# Patient Record
Sex: Male | Born: 1950 | Race: Black or African American | Hispanic: No | Marital: Single | Smoking: Former smoker
Health system: Southern US, Community
[De-identification: ages and names within clinical notes are randomized; demographics above are authoritative.]

## PROBLEM LIST (undated history)

## (undated) DIAGNOSIS — E43 Unspecified severe protein-calorie malnutrition: Secondary | ICD-10-CM

## (undated) DIAGNOSIS — I428 Other cardiomyopathies: Secondary | ICD-10-CM

## (undated) DIAGNOSIS — Z789 Other specified health status: Secondary | ICD-10-CM

## (undated) DIAGNOSIS — I340 Nonrheumatic mitral (valve) insufficiency: Secondary | ICD-10-CM

## (undated) DIAGNOSIS — F1090 Alcohol use, unspecified, uncomplicated: Secondary | ICD-10-CM

## (undated) DIAGNOSIS — Z720 Tobacco use: Secondary | ICD-10-CM

## (undated) DIAGNOSIS — R05 Cough: Secondary | ICD-10-CM

## (undated) DIAGNOSIS — C159 Malignant neoplasm of esophagus, unspecified: Secondary | ICD-10-CM

## (undated) DIAGNOSIS — Z7289 Other problems related to lifestyle: Secondary | ICD-10-CM

## (undated) DIAGNOSIS — I5022 Chronic systolic (congestive) heart failure: Secondary | ICD-10-CM

## (undated) DIAGNOSIS — K861 Other chronic pancreatitis: Secondary | ICD-10-CM

## (undated) DIAGNOSIS — IMO0002 Reserved for concepts with insufficient information to code with codable children: Secondary | ICD-10-CM

## (undated) DIAGNOSIS — I2510 Atherosclerotic heart disease of native coronary artery without angina pectoris: Secondary | ICD-10-CM

## (undated) DIAGNOSIS — R9430 Abnormal result of cardiovascular function study, unspecified: Secondary | ICD-10-CM

## (undated) DIAGNOSIS — J449 Chronic obstructive pulmonary disease, unspecified: Secondary | ICD-10-CM

## (undated) HISTORY — DX: Chronic obstructive pulmonary disease, unspecified: J44.9

## (undated) HISTORY — PX: OTHER SURGICAL HISTORY: SHX169

## (undated) HISTORY — DX: Other specified health status: Z78.9

## (undated) HISTORY — DX: Chronic systolic (congestive) heart failure: I50.22

## (undated) HISTORY — DX: Malignant neoplasm of esophagus, unspecified: C15.9

## (undated) HISTORY — DX: Cough: R05

## (undated) HISTORY — DX: Other problems related to lifestyle: Z72.89

## (undated) HISTORY — DX: Abnormal result of cardiovascular function study, unspecified: R94.30

## (undated) HISTORY — DX: Other chronic pancreatitis: K86.1

## (undated) HISTORY — DX: Other cardiomyopathies: I42.8

## (undated) HISTORY — DX: Tobacco use: Z72.0

## (undated) HISTORY — DX: Atherosclerotic heart disease of native coronary artery without angina pectoris: I25.10

## (undated) HISTORY — DX: Reserved for concepts with insufficient information to code with codable children: IMO0002

## (undated) HISTORY — DX: Nonrheumatic mitral (valve) insufficiency: I34.0

## (undated) HISTORY — DX: Alcohol use, unspecified, uncomplicated: F10.90

---

## 1999-02-23 ENCOUNTER — Encounter: Payer: Self-pay | Admitting: Emergency Medicine

## 1999-02-24 ENCOUNTER — Encounter: Payer: Self-pay | Admitting: Family Medicine

## 1999-02-24 ENCOUNTER — Inpatient Hospital Stay (HOSPITAL_COMMUNITY): Admission: EM | Admit: 1999-02-24 | Discharge: 1999-02-25 | Payer: Self-pay | Admitting: Emergency Medicine

## 1999-03-06 ENCOUNTER — Encounter: Admission: RE | Admit: 1999-03-06 | Discharge: 1999-03-06 | Payer: Self-pay | Admitting: Family Medicine

## 1999-03-22 ENCOUNTER — Encounter: Admission: RE | Admit: 1999-03-22 | Discharge: 1999-03-22 | Payer: Self-pay | Admitting: Family Medicine

## 1999-03-29 ENCOUNTER — Encounter: Admission: RE | Admit: 1999-03-29 | Discharge: 1999-03-29 | Payer: Self-pay | Admitting: Family Medicine

## 1999-04-23 ENCOUNTER — Encounter: Admission: RE | Admit: 1999-04-23 | Discharge: 1999-04-23 | Payer: Self-pay | Admitting: Family Medicine

## 1999-05-08 ENCOUNTER — Encounter: Admission: RE | Admit: 1999-05-08 | Discharge: 1999-05-08 | Payer: Self-pay | Admitting: Sports Medicine

## 1999-07-13 ENCOUNTER — Encounter: Admission: RE | Admit: 1999-07-13 | Discharge: 1999-07-13 | Payer: Self-pay | Admitting: Family Medicine

## 2000-06-11 ENCOUNTER — Encounter: Admission: RE | Admit: 2000-06-11 | Discharge: 2000-06-11 | Payer: Self-pay | Admitting: Family Medicine

## 2008-04-05 ENCOUNTER — Encounter: Admission: RE | Admit: 2008-04-05 | Discharge: 2008-04-05 | Payer: Self-pay | Admitting: Internal Medicine

## 2009-09-22 ENCOUNTER — Emergency Department (HOSPITAL_COMMUNITY): Admission: EM | Admit: 2009-09-22 | Discharge: 2009-09-22 | Payer: Self-pay | Admitting: Family Medicine

## 2009-09-29 ENCOUNTER — Emergency Department (HOSPITAL_COMMUNITY): Admission: EM | Admit: 2009-09-29 | Discharge: 2009-09-29 | Payer: Self-pay | Admitting: Family Medicine

## 2009-09-29 DIAGNOSIS — I428 Other cardiomyopathies: Secondary | ICD-10-CM

## 2009-09-29 HISTORY — DX: Other cardiomyopathies: I42.8

## 2009-09-29 HISTORY — PX: CARDIAC CATHETERIZATION: SHX172

## 2009-09-30 ENCOUNTER — Encounter: Payer: Self-pay | Admitting: Cardiology

## 2009-09-30 ENCOUNTER — Inpatient Hospital Stay (HOSPITAL_COMMUNITY): Admission: EM | Admit: 2009-09-30 | Discharge: 2009-10-04 | Payer: Self-pay | Admitting: Emergency Medicine

## 2009-09-30 ENCOUNTER — Ambulatory Visit: Payer: Self-pay | Admitting: Cardiology

## 2009-10-24 ENCOUNTER — Inpatient Hospital Stay (HOSPITAL_COMMUNITY)
Admission: EM | Admit: 2009-10-24 | Discharge: 2009-10-28 | Payer: Self-pay | Source: Home / Self Care | Admitting: Emergency Medicine

## 2009-10-30 ENCOUNTER — Telehealth: Payer: Self-pay | Admitting: Cardiology

## 2009-10-31 ENCOUNTER — Telehealth (INDEPENDENT_AMBULATORY_CARE_PROVIDER_SITE_OTHER): Payer: Self-pay | Admitting: *Deleted

## 2009-11-23 ENCOUNTER — Encounter: Payer: Self-pay | Admitting: Cardiology

## 2009-11-24 ENCOUNTER — Ambulatory Visit: Payer: Self-pay | Admitting: Cardiology

## 2009-12-15 ENCOUNTER — Ambulatory Visit: Payer: Self-pay | Admitting: Cardiology

## 2010-01-22 ENCOUNTER — Telehealth: Payer: Self-pay | Admitting: Cardiology

## 2010-01-22 ENCOUNTER — Encounter: Admission: RE | Admit: 2010-01-22 | Discharge: 2010-01-22 | Payer: Self-pay | Admitting: General Surgery

## 2010-02-13 ENCOUNTER — Ambulatory Visit: Payer: Self-pay | Admitting: Cardiology

## 2010-02-21 ENCOUNTER — Ambulatory Visit (HOSPITAL_COMMUNITY): Admission: RE | Admit: 2010-02-21 | Discharge: 2010-02-21 | Payer: Self-pay | Admitting: Gastroenterology

## 2010-03-12 ENCOUNTER — Encounter: Payer: Self-pay | Admitting: Cardiology

## 2010-03-13 ENCOUNTER — Telehealth (INDEPENDENT_AMBULATORY_CARE_PROVIDER_SITE_OTHER): Payer: Self-pay | Admitting: *Deleted

## 2010-03-31 DIAGNOSIS — E8770 Fluid overload, unspecified: Secondary | ICD-10-CM | POA: Insufficient documentation

## 2010-04-02 ENCOUNTER — Encounter: Payer: Self-pay | Admitting: Cardiology

## 2010-04-02 ENCOUNTER — Emergency Department (HOSPITAL_COMMUNITY): Admission: EM | Admit: 2010-04-02 | Discharge: 2010-04-02 | Payer: Self-pay | Admitting: Emergency Medicine

## 2010-04-05 ENCOUNTER — Telehealth: Payer: Self-pay | Admitting: Cardiology

## 2010-04-17 ENCOUNTER — Ambulatory Visit: Payer: Self-pay | Admitting: Cardiology

## 2010-04-19 ENCOUNTER — Telehealth (INDEPENDENT_AMBULATORY_CARE_PROVIDER_SITE_OTHER): Payer: Self-pay | Admitting: *Deleted

## 2010-04-23 ENCOUNTER — Telehealth: Payer: Self-pay | Admitting: Cardiology

## 2010-04-25 ENCOUNTER — Ambulatory Visit: Payer: Self-pay | Admitting: Cardiology

## 2010-04-25 ENCOUNTER — Telehealth: Payer: Self-pay | Admitting: Cardiology

## 2010-04-25 ENCOUNTER — Encounter: Payer: Self-pay | Admitting: Cardiology

## 2010-04-25 ENCOUNTER — Inpatient Hospital Stay (HOSPITAL_COMMUNITY)
Admission: EM | Admit: 2010-04-25 | Discharge: 2010-05-02 | Payer: Self-pay | Source: Home / Self Care | Admitting: Emergency Medicine

## 2010-04-26 ENCOUNTER — Encounter (INDEPENDENT_AMBULATORY_CARE_PROVIDER_SITE_OTHER): Payer: Self-pay | Admitting: Internal Medicine

## 2010-04-26 ENCOUNTER — Ambulatory Visit: Payer: Self-pay

## 2010-05-02 ENCOUNTER — Encounter: Payer: Self-pay | Admitting: Cardiology

## 2010-05-17 ENCOUNTER — Telehealth (INDEPENDENT_AMBULATORY_CARE_PROVIDER_SITE_OTHER): Payer: Self-pay | Admitting: *Deleted

## 2010-05-18 ENCOUNTER — Ambulatory Visit: Payer: Self-pay | Admitting: Cardiology

## 2010-06-08 ENCOUNTER — Telehealth: Payer: Self-pay | Admitting: Cardiology

## 2010-06-10 ENCOUNTER — Encounter: Payer: Self-pay | Admitting: Cardiology

## 2010-06-18 ENCOUNTER — Ambulatory Visit: Payer: Self-pay | Admitting: Cardiology

## 2010-06-21 ENCOUNTER — Encounter (INDEPENDENT_AMBULATORY_CARE_PROVIDER_SITE_OTHER): Payer: Self-pay | Admitting: *Deleted

## 2010-06-27 ENCOUNTER — Telehealth (INDEPENDENT_AMBULATORY_CARE_PROVIDER_SITE_OTHER): Payer: Self-pay | Admitting: *Deleted

## 2010-06-29 ENCOUNTER — Telehealth: Payer: Self-pay | Admitting: Cardiology

## 2010-07-01 DIAGNOSIS — R05 Cough: Secondary | ICD-10-CM | POA: Insufficient documentation

## 2010-07-01 DIAGNOSIS — R059 Cough, unspecified: Secondary | ICD-10-CM

## 2010-07-01 HISTORY — DX: Cough, unspecified: R05.9

## 2010-07-23 ENCOUNTER — Ambulatory Visit
Admission: RE | Admit: 2010-07-23 | Discharge: 2010-07-23 | Payer: Self-pay | Source: Home / Self Care | Attending: Cardiology | Admitting: Cardiology

## 2010-08-02 NOTE — Assessment & Plan Note (Signed)
Summary: Michael Keith   Visit Type:  Follow-up Primary Provider:  none  CC:  CHF.  History of Present Illness: The patient is seen for followup of chronic systolic CHF.  He actually looks better today than he has a long time.  He's gained from body weight.  He has been taking his medicines.  He has a cough that may be from the enalapril.  Current Medications (verified): 1)  Carvedilol 12.5 Mg Tabs (Carvedilol) .... Take One Tablet By Mouth Twice A Day 2)  Aspirin 81 Mg Tbec (Aspirin) .... Take One Tablet By Mouth Daily 3)  Enalapril Maleate 5 Mg Tabs (Enalapril Maleate) .... Take One Tablet By Mouth Twice A Day 4)  Nitrostat 0.4 Mg Subl (Nitroglycerin) .Marland Kitchen.. 1 Tablet Under Tongue At Onset of Chest Pain; You May Repeat Every 5 Minutes For Up To 3 Doses. 5)  Multivitamins   Tabs (Multiple Vitamin) .... Once Daily 6)  Furosemide 80 Mg Tabs (Furosemide) .... Take One Tablet By Mouth Two Times A Day 7)  Vitamin B-1 100 Mg Tabs (Thiamine Hcl) .... Once Daily 8)  Magnesium Oxide 400 Mg Tabs (Magnesium Oxide) .... Take 1 Tablet By Mouth Two Times A Day  Allergies (verified): No Known Drug Allergies  Past History:  Past Medical History:   Chronic pancreatitis.  CT scan shows improving pancreatitis. ..MCH.... treated at a hospital EF  35-40%..... echo.  [id number].Marland Kitchen Nonischemic cardiomyopathy...Marland KitchenMarland KitchenMarland Kitchen catheterization.... April, 2011 CHF....[facility]   April, 2011.[email or web address] [email or web address] troponin elevation at that time Mitral regurgitation   mild.... echo... April, 2011 Tobacco abuse Alcohol use..... heavy until January, 2011.Marland KitchenMarland Kitchenpatient was counseled concerning alcohol..MCH.Marland Kitchen April, 2011 COPD Cardiac catheterization.... April, 2011..... questionable occlusion of the most apical portion of the LAD... LV dysfunction out of proportion to coronary disease fluid overload   October, 2011 Cough    January, 2012... may be from enalapril.  Review of Systems       Patient denies fever, chills, headache, sweats, rash, change in  vision, change in hearing, chest pain, cough, nausea vomiting, urinary symptoms.  All of the systems are reviewed and are negative.  Vital Signs:  Patient profile:   60 year old male Height:      66 inches Weight:      105 pounds BMI:     17.01 Pulse rate:   85 / minute BP sitting:   112 / 66  (left arm) Cuff size:   regular  Vitals Entered By: Hardin Negus, RMA (July 23, 2010 3:51 PM)  Physical Exam  General:  patient looks better than usual today. Eyes:  no xanthelasma. Neck:  no jugular venous distention. Lungs:  lungs reveal a few scattered rhonchi. Heart:  cardiac exam reveals S1-S2.  No clicks or significant murmurs. Abdomen:  abdomen is soft. Extremities:  1+ peripheral edema. Psych:  patient is oriented to person time and place.  Affect is normal.   Impression & Recommendations:  Problem # 1:  COUGH (ICD-786.2) And is possible that his cough is related to his ACE inhibitor.  We will change him to low-dose Cozaar after his current medicines run out.  Problem # 2:  ALCOHOL ABUSE (ICD-305.00) The patient has stopped drinking completely for a prolonged period of time.  Problem # 3:  CONGESTIVE HEART FAILURE, UNSPECIFIED (ICD-428.0)  His updated medication list for this problem includes:    Carvedilol 12.5 Mg Tabs (Carvedilol) .Marland Kitchen... Take one tablet by mouth twice a day    Aspirin 81 Mg Tbec (Aspirin) .Marland Kitchen... Take one tablet by mouth  daily    Enalapril Maleate 5 Mg Tabs (Enalapril maleate) .Marland Kitchen... Take one tablet by mouth twice a day    Nitrostat 0.4 Mg Subl (Nitroglycerin) .Marland Kitchen... 1 tablet under tongue at onset of chest pain; you may repeat every 5 minutes for up to 3 doses.    Furosemide 80 Mg Tabs (Furosemide) .Marland Kitchen... Take one tablet by mouth two times a day    Cozaar 50 Mg Tabs (Losartan potassium) .Marland Kitchen... Take one tablet by mouth daily The patient's overall volume status is stable.  No change in therapy.  Patient Instructions: 1)  Your physician has recommended you  make the following change in your medication: start Cozaar (Losartan) 50mg  once daily after finishing enalapril 2)  Your physician recommends that you schedule a follow-up appointment in: 6 months Prescriptions: COZAAR 50 MG TABS (LOSARTAN POTASSIUM) Take one tablet by mouth daily  #90 x 2   Entered by:   Meredith Staggers, RN   Authorized by:   Talitha Givens, MD, [facility]   Signed by:   Meredith Staggers, RN on 07/23/2010   Method used:   Print then Give to Patient   RxID:   [id number]

## 2010-08-02 NOTE — Assessment & Plan Note (Signed)
Summary: per check out/sf   Visit Type:  post hospitalization Primary Provider:  Debarah Crape, MD  CC:  cardiomyopathy.  History of Present Illness: The patient is seen post hospitalization.  He was admitted with acute on chronic systolic heart failure.  He was managed well.  I have reviewed all the hospital records.  He has severe left ventricular dysfunction.  He also has a problem with malnutrition.  His ejection fraction is in the range of 15%.  Very careful plans for made with his discharge medicines.  Keeping him on these medicines his critical in our attempts to keep him stable and out of the hospital.  He says that home health has helped him but that he does not have any medicines to take for tomorrow.  Allergies (verified): No Known Drug Allergies  Past History:  Past Medical History: Last updated: 04/17/2010   Chronic pancreatitis.  CT scan shows improving pancreatitis. ..MCH.... treated at a hospital EF  35-40%..... echo.  [id number].Marland Kitchen Nonischemic cardiomyopathy...Marland KitchenMarland KitchenMarland Kitchen catheterization.... April, 2011 and aa CHF....[facility]   April, 2011.[email or web address] [email or web address] troponin elevation at that time Mitral regurgitation   mild.... echo... April, 2011 Tobacco abuse Alcohol use..... heavy until January, 2011.Marland KitchenMarland Kitchenpatient was counseled concerning alcohol..MCH.Marland Kitchen April, 2011 COPD Cardiac catheterization.... April, 2011..... questionable occlusion of the most apical portion of the LAD... LV dysfunction out of proportion to coronary disease fluid overload   October, 2011  Review of Systems       Patient denies fever, chills, headache, sweats, rash, change in vision, change in hearing, chest pain, cough, nausea vomiting, urinary symptoms.  All the systems are reviewed and are negative.  Vital Signs:  Patient profile:   60 year old male Height:      66 inches Weight:      94 pounds BMI:     15.23 Pulse rate:   65 / minute BP sitting:   94 / 68  (left arm) Cuff size:   regular  Vitals Entered By:  Hardin Negus, RMA (May 18, 2010 11:45 AM)  Physical Exam  General:  patient is cachectic but stable Head:  . Eyes:  no xanthelasma. Neck:  no jugular venous distention. Lungs:  lungs are clear cardiorespiratory effort is not labored. Heart:  cardiac exam reveals S1-S2.  No clicks or significant murmurs. Abdomen:  abdomen is soft. Msk:  patient is thin. Extremities:  no peripheral edema. Psych:  patient is oriented to person, place.  Affect is normal.   Impression & Recommendations:  Problem # 1:  CARDIOMYOPATHY (ICD-425.4)  His updated medication list for this problem includes:    Metoprolol Succinate 25 Mg Xr24h-tab (Metoprolol succinate) .Marland Kitchen... Take one tablet by mouth daily    Aspirin 81 Mg Tbec (Aspirin) .Marland Kitchen... Take one tablet by mouth daily    Enalapril Maleate 5 Mg Tabs (Enalapril maleate) .Marland Kitchen... Take one tablet by mouth twice a day    Nitrostat 0.4 Mg Subl (Nitroglycerin) .Marland Kitchen... 1 tablet under tongue at onset of chest pain; you may repeat every 5 minutes for up to 3 doses.    Furosemide 40 Mg Tabs (Furosemide) .Marland Kitchen... Take one tablet by mouth daily. The patient has a severe cardiopathy.  Being on his medications is crucial.  We will do everything we can today to try to be sure of that home health is working with him.  Problem # 2:  CONGESTIVE HEART FAILURE, UNSPECIFIED (ICD-428.0)  His updated medication list for this problem includes:    Metoprolol Succinate 25 Mg Xr24h-tab (Metoprolol succinate) .Marland Kitchen... Take  one tablet by mouth daily    Aspirin 81 Mg Tbec (Aspirin) .Marland Kitchen... Take one tablet by mouth daily    Enalapril Maleate 5 Mg Tabs (Enalapril maleate) .Marland Kitchen... Take one tablet by mouth twice a day    Nitrostat 0.4 Mg Subl (Nitroglycerin) .Marland Kitchen... 1 tablet under tongue at onset of chest pain; you may repeat every 5 minutes for up to 3 doses.    Furosemide 40 Mg Tabs (Furosemide) .Marland Kitchen... Take one tablet by mouth daily. His volume status is stable today.  No change in  therapy.  Patient Instructions: 1)  Follow up in 6 weeks

## 2010-08-02 NOTE — Progress Notes (Signed)
Summary: fluid in legs and feet  Phone Note Call from Patient Call back at Home Phone [phone] Call back at [phone]   Caller: Onalee Hua Summary of Call: Fluid legs and feet Initial call taken by: Judie Grieve,  April 23, 2010 1:45 PM  Follow-up for Phone Call        Pt.'s ankles and legs are swollen bilaterally. Pt. is not c/o any SOB/cp.  Pt. 99lb.last week & is not able to stand and weigh himself at home. Pt. can not bear any weight on his legs at the current time. Spoke to Dr.Allred. He wants pt. to take 40 mg of Lasix two times a day for three days & to decrease his salt/fluid intake. Pt. will go to the ER if he starts to become SOB. He will come to the office on Thursday for a BMP & I will forward this to Dr. Henrietta Hoover RN to see if they can add him on for this Thursday afternoon.  Whitney Maeola Sarah RN  April 23, 2010 2:52 PM  Follow-up by: Whitney Maeola Sarah RN,  April 23, 2010 2:01 PM

## 2010-08-02 NOTE — Progress Notes (Signed)
  DDS request received sent to [facility]  May 17, 2010 10:04 AM     Appended Document:  pt also signed ROI,sent to [facility]

## 2010-08-02 NOTE — Letter (Signed)
Summary: [facility]  [facility]   Imported By: Roderic Ovens 05/17/2010 12:22:08  _____________________________________________________________________  External Attachment:    Type:   Image     Comment:   External Document

## 2010-08-02 NOTE — Assessment & Plan Note (Signed)
Summary: rov   Visit Type:  Follow-up Primary Cortez Steelman:  [name]  CC:  fluid overload.  History of Present Illness: The patient is seen for cardiology followup.  I received a call if you've been in the emergency room last week.  He had some mild fluid overload at that time.  Since being at home he has not had chest pain.  However he now has significant swelling in his feet.  He may have mild orthopnea.  He does have decreased ejection fraction by echo in April, 2011.  Current Medications (verified): 1)  Metoprolol Succinate 25 Mg Xr24h-Tab (Metoprolol Succinate) .... Take One Tablet By Mouth Daily 2)  Aspirin 81 Mg Tbec (Aspirin) .... Take One Tablet By Mouth Daily 3)  Enalapril Maleate 5 Mg Tabs (Enalapril Maleate) .... Take One Tablet By Mouth Twice A Day 4)  Nitrostat 0.4 Mg Subl (Nitroglycerin) .Marland Kitchen.. 1 Tablet Under Tongue At Onset of Chest Pain; You May Repeat Every 5 Minutes For Up To 3 Doses. 5)  Multivitamins   Tabs (Multiple Vitamin) .... Once Daily 6)  Zenpep 20000 Unit Cpep (Pancrelipase (Lip-Prot-Amyl)) .... 2 Caps Three Times A Day  Allergies (verified): No Known Drug Allergies  Past History:  Past Medical History:   Chronic pancreatitis.  CT scan shows improving pancreatitis. ..MCH.... treated at a hospital EF  35-40%..... echo.  [id number].Marland Kitchen Nonischemic cardiomyopathy...Marland KitchenMarland KitchenMarland Kitchen catheterization.... April, 2011 and aa CHF....[facility]   April, 2011.[email or web address] [email or web address] troponin elevation at that time Mitral regurgitation   mild.... echo... April, 2011 Tobacco abuse Alcohol use..... heavy until January, 2011.Marland KitchenMarland Kitchenpatient was counseled concerning alcohol..MCH.Marland Kitchen April, 2011 COPD Cardiac catheterization.... April, 2011..... questionable occlusion of the most apical portion of the LAD... LV dysfunction out of proportion to coronary disease fluid overload   October, 2011  Review of Systems       Patient denies fever, chills, headache, sweats, rash, change in vision, change in hearing, chest  pain, cough, nausea vomiting, urinary symptoms.  All other systems are reviewed and are negative.  Vital Signs:  Patient profile:   60 year old male Height:      66 inches Weight:      99 pounds BMI:     16.04 Pulse rate:   96 / minute BP sitting:   108 / 70  (right arm) Cuff size:   regular  Vitals Entered By: Hardin Negus, RMA (April 17, 2010 10:28 AM)  Physical Exam  General:  patient is thin and frail but stable. Head:  head is atraumatic. Eyes:  normal extraocular motion. Neck:  no jugular venous distention. Chest Wall:  no chest wall tenderness. Lungs:  lungs reveal scattered rhonchi. Heart:  cardiac exam reveals an S1-S2.  There is a soft systolic murmur. Abdomen:  abdomen is soft. Msk:  no areas of trauma. Extremities:  patient has 2+ edema localized to his feet. Skin:  no skin rashes. Psych:  patient is oriented to person time and place.  Affect is normal.   Impression & Recommendations:  Problem # 1:  * OCCLUSION APICAL LAD  ???????  SMALL The patient is not having chest pain.  No further workup.  Problem # 2:  ALCOHOL ABUSE (ICD-305.00) The patient is not using alcohol at this time.  Problem # 3:  CARDIOMYOPATHY (ICD-425.4)  His updated medication list for this problem includes:    Metoprolol Succinate 25 Mg Xr24h-tab (Metoprolol succinate) .Marland Kitchen... Take one tablet by mouth daily    Aspirin 81 Mg Tbec (Aspirin) .Marland Kitchen... Take one tablet by mouth daily  Enalapril Maleate 5 Mg Tabs (Enalapril maleate) .Marland Kitchen... Take one tablet by mouth twice a day    Nitrostat 0.4 Mg Subl (Nitroglycerin) .Marland Kitchen... 1 tablet under tongue at onset of chest pain; you may repeat every 5 minutes for up to 3 doses.    Furosemide 40 Mg Tabs (Furosemide) .Marland Kitchen... Take one tablet by mouth daily. The patient does have decreased ejection fraction.  He is on appropriate medications at this point.  Problem # 4:  CONGESTIVE HEART FAILURE, UNSPECIFIED (ICD-428.0)  His updated medication list for this  problem includes:    Metoprolol Succinate 25 Mg Xr24h-tab (Metoprolol succinate) .Marland Kitchen... Take one tablet by mouth daily    Aspirin 81 Mg Tbec (Aspirin) .Marland Kitchen... Take one tablet by mouth daily    Enalapril Maleate 5 Mg Tabs (Enalapril maleate) .Marland Kitchen... Take one tablet by mouth twice a day    Nitrostat 0.4 Mg Subl (Nitroglycerin) .Marland Kitchen... 1 tablet under tongue at onset of chest pain; you may repeat every 5 minutes for up to 3 doses.    Furosemide 40 Mg Tabs (Furosemide) .Marland Kitchen... Take one tablet by mouth daily. currently he is not on a diuretic.  This will be started and we'll see him for followup.  His labs will have to be rechecked.  I have reviewed his emergency room records.  I has spoken with the gentleman who is here with him.  Patient Instructions: 1)  Start Furosemide 40mg  daily 2)  Follow up in 3 weeks Prescriptions: FUROSEMIDE 40 MG TABS (FUROSEMIDE) Take one tablet by mouth daily.  #30 x 6   Entered by:   Meredith Staggers, RN   Authorized by:   Talitha Givens, MD, [facility]   Signed by:   Meredith Staggers, RN on 04/17/2010   Method used:   Electronically to        [facility] [address] (retail)       [address]       [place], [place]  [id number]       Ph: [phone]       Fax: [phone]   RxID:   [id number]

## 2010-08-02 NOTE — Progress Notes (Signed)
Summary: Home Health  Phone Note From Other Clinic Call back at [phone]   Caller: [facility] Summary of Call: needs order for skilled nursing visit & Aide, states dc papers state Dr Myrtis Ser is MD Initial call taken by: Migdalia Dk,  Oct 30, 2009 4:02 PM  Follow-up for Phone Call        spoke w/Mary Waynetta Sandy gave ok for nurse and aid, she states pt is missing some meds and is unsure about chf diagnosis Meredith Staggers, RN  Oct 30, 2009 4:36 PM

## 2010-08-02 NOTE — Letter (Signed)
Summary: [place] Physician Documentation Sheet   [facility] Physician Documentation Sheet   Imported By: Roderic Ovens 04/24/2010 09:17:38  _____________________________________________________________________  External Attachment:    Type:   Image     Comment:   External Document

## 2010-08-02 NOTE — Assessment & Plan Note (Signed)
Summary: per check out/sf   Visit Type:  Follow-up Primary Provider:  [name]  CC:  cardiomyopathy.  History of Present Illness: Patient is seen in followup of cardiomyopathy.  Catheterization had been done in April, 2011.  There was question of occlusion of the most apical portion of the LAD.  There is significant left ventricular dysfunction out of proportion to the coronary disease.  Ejection fraction in the hospital had been in the 35-40% range.  When I saw the patient on Nov 24, 2009 I did try to titrate his medications hoping to increase his beta blocker.  His blood pressure was 110 systolic at that time.  Since then he has run out of his furosemide, metoprolol, aspirin, iron, Prilosec.  It appears that he is taking potassium, enalapril, folic acid, multivitamin.  He says he feels weak.  His blood pressure is low today.  He does not appear to be wet despite the fact that he has run out of Lasix.  It may be that some of his symptoms are related to low blood pressure.  Current Medications (verified): 1)  Furosemide 40 Mg Tabs (Furosemide) .... Take One Tablet By Mouth Daily. 2)  Metoprolol Tartrate 50 Mg Tabs (Metoprolol Tartrate) .... Take One Tablet By Mouth Twice A Day 3)  Potassium Chloride Crys Cr 20 Meq Cr-Tabs (Potassium Chloride Crys Cr) .... Take One Tablet By Mouth Daily 4)  Aspirin 81 Mg Tbec (Aspirin) .... Take One Tablet By Mouth Daily 5)  Enalapril Maleate 10 Mg Tabs (Enalapril Maleate) .... Take One Tablet By Mouth Twice A Day 6)  Nitrostat 0.4 Mg Subl (Nitroglycerin) .Marland Kitchen.. 1 Tablet Under Tongue At Onset of Chest Pain; You May Repeat Every 5 Minutes For Up To 3 Doses. 7)  Folic Acid 1 Mg Tabs (Folic Acid) .... Once Daily 8)  Ferrous Sulfate 325 (65 Fe) Mg  Tabs (Ferrous Sulfate) .... Once Daily 9)  Multivitamins   Tabs (Multiple Vitamin) .... Once Daily 10)  Prilosec 40 Mg Cpdr (Omeprazole) .... Once Daily 11)  Ex-Lax 15 Mg Tabs (Sennosides) .... As Needed 12)  Vitamin B-1  100 Mg Tabs (Thiamine Hcl) .... Take One Tablet By Mouth Once Daily.  Allergies (verified): No Known Drug Allergies  Past History:  Past Surgical History: Last updated: 11/14/2009 wrist -- Left  Family History: Last updated: 2006/09/23 Mother died of Cancer at young age. Father`s med hx., Unknown. One healthy brother. Cousins with DM.  Social History: Last updated: 11/14/2009 Single  Tobacco Use - Yes.  Alcohol Use - yes -- heavy till Jan '11 Drug Use - no  Risk Factors: Smoking Status: current (11/14/2009)  Past Medical History:   Chronic pancreatitis.  CT scan shows improving pancreatitis. ..MCH.... treated at a hospital EF  35-40%..... echo.  [id number].Marland Kitchen Nonischemic cardiomyopathy...Marland KitchenMarland KitchenMarland Kitchen catheterization.... April, 2011 and aa CHF....[facility]   April, 2011.[email or web address] [email or web address] troponin elevation at that time Mitral regurgitation   mild.... echo... April, 2011 Tobacco abuse Alcohol use..... heavy until January, 2011.Marland KitchenMarland Kitchenpatient was counseled concerning alcohol..MCH.Marland Kitchen April, 2011 COPD Cardiac catheterization.... April, 2011..... questionable occlusion of the most apical portion of the LAD... LV dysfunction out of proportion to coronary disease  Review of Systems       Patient denies fever, chills, headache, sweats, rash, change in vision, change in hearing, chest pain, nausea vomiting, urinary symptoms.  He says that he has mild nasal discharge from the right nares intermittently.  All the systems are reviewed and are negative.  Vital Signs:  Patient profile:   60 year  old male Height:      66 inches Weight:      87 pounds BMI:     14.09 Pulse rate:   88 / minute Pulse rhythm:   regular BP sitting:   90 / 72  (left arm) Cuff size:   regular  Vitals Entered By: Stanton Kidney, EMT-P (December 15, 2009 1:36 PM)  Physical Exam  General:  in general he is thin and weak appearing Eyes:  no xanthelasma. Neck:  no jugular venous distention. Lungs:  lungs reveal scattered rhonchi. Heart:   cardiac exam reveals S1-S2.  There is a soft systolic murmur. Abdomen:  abdomen is soft. Extremities:  no significant peripheral edema. Psych:  patient is oriented to person time and place.   Impression & Recommendations:  Problem # 1:  * OCCLUSION APICAL LAD  ???????  SMALL No further workup of his coronary disease is needed.  Problem # 2:  ALCOHOL ABUSE (ICD-305.00) I am hopeful that by remaining off alcohol and he will improve in general.  Problem # 3:  TOBACCO ABUSE (ICD-305.1) Unfortunately he continues to smoke and we counseled him to stop.  Problem # 4:  CARDIOMYOPATHY (ICD-425.4) It is possible that as we attempt to treat the ejection fraction seen in the hospital that he is feeling poorly from some of the medicines.  In this case I feel it is most prudent to back off and simplify his meds and allow his blood pressure to drift up at least into the range of 110 systolic.  It appears to me that he may not need ongoing furosemide.  Therefore furosemide will be stopped.  Metoprolol will be continued at 25 mg of metoprolol succinate daily.  Potassium he stopped.  Aspirin will be continued.  Enalapril will be decreased to 5 mg b.i.d.  Folic acid will be continued.  Continuation of any other medications will be up to his primary physician.  I will see him back for cardiology followup.  Patient Instructions: 1)  Stop Furosemide 2)  Stop Potassium 3)  Stop Metoprolol Tartrate  4)  Start Metoprolol Succinate 25mg  once daily  5)  Decrease Enalapril to 5mg  two times a day  6)  Restart Aspirin 81mg  once daily  7)  Continue Folic Acid 8)  All other medications can be stopped when they run out 9)  Follow up in 6 weeks Prescriptions: ENALAPRIL MALEATE 5 MG TABS (ENALAPRIL MALEATE) Take one tablet by mouth twice a day  #60 x 12   Entered by:   Meredith Staggers, RN   Authorized by:   Talitha Givens, MD, [facility]   Signed by:   Meredith Staggers, RN on 12/15/2009   Method used:   Electronically to          [facility] (retail)       [address]       [place], [place]  [id number]       Ph: [phone]       Fax: [phone]   RxID:   [id number] METOPROLOL SUCCINATE 25 MG XR24H-TAB (METOPROLOL SUCCINATE) Take one tablet by mouth daily  #30 x 12   Entered by:   Meredith Staggers, RN   Authorized by:   Talitha Givens, MD, [facility] [facility]   Signed by:   Meredith Staggers, RN on 12/15/2009   Method used:   Electronically to        [facility] (retail)       [address]       [place], [place]  [id number]       Ph: [phone]       Fax: [phone]   RxID:   [id number]

## 2010-08-02 NOTE — Letter (Signed)
Summary: Appointment - Missed  [facility], Main Office  [address]   [place], [place] [address]   Phone: [phone]  Fax: [phone]     June 21, 2010 MRN: [id number]   MORTY ORTWEIN [address] [place], [place]  [address]   Dear Mr. Scarberry,  Our records indicate you missed your appointment on  06/18/10 with Dr. Myrtis Ser .It is very important that we reach you to reschedule this appointment. We look forward to participating in your health care needs. Please contact us at the number listed above at your earliest convenience to reschedule this appointment.     Sincerely,  Artist

## 2010-08-02 NOTE — Letter (Signed)
Summary: ER Notification  Architectural technologist, Main Office  [address]   [place], [place] [address]   Phone: [phone]  Fax: [phone]    April 25, 2010 10:22 AM  [place] KENTNER  The above referenced patient has been advised to report directly to the Emergency Room. Please see below for more information:  Dx: LE edema. (pt has doubled lasix for the past few days & still can't bear weight on his legs.)   Private Vehicle  _____X________ or EMS:  ________________   Orders:  Yes ______ or No  ____X___   Notify upon arrival:     Trish [phone]- done       Thank you,    Forensic psychologist

## 2010-08-02 NOTE — Assessment & Plan Note (Signed)
Summary: Michael Keith   Visit Type:  Follow-up Primary Provider:  [name]   History of Present Illness: The patient is seen post hospitalization followup left ventricular dysfunction.  He has chronic pancreatitis.  Is not able to gain weight.  When he was hospitalized recently he had some troponin elevation area his ejection fraction is 35-40%.  Cardiac catheterization was done there he there was question of an occlusion of the most apical aspect of the LAD.  Left ventricular dysfunction was out of proportion to the coronary disease.  Patient returns today feeling generally poorly.  His cardiac status appears to be stable.he says that he has edema.  Current Medications (verified): 1)  Furosemide 40 Mg Tabs (Furosemide) .... Take One Tablet By Mouth Daily. 2)  Metoprolol Tartrate 25 Mg Tabs (Metoprolol Tartrate) .... Take One Tablet By Mouth Twice A Day 3)  Potassium Chloride Crys Cr 20 Meq Cr-Tabs (Potassium Chloride Crys Cr) .... Take One Tablet By Mouth Daily 4)  Aspirin 81 Mg Tbec (Aspirin) .... Take One Tablet By Mouth Daily 5)  Enalapril Maleate 10 Mg Tabs (Enalapril Maleate) .... Take One Tablet By Mouth Twice A Day 6)  Nitrostat 0.4 Mg Subl (Nitroglycerin) .Marland Kitchen.. 1 Tablet Under Tongue At Onset of Chest Pain; You May Repeat Every 5 Minutes For Up To 3 Doses. 7)  Folic Acid 1 Mg Tabs (Folic Acid) .... Once Daily 8)  Ferrous Sulfate 325 (65 Fe) Mg  Tabs (Ferrous Sulfate) .... Once Daily 9)  Multivitamins   Tabs (Multiple Vitamin) .... Once Daily 10)  Prilosec 40 Mg Cpdr (Omeprazole) .... Once Daily 11)  Ex-Lax 15 Mg Tabs (Sennosides) .... As Needed 12)  Vitamin B-1 100 Mg Tabs (Thiamine Hcl) .... Take One Tablet By Mouth Once Daily.  Allergies (verified): No Known Drug Allergies  Past History:  Past Medical History: Last updated: 11/23/2009   Chronic pancreatitis.  CT scan shows improving pancreatitis. ..MCH.... treated at a hospital EF  35-40%..... echo.  [id number].Marland Kitchen Nonischemic  cardiomyopathy...Marland KitchenMarland KitchenMarland Kitchen catheterization.... April, 2011 and aa CHF....[facility]   April, 2011.[email or web address] [email or web address] troponin elevation at that time Mitral regurgitation   mild.... echo... April, 2011 Tobacco abuse Alcohol use..... heavy until January, 2011.Marland KitchenMarland Kitchenpatient was counseled concerning alcohol..MCH.Marland Kitchen April, 2011 COPD Cardiac catheterization.... April, 2011..... questionable occlusion of the most apical portion of the LAD... LV dysfunction out of proportion to coronary disease  Review of Systems       Patient denies fever, chills, headache, sweats, rash, change in vision, change in hearing, chest pain, nausea vomiting, urinary symptoms.  He does have some arthritic symptoms.All of the systems are reviewed and are negative.  Vital Signs:  Patient profile:   60 year old male Height:      66 inches Weight:      91 pounds BMI:     14.74 Pulse rate:   109 / minute BP sitting:   98 / 52  (left arm)  Vitals Entered By: Laurance Flatten CMA (Nov 24, 2009 1:56 PM)  Physical Exam  General:  patient is thin but stable. Eyes:  no xanthelasma. Neck:  no jugular venous distention. Lungs:  lungs are clear.  Respiratory effort is nonlabored. Heart:  cardiac exam reveals S1 and S2.  No clicks or significant murmurs.  His heart rate is increased. Abdomen:  abdomen is soft. Extremities:  trace peripheral edema in the right foot. Psych:  patient is discouraged that he is not doing any better he   Impression & Recommendations:  Problem # 1:  * OCCLUSION APICAL  LAD  ???????  SMALL The patient may have minimal coronary disease.  He does not need further workup.  Problem # 2:  COPD (ICD-496) There is significant COPD.  He says he is not smoking at this time.  Problem # 3:  ALCOHOL ABUSE (ICD-305.00) Alcohol is clearly played a negative roll for him over time.  He says he is not drinking at this time.  Problem # 4:  CARDIOMYOPATHY (ICD-425.4) Meds need to be titrated further.  He needs a higher dose of beta blocker.   His primary physician he recommended increasing the dose.  The patient chose not to increase the dose but has agreed to today.  Problem # 5:  * CHRONIC PANCREATITIS patient says his status is stable.  He is not having abdominal pain.  I suspect that his pancreatic problems did play a role with his ability to gain weight.  Other Orders: EKG w/ Interpretation (93000)  Patient Instructions: 1)  Increase Metoprolol to 50mg  two times a day  2)  Follow up in 3 weeks

## 2010-08-02 NOTE — Assessment & Plan Note (Signed)
Summary: per check out/sf   Visit Type:  Follow-up Primary Michael Keith:  [name]  CC:  cardiomyopathy.  History of Present Illness: The patient is seen for followup of cardiomyopathy.  He is actually doing well.  When I saw him December 15, 2009 he was not feeling well with all of the medications.  I stopped his diuretic in cutdown the dose of his ACE inhibitor.  He is feeling relatively well.  He has run out of his folic acid and iron.  I told him that we would not know if he needs these unless the blood tests obtained.  He wants to follow with his primary team.  Current Medications (verified): 1)  Metoprolol Succinate 25 Mg Xr24h-Tab (Metoprolol Succinate) .... Take One Tablet By Mouth Daily 2)  Aspirin 81 Mg Tbec (Aspirin) .... Take One Tablet By Mouth Daily 3)  Enalapril Maleate 5 Mg Tabs (Enalapril Maleate) .... Take One Tablet By Mouth Twice A Day 4)  Nitrostat 0.4 Mg Subl (Nitroglycerin) .Marland Kitchen.. 1 Tablet Under Tongue At Onset of Chest Pain; You May Repeat Every 5 Minutes For Up To 3 Doses. 5)  Folic Acid 1 Mg Tabs (Folic Acid) .... Once Daily 6)  Ferrous Sulfate 325 (65 Fe) Mg  Tabs (Ferrous Sulfate) .... Once Daily 7)  Multivitamins   Tabs (Multiple Vitamin) .... Once Daily 8)  Prilosec 40 Mg Cpdr (Omeprazole) .... Once Daily 9)  Ex-Lax 15 Mg Tabs (Sennosides) .... As Needed 10)  Vitamin B-1 100 Mg Tabs (Thiamine Hcl) .... Take One Tablet By Mouth Once Daily. 11)  Zenpep 20000 Unit Cpep (Pancrelipase (Lip-Prot-Amyl)) .... 2 Caps Three Times A Day  Allergies (verified): No Known Drug Allergies  Past History:  Past Medical History: Last updated: 12/15/2009   Chronic pancreatitis.  CT scan shows improving pancreatitis. ..MCH.... treated at a hospital EF  35-40%..... echo.  [id number].Marland Kitchen Nonischemic cardiomyopathy...Marland KitchenMarland KitchenMarland Kitchen catheterization.... April, 2011 and aa CHF....[facility]   April, 2011.[email or web address] [email or web address] troponin elevation at that time Mitral regurgitation   mild.... echo... April, 2011 Tobacco  abuse Alcohol use..... heavy until January, 2011.Marland KitchenMarland Kitchenpatient was counseled concerning alcohol..MCH.Marland Kitchen April, 2011 COPD Cardiac catheterization.... April, 2011..... questionable occlusion of the most apical portion of the LAD... LV dysfunction out of proportion to coronary disease  Review of Systems       The patient denies fever, chills, sweats, rash, change in vision, change in hearing, chest pain, cough, nausea vomiting, urinary symptoms.  All of the systems are reviewed and are negative.  Vital Signs:  Patient profile:   60 year old male Height:      66 inches Weight:      91 pounds BMI:     14.74 Pulse rate:   85 / minute BP sitting:   114 / 74  (left arm) Cuff size:   regular  Vitals Entered By: Hardin Negus, RMA (February 13, 2010 2:30 PM)  Physical Exam  General:  patient is thin but stable. Eyes:  no xanthelasma. Neck:  no jugular venous stenting. Lungs:  lungs are clear.  Respiratory effort is nonlabored. Heart:  cardiac exam reveals S1-S2.  No clicks or significant murmurs. Abdomen:  abdomen is soft. Extremities:  no peripheral edema. Psych:  patient is oriented to person time and place.  Affect is normal.   Impression & Recommendations:  Problem # 1:  * OCCLUSION APICAL LAD  ???????  SMALL coronary status is stable.  No further workup.  Problem # 2:  ALCOHOL ABUSE (ICD-305.00) The patient tells me that he is stop  drinking completely.  This would be excellent for his health.  Problem # 3:  TOBACCO ABUSE (ICD-305.1) The patient does continue to smoke.  I have counseled him to stop.  Problem # 4:  CARDIOMYOPATHY (ICD-425.4)  His updated medication list for this problem includes:    Metoprolol Succinate 25 Mg Xr24h-tab (Metoprolol succinate) .Marland Kitchen... Take one tablet by mouth daily    Aspirin 81 Mg Tbec (Aspirin) .Marland Kitchen... Take one tablet by mouth daily    Enalapril Maleate 5 Mg Tabs (Enalapril maleate) .Marland Kitchen... Take one tablet by mouth twice a day    Nitrostat 0.4 Mg Subl  (Nitroglycerin) .Marland Kitchen... 1 tablet under tongue at onset of chest pain; you may repeat every 5 minutes for up to 3 doses. The patient's resting heart rate is higher than I would like to see.  Clinically he is doing well.  We will increase his metoprolol succinate 50 mg daily.  Patient Instructions: 1)  Your physician wants you to follow-up in:  6 months.  You will receive a reminder letter in the mail two months in advance. If you don't receive a letter, please call our office to schedule the follow-up appointment. Prescriptions: ENALAPRIL MALEATE 5 MG TABS (ENALAPRIL MALEATE) Take one tablet by mouth twice a day  #60 x 12   Entered by:   Meredith Staggers, RN   Authorized by:   Talitha Givens, MD, [facility]   Signed by:   Meredith Staggers, RN on 02/13/2010   Method used:   Electronically to        [facility] [address] [facility] (retail)       [address]       [place], [place]  [id number]       Ph: [phone]       Fax: [phone]   RxID:   [id number] METOPROLOL SUCCINATE 25 MG XR24H-TAB (METOPROLOL SUCCINATE) Take one tablet by mouth daily  #30 x 12   Entered by:   Meredith Staggers, RN   Authorized by:   Talitha Givens, MD, [facility]   Signed by:   Meredith Staggers, RN on 02/13/2010   Method used:   Electronically to        [facility] (retail)       [address]       [place], [place]  [id number]       Ph: [phone]       Fax: [phone]   RxID:   [id number]

## 2010-08-02 NOTE — Progress Notes (Signed)
Summary: talk to nurse  Phone Note From Other Clinic   Caller: Nurse Summary of Call: Per Neysa Bonito pt primary care Dr has been giving orders for home health to go out and fill his medication box. HOwever, The home health nurse cant get a new order from him and wants to lknow if Dr Myrtis Ser would send orders [phone]  Initial call taken by: Edman Circle,  June 08, 2010 11:07 AM  Follow-up for Phone Call        Called Christy [facility] Nurse) and advised her that it is OK to complete medication box for this patient in order to help with compliance. She will fax an order for Dr.Katz to sign and send back to her.  Layne Benton, RN, BSN  June 08, 2010 3:29 PM

## 2010-08-02 NOTE — Progress Notes (Signed)
Summary: ER visit  Phone Note Outgoing Call   Summary of Call: Received note from ER that pt was seen in ER on April 02, 2010.  I called pt to see how he is doing. He states his stomach is "weak" and that he is nauseated but no vomiting.  Feet/ankles are swollen but swelling has not increased in last several months. No chest pain. SOB has improved since visit to ER. Discussed with Dr. Myrtis Ser and he would like to see pt in office in week to 10 days.  Office visit scheduled for October 18th at 11:45.

## 2010-08-02 NOTE — Progress Notes (Signed)
  DDS request received sent to [facility]  April 19, 2010 3:11 PM

## 2010-08-02 NOTE — Progress Notes (Signed)
Summary: verbal order  Phone Note From Other Clinic Call back at [phone]   Caller: [facility] Summary of Call: verbal order for 3in 1 commode and tub sit Initial call taken by: Migdalia Dk,  Oct 31, 2009 1:36 PM  Follow-up for Phone Call        per Dr Myrtis Ser ok, called and left mess ok Meredith Staggers, RN  Nov 02, 2009 2:36 PM

## 2010-08-02 NOTE — Miscellaneous (Signed)
  Clinical Lists Changes  Problems: Added new problem of * CHRONIC PANCREATITIS Added new problem of CARDIOMYOPATHY (ICD-425.4) Added new problem of MITRAL REGURGITATION (ICD-396.3) Added new problem of TOBACCO ABUSE (ICD-305.1) Added new problem of ALCOHOL ABUSE (ICD-305.00) Added new problem of COPD (ICD-496) Added new problem of * OCCLUSION APICAL LAD  ???????  SMALL Observations: Added new observation of PAST MED HX:   Chronic pancreatitis.  CT scan shows improving pancreatitis. ..MCH.... treated at a hospital EF  35-40%..... echo.  [id number].Marland Kitchen Nonischemic cardiomyopathy...Marland KitchenMarland KitchenMarland Kitchen catheterization.... April, 2011 and aa CHF....[facility]   April, 2011.Marland [email or web address] troponin elevation at that time Mitral regurgitation   mild.... echo... April, 2011 Tobacco abuse Alcohol use..... heavy until January, 2011.Marland KitchenMarland Kitchenpatient was counseled concerning alcohol..MCH.Marland Kitchen April, 2011 COPD Cardiac catheterization.... April, 2011..... questionable occlusion of the most apical portion of the LAD... LV dysfunction out of proportion to coronary disease (11/23/2009 8:16)       Past History:  Past Medical History:   Chronic pancreatitis.  CT scan shows improving pancreatitis. ..MCH.... treated at a hospital EF  35-40%..... echo.  [id number].Marland Kitchen Nonischemic cardiomyopathy...Marland KitchenMarland KitchenMarland Kitchen catheterization.... April, 2011 and aa CHF....[facility]   April, 2011.Marland [email or web address] troponin elevation at that time Mitral regurgitation   mild.... echo... April, 2011 Tobacco abuse Alcohol use..... heavy until January, 2011.Marland KitchenMarland Kitchenpatient was counseled concerning alcohol..MCH.Marland Kitchen April, 2011 COPD Cardiac catheterization.... April, 2011..... questionable occlusion of the most apical portion of the LAD... LV dysfunction out of proportion to coronary disease

## 2010-08-02 NOTE — Progress Notes (Signed)
Summary: pt feet swollen cant walk  Phone Note Call from Patient Call back at Home Phone [phone]   Caller: Patient Reason for Call: Talk to Nurse, Talk to Doctor Summary of Call: pt feet are so swollen he cant stand he wants to see someone today and needs a call asap Initial call taken by: Omer Jack,  April 25, 2010 10:06 AM  Follow-up for Phone Call        Pt. still can't bear weight on his legs & has been taking 40mg  lasix two times a day. Since he has doubled his lasix & still isn't feeling any better, I have advised him to go to the ER for IV diuretics if needed. I could try to add him on to see Dr. Myrtis Ser tomorrow but his edema hasn't improved in the past 3 days so I think he should be evaluted. ER & Trish notified. Will send this to Dr. Myrtis Ser & to his RN. Whitney Maeola Sarah RN  April 25, 2010 10:22 AM  Follow-up by: Whitney Maeola Sarah RN,  April 25, 2010 10:22 AM

## 2010-08-02 NOTE — Progress Notes (Signed)
Summary: Calling about pt medications  lm to cb  pfh,rn  Phone Note Other Incoming   Caller: [facility]/Christy [phone] Summary of Call: Calling about pt medictions Initial call taken by: Judie Grieve,  June 29, 2010 10:22 AM  Follow-up for Phone Call        Left message for Neysa Bonito to call back  Sander Nephew, RN 11:30 06/29/10  They have [facility] nurse [facility] to come in next week to set up his pill box.  Pt states his cousin has his pill box and Neysa Bonito is concerned that the pt may not be taking his meds correctly.  Will forward to Dr Abelardo Diesel Follow-up by: Charolotte Capuchin, RN,  June 29, 2010 4:46 PM  Additional Follow-up for Phone Call Additional follow up Details #1::        Herbert Seta, please follow up  Talitha Givens, MD, [facility]  June 30, 2010 8:09 AM  have called and left message for Neysa Bonito to call back Meredith Staggers, RN  July 06, 2010 3:25 PM     Additional Follow-up for Phone Call Additional follow up Details #2::    spoke w/pt he states meds were straightened out, sch appt to see Dr Myrtis Ser on Mon 1/23 for f/u as he missed appt in Dec. Meredith Staggers, RN  July 19, 2010 9:50 AM

## 2010-08-02 NOTE — Progress Notes (Signed)
Summary: refill -- folic acid  Phone Note Refill Request Message from:  Patient on January 22, 2010 11:44 AM  Refills Requested: Medication #1:  FOLIC ACID 1 MG TABS once daily Send to [facility]  Initial call taken by: Judie Grieve,  January 22, 2010 11:44 AM  Follow-up for Phone Call        lmtcb Hardin Negus, RMA  January 22, 2010 2:23 PM  tried calling pt several times with no answer and no return call. I called pharmacy today and they will send a request to the PCP, I told them was McKienze per pt's chart  Follow-up by: Hardin Negus, RMA,  February 01, 2010 4:03 PM

## 2010-08-02 NOTE — Progress Notes (Signed)
  Walk in Patient Form Recieved " Pt left Long Term Disability Income Benefits" paper to be completed sent to [employer]. [facility] [place]  March 13, 2010 8:35 AM

## 2010-08-02 NOTE — Miscellaneous (Signed)
Summary: [facility] Written Confirmation of [facility] Order  [facility] Written Confirmation of [facility] Order   Imported By: Roderic Ovens 03/14/2010 11:42:56  _____________________________________________________________________  External Attachment:    Type:   Image     Comment:   External Document

## 2010-08-02 NOTE — Progress Notes (Signed)
Summary: med list update  Phone Note Outgoing Call   Call placed by: Sherri Riley Nearing Summary of Call: Meds are not correct in system, Pt forgets to bring meds in at visit and does not have a list. I spoke with Pt on the phone as he was looking at the bottles that he has. Will correct med list accordingly.    New/Updated Medications: CARVEDILOL 12.5 MG TABS (CARVEDILOL) Take one tablet by mouth twice a day FUROSEMIDE 80 MG TABS (FUROSEMIDE) Take one tablet by mouth two times a day VITAMIN B-1 100 MG TABS (THIAMINE HCL) once daily MAGNESIUM OXIDE 400 MG TABS (MAGNESIUM OXIDE) Take 1 tablet by mouth two times a day Prescriptions: MAGNESIUM OXIDE 400 MG TABS (MAGNESIUM OXIDE) Take 1 tablet by mouth two times a day  #60 x 3   Entered by:   Haze Boyden, CMA   Authorized by:   Talitha Givens, MD, [facility]   Signed by:   Haze Boyden, CMA on 06/27/2010   Method used:   Electronically to        [facility] [address] (retail)       [address]       [place], [place]  [id number]       Ph: [phone]       Fax: [phone]   RxID:   [id number]

## 2010-08-21 ENCOUNTER — Encounter: Payer: Self-pay | Admitting: Cardiology

## 2010-08-22 ENCOUNTER — Encounter: Payer: Self-pay | Admitting: Cardiology

## 2010-08-28 NOTE — Letter (Signed)
Summary: [facility]   [facility]   Imported By: Marylou Mccoy 08/24/2010 13:37:10  _____________________________________________________________________  External Attachment:    Type:   Image     Comment:   External Document

## 2010-09-11 LAB — BASIC METABOLIC PANEL
CO2: 27 mEq/L (ref 19–32)
Calcium: 8.8 mg/dL (ref 8.4–10.5)
Chloride: 101 mEq/L (ref 96–112)
Creatinine, Ser: 1.15 mg/dL (ref 0.4–1.5)
GFR calc Af Amer: >60 mL/min (ref >60)
GFR calc non Af Amer: >60 mL/min (ref >60)
Glucose, Bld: 132 mg/dL — ABNORMAL HIGH (ref 70–99)
Potassium: 4.5 mEq/L (ref 3.5–5.1)
Sodium: 136 mEq/L (ref 135–145)

## 2010-09-11 NOTE — Letter (Signed)
Summary: [facility]  [facility]   Imported By: Marylou Mccoy 09/04/2010 12:39:06  _____________________________________________________________________  External Attachment:    Type:   Image     Comment:   External Document

## 2010-09-11 NOTE — Letter (Signed)
Summary: [facility]  [facility]   Imported By: Marylou Mccoy 09/04/2010 09:54:00  _____________________________________________________________________  External Attachment:    Type:   Image     Comment:   External Document

## 2010-09-12 LAB — MAGNESIUM
Magnesium: 1.4 mg/dL — ABNORMAL LOW (ref 1.5–2.5)
Magnesium: 1.9 mg/dL (ref 1.5–2.5)
Magnesium: 2 mg/dL (ref 1.5–2.5)

## 2010-09-12 LAB — CBC
HCT: 37.2 % — ABNORMAL LOW (ref 39.0–52.0)
Hemoglobin: 12.1 g/dL — ABNORMAL LOW (ref 13.0–17.0)
MCH: 29.5 pg (ref 26.0–34.0)
MCHC: 32.5 g/dL (ref 30.0–36.0)
MCHC: 32.6 g/dL (ref 30.0–36.0)
MCV: 91.4 fL (ref 78.0–100.0)
Platelets: 160 10*3/uL (ref 150–400)
Platelets: 172 10*3/uL (ref 150–400)
RBC: 4.27 MIL/uL (ref 4.22–5.81)
RDW: 22.1 % — ABNORMAL HIGH (ref 11.5–15.5)

## 2010-09-12 LAB — DIFFERENTIAL
Basophils Absolute: 0 10*3/uL (ref 0.0–0.1)
Basophils Relative: 1 % (ref 0–1)
Eosinophils Absolute: 0 10*3/uL (ref 0.0–0.7)
Eosinophils Relative: 1 % (ref 0–5)
Lymphocytes Relative: 34 % (ref 12–46)
Monocytes Relative: 13 % — ABNORMAL HIGH (ref 3–12)
Neutro Abs: 2.4 10*3/uL (ref 1.7–7.7)
Neutrophils Relative %: 51 % (ref 43–77)

## 2010-09-12 LAB — URINALYSIS, ROUTINE W REFLEX MICROSCOPIC
Nitrite: NEGATIVE
Urobilinogen, UA: 1 mg/dL (ref 0.0–1.0)

## 2010-09-12 LAB — BASIC METABOLIC PANEL
BUN: 20 mg/dL (ref 6–23)
BUN: 22 mg/dL (ref 6–23)
BUN: 25 mg/dL — ABNORMAL HIGH (ref 6–23)
BUN: 27 mg/dL — ABNORMAL HIGH (ref 6–23)
BUN: 30 mg/dL — ABNORMAL HIGH (ref 6–23)
CO2: 27 mEq/L (ref 19–32)
CO2: 29 mEq/L (ref 19–32)
CO2: 31 mEq/L (ref 19–32)
Calcium: 8.6 mg/dL (ref 8.4–10.5)
Calcium: 8.8 mg/dL (ref 8.4–10.5)
Chloride: 98 mEq/L (ref 96–112)
Chloride: 98 mEq/L (ref 96–112)
Chloride: 99 mEq/L (ref 96–112)
Creatinine, Ser: 0.9 mg/dL (ref 0.4–1.5)
Creatinine, Ser: 1 mg/dL (ref 0.4–1.5)
Creatinine, Ser: 1 mg/dL (ref 0.4–1.5)
Creatinine, Ser: 1.11 mg/dL (ref 0.4–1.5)
GFR calc Af Amer: >60 mL/min (ref >60)
GFR calc Af Amer: >60 mL/min (ref >60)
GFR calc non Af Amer: >60 mL/min (ref >60)
GFR calc non Af Amer: >60 mL/min (ref >60)
GFR calc non Af Amer: >60 mL/min (ref >60)
GFR calc non Af Amer: >60 mL/min (ref >60)
GFR calc non Af Amer: >60 mL/min (ref >60)
Glucose, Bld: 107 mg/dL — ABNORMAL HIGH (ref 70–99)
Glucose, Bld: 128 mg/dL — ABNORMAL HIGH (ref 70–99)
Glucose, Bld: 163 mg/dL — ABNORMAL HIGH (ref 70–99)
Potassium: 2.7 mEq/L — CL (ref 3.5–5.1)
Potassium: 5 mEq/L (ref 3.5–5.1)
Sodium: 136 mEq/L (ref 135–145)

## 2010-09-12 LAB — COMPREHENSIVE METABOLIC PANEL
Albumin: 2.7 g/dL — ABNORMAL LOW (ref 3.5–5.2)
Alkaline Phosphatase: 164 U/L — ABNORMAL HIGH (ref 39–117)
BUN: 20 mg/dL (ref 6–23)
Potassium: 4 mEq/L (ref 3.5–5.1)
Total Bilirubin: 1.2 mg/dL (ref 0.3–1.2)
Total Protein: 7.5 g/dL (ref 6.0–8.3)

## 2010-09-12 LAB — CARDIAC PANEL(CRET KIN+CKTOT+MB+TROPI)
CK, MB: 2.8 ng/mL (ref 0.3–4.0)
CK, MB: 3.2 ng/mL (ref 0.3–4.0)
Relative Index: INVALID (ref 0.0–2.5)
Relative Index: INVALID (ref 0.0–2.5)
Total CK: 42 U/L (ref 7–232)
Total CK: 49 U/L (ref 7–232)
Troponin I: 0.06 ng/mL (ref 0.00–0.06)

## 2010-09-12 LAB — POCT CARDIAC MARKERS
CKMB, poc: 1.3 ng/mL (ref 1.0–8.0)
Troponin i, poc: <0.05 ng/mL (ref 0.00–0.09)

## 2010-09-12 LAB — PROTIME-INR: INR: 1.37 (ref 0.00–1.49)

## 2010-09-12 LAB — TSH: TSH: 4.247 u[IU]/mL (ref 0.350–4.500)

## 2010-09-12 LAB — TROPONIN I: Troponin I: 0.07 ng/mL — ABNORMAL HIGH (ref 0.00–0.06)

## 2010-09-12 LAB — CK TOTAL AND CKMB (NOT AT ARMC): CK, MB: 3 ng/mL (ref 0.3–4.0)

## 2010-09-12 LAB — LIPID PANEL
Cholesterol: 79 mg/dL (ref 0–200)
LDL Cholesterol: 44 mg/dL (ref 0–99)

## 2010-09-13 LAB — COMPREHENSIVE METABOLIC PANEL
AST: 78 U/L — ABNORMAL HIGH (ref 0–37)
Albumin: 3 g/dL — ABNORMAL LOW (ref 3.5–5.2)
BUN: 41 mg/dL — ABNORMAL HIGH (ref 6–23)
Calcium: 9 mg/dL (ref 8.4–10.5)
Chloride: 103 mEq/L (ref 96–112)
Creatinine, Ser: 1.07 mg/dL (ref 0.4–1.5)
GFR calc Af Amer: >60 mL/min (ref >60)
Total Bilirubin: 1 mg/dL (ref 0.3–1.2)
Total Protein: 8.6 g/dL — ABNORMAL HIGH (ref 6.0–8.3)

## 2010-09-13 LAB — URINALYSIS, ROUTINE W REFLEX MICROSCOPIC
Glucose, UA: NEGATIVE mg/dL
Hgb urine dipstick: NEGATIVE
Leukocytes, UA: NEGATIVE
Protein, ur: 30 mg/dL — AB
Specific Gravity, Urine: 1.023 (ref 1.005–1.030)

## 2010-09-13 LAB — DIFFERENTIAL
Basophils Absolute: 0 10*3/uL (ref 0.0–0.1)
Basophils Relative: 0 % (ref 0–1)
Eosinophils Relative: 0 % (ref 0–5)
Lymphocytes Relative: 22 % (ref 12–46)
Lymphs Abs: 2.6 10*3/uL (ref 0.7–4.0)
Monocytes Relative: 10 % (ref 3–12)
Neutro Abs: 8 10*3/uL — ABNORMAL HIGH (ref 1.7–7.7)

## 2010-09-13 LAB — CBC
MCH: 29.3 pg (ref 26.0–34.0)
MCV: 91.2 fL (ref 78.0–100.0)
Platelets: 198 10*3/uL (ref 150–400)
RBC: 3.99 MIL/uL — ABNORMAL LOW (ref 4.22–5.81)
RDW: 21.6 % — ABNORMAL HIGH (ref 11.5–15.5)

## 2010-09-13 LAB — URINE MICROSCOPIC-ADD ON

## 2010-09-18 LAB — PHOSPHORUS: Phosphorus: 4.4 mg/dL (ref 2.3–4.6)

## 2010-09-18 LAB — COMPREHENSIVE METABOLIC PANEL
ALT: 41 U/L (ref 0–53)
AST: 72 U/L — ABNORMAL HIGH (ref 0–37)
Albumin: 2.9 g/dL — ABNORMAL LOW (ref 3.5–5.2)
Alkaline Phosphatase: 103 U/L (ref 39–117)
BUN: 15 mg/dL (ref 6–23)
CO2: 24 mEq/L (ref 19–32)
Calcium: 9 mg/dL (ref 8.4–10.5)
Chloride: 99 mEq/L (ref 96–112)
Creatinine, Ser: 0.7 mg/dL (ref 0.4–1.5)
GFR calc Af Amer: >60 mL/min (ref >60)
GFR calc non Af Amer: >60 mL/min (ref >60)
Glucose, Bld: 118 mg/dL — ABNORMAL HIGH (ref 70–99)
Potassium: 3.9 mEq/L (ref 3.5–5.1)
Sodium: 134 mEq/L — ABNORMAL LOW (ref 135–145)
Total Bilirubin: 0.6 mg/dL (ref 0.3–1.2)
Total Protein: 8.1 g/dL (ref 6.0–8.3)

## 2010-09-18 LAB — URINALYSIS, ROUTINE W REFLEX MICROSCOPIC
Bilirubin Urine: NEGATIVE
Glucose, UA: NEGATIVE mg/dL
Hgb urine dipstick: NEGATIVE
Ketones, ur: NEGATIVE mg/dL
Leukocytes, UA: NEGATIVE
Nitrite: NEGATIVE
Protein, ur: 30 mg/dL — AB
Specific Gravity, Urine: 1.021 (ref 1.005–1.030)
Urobilinogen, UA: 0.2 mg/dL (ref 0.0–1.0)
pH: 5.5 (ref 5.0–8.0)

## 2010-09-18 LAB — DIFFERENTIAL
Basophils Absolute: 0 10*3/uL (ref 0.0–0.1)
Basophils Relative: 0 % (ref 0–1)
Eosinophils Absolute: 0.1 10*3/uL (ref 0.0–0.7)
Eosinophils Relative: 2 % (ref 0–5)
Lymphocytes Relative: 24 % (ref 12–46)
Lymphs Abs: 1.7 10*3/uL (ref 0.7–4.0)
Monocytes Absolute: 0.4 10*3/uL (ref 0.1–1.0)
Monocytes Relative: 6 % (ref 3–12)
Neutro Abs: 4.8 10*3/uL (ref 1.7–7.7)
Neutrophils Relative %: 68 % (ref 43–77)

## 2010-09-18 LAB — BASIC METABOLIC PANEL
BUN: 16 mg/dL (ref 6–23)
BUN: 17 mg/dL (ref 6–23)
CO2: 27 mEq/L (ref 19–32)
Calcium: 8.2 mg/dL — ABNORMAL LOW (ref 8.4–10.5)
Chloride: 96 mEq/L (ref 96–112)
Chloride: 99 mEq/L (ref 96–112)
Creatinine, Ser: 0.73 mg/dL (ref 0.4–1.5)
Creatinine, Ser: 0.75 mg/dL (ref 0.4–1.5)
GFR calc Af Amer: >60 mL/min (ref >60)
GFR calc non Af Amer: >60 mL/min (ref >60)
Glucose, Bld: 127 mg/dL — ABNORMAL HIGH (ref 70–99)
Potassium: 3.1 mEq/L — ABNORMAL LOW (ref 3.5–5.1)
Potassium: 4.1 mEq/L (ref 3.5–5.1)
Potassium: 4.3 mEq/L (ref 3.5–5.1)

## 2010-09-18 LAB — CBC
HCT: 31.7 % — ABNORMAL LOW (ref 39.0–52.0)
Hemoglobin: 10.4 g/dL — ABNORMAL LOW (ref 13.0–17.0)
MCHC: 32.7 g/dL (ref 30.0–36.0)
MCV: 87.5 fL (ref 78.0–100.0)
Platelets: 270 10*3/uL (ref 150–400)
RBC: 3.63 MIL/uL — ABNORMAL LOW (ref 4.22–5.81)
RDW: 18.4 % — ABNORMAL HIGH (ref 11.5–15.5)
WBC: 7 10*3/uL (ref 4.0–10.5)

## 2010-09-18 LAB — CARDIAC PANEL(CRET KIN+CKTOT+MB+TROPI): Total CK: 18 U/L (ref 7–232)

## 2010-09-18 LAB — URINE MICROSCOPIC-ADD ON

## 2010-09-18 LAB — BRAIN NATRIURETIC PEPTIDE
Pro B Natriuretic peptide (BNP): 2505 pg/mL — ABNORMAL HIGH (ref 0.0–100.0)
Pro B Natriuretic peptide (BNP): 982 pg/mL — ABNORMAL HIGH (ref 0.0–100.0)

## 2010-09-18 LAB — POCT CARDIAC MARKERS: Troponin i, poc: 0.19 ng/mL — ABNORMAL HIGH (ref 0.00–0.09)

## 2010-09-18 LAB — LIPASE, BLOOD: Lipase: 33 U/L (ref 11–59)

## 2010-09-18 LAB — PROTIME-INR
INR: 1.18 (ref 0.00–1.49)
Prothrombin Time: 14.9 seconds (ref 11.6–15.2)

## 2010-09-18 LAB — MAGNESIUM: Magnesium: 1.9 mg/dL (ref 1.5–2.5)

## 2010-09-19 LAB — LIPID PANEL
Cholesterol: 128 mg/dL (ref 0–200)
HDL: 18 mg/dL — ABNORMAL LOW (ref >39)
Triglycerides: 102 mg/dL (ref <150)

## 2010-09-19 LAB — CARDIAC PANEL(CRET KIN+CKTOT+MB+TROPI)
Relative Index: INVALID (ref 0.0–2.5)
Total CK: 24 U/L (ref 7–232)
Total CK: 32 U/L (ref 7–232)

## 2010-09-19 LAB — URINALYSIS, ROUTINE W REFLEX MICROSCOPIC
Glucose, UA: NEGATIVE mg/dL
Hgb urine dipstick: NEGATIVE
Ketones, ur: NEGATIVE mg/dL
pH: 6.5 (ref 5.0–8.0)

## 2010-09-19 LAB — PROTIME-INR: Prothrombin Time: 13.9 seconds (ref 11.6–15.2)

## 2010-09-19 LAB — CBC
HCT: 31.7 % — ABNORMAL LOW (ref 39.0–52.0)
Hemoglobin: 10.5 g/dL — ABNORMAL LOW (ref 13.0–17.0)
Hemoglobin: 10.5 g/dL — ABNORMAL LOW (ref 13.0–17.0)
Hemoglobin: 10.7 g/dL — ABNORMAL LOW (ref 13.0–17.0)
Hemoglobin: 11.6 g/dL — ABNORMAL LOW (ref 13.0–17.0)
Hemoglobin: 11.7 g/dL — ABNORMAL LOW (ref 13.0–17.0)
MCHC: 33.2 g/dL (ref 30.0–36.0)
MCHC: 33.2 g/dL (ref 30.0–36.0)
MCHC: 33.3 g/dL (ref 30.0–36.0)
MCV: 86.8 fL (ref 78.0–100.0)
MCV: 87.2 fL (ref 78.0–100.0)
Platelets: 435 10*3/uL — ABNORMAL HIGH (ref 150–400)
Platelets: 445 10*3/uL — ABNORMAL HIGH (ref 150–400)
RBC: 3.69 MIL/uL — ABNORMAL LOW (ref 4.22–5.81)
RBC: 4.07 MIL/uL — ABNORMAL LOW (ref 4.22–5.81)
RDW: 13.8 % (ref 11.5–15.5)
RDW: 14.1 % (ref 11.5–15.5)
RDW: 14.4 % (ref 11.5–15.5)
RDW: 14.7 % (ref 11.5–15.5)
WBC: 11 10*3/uL — ABNORMAL HIGH (ref 4.0–10.5)
WBC: 7.7 10*3/uL (ref 4.0–10.5)

## 2010-09-19 LAB — URINE MICROSCOPIC-ADD ON

## 2010-09-19 LAB — SODIUM: Sodium: 135 mEq/L (ref 135–145)

## 2010-09-19 LAB — COMPREHENSIVE METABOLIC PANEL
ALT: 36 U/L (ref 0–53)
AST: 63 U/L — ABNORMAL HIGH (ref 0–37)
Albumin: 1.9 g/dL — ABNORMAL LOW (ref 3.5–5.2)
Albumin: 2.3 g/dL — ABNORMAL LOW (ref 3.5–5.2)
Alkaline Phosphatase: 70 U/L (ref 39–117)
BUN: 5 mg/dL — ABNORMAL LOW (ref 6–23)
Calcium: 8.1 mg/dL — ABNORMAL LOW (ref 8.4–10.5)
Calcium: 8.6 mg/dL (ref 8.4–10.5)
Creatinine, Ser: 0.55 mg/dL (ref 0.4–1.5)
GFR calc Af Amer: >60 mL/min (ref >60)
Glucose, Bld: 125 mg/dL — ABNORMAL HIGH (ref 70–99)
Potassium: 3.4 mEq/L — ABNORMAL LOW (ref 3.5–5.1)
Sodium: 135 mEq/L (ref 135–145)
Total Protein: 5.7 g/dL — ABNORMAL LOW (ref 6.0–8.3)
Total Protein: 6.9 g/dL (ref 6.0–8.3)

## 2010-09-19 LAB — DIFFERENTIAL
Basophils Relative: 0 % (ref 0–1)
Eosinophils Absolute: 0 10*3/uL (ref 0.0–0.7)
Lymphs Abs: 2.3 10*3/uL (ref 0.7–4.0)
Monocytes Absolute: 0.5 10*3/uL (ref 0.1–1.0)
Monocytes Relative: 5 % (ref 3–12)
Neutro Abs: 8.1 10*3/uL — ABNORMAL HIGH (ref 1.7–7.7)

## 2010-09-19 LAB — MAGNESIUM: Magnesium: 1.6 mg/dL (ref 1.5–2.5)

## 2010-09-19 LAB — POCT I-STAT, CHEM 8
Calcium, Ion: 0.92 mmol/L — ABNORMAL LOW (ref 1.12–1.32)
Chloride: 83 mEq/L — ABNORMAL LOW (ref 96–112)
Creatinine, Ser: 0.6 mg/dL (ref 0.4–1.5)
Glucose, Bld: 103 mg/dL — ABNORMAL HIGH (ref 70–99)
HCT: 38 % — ABNORMAL LOW (ref 39.0–52.0)
Potassium: 2.9 mEq/L — ABNORMAL LOW (ref 3.5–5.1)

## 2010-09-19 LAB — RETICULOCYTES
Retic Count, Absolute: 34.1 10*3/uL (ref 19.0–186.0)
Retic Ct Pct: 0.9 % (ref 0.4–3.1)

## 2010-09-19 LAB — MRSA PCR SCREENING: MRSA by PCR: NEGATIVE

## 2010-09-19 LAB — HEPATIC FUNCTION PANEL
ALT: 35 U/L (ref 0–53)
ALT: 37 U/L (ref 0–53)
AST: 80 U/L — ABNORMAL HIGH (ref 0–37)
Albumin: 2.3 g/dL — ABNORMAL LOW (ref 3.5–5.2)
Alkaline Phosphatase: 53 U/L (ref 39–117)
Alkaline Phosphatase: 72 U/L (ref 39–117)
Bilirubin, Direct: 0.8 mg/dL — ABNORMAL HIGH (ref 0.0–0.3)
Bilirubin, Direct: <0.1 mg/dL (ref 0.0–0.3)
Total Bilirubin: 2.2 mg/dL — ABNORMAL HIGH (ref 0.3–1.2)

## 2010-09-19 LAB — POCT CARDIAC MARKERS
CKMB, poc: 1.1 ng/mL (ref 1.0–8.0)
Myoglobin, poc: 103 ng/mL (ref 12–200)
Troponin i, poc: 0.5 ng/mL (ref 0.00–0.09)

## 2010-09-19 LAB — BASIC METABOLIC PANEL
CO2: 25 mEq/L (ref 19–32)
CO2: 31 mEq/L (ref 19–32)
Calcium: 7.9 mg/dL — ABNORMAL LOW (ref 8.4–10.5)
Calcium: 7.9 mg/dL — ABNORMAL LOW (ref 8.4–10.5)
Chloride: 104 mEq/L (ref 96–112)
Chloride: 93 mEq/L — ABNORMAL LOW (ref 96–112)
Creatinine, Ser: 0.52 mg/dL (ref 0.4–1.5)
GFR calc Af Amer: >60 mL/min (ref >60)
GFR calc Af Amer: >60 mL/min (ref >60)
GFR calc Af Amer: >60 mL/min (ref >60)
GFR calc non Af Amer: >60 mL/min (ref >60)
Glucose, Bld: 118 mg/dL — ABNORMAL HIGH (ref 70–99)
Glucose, Bld: 123 mg/dL — ABNORMAL HIGH (ref 70–99)
Potassium: 3.5 mEq/L (ref 3.5–5.1)
Sodium: 131 mEq/L — ABNORMAL LOW (ref 135–145)
Sodium: 132 mEq/L — ABNORMAL LOW (ref 135–145)
Sodium: 134 mEq/L — ABNORMAL LOW (ref 135–145)

## 2010-09-19 LAB — RAPID URINE DRUG SCREEN, HOSP PERFORMED: Barbiturates: NOT DETECTED

## 2010-09-19 LAB — GLUCOSE, CAPILLARY
Glucose-Capillary: 102 mg/dL — ABNORMAL HIGH (ref 70–99)
Glucose-Capillary: 103 mg/dL — ABNORMAL HIGH (ref 70–99)
Glucose-Capillary: 112 mg/dL — ABNORMAL HIGH (ref 70–99)
Glucose-Capillary: 164 mg/dL — ABNORMAL HIGH (ref 70–99)
Glucose-Capillary: 39 mg/dL — CL (ref 70–99)
Glucose-Capillary: 84 mg/dL (ref 70–99)

## 2010-09-19 LAB — IRON AND TIBC
Saturation Ratios: 24 % (ref 20–55)
TIBC: 111 ug/dL — ABNORMAL LOW (ref 215–435)
UIBC: 84 ug/dL

## 2010-09-19 LAB — CK TOTAL AND CKMB (NOT AT ARMC)
CK, MB: 0.7 ng/mL (ref 0.3–4.0)
CK, MB: 1.6 ng/mL (ref 0.3–4.0)

## 2010-09-19 LAB — VITAMIN B12: Vitamin B-12: 348 pg/mL (ref 211–911)

## 2010-09-19 LAB — CALCIUM: Calcium: 8.1 mg/dL — ABNORMAL LOW (ref 8.4–10.5)

## 2010-09-19 LAB — HEPATITIS PANEL, ACUTE: Hep A IgM: NEGATIVE

## 2010-09-19 LAB — FOLATE: Folate: 7.9 ng/mL

## 2010-09-19 LAB — OSMOLALITY: Osmolality: 271 mOsm/kg — ABNORMAL LOW (ref 275–300)

## 2010-09-19 LAB — TROPONIN I: Troponin I: 0.95 ng/mL (ref 0.00–0.06)

## 2010-10-29 ENCOUNTER — Other Ambulatory Visit: Payer: Self-pay | Admitting: Cardiology

## 2010-12-12 ENCOUNTER — Encounter: Payer: Self-pay | Admitting: Cardiology

## 2011-01-28 ENCOUNTER — Other Ambulatory Visit: Payer: Self-pay | Admitting: Cardiology

## 2011-01-29 ENCOUNTER — Ambulatory Visit (INDEPENDENT_AMBULATORY_CARE_PROVIDER_SITE_OTHER): Payer: Self-pay | Admitting: Cardiology

## 2011-01-29 ENCOUNTER — Encounter: Payer: Self-pay | Admitting: Cardiology

## 2011-01-29 DIAGNOSIS — I2510 Atherosclerotic heart disease of native coronary artery without angina pectoris: Secondary | ICD-10-CM | POA: Insufficient documentation

## 2011-01-29 DIAGNOSIS — K861 Other chronic pancreatitis: Secondary | ICD-10-CM | POA: Insufficient documentation

## 2011-01-29 DIAGNOSIS — J449 Chronic obstructive pulmonary disease, unspecified: Secondary | ICD-10-CM | POA: Insufficient documentation

## 2011-01-29 DIAGNOSIS — Z7289 Other problems related to lifestyle: Secondary | ICD-10-CM

## 2011-01-29 DIAGNOSIS — I428 Other cardiomyopathies: Secondary | ICD-10-CM

## 2011-01-29 DIAGNOSIS — E8779 Other fluid overload: Secondary | ICD-10-CM

## 2011-01-29 DIAGNOSIS — Z720 Tobacco use: Secondary | ICD-10-CM | POA: Insufficient documentation

## 2011-01-29 DIAGNOSIS — I340 Nonrheumatic mitral (valve) insufficiency: Secondary | ICD-10-CM | POA: Insufficient documentation

## 2011-01-29 DIAGNOSIS — R9430 Abnormal result of cardiovascular function study, unspecified: Secondary | ICD-10-CM | POA: Insufficient documentation

## 2011-01-29 NOTE — Assessment & Plan Note (Signed)
Volume status is stable. No change in therapy. 

## 2011-01-29 NOTE — Progress Notes (Signed)
HPI Patient is seen for followup of cardiomyopathy.  Fortunately he did stop drinking completely.  I believe this as well as allow him to stabilize over time.  He is taking his medications.  His labs have been stable.  He has some mild dizziness with standing but no syncope or presyncope.  He has no chest pain. No Known Allergies  Current Outpatient Prescriptions  Medication Sig Dispense Refill  . aspirin 81 MG tablet Take 81 mg by mouth daily.        . carvedilol (COREG) 12.5 MG tablet Take 12.5 mg by mouth 2 (two) times daily.        . enalapril (VASOTEC) 5 MG tablet Take 5 mg by mouth 2 (two) times daily.        . furosemide (LASIX) 80 MG tablet Take 80 mg by mouth 2 (two) times daily.        . magnesium oxide (MAG-OX) 400 MG tablet take 1 tablet by mouth twice a day  60 tablet  3  . multivitamin (THERAGRAN) per tablet Take 1 tablet by mouth daily.        . nitroGLYCERIN (NITROSTAT) 0.4 MG SL tablet Place 0.4 mg under the tongue every 5 (five) minutes as needed.        . Thiamine HCl (VITAMIN B-1) 100 MG tablet Take 100 mg by mouth daily.          History   Social History  . Marital Status: Single    Spouse Name: N/A    Number of Children: N/A  . Years of Education: N/A   Occupational History  . Not on file.   Social History Main Topics  . Smoking status: Current Everyday Smoker  . Smokeless tobacco: Not on file  . Alcohol Use: Yes     heavy untill Jan 2011  . Drug Use: No  . Sexually Active:    Other Topics Concern  . Not on file   Social History Narrative  . No narrative on file    Family History  Problem Relation Age of Onset  . Diabetes Cousin     Past Medical History  Diagnosis Date  . Chronic pancreatitis     CT scan shows improving pancreatitis  . Nonischemic cardiomyopathy 09/2009    Catheterization, April, 2011, questionable occlusion of the most apical portion of the LAD, LV dysfunction out of proportion to coronary disease  . CHF (congestive heart  failure)     April, 2011 mild troponin elevation at that time  . Mitral regurgitation     mild, echo April 2011  . Tobacco abuse   . Alcohol use     heavy until Jan 2011, patient was counseled concerning alcohol in April 2011  . COPD (chronic obstructive pulmonary disease)   . Fluid overload 03/2010    October, 2011  . Cough Jan 2012    may be from enalapril, January, 2012  . Ejection fraction < 50%     EF 35-40%, echo, April, 2011  . CAD (coronary artery disease)     Catheterization, April, 2011, questionable occlusion of the most apical portion of the LAD, LV dysfunction out of proportion to coronary disease    Past Surgical History  Procedure Date  . Cardiac catheterization April 2011    questionable occlusion of the most apical portion of the LAD / LV dysfunction out of proportion to coronary disease  . Wrist surgey     left wrist    ROS  Patient  denies fever, chills, headache, sweats, rash, change in vision, change in hearing, chest pain, cough, nausea vomiting, urinary symptoms.  All other systems are reviewed and negative.  PHYSICAL EXAM Patient is oriented to person time and place.  Affect is normal.  He is thin.  He is stable he has poor dentition.  There is no jugular venous distention.  Lungs are clear.  Respiratory effort is not labored.  Cardiac exam reveals an S1-S2.  There are no clicks or significant murmurs.  Abdomen is soft.  There is no significant peripheral edema. Filed Vitals:   01/29/11 1545  BP: 64/0  Pulse: 68  Height: 5\' 6"  (1.676 m)  Weight: 103 lb (46.72 kg)    EKG Done today and reviewed by me.  There is sinus rhythm with ST-T wave abnormalities.  ASSESSMENT & PLAN

## 2011-01-29 NOTE — Assessment & Plan Note (Signed)
He has stopped drinking completely.  I applauded him for this.

## 2011-01-29 NOTE — Patient Instructions (Signed)
Your physician recommends that you schedule a follow-up appointment in: 6 months  

## 2011-01-29 NOTE — Assessment & Plan Note (Signed)
Patient is stable.  No change in therapy.

## 2011-03-18 ENCOUNTER — Other Ambulatory Visit: Payer: Self-pay | Admitting: Cardiology

## 2011-03-19 ENCOUNTER — Telehealth: Payer: Self-pay | Admitting: Cardiology

## 2011-03-19 NOTE — Telephone Encounter (Signed)
Magnisium 400mg  bid and enapapril 5mg  bid called into [facility] on [address]. [phone]

## 2011-03-20 NOTE — Telephone Encounter (Signed)
SENT MEDS TO THE PHARMACY

## 2011-06-22 ENCOUNTER — Other Ambulatory Visit: Payer: Self-pay | Admitting: Cardiology

## 2011-06-26 ENCOUNTER — Other Ambulatory Visit: Payer: Self-pay

## 2011-06-26 MED ORDER — FUROSEMIDE 80 MG PO TABS: 80.0000 mg | ORAL_TABLET | Freq: Two times a day (BID) | ORAL | Status: DC

## 2011-07-03 ENCOUNTER — Telehealth: Payer: Self-pay | Admitting: Cardiology

## 2011-07-03 ENCOUNTER — Other Ambulatory Visit: Payer: Self-pay | Admitting: Cardiology

## 2011-07-03 NOTE — Telephone Encounter (Signed)
Called Delice Bison at [facility] and verified Lasix.

## 2011-07-03 NOTE — Telephone Encounter (Signed)
New Msg: Delice Bison from [facility] calling about e-script, furosemide. Please return call to discuss further.

## 2011-07-30 ENCOUNTER — Other Ambulatory Visit: Payer: Self-pay | Admitting: Cardiology

## 2011-08-01 ENCOUNTER — Ambulatory Visit (INDEPENDENT_AMBULATORY_CARE_PROVIDER_SITE_OTHER): Payer: Self-pay | Admitting: Cardiology

## 2011-08-01 ENCOUNTER — Encounter: Payer: Self-pay | Admitting: Cardiology

## 2011-08-01 DIAGNOSIS — Z7289 Other problems related to lifestyle: Secondary | ICD-10-CM

## 2011-08-01 DIAGNOSIS — K861 Other chronic pancreatitis: Secondary | ICD-10-CM

## 2011-08-01 DIAGNOSIS — I428 Other cardiomyopathies: Secondary | ICD-10-CM

## 2011-08-01 DIAGNOSIS — I2510 Atherosclerotic heart disease of native coronary artery without angina pectoris: Secondary | ICD-10-CM

## 2011-08-01 NOTE — Assessment & Plan Note (Signed)
Patient has mild coronary disease. This is being followed.

## 2011-08-01 NOTE — Assessment & Plan Note (Signed)
Fortunately he's not had any pancreatitis recently. He must A. Off alcohol.

## 2011-08-01 NOTE — Progress Notes (Signed)
HPI Patient is seen in followup cardiomyopathy. He's done very well. He had stopped drinking alcohol completely. He said he did have some during the holidays. Hepatic careful discussion with him again to remind him that alcohol is a poison for him. He seems to understand. He is watching his salt and fluid intake.   No Known Allergies  Current Outpatient Prescriptions  Medication Sig Dispense Refill  . aspirin 81 MG tablet Take 81 mg by mouth daily.        . carvedilol (COREG) 12.5 MG tablet take 1 tablet by mouth twice a day  60 tablet  6  . enalapril (VASOTEC) 5 MG tablet take 1 tablet by mouth twice a day  60 tablet  12  . furosemide (LASIX) 80 MG tablet take 1 tablet by mouth once daily  30 tablet  5  . Magnesium Oxide 400 (241.3 MG) MG TABS take 1 tablet by mouth twice a day  60 tablet  1  . multivitamin (THERAGRAN) per tablet Take 1 tablet by mouth daily.        . nitroGLYCERIN (NITROSTAT) 0.4 MG SL tablet Place 0.4 mg under the tongue every 5 (five) minutes as needed.        . Thiamine HCl (VITAMIN B-1) 100 MG tablet Take 100 mg by mouth daily.          History   Social History  . Marital Status: Single    Spouse Name: N/A    Number of Children: N/A  . Years of Education: N/A   Occupational History  . Not on file.   Social History Main Topics  . Smoking status: Current Everyday Smoker -- 0.5 packs/day for 30 years    Types: Cigarettes  . Smokeless tobacco: Not on file  . Alcohol Use: Yes     heavy untill Jan 2011  . Drug Use: No  . Sexually Active: Not on file   Other Topics Concern  . Not on file   Social History Narrative  . No narrative on file    Family History  Problem Relation Age of Onset  . Diabetes Cousin     Past Medical History  Diagnosis Date  . Chronic pancreatitis     CT scan shows improving pancreatitis  . Nonischemic cardiomyopathy 09/2009    Catheterization, April, 2011, questionable occlusion of the most apical portion of the LAD, LV  dysfunction out of proportion to coronary disease  . CHF (congestive heart failure)     April, 2011 mild troponin elevation at that time  . Mitral regurgitation     mild, echo April 2011  . Tobacco abuse   . Alcohol use     heavy until Jan 2011, patient was counseled concerning alcohol in April 2011  . COPD (chronic obstructive pulmonary disease)   . Fluid overload 03/2010    October, 2011  . Cough Jan 2012    may be from enalapril, January, 2012  . Ejection fraction < 50%     EF 35-40%, echo, April, 2011  . CAD (coronary artery disease)     Catheterization, April, 2011, questionable occlusion of the most apical portion of the LAD, LV dysfunction out of proportion to coronary disease    Past Surgical History  Procedure Date  . Cardiac catheterization April 2011    questionable occlusion of the most apical portion of the LAD / LV dysfunction out of proportion to coronary disease  . Wrist surgey     left wrist  ROS   Patient denies fever, chills, headache, sweats, rash, change in vision, change in hearing, chest pain, cough, nausea vomiting, urinary symptoms. All other systems are reviewed and are negative.  PHYSICAL EXAM Patient is thin but stable. There is no jugular venous distention. Lungs are clear. Respiratory effort is nonlabored. Cardiac exam reveals S1 and S2. There no clicks or significant murmurs. Abdomen is soft. There is no peripheral edema.  Filed Vitals:   08/01/11 1342  BP: 92/58  Pulse: 76  Resp: 20  Height: 5\' 6"  (1.676 m)  Weight: 116 lb 6.4 oz (52.799 kg)    ASSESSMENT & PLAN

## 2011-08-01 NOTE — Assessment & Plan Note (Signed)
Overall he's stable. I've had a careful discussion with him about staying off alcohol. He is on the appropriate medications.

## 2011-08-01 NOTE — Patient Instructions (Signed)
Your physician wants you to follow-up in:  6 months. You will receive a reminder letter in the mail two months in advance. If you don't receive a letter, please call our office to schedule the follow-up appointment.   

## 2011-08-01 NOTE — Assessment & Plan Note (Signed)
The key to his survival will be to not drink alcohol.

## 2011-09-07 ENCOUNTER — Other Ambulatory Visit: Payer: Self-pay | Admitting: Cardiology

## 2011-10-04 ENCOUNTER — Other Ambulatory Visit: Payer: Self-pay | Admitting: Cardiology

## 2012-01-06 ENCOUNTER — Ambulatory Visit: Payer: Self-pay | Admitting: Cardiology

## 2012-02-25 ENCOUNTER — Other Ambulatory Visit: Payer: Self-pay | Admitting: Cardiology

## 2012-03-03 ENCOUNTER — Other Ambulatory Visit: Payer: Self-pay | Admitting: Cardiology

## 2012-03-04 ENCOUNTER — Ambulatory Visit: Payer: Self-pay | Admitting: Cardiology

## 2012-03-09 ENCOUNTER — Encounter: Payer: Self-pay | Admitting: Cardiology

## 2012-03-09 ENCOUNTER — Ambulatory Visit (INDEPENDENT_AMBULATORY_CARE_PROVIDER_SITE_OTHER): Payer: Medicare Other | Admitting: Cardiology

## 2012-03-09 VITALS — BP 130/81 | HR 66 | Ht 66.0 in | Wt 118.0 lb

## 2012-03-09 DIAGNOSIS — R9430 Abnormal result of cardiovascular function study, unspecified: Secondary | ICD-10-CM

## 2012-03-09 DIAGNOSIS — I428 Other cardiomyopathies: Secondary | ICD-10-CM

## 2012-03-09 DIAGNOSIS — R0989 Other specified symptoms and signs involving the circulatory and respiratory systems: Secondary | ICD-10-CM

## 2012-03-09 DIAGNOSIS — I509 Heart failure, unspecified: Secondary | ICD-10-CM

## 2012-03-09 DIAGNOSIS — K861 Other chronic pancreatitis: Secondary | ICD-10-CM

## 2012-03-09 NOTE — Assessment & Plan Note (Signed)
Patient has no signs or symptoms of congestive heart failure at this time. No change in therapy.

## 2012-03-09 NOTE — Progress Notes (Signed)
HPI   Michael Keith returns for followup of his cardiomyopathy. Michael Keith's actually done well. In the past Michael Keith had told me that Michael Keith had stopped drinking any alcohol. Michael Keith does admit today that Michael Keith rarely drinks a beer. I encouraged him to drink no alcohol. Michael Keith has done very well. Michael Keith has not had any recurrent pancreatitis.  No Known Allergies  Current Outpatient Prescriptions  Medication Sig Dispense Refill  . aspirin 81 MG tablet Take 81 mg by mouth daily.        . carvedilol (COREG) 12.5 MG tablet take 1 tablet by mouth twice a day  60 tablet  6  . enalapril (VASOTEC) 5 MG tablet take 1 tablet by mouth twice a day  60 tablet  12  . furosemide (LASIX) 80 MG tablet take 1 tablet by mouth once daily  30 tablet  5  . magnesium oxide (MAG-OX) 400 (241.3 MG) MG tablet take 1 tablet by mouth twice a day  60 tablet  3  . multivitamin (THERAGRAN) per tablet Take 1 tablet by mouth daily.        . nitroGLYCERIN (NITROSTAT) 0.4 MG SL tablet Place 0.4 mg under the tongue every 5 (five) minutes as needed.        . Thiamine HCl (VITAMIN B-1) 100 MG tablet Take 100 mg by mouth daily.          History   Social History  . Marital Status: Single    Spouse Name: N/A    Number of Children: N/A  . Years of Education: N/A   Occupational History  . Not on file.   Social History Main Topics  . Smoking status: Current Everyday Smoker -- 0.5 packs/day for 30 years    Types: Cigarettes  . Smokeless tobacco: Not on file  . Alcohol Use: Yes     heavy untill Jan 2011  . Drug Use: No  . Sexually Active: Not on file   Other Topics Concern  . Not on file   Social History Narrative  . No narrative on file    Family History  Problem Relation Age of Onset  . Diabetes Cousin     Past Medical History  Diagnosis Date  . Chronic pancreatitis     CT scan shows improving pancreatitis  . Nonischemic cardiomyopathy 09/2009    Catheterization, April, 2011, questionable occlusion of the most apical portion of the LAD,  LV dysfunction out of proportion to coronary disease  . CHF (congestive heart failure)     April, 2011 mild troponin elevation at that time  . Mitral regurgitation     mild, echo April 2011  . Tobacco abuse   . Alcohol use     heavy until Jan 2011, patient was counseled concerning alcohol in April 2011  . COPD (chronic obstructive pulmonary disease)   . Fluid overload 03/2010    October, 2011  . Cough Jan 2012    may be from enalapril, January, 2012  . Ejection fraction < 50%     EF 35-40%, echo, April, 2011  . CAD (coronary artery disease)     Catheterization, April, 2011, questionable occlusion of the most apical portion of the LAD, LV dysfunction out of proportion to coronary disease    Past Surgical History  Procedure Date  . Cardiac catheterization April 2011    questionable occlusion of the most apical portion of the LAD / LV dysfunction out of proportion to coronary disease  . Wrist surgey  left wrist    ROS    Patient denies fever, chills, headache, sweats, rash, change in vision, change in hearing, chest pain, cough, nausea vomiting, urinary symptoms. All other systems are reviewed and are negative.  PHYSICAL EXAM   The patient is thin but stable. Michael Keith has poor dentition. There is no jugular venous distention. Lungs are clear. Respiratory effort is nonlabored. Cardiac exam reveals S1 and S2. There no clicks or significant murmurs. The abdomen is soft. There is no peripheral edema. Michael Keith does walk with a cane. There are no skin rashes.  Filed Vitals:   03/09/12 1124  BP: 130/81  Pulse: 66  Height: 5\' 6"  (1.676 m)  Weight: 118 lb (53.524 kg)   EKG is done today and reviewed by me. There sinus rhythm. There is nonspecific ST-T wave changes. There is no change from the past.  ASSESSMENT & PLAN

## 2012-03-09 NOTE — Patient Instructions (Addendum)

## 2012-03-09 NOTE — Assessment & Plan Note (Signed)
The patient had an ejection fraction in the range of 35-40% in April, 2011. Since that time he has been on appropriate medications and has had very little alcohol intake. We do need to reassess his left ventricular function with echocardiogram. He has asked me if I would be willing to help him with paper so that he can resume driving. First we will need to know what his LV function is.

## 2012-03-09 NOTE — Assessment & Plan Note (Signed)
Patient is on appropriate medications. No change in therapy. 

## 2012-03-09 NOTE — Assessment & Plan Note (Signed)
The patient has not had any recurrent pancreatitis. He is reminded again to try to abstain completely from alcohol.

## 2012-03-17 ENCOUNTER — Ambulatory Visit (HOSPITAL_COMMUNITY): Payer: Medicare Other | Attending: Cardiovascular Disease

## 2012-03-17 DIAGNOSIS — I379 Nonrheumatic pulmonary valve disorder, unspecified: Secondary | ICD-10-CM | POA: Insufficient documentation

## 2012-03-17 DIAGNOSIS — I369 Nonrheumatic tricuspid valve disorder, unspecified: Secondary | ICD-10-CM | POA: Insufficient documentation

## 2012-03-17 DIAGNOSIS — I059 Rheumatic mitral valve disease, unspecified: Secondary | ICD-10-CM | POA: Insufficient documentation

## 2012-03-17 DIAGNOSIS — R9430 Abnormal result of cardiovascular function study, unspecified: Secondary | ICD-10-CM

## 2012-03-17 DIAGNOSIS — IMO0002 Reserved for concepts with insufficient information to code with codable children: Secondary | ICD-10-CM

## 2012-03-17 DIAGNOSIS — J449 Chronic obstructive pulmonary disease, unspecified: Secondary | ICD-10-CM | POA: Insufficient documentation

## 2012-03-17 DIAGNOSIS — F17200 Nicotine dependence, unspecified, uncomplicated: Secondary | ICD-10-CM | POA: Insufficient documentation

## 2012-03-17 DIAGNOSIS — I509 Heart failure, unspecified: Secondary | ICD-10-CM

## 2012-03-17 DIAGNOSIS — I428 Other cardiomyopathies: Secondary | ICD-10-CM

## 2012-03-17 DIAGNOSIS — J4489 Other specified chronic obstructive pulmonary disease: Secondary | ICD-10-CM | POA: Insufficient documentation

## 2012-03-17 NOTE — Progress Notes (Signed)
Echocardiogram performed.  

## 2012-03-30 ENCOUNTER — Other Ambulatory Visit: Payer: Self-pay | Admitting: Cardiology

## 2012-03-30 ENCOUNTER — Telehealth: Payer: Self-pay | Admitting: Cardiology

## 2012-03-30 NOTE — Telephone Encounter (Signed)
No answer and no way to leave message. Will continue to call./LV 

## 2012-03-30 NOTE — Telephone Encounter (Signed)
Pt returning nurse call, he can be reached at [phone]

## 2012-04-15 ENCOUNTER — Other Ambulatory Visit: Payer: Self-pay | Admitting: Cardiology

## 2012-04-17 ENCOUNTER — Other Ambulatory Visit: Payer: Self-pay | Admitting: Cardiology

## 2012-09-07 ENCOUNTER — Other Ambulatory Visit: Payer: Self-pay

## 2012-09-07 MED ORDER — FUROSEMIDE 80 MG PO TABS: 80.0000 mg | ORAL_TABLET | Freq: Every day | ORAL | Status: DC

## 2013-05-11 ENCOUNTER — Other Ambulatory Visit: Payer: Self-pay

## 2013-05-11 MED ORDER — CARVEDILOL 12.5 MG PO TABS: ORAL_TABLET | ORAL | Status: DC

## 2013-05-25 ENCOUNTER — Other Ambulatory Visit: Payer: Self-pay

## 2013-05-25 MED ORDER — FUROSEMIDE 80 MG PO TABS: 80.0000 mg | ORAL_TABLET | Freq: Every day | ORAL | Status: DC

## 2013-07-23 ENCOUNTER — Other Ambulatory Visit: Payer: Self-pay

## 2013-07-23 MED ORDER — ENALAPRIL MALEATE 5 MG PO TABS
ORAL_TABLET | ORAL | Status: DC
Start: 2013-07-23 — End: 2014-01-14

## 2013-07-23 MED ORDER — CARVEDILOL 12.5 MG PO TABS: ORAL_TABLET | ORAL | Status: DC

## 2013-08-16 ENCOUNTER — Encounter: Payer: Self-pay | Admitting: Cardiology

## 2013-08-16 DIAGNOSIS — I272 Pulmonary hypertension, unspecified: Secondary | ICD-10-CM | POA: Insufficient documentation

## 2013-08-16 DIAGNOSIS — I5022 Chronic systolic (congestive) heart failure: Secondary | ICD-10-CM | POA: Insufficient documentation

## 2013-08-17 ENCOUNTER — Ambulatory Visit: Payer: Medicare Other | Admitting: Cardiology

## 2013-08-20 ENCOUNTER — Other Ambulatory Visit: Payer: Self-pay

## 2013-08-20 MED ORDER — FUROSEMIDE 80 MG PO TABS: 80.0000 mg | ORAL_TABLET | Freq: Every day | ORAL | Status: DC

## 2013-09-17 ENCOUNTER — Encounter: Payer: Self-pay | Admitting: Cardiology

## 2013-09-20 ENCOUNTER — Encounter: Payer: Self-pay | Admitting: Cardiology

## 2013-09-20 ENCOUNTER — Ambulatory Visit (INDEPENDENT_AMBULATORY_CARE_PROVIDER_SITE_OTHER): Payer: Medicare Other | Admitting: Cardiology

## 2013-09-20 VITALS — BP 112/74 | HR 75 | Ht 66.0 in | Wt 121.0 lb

## 2013-09-20 DIAGNOSIS — R4702 Dysphasia: Secondary | ICD-10-CM | POA: Insufficient documentation

## 2013-09-20 DIAGNOSIS — I2510 Atherosclerotic heart disease of native coronary artery without angina pectoris: Secondary | ICD-10-CM

## 2013-09-20 DIAGNOSIS — K861 Other chronic pancreatitis: Secondary | ICD-10-CM

## 2013-09-20 DIAGNOSIS — I2789 Other specified pulmonary heart diseases: Secondary | ICD-10-CM

## 2013-09-20 DIAGNOSIS — I5022 Chronic systolic (congestive) heart failure: Secondary | ICD-10-CM

## 2013-09-20 DIAGNOSIS — I272 Pulmonary hypertension, unspecified: Secondary | ICD-10-CM

## 2013-09-20 DIAGNOSIS — R4789 Other speech disturbances: Secondary | ICD-10-CM

## 2013-09-20 DIAGNOSIS — I509 Heart failure, unspecified: Secondary | ICD-10-CM

## 2013-09-20 DIAGNOSIS — I428 Other cardiomyopathies: Secondary | ICD-10-CM

## 2013-09-20 LAB — BASIC METABOLIC PANEL
BUN: 10 mg/dL (ref 6–23)
CALCIUM: 9.7 mg/dL (ref 8.4–10.5)
CO2: 28 meq/L (ref 19–32)
Chloride: 99 mEq/L (ref 96–112)
Creatinine, Ser: 1.6 mg/dL — ABNORMAL HIGH (ref 0.4–1.5)
GFR: 56.63 mL/min — AB (ref >60.00)
GLUCOSE: 85 mg/dL (ref 70–99)
POTASSIUM: 4 meq/L (ref 3.5–5.1)
SODIUM: 135 meq/L (ref 135–145)

## 2013-09-20 MED ORDER — CARVEDILOL 12.5 MG PO TABS: ORAL_TABLET | ORAL | Status: DC

## 2013-09-20 NOTE — Patient Instructions (Signed)
**Note De-identified Michael Keith Obfuscation** Your physician recommends that you continue on your current medications as directed. Please refer to the Current Medication list given to you today.  Your physician recommends that you return for lab work in: today  Your physician wants you to follow-up in: 6 months. You will receive a reminder letter in the mail two months in advance. If you don't receive a letter, please call our office to schedule the follow-up appointment.   

## 2013-09-20 NOTE — Assessment & Plan Note (Signed)
He's having some swallowing difficulties. He thinks the food is becoming lodged in the left side of his throat. I've encouraged him to see his primary physicians.

## 2013-09-20 NOTE — Assessment & Plan Note (Signed)
His volume status seems stable today. Chemistry will be checked. No change in therapy.

## 2013-09-20 NOTE — Assessment & Plan Note (Signed)
Patient has severe left ventricular dysfunction. He did have a followup echo in September, 2013. His ejection fraction at that time was 35-40%. The plan was to see him for further followup. He's not been seen in the office since September, 2013. He is on an ACE inhibitor and carvedilol. He is on diuretic. We will check his chemistry function. The patient is not a candidate for ICD. Unfortunately his followup is to unreliable.

## 2013-09-20 NOTE — Progress Notes (Signed)
Patient ID: Michael Keith, male   DOB: 1950/12/26, 63 y.o.   MRN: [id number]    HPI  Patient is seen in followup his cardiac status. He has known cardiopathy. There has been an alcohol component over time. I think he takes his medicines. He was concerned that his blood pressure was low in October, 2014. His pressure is stable today. He is most concerned about a sensation of food becoming lodged in the left side of his throat when he swallows. He's not having nausea or vomiting.  No Known Allergies  Current Outpatient Prescriptions  Medication Sig Dispense Refill  . aspirin 81 MG tablet Take 81 mg by mouth daily.        . carvedilol (COREG) 12.5 MG tablet take 1 tablet by mouth twice a day  60 tablet  0  . enalapril (VASOTEC) 5 MG tablet take 1 tablet by mouth twice a day  180 tablet  1  . furosemide (LASIX) 80 MG tablet Take 1 tablet (80 mg total) by mouth daily.  15 tablet  0  . magnesium oxide (MAG-OX) 400 (241.3 MG) MG tablet take 1 tablet by mouth twice a day  60 tablet  3  . multivitamin (THERAGRAN) per tablet Take 1 tablet by mouth daily.        . nitroGLYCERIN (NITROSTAT) 0.4 MG SL tablet Place 0.4 mg under the tongue every 5 (five) minutes as needed.        . Thiamine HCl (VITAMIN B-1) 100 MG tablet Take 100 mg by mouth daily.         No current facility-administered medications for this visit.    History   Social History  . Marital Status: Single    Spouse Name: N/A    Number of Children: N/A  . Years of Education: N/A   Occupational History  . Not on file.   Social History Main Topics  . Smoking status: Current Every Day Smoker -- 0.50 packs/day for 30 years    Types: Cigarettes  . Smokeless tobacco: Not on file  . Alcohol Use: Yes     Comment: heavy untill Jan 2011  . Drug Use: No  . Sexual Activity: Not on file   Other Topics Concern  . Not on file   Social History Narrative  . No narrative on file    Family History  Problem Relation Age of Onset  .  Diabetes Cousin     Past Medical History  Diagnosis Date  . Chronic pancreatitis     CT scan shows improving pancreatitis  . Nonischemic cardiomyopathy 09/2009    Catheterization, April, 2011, questionable occlusion of the most apical portion of the LAD, LV dysfunction out of proportion to coronary disease  . Chronic systolic CHF (congestive heart failure)     April, 2011 mild troponin elevation at that time  . Mitral regurgitation     mild, echo April 2011  . Tobacco abuse   . Alcohol use     heavy until Jan 2011, patient was counseled concerning alcohol in April 2011  . COPD (chronic obstructive pulmonary disease)   . Fluid overload 03/2010    October, 2011  . Cough Jan 2012    may be from enalapril, January, 2012  . Ejection fraction < 50%     EF 35-40%, echo, April, 2011  . CAD (coronary artery disease)     Catheterization, April, 2011, questionable occlusion of the most apical portion of the LAD, LV dysfunction out of proportion  to coronary disease    Past Surgical History  Procedure Laterality Date  . Cardiac catheterization  April 2011    questionable occlusion of the most apical portion of the LAD / LV dysfunction out of proportion to coronary disease  . Wrist surgey      left wrist    Patient Active Problem List   Diagnosis Date Noted  . Pulmonary hypertension 08/16/2013  . Chronic systolic CHF (congestive heart failure)   . Chronic pancreatitis   . Mitral regurgitation   . Tobacco abuse   . Alcohol use   . COPD (chronic obstructive pulmonary disease)   . Ejection fraction < 50%   . CAD (coronary artery disease)   . Cough 07/01/2010  . Fluid overload 03/31/2010  . Nonischemic cardiomyopathy 09/29/2009    ROS   Patient denies fever, chills, headache, sweats, rash, change in vision, change in hearing, chest pain, cough, nausea vomiting, urinary symptoms. All other systems are reviewed and are negative.  PHYSICAL EXAM  Patient is oriented to person time and  place. Affect is normal. There is no jugulovenous distention. He has poor dentition. Lungs are clear. Respiratory effort is nonlabored. Cardiac exam reveals S1 and S2. There no clicks or significant murmurs. The abdomen is soft. There is trace peripheral edema.  Filed Vitals:   09/20/13 1359  BP: 112/74  Pulse: 75  Height: 5\' 6"  (1.676 m)  Weight: 121 lb (54.885 kg)    EKG is done today and reviewed by me. There sinus rhythm. There is scattered PVCs.  ASSESSMENT & PLAN

## 2013-09-20 NOTE — Assessment & Plan Note (Signed)
Has a history of chronic pancreatitis. I do not see any records of recent admissions for this.

## 2013-09-21 ENCOUNTER — Other Ambulatory Visit: Payer: Self-pay

## 2013-09-21 MED ORDER — FUROSEMIDE 40 MG PO TABS: 40.0000 mg | ORAL_TABLET | Freq: Every day | ORAL | Status: DC

## 2013-11-29 ENCOUNTER — Other Ambulatory Visit: Payer: Self-pay | Admitting: Gastroenterology

## 2013-11-29 DIAGNOSIS — R1310 Dysphagia, unspecified: Secondary | ICD-10-CM

## 2013-12-01 ENCOUNTER — Ambulatory Visit
Admission: RE | Admit: 2013-12-01 | Discharge: 2013-12-01 | Disposition: A | Discharge status: Home / Self Care | Payer: Medicare Other | Source: Ambulatory Visit | Attending: Gastroenterology | Admitting: Gastroenterology

## 2013-12-01 DIAGNOSIS — R1310 Dysphagia, unspecified: Secondary | ICD-10-CM

## 2013-12-06 ENCOUNTER — Telehealth: Payer: Self-pay | Admitting: Oncology

## 2013-12-06 NOTE — Telephone Encounter (Signed)
S/W PATIENT AND GAVE NP APPT FOR 06/11 @ 1:30 W/DR. SHADAD.  [facility] PACKET MAILED.

## 2013-12-06 NOTE — Telephone Encounter (Signed)
C/D 12/06/13 for appt.12/09/13

## 2013-12-07 ENCOUNTER — Other Ambulatory Visit: Payer: Self-pay | Admitting: Oncology

## 2013-12-07 DIAGNOSIS — C159 Malignant neoplasm of esophagus, unspecified: Secondary | ICD-10-CM

## 2013-12-09 ENCOUNTER — Other Ambulatory Visit (HOSPITAL_BASED_OUTPATIENT_CLINIC_OR_DEPARTMENT_OTHER): Payer: Medicare Other

## 2013-12-09 ENCOUNTER — Encounter: Payer: Self-pay | Admitting: Oncology

## 2013-12-09 ENCOUNTER — Telehealth: Payer: Self-pay | Admitting: Oncology

## 2013-12-09 ENCOUNTER — Ambulatory Visit: Payer: Medicare Other

## 2013-12-09 ENCOUNTER — Ambulatory Visit (HOSPITAL_BASED_OUTPATIENT_CLINIC_OR_DEPARTMENT_OTHER): Payer: Medicare Other | Admitting: Oncology

## 2013-12-09 VITALS — BP 117/82 | HR 72 | Temp 97.4°F | Resp 18 | Ht 66.0 in | Wt 106.9 lb

## 2013-12-09 DIAGNOSIS — C153 Malignant neoplasm of upper third of esophagus: Secondary | ICD-10-CM

## 2013-12-09 DIAGNOSIS — C159 Malignant neoplasm of esophagus, unspecified: Secondary | ICD-10-CM

## 2013-12-09 LAB — COMPREHENSIVE METABOLIC PANEL (CC13)
ALBUMIN: 3.7 g/dL (ref 3.5–5.0)
ALT: 9 U/L (ref 0–55)
AST: 24 U/L (ref 5–34)
Alkaline Phosphatase: 70 U/L (ref 40–150)
Anion Gap: 11 mEq/L (ref 3–11)
BUN: 26.8 mg/dL — ABNORMAL HIGH (ref 7.0–26.0)
CALCIUM: 9.6 mg/dL (ref 8.4–10.4)
CHLORIDE: 104 meq/L (ref 98–109)
CO2: 25 mEq/L (ref 22–29)
CREATININE: 2.2 mg/dL — AB (ref 0.7–1.3)
GLUCOSE: 113 mg/dL (ref 70–140)
Potassium: 4.7 mEq/L (ref 3.5–5.1)
Sodium: 140 mEq/L (ref 136–145)
Total Bilirubin: 0.43 mg/dL (ref 0.20–1.20)
Total Protein: 8.1 g/dL (ref 6.4–8.3)

## 2013-12-09 LAB — CBC WITH DIFFERENTIAL/PLATELET
BASO%: 0.6 % (ref 0.0–2.0)
BASOS ABS: 0 10*3/uL (ref 0.0–0.1)
EOS ABS: 0.2 10*3/uL (ref 0.0–0.5)
EOS%: 4.6 % (ref 0.0–7.0)
HEMATOCRIT: 35.5 % — AB (ref 38.4–49.9)
HEMOGLOBIN: 11.9 g/dL — AB (ref 13.0–17.1)
LYMPH#: 1.3 10*3/uL (ref 0.9–3.3)
LYMPH%: 36.7 % (ref 14.0–49.0)
MCH: 29.4 pg (ref 27.2–33.4)
MCHC: 33.5 g/dL (ref 32.0–36.0)
MCV: 87.7 fL (ref 79.3–98.0)
MONO#: 0.5 10*3/uL (ref 0.1–0.9)
MONO%: 14.3 % — AB (ref 0.0–14.0)
NEUT%: 43.8 % (ref 39.0–75.0)
NEUTROS ABS: 1.5 10*3/uL (ref 1.5–6.5)
Platelets: 127 10*3/uL — ABNORMAL LOW (ref 140–400)
RBC: 4.05 10*6/uL — ABNORMAL LOW (ref 4.20–5.82)
RDW: 13.6 % (ref 11.0–14.6)
WBC: 3.5 10*3/uL — ABNORMAL LOW (ref 4.0–10.3)

## 2013-12-09 NOTE — Progress Notes (Signed)
Please see consult note.  

## 2013-12-09 NOTE — Progress Notes (Signed)
Checked in new patient with no financial issues. He has appt card and had not been [place] of the country. He has not seen the dr.

## 2013-12-09 NOTE — Telephone Encounter (Signed)
gv adn printed appt sched and avs for pt fro July.Marland KitchenMarland KitchenPer [facility] Isidore Moos app[facility].Marland Kitcheni did not need to sched

## 2013-12-09 NOTE — Consult Note (Signed)
Reason for Referral: Esophageal cancer.   HPI: 63 year old African American gentleman currently of [place] where he lived the majority of his life. He is a gentleman with history of nonischemic cardiomyopathy as well as polysubstance abuse. He has a long-standing history of heavy alcohol and smoking history. He reports he have slowed down on his smoking and drinking recently and quit since Monday. He used to drink beer is predominantly on the weekend and at times liquor. He have had withdrawal symptoms but no seizures. He have also have had previous episodes of intoxication. In the last few months, he had noted symptoms of dysphagia, abdominal aphasia and about 20 pound weight loss. He does have history of pancreatitis and was seen by Dr. Michail Sermon and was referred back again in June of 2015. He underwent esophogram and a barium swallow on 12/01/2013 which showed a mucosal irregularity and luminal narrowing consistent with malignancy. The patient was able to demonstrate penetration of barium and faint aspiration without cough response. He underwent upper endoscopy on 12/03/2013 and he was found to have a large fungating and also a mass in the hypopharynx. The mass is partially obstructing the airway. Biopsies were obtained and the pathology showed invasive squamous cell carcinoma. (Case number [id number]). Patient referred to me for an evaluation.  Clinically, he does not report any headaches or blurry vision or double vision. Has not reported any syncope or otalgia. He does report dysphagia although odynophagia. He does report 20 pound weight loss. He does report chest pressure that has been chronic in nature. He does not report any fevers, chills, sweats or any other constitutional symptoms. He does not report any dyspnea on exertion or shortness of breath. He does not report any nausea or vomiting. He is able to get liquid in specially thickened material was low regurgitation. He is unable  to swallow any solid food at this time. He does not report any abdominal distention or early satiety. He does not report any constipation or diarrhea. He does not report any frequency urgency or hesitancy. He does not report any hematuria. He does not report any muscle pain or bone discomfort. Is not report any lymphadenopathy or petechiae. He did not report any skin rashes or lesions. He continues to live independently but relies on his neighbor and family for transportation. He does not drive has a reasonable quality of life at this time.   Past Medical History  Diagnosis Date  . Chronic pancreatitis     CT scan shows improving pancreatitis  . Nonischemic cardiomyopathy 09/2009    Catheterization, April, 2011, questionable occlusion of the most apical portion of the LAD, LV dysfunction out of proportion to coronary disease  . Chronic systolic CHF (congestive heart failure)     April, 2011 mild troponin elevation at that time  . Mitral regurgitation     mild, echo April 2011  . Tobacco abuse   . Alcohol use     heavy until Jan 2011, patient was counseled concerning alcohol in April 2011  . COPD (chronic obstructive pulmonary disease)   . Fluid overload 03/2010    October, 2011  . Cough Jan 2012    may be from enalapril, January, 2012  . Ejection fraction < 50%     EF 35-40%, echo, April, 2011  . CAD (coronary artery disease)     Catheterization, April, 2011, questionable occlusion of the most apical portion of the LAD, LV dysfunction out of proportion to coronary disease  :  Past Surgical History  Procedure Laterality Date  . Cardiac catheterization  April 2011    questionable occlusion of the most apical portion of the LAD / LV dysfunction out of proportion to coronary disease  . Wrist surgey      left wrist  :   Current Outpatient Prescriptions  Medication Sig Dispense Refill  . aspirin 81 MG tablet Take 81 mg by mouth daily.        . carvedilol (COREG) 12.5 MG tablet take 1  tablet by mouth twice a day  60 tablet  6  . enalapril (VASOTEC) 5 MG tablet take 1 tablet by mouth twice a day  180 tablet  1  . furosemide (LASIX) 40 MG tablet Take 1 tablet (40 mg total) by mouth daily.  30 tablet  3  . magnesium oxide (MAG-OX) 400 (241.3 MG) MG tablet take 1 tablet by mouth twice a day  60 tablet  3  . multivitamin (THERAGRAN) per tablet Take 1 tablet by mouth daily.        . nitroGLYCERIN (NITROSTAT) 0.4 MG SL tablet Place 0.4 mg under the tongue every 5 (five) minutes as needed.        . Thiamine HCl (VITAMIN B-1) 100 MG tablet Take 100 mg by mouth daily.         No current facility-administered medications for this visit.      No Known Allergies:  Family History  Problem Relation Age of Onset  . Diabetes Cousin   :  History   Social History  . Marital Status: Single    Spouse Name: N/A    Number of Children: N/A  . Years of Education: N/A   Occupational History  . Not on file.   Social History Main Topics  . Smoking status: Current Every Day Smoker -- 0.50 packs/day for 30 years    Types: Cigarettes  . Smokeless tobacco: Not on file  . Alcohol Use: Yes     Comment: heavy untill Jan 2011  . Drug Use: No  . Sexual Activity: Not on file   Other Topics Concern  . Not on file   Social History Narrative  . No narrative on file  :  Pertinent items are noted in HPI.  Exam: Blood pressure 117/82, pulse 72, temperature 97.4 F (36.3 C), temperature source Oral, resp. rate 18, height 5\' 6"  (1.676 m), weight 106 lb 14.4 oz (48.49 kg). General appearance: alert and cooperative Head: Normocephalic, without obvious abnormality Nose: Nares normal. Septum midline. Mucosa normal. No drainage or sinus tenderness. Throat: lips, mucosa, and tongue normal; teeth and gums normal Neck: no adenopathy Back: negative Resp: clear to auscultation bilaterally Chest wall: no tenderness Cardio: regular rate and rhythm, S1, S2 normal, no murmur, click, rub or  gallop GI: soft, non-tender; bowel sounds normal; no masses,  no organomegaly Extremities: extremities normal, atraumatic, no cyanosis or edema Pulses: 2+ and symmetric Skin: Skin color, texture, turgor normal. No rashes or lesions Lymph nodes: Cervical, supraclavicular, and axillary nodes normal. Neurologic: Grossly normal   Recent Labs  12/09/13 1333  WBC 3.5*  HGB 11.9*  HCT 35.5*  PLT 127 Large platelets present*    Recent Labs  12/09/13 1333  NA 140  K 4.7  CO2 25  GLUCOSE 113  BUN 26.8*  CREATININE 2.2*  CALCIUM 9.6     Dg Esophagus  12/01/2013   CLINICAL DATA:  Dysphagia  EXAM: ESOPHOGRAM/BARIUM SWALLOW  TECHNIQUE: Combined double contrast and single contrast examination  performed using effervescent crystals, thick barium liquid, and thin barium liquid.  FLUOROSCOPY TIME:  1 min 12 seconds  COMPARISON:  None.  FINDINGS: With the question of possible aspiration this study was begun in the lateral projection with single barium and rapid sequence spot films. No aspiration was seen. However, there is irregularity of the mucosa at the cervicothoracic junction noted. At this level, the lumen of the cervicothoracic junction is significantly narrowed, and this is most consistent with esophageal carcinoma. This does result in slight obstruction to the passage of barium more distally, and the patient did demonstrate mild penetration and faint aspiration at the end of the study without cough response. In view of this finding, a barium tablet was not given. The more distal esophagus was unremarkable. No hiatal hernia was demonstrated.  IMPRESSION: 1. Mucosal irregularity and luminal narrowing at the cervicothoracic junction most consistent with esophageal carcinoma. 2. At the end of the study the patient did demonstrate penetration of barium and faint aspiration without cough response.   Electronically Signed   By: Ivar Drape M.D.   On: 12/01/2013 14:43    Assessment and Plan:    63 year old gentleman with the following issues:  1. 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 He presented with dysphagia with odynophagia and 20 pound weight loss. On 12/03/2013 he underwent an endoscopy and showed a large tumor that is biopsy proven to to be squamous cell carcinoma. No staging studies have been completed at this time. The natural course, etiology, and treatment was discussed today with the patient extensively. Most likely his tumor is related to polysubstance abuse.  From a management standpoint, this will need to be treated predominantly as head and neck tumors given the proximity of this tumor to the hypopharynx. The fact that it is squamous cell in its etiology does not really dictates that treatment approach is much at this point. He will require multimodality treatment in any case. I do not think he is a surgical candidate given the bulk of his tumor, comorbid conditions and possible nutritional status. Combined radiation therapy with chemotherapy would be the preferred method at this time. I'll make the appropriate referral for radiation oncology as well as the head and neck cancer multidisciplinary clinic.  The logistics of administration concomitant chemotherapy with radiation was discussed with the patient today. Likely he will need a platinum agent either single agent cis-platinum or combination of carboplatin and Taxol. Administration of these drugs as well as complications were explained to the patient today. Complications would include nausea, vomiting, myelosuppression, neutropenia, neutropenic sepsis and possible need for hospitalization and intravenous antibiotics. Rarely will require severe and prolonged hospitalization and rarely death. I think it would be reasonable to consider a carboplatin with taxanes weekly with radiation therapy if his health as a candidate.  We will schedule a PET/CT scan as well for staging purposes.   2. IV access: I discussed  with him the possible need for a Port-A-Cath insertion. Complications include bleeding, thrombosis and possible infection.  3. Nutritional status: He is at increased risk for malnutrition given his tumor and his polysubstance abuse. He will be evaluated by a nutritionist as a part of the outside disciplinary clinic. We also discussed with him the possible need for a PEG tube to prevent severe malnutrition.  4. Psychosocial support: He appears to have rather poor social situation at this time. An evaluation by social work also will be critical moving following.  5. Increased BUN and creatinine: This is undoubtedly  related to poor hydration. She is already on Lasix for congestive heart failure and this might have to be held in the future. We'll have to monitor this closely with his congestive heart failure as you at risk of developing decompensated heart failure with aggressive hydration.  6. Poor dentition: He'll likely need a dental evaluation prior to start his treatments.  The patient was also referred to [facility] RN; the head and neck cancer navigator to assess this patient and his needs.

## 2013-12-11 NOTE — Progress Notes (Signed)
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   HISTORY OF PRESENT ILLNESS::Michael Keith is a 63 y.o. male with history of ETOH and tobacco abuse who presented with dysphagia with odynophagia and 20 pound weight loss. On 11-29-13, Esophogram and Barium Swallow showed mucosal irregularity and luminal narrowing at the cervicothoracic junction most consistent with esophageal carcinoma. At the end of the study the patient did demonstrate penetration of barium and faint aspiration without cough response  On 12/03/2013 he underwent an endoscopy by Dr Michail Sermon, which per his report showed a large fungating ulcerative mass in the hypopharynx, partially obstructing the airway. This appeared malignant. Also partially obstructing malignant esophageal tumor at the cricopharyngeus in the upper third of the esophagus. This was circumferential. This extended from 16-22 cm from the incisors.. Biopsy of the upper esophageal lesion revealed invasive squamous cell carcinoma.  He has seen Dr Alen Blew, who feels that from a management standpoint, this will need to be treated predominantly as head and neck tumors given the proximity of this tumor to the hypopharynx. He does not feel the patient is a surgical candidate given the bulk of his tumor, comorbid conditions and possible nutritional status. Therefore, he feels that radiation therapy with chemotherapy would be the preferred method at this time. He has plans to prescribe either single agent cis-platinum or combination of carboplatin and Taxol.  PET/CT scan has been ordered by Dr Alen Blew as well for staging purposes - 12-21-13 date for this  study  Swallowing difficulty is reported by Michael Keith: with solids more than liquids.  He has lost about 20 lbs over 8 wks.  No hemoptysis. He has transportation issues.  He reports he is not aware that he has cancer, but that he knows he has "a growth."  PREVIOUS RADIATION THERAPY: No  PAST MEDICAL HISTORY:  has a past medical history of Chronic pancreatitis; Nonischemic cardiomyopathy (09/2009); Chronic systolic CHF (congestive heart failure); Mitral regurgitation; Tobacco abuse; Alcohol use; COPD (chronic obstructive pulmonary disease); Fluid overload (03/2010); Cough (Jan 2012); Ejection fraction < 50%; CAD (coronary artery disease); and Squamous cell esophageal cancer.    PAST SURGICAL HISTORY: Past Surgical History  Procedure Laterality Date  . Cardiac catheterization  April 2011    questionable occlusion of the most apical portion of the LAD / LV dysfunction out of proportion to coronary disease  . Wrist surgey      left wrist    FAMILY HISTORY: family history includes Diabetes in his cousin.  SOCIAL HISTORY:  reports that he has been smoking Cigarettes.  He has a 15 pack-year smoking history. He does not have any smokeless tobacco history on file. He reports that he drinks alcohol. He reports that he does not use illicit drugs.  ALLERGIES: Review of patient's allergies indicates no known allergies.  MEDICATIONS:  Current Outpatient Prescriptions  Medication Sig Dispense Refill  . aspirin 81 MG tablet Take 81 mg by mouth daily.        . carvedilol (COREG) 12.5 MG tablet take 1 tablet by mouth twice a day  60 tablet  6  . enalapril (VASOTEC) 5 MG tablet take 1 tablet by mouth twice a day  180 tablet  1  . furosemide (LASIX) 40 MG tablet Take 1 tablet (40 mg total) by mouth daily.  30 tablet  3  . magnesium oxide (MAG-OX) 400 (241.3 MG) MG tablet take 1 tablet by mouth twice a day  60 tablet  3  . Thiamine HCl (VITAMIN B-1) 100 MG tablet Take 100 mg by mouth daily.        .  multivitamin (THERAGRAN) per tablet Take 1 tablet by mouth daily.        . nitroGLYCERIN (NITROSTAT) 0.4 MG SL tablet Place 0.4 mg under the tongue every 5 (five) minutes as needed.         Current Facility-Administered Medications  Medication Dose Route Frequency Provider Last Rate Last Dose  . laryngocopy solution for Rad-Onc  15 mL Topical Once Eppie Gibson, MD        REVIEW OF SYSTEMS:  Notable for that above.   PHYSICAL EXAM:  height is _0  (1.676 m) and weight is 106 lb 14.4 oz (48.49 kg). His temperature is 97.7 F (36.5 C). His blood pressure is 91/66 and his pulse is 69.   General: Alert and oriented, in no acute distress; thin HEENT: Head is normocephalic.  Extraocular movements are intact. Oropharynx is moist, clear. 4 teeth, lower gum, in poor repair. Neck: Neck is supple, no palpable cervical or supraclavicular lymphadenopathy. Heart: Regular in rate and rhythm  Chest: decreased sounds bilaterally Abdomen: Soft, nontender, nondistended, with no rigidity or guarding. Extremities: No cyanosis or edema. Lymphatics: No concerning lymphadenopathy. Skin: No concerning lesions. Musculoskeletal: symmetric strength and muscle tone throughout. ambulatory Neurologic: Cranial nerves II through XII are grossly intact. No obvious focalities. Speech is fluent. Coordination is intact. Psychiatric:  Affect is appropriate.  PROCEDURE NOTE: After anesthetizing the nasal cavity with topical lidocaine and oxymetazoline, the flexible endoscope was introduced and passed through the nasal cavity. However, patient could not stay still or tolerate further examination. Laryngoscope was withdrawan.  ECOG = 1  0 - Asymptomatic (Fully active, able to carry on all predisease activities without restriction)  1 - Symptomatic but completely ambulatory (Restricted in physically strenuous activity but ambulatory and able to carry out work of a light or sedentary nature. For example, light housework, office  work)  2 - Symptomatic, <50% in bed during the day (Ambulatory and capable of all self care but unable to carry out any work activities. Up and about more than 50% of waking hours)  3 - Symptomatic, >50% in bed, but not bedbound (Capable of only limited self-care, confined to bed or chair 50% or more of waking hours)  4 - Bedbound (Completely disabled. Cannot carry on any self-care. Totally confined to bed or chair)  5 - Death   Eustace Pen MM, Creech RH, Tormey DC, et al. [phone]). "Toxicity and response criteria of the [facility]". [place] Oncol. 5 (6): 649-55   LABORATORY DATA:  Lab Results  Component Value Date   WBC 3.5* 12/09/2013   HGB 11.9* 12/09/2013   HCT 35.5* 12/09/2013   MCV 87.7 12/09/2013   PLT 127 Large platelets present* 12/09/2013   CMP     Component Value Date/Time   NA 140 12/09/2013 1333   NA 135 09/20/2013 1441   K 4.7 12/09/2013 1333   K 4.0 09/20/2013 1441   CL 99 09/20/2013 1441   CO2 25 12/09/2013 1333   CO2 28 09/20/2013 1441   GLUCOSE 113 12/09/2013 1333   GLUCOSE 85 09/20/2013  1441   BUN 26.8* 12/09/2013 1333   BUN 10 09/20/2013 1441   CREATININE 2.2* 12/09/2013 1333   CREATININE 1.6* 09/20/2013 1441   CALCIUM 9.6 12/09/2013 1333   CALCIUM 9.7 09/20/2013 1441   PROT 8.1 12/09/2013 1333   PROT 7.5 04/25/2010 1219   ALBUMIN 3.7 12/09/2013 1333   ALBUMIN 2.7* 04/25/2010 1219   AST 24 12/09/2013 1333   AST 71* 04/25/2010 1219   ALT 9 12/09/2013 1333   ALT 95* 04/25/2010 1219   ALKPHOS 70 12/09/2013 1333   ALKPHOS 164* 04/25/2010 1219   BILITOT 0.43 12/09/2013 1333   BILITOT 1.2 04/25/2010 1219   GFRNONAA >60 05/02/2010 0505   GFRAA  Value: >60        The eGFR has been calculated using the MDRD equation. This calculation has not been validated in all clinical situations. eGFR's persistently <60 mL/min signify possible Chronic Kidney Disease. 05/02/2010 0505         RADIOGRAPHY: Dg Esophagus  12/01/2013   CLINICAL DATA:  Dysphagia  EXAM:  ESOPHOGRAM/BARIUM SWALLOW  TECHNIQUE: Combined double contrast and single contrast examination performed using effervescent crystals, thick barium liquid, and thin barium liquid.  FLUOROSCOPY TIME:  1 min 12 seconds  COMPARISON:  None.  FINDINGS: With the question of possible aspiration this study was begun in the lateral projection with single barium and rapid sequence spot films. No aspiration was seen. However, there is irregularity of the mucosa at the cervicothoracic junction noted. At this level, the lumen of the cervicothoracic junction is significantly narrowed, and this is most consistent with esophageal carcinoma. This does result in slight obstruction to the passage of barium more distally, and the patient did demonstrate mild penetration and faint aspiration at the end of the study without cough response. In view of this finding, a barium tablet was not given. The more distal esophagus was unremarkable. No hiatal hernia was demonstrated.  IMPRESSION: 1. Mucosal irregularity and luminal narrowing at the cervicothoracic junction most consistent with esophageal carcinoma. 2. At the end of the study the patient did demonstrate penetration of barium and faint aspiration without cough response.   Electronically Signed   By: Ivar Drape M.D.   On: 12/01/2013 14:43      IMPRESSION/PLAN: This is a delightful 63 year old man with stage pending for squamous cell carcinoma of the esophagus as well as a malignant appearing mass in hypopharynx per Dr Michail Sermon; PET is pending, positive ETOH abuse /  positive smoking history. The patient is a possible candidate for radiotherapy if PET indicates curable disease.  If his disease is metastatic, systemic therapy alone may be his best option. The patient has been discussed in detail at tumor board and seen in the context of multidisciplinary clinic today. Plan is as below:   1) The patient has met with med/onc to discuss chemotherapy - anticipate concurrent ChRT if PET  staging rules out distant mets.  He and I had a lengthy talk about his diagnosis. I explained that he has cancer, and acknowledged this is a serious disease. We spoke about the implications of the upcoming PET scan and how this could affect his staging, prognosis, and plans of care. Empathy and emotional support given today.  1a) PET pending for next week. If this reveals distant metastases, it could change our recommendations, as discussed with the patient   2) Referral has been made to dentistry for dental evaluation/extractions in preparation for radiation in the vicinity of the mouth.  But, extractions may  be less importance if chemotherapy alone is ultimately recommended based on PET results  3) Today in multidisciplinary clinic the patient will see Polo Riley from social work for social support - transportation issues are one problem for him.  I also think dealing with this diagnosis is a significant stressor for him.  4) Today in multidisciplinary clinic he will see nutrition for nutrition support - I anticipate PEG tube may be helpful in light of his dysphagia, malnutrition, and possible RT to the neck   5) I will refer to surgery for PEG tube placement. (see #4)    6) Will refer to swallowing therapy for evaluation and prophylactic treatment as needed for dysphagia, which can worsen during or after chemoradiotherapy.   7) PT saw him in [facility] clinic today  for pre-RT assessment / neck measurements due to risk of lymphedema in neck; may benefit from PT for this after completion of radiotherapy. The patient also may benefit from this for potentional deconditioning after treatment    8) Simulation once cleared by dentistry. Anticipate 7 weeks of RT - 70 Gy in 35 fractions.   9) The patient has a history of tobacco and ETOH abuse. The patient was counseled to abstain from these and I explained they could compromise his tolerance of his treatments and prognosis.  I offered pharmacotherapy and  further counseling to help with this. The patient declined pharmacotherapy and further counseling at this time.   It was a pleasure meeting the patient today. We discussed the risks, benefits, and side effects of radiotherapy.   We talked in detail about acute and late effects. He understands that some of the most bothersome acute effects will be significant soreness of the mouth and throat, changes in taste, changes in salivary function, skin irritation, hair loss, dehydration, weight loss and fatigue. We talked about late effects which include but are not necessarily limited to dysphagia, hypothyroidism, dry mouth, trismus, neck edema and nerve or spinal cord injury. No guarantees of treatment were given. A consent form was signed and placed in the patient's medical record. The patient is enthusiastic about proceeding with treatment. I look forward to participating in the patient's care.  _____________________________________________   Eppie Gibson, MD

## 2013-12-13 ENCOUNTER — Encounter: Payer: Self-pay | Admitting: *Deleted

## 2013-12-13 NOTE — Progress Notes (Signed)
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 He does have history of pancreatitis and was seen by Dr. Michail Sermon and was referred back again in June of 2015. He underwent esophogram and a barium swallow on 12/01/2013 which showed a mucosal irregularity and luminal narrowing consistent with malignancy. The patient was able to demonstrate penetration of barium and faint aspiration without cough response. He underwent upper endoscopy on 12/03/2013 and he was found to have a large fungating and also a mass in the hypopharynx. The mass is partially obstructing the airway. Biopsies were obtained and the pathology showed invasive squamous cell carcinoma. (Case number [id number])."   Biopsies of  (if applicable) revealed: Esophagus  Esophagus,Mass, Proximal 12/03/13 Invasive Squamous Cell Carcinoma   Biopsies were obtained and the pathology showed invasive squamous cell carcinoma. (Case number [id number])  Nutrition Status: Weight changes: 20 lb weight reported on 12/09/13  Swallowing status: Faint Aspiration seen after Barium Swallow.He does report dysphagia/odynophagia to food, but does have difficulty swallowing some of his oral medications. He is unable to swallow any solid food as reported on 12/09/13 but can eat soft foods, but has to chew it more than normal to facilitate swallowing.   Plans, if any, for PEG tube:   Tobacco/Marijuana/Snuff/ETOH use: 1/2 pack for 30 years, Social Drinking, No Illicit Drug use  Past/Anticipated interventions by otolaryngology, if any: Biopsy of the Hypopharynx  Past/Anticipated interventions by medical oncology, if any: Consultation with Dr. Mathis Dad. [place] 12/09/13- Chemotherapy  Referrals yet, to any of the following?  Social Work?    Dentistry?  Swallowing therapy?   Nutrition?   Med/Onc?   PEG placement?   SAFETY ISSUES:  Prior radiation? No       Pacemaker/ICD?No  Possible current pregnancy? N/A  Is the patient on methotrexate? No    Current Complaints / other details:   On 12/09/13 He "reported chest pressure that has been chronic in nature."  Nonischemic cardiomyopathy    He continues to live independently but relies on his neighbor and family for transportation. He does not drive.

## 2013-12-14 ENCOUNTER — Telehealth: Payer: Self-pay | Admitting: *Deleted

## 2013-12-14 NOTE — Telephone Encounter (Signed)
Called patient to ask if he had any questions prior to his attendance at the H&N [facility] tomorrow.  He denied.  I confirmed an arrival time of 12:15, he verbalized understanding.  He stated his cousin will be bringing him.  Continuing navigation as L1 patient (new patient).  [name], RN, BSN, [facility] Head & Neck Oncology Navigator [phone]

## 2013-12-14 NOTE — Progress Notes (Addendum)
Met with patient and his cousin, Michael Keith, during initial consult with Dr. Alen Blew.  Introduced myself as his Navigator, explained my role as a member of his Care Team, provided him my contact information, encouraged him to contact me at any time with questions/concerns.  He verbalized understanding.  Provide contact information to cousin as well.  Following appt with Dr. Alen Blew, showed them location of Dr. Ritta Slot office and [facility] as reference for future appts; explained arrival procedure for these locations compared to [facility] appts.   They verbalized understanding.  Mr. Michael Keith indicated he will be providing transportation for Mr. Michael Keith.  I am facilitating patient's referral to next week Wednesday's H&N [facility] where he will be seen by Dr. Isidore Moos, RadOnc MD;  Dory Peru, Nutritionist; Polo Riley, [place]; and Serafina Royals, PT Lymphedema Specialist.    Initiating navigation as L1 patient (new patient) with this encounter.  [name], RN, BSN, [facility] Head & Neck Oncology Navigator [phone]

## 2013-12-15 ENCOUNTER — Ambulatory Visit
Admission: RE | Admit: 2013-12-15 | Discharge: 2013-12-15 | Disposition: A | Discharge status: Home / Self Care | Payer: Medicare Other | Source: Ambulatory Visit | Attending: Radiation Oncology | Admitting: Radiation Oncology

## 2013-12-15 ENCOUNTER — Encounter: Payer: Self-pay | Admitting: Radiation Oncology

## 2013-12-15 ENCOUNTER — Ambulatory Visit: Payer: Medicare Other | Attending: Radiation Oncology | Admitting: Physical Therapy

## 2013-12-15 ENCOUNTER — Ambulatory Visit: Payer: Medicare Other | Admitting: Nutrition

## 2013-12-15 ENCOUNTER — Encounter: Payer: Self-pay | Admitting: *Deleted

## 2013-12-15 VITALS — BP 91/66 | HR 69 | Temp 97.7°F | Ht 66.0 in | Wt 106.9 lb

## 2013-12-15 DIAGNOSIS — I059 Rheumatic mitral valve disease, unspecified: Secondary | ICD-10-CM | POA: Insufficient documentation

## 2013-12-15 DIAGNOSIS — Z79899 Other long term (current) drug therapy: Secondary | ICD-10-CM | POA: Insufficient documentation

## 2013-12-15 DIAGNOSIS — IMO0001 Reserved for inherently not codable concepts without codable children: Secondary | ICD-10-CM | POA: Insufficient documentation

## 2013-12-15 DIAGNOSIS — I5022 Chronic systolic (congestive) heart failure: Secondary | ICD-10-CM | POA: Insufficient documentation

## 2013-12-15 DIAGNOSIS — I509 Heart failure, unspecified: Secondary | ICD-10-CM | POA: Insufficient documentation

## 2013-12-15 DIAGNOSIS — C138 Malignant neoplasm of overlapping sites of hypopharynx: Secondary | ICD-10-CM

## 2013-12-15 DIAGNOSIS — R1310 Dysphagia, unspecified: Secondary | ICD-10-CM | POA: Insufficient documentation

## 2013-12-15 DIAGNOSIS — C153 Malignant neoplasm of upper third of esophagus: Secondary | ICD-10-CM | POA: Insufficient documentation

## 2013-12-15 DIAGNOSIS — C159 Malignant neoplasm of esophagus, unspecified: Secondary | ICD-10-CM

## 2013-12-15 DIAGNOSIS — C139 Malignant neoplasm of hypopharynx, unspecified: Secondary | ICD-10-CM | POA: Insufficient documentation

## 2013-12-15 DIAGNOSIS — F17200 Nicotine dependence, unspecified, uncomplicated: Secondary | ICD-10-CM | POA: Insufficient documentation

## 2013-12-15 DIAGNOSIS — Z7982 Long term (current) use of aspirin: Secondary | ICD-10-CM | POA: Insufficient documentation

## 2013-12-15 DIAGNOSIS — F1010 Alcohol abuse, uncomplicated: Secondary | ICD-10-CM | POA: Insufficient documentation

## 2013-12-15 DIAGNOSIS — J4489 Other specified chronic obstructive pulmonary disease: Secondary | ICD-10-CM | POA: Insufficient documentation

## 2013-12-15 DIAGNOSIS — I2510 Atherosclerotic heart disease of native coronary artery without angina pectoris: Secondary | ICD-10-CM | POA: Insufficient documentation

## 2013-12-15 DIAGNOSIS — J449 Chronic obstructive pulmonary disease, unspecified: Secondary | ICD-10-CM | POA: Insufficient documentation

## 2013-12-15 DIAGNOSIS — R293 Abnormal posture: Secondary | ICD-10-CM | POA: Insufficient documentation

## 2013-12-15 MED ORDER — LARYNGOSCOPY SOLUTION RAD-ONC
15.0000 mL | Freq: Once | TOPICAL | Status: AC
  Administered 2013-12-15: 15 mL via TOPICAL
  Filled 2013-12-15: qty 15

## 2013-12-15 NOTE — Progress Notes (Signed)
Head & Neck Multidisciplinary Clinic Clinical Social Work  Clinical Social Work met with patient/family and radiation oncologist at head & neck multidisciplinary clinic to offer support and assess for psychosocial needs.  Patient was accompanied by his cousin Michael Keith.  The patient did not complete distress screen, but reviewed concerns with CSW.  Michael Keith is most concerned with "cancer" and dealing with treatment side effects.  Patient and CSW plan to meet at a later time to complete SCAT application and healthcare advance directives.  CSW will continue to follow throughout cancer journey.  ONCBCN DISTRESS SCREENING 12/15/2013  Screening Type Patient Declined    Clinical Social Work briefly discussed Clinical Social Work role and [employer] support programs/services.  Clinical Social Work encouraged patient to call with any additional questions or concerns.   Polo Riley, MSW, LCSW, OSW-C Clinical Social Worker [facility] [phone]

## 2013-12-15 NOTE — Progress Notes (Signed)
Nutrition consult, during head and neck clinic.  63 year old male diagnosed with esophagus cancer.  He is a patient of Dr. Alen Blew and Dr. Isidore Moos.  Plan is for weekly chemotherapy and radiation treatments.  Past medical history includes alcohol, tobacco, chronic pancreatitis, CHF, COPD, CAD.  Patient has an ejection fraction of less than 50%.  Medications include Lasix, magnesium oxide, vitamin B1.  Labs include BUN 26.8 and creatinine 2.2 on June 11.  Height: 66 inches. Weight: 106.9 pounds. Usual body weight: 128 pounds per patient. BMI: 17.26. (Underweight)  Estimated nutrition needs: 1600-1800 calories, 55-65 g protein, 1.6 L fluid.  Patient lives alone and receives one meal on wheels daily.  He typically does not eat other meals.  Patient has a cousin, who provides transportation.  Patient unable to eat solid foods.  He has a poor appetite.  He has tried to increase boost twice a day.  Patient meets criteria for severe malnutrition in the context of chronic illness secondary to greater than 7.5 percent weight loss over 3 months and less than 50% of energy intake for greater than 5 days.  Patient has moderate to severe depletion of both body fat and muscle mass.  Nutrition diagnosis: Unintended weight loss related to poor appetite and dysphasia as evidenced by 16% weight loss from usual body weight.  Intervention: Patient educated on the importance of increasing calories and protein in frequent small meals daily.  Recommended patient increase oral nutrition supplements to Ensure Plus or boost plus 4 times a day.  Patient educated on strategies for altering textures of foods to increase intake.  Encouraged patient to add sauces and gravies as needed to ease swallowing.  Provided patient with fact sheets, coupons, contact information, and one complementary case of Ensure Plus.  Teach back method used.  Monitoring, evaluation, goals: Patient will tolerate increased calories and protein to  promote weight gain.  Next visit: Will be scheduled weekly during chemotherapy.

## 2013-12-16 NOTE — Progress Notes (Signed)
To provide support and care continuity, met with patient and his cousin during H&N [facility].  Following clinic, showed them SIM and Tomo, explained SIM, Tomo arrival and treatment procedures.  They verbalized understanding.  Continuing navigation as L1 patient (new patient).  Rick Diehl, RN, BSN, CHPN Head & Neck Oncology Navigator [phone]  

## 2013-12-17 ENCOUNTER — Other Ambulatory Visit: Payer: Self-pay | Admitting: Radiation Oncology

## 2013-12-17 ENCOUNTER — Telehealth: Payer: Self-pay | Admitting: *Deleted

## 2013-12-17 DIAGNOSIS — C138 Malignant neoplasm of overlapping sites of hypopharynx: Secondary | ICD-10-CM

## 2013-12-17 DIAGNOSIS — C159 Malignant neoplasm of esophagus, unspecified: Secondary | ICD-10-CM

## 2013-12-17 NOTE — Telephone Encounter (Signed)
Called patient to inform of swallowing study on 12-21-13 - arrival time - 1:45 pm , and visit with surgeon on 01-05-14 - arrival time - 9:45 @ [facility] and appt. With Garald Balding on 01-10-14- arrival time - 3 pm, spoke with patient and he is aware of these appts.

## 2013-12-21 ENCOUNTER — Ambulatory Visit (HOSPITAL_COMMUNITY)
Admission: RE | Admit: 2013-12-21 | Discharge: 2013-12-21 | Disposition: A | Discharge status: Home / Self Care | Payer: Medicare Other | Source: Ambulatory Visit | Attending: Radiation Oncology | Admitting: Radiation Oncology

## 2013-12-21 ENCOUNTER — Encounter (HOSPITAL_COMMUNITY): Payer: Self-pay

## 2013-12-21 ENCOUNTER — Ambulatory Visit (HOSPITAL_COMMUNITY)
Admission: RE | Admit: 2013-12-21 | Discharge: 2013-12-21 | Disposition: A | Discharge status: Home / Self Care | Payer: Medicare Other | Source: Ambulatory Visit | Attending: Oncology | Admitting: Oncology

## 2013-12-21 DIAGNOSIS — J438 Other emphysema: Secondary | ICD-10-CM | POA: Insufficient documentation

## 2013-12-21 DIAGNOSIS — I700 Atherosclerosis of aorta: Secondary | ICD-10-CM | POA: Insufficient documentation

## 2013-12-21 DIAGNOSIS — C159 Malignant neoplasm of esophagus, unspecified: Secondary | ICD-10-CM

## 2013-12-21 DIAGNOSIS — K8020 Calculus of gallbladder without cholecystitis without obstruction: Secondary | ICD-10-CM | POA: Insufficient documentation

## 2013-12-21 DIAGNOSIS — R1310 Dysphagia, unspecified: Secondary | ICD-10-CM | POA: Insufficient documentation

## 2013-12-21 DIAGNOSIS — C138 Malignant neoplasm of overlapping sites of hypopharynx: Secondary | ICD-10-CM

## 2013-12-21 LAB — GLUCOSE, CAPILLARY: GLUCOSE-CAPILLARY: 96 mg/dL (ref 70–99)

## 2013-12-21 MED ORDER — FLUDEOXYGLUCOSE F - 18 (FDG) INJECTION
5.4000 | Freq: Once | INTRAVENOUS | Status: AC | PRN
  Administered 2013-12-21: 5.4 via INTRAVENOUS

## 2013-12-21 NOTE — Procedures (Signed)
Objective Swallowing Evaluation: Modified Barium Swallowing Study  Patient Details  Name: Michael Keith MRN: [id number] Date of Birth: December 07, 1950  Today's Date: 12/21/2013 Time: 4010-2725 SLP Time Calculation (min): 30 min  Past Medical History:  Past Medical History  Diagnosis Date  . Chronic pancreatitis     CT scan shows improving pancreatitis  . Nonischemic cardiomyopathy 09/2009    Catheterization, April, 2011, questionable occlusion of the most apical portion of the LAD, LV dysfunction out of proportion to coronary disease  . Chronic systolic CHF (congestive heart failure)     April, 2011 mild troponin elevation at that time  . Mitral regurgitation     mild, echo April 2011  . Tobacco abuse   . Alcohol use     heavy until Jan 2011, patient was counseled concerning alcohol in April 2011  . COPD (chronic obstructive pulmonary disease)   . Fluid overload 03/2010    October, 2011  . Cough Jan 2012    may be from enalapril, January, 2012  . Ejection fraction < 50%     EF 35-40%, echo, April, 2011  . CAD (coronary artery disease)     Catheterization, April, 2011, questionable occlusion of the most apical portion of the LAD, LV dysfunction out of proportion to coronary disease  . Squamous cell esophageal cancer    Past Surgical History:  Past Surgical History  Procedure Laterality Date  . Cardiac catheterization  April 2011    questionable occlusion of the most apical portion of the LAD / LV dysfunction out of proportion to coronary disease  . Wrist surgey      left wrist   HPI:  63 yo male referred by Dr Isidore Moos for MBS due to pt having aspiration/penetration on recent barium swallow.  Pt found to have mucosal irregularity and luminal narrowing at the cervicothoracic junction most consistent with esophageal carcinoma.  On 12/03/2013 pt underwent endoscopy by Dr Michail Sermon and was found tohave a large fungaging ulcerative mass in the hypopharynx partially obstructing airway -  appeared malignant.  Also partially obstructing malignant esophageal tumor at the cricopharyngeus in the upper 1/3 of esophagus per MD notes.  Plan is for pt to have radiation/chemotherapy per MD.  Pt has lost 20 pounds over 8 weeks and has solid food dysphagia.  Pt PMH also + for cough, CAD, tobacco abuse, alcohol abuse, CHF, chronic pancreatitis, CHF, nonischemic cardiomyopathy, mitral regurgitation.  Plan is for pt to receive PEG tube in July and to see dentist this week per pt. .       Assessment / Plan / Recommendation Clinical Impression  Dysphagia Diagnosis: Suspected primary esophageal dysphagia;Moderate cervical esophageal phase dysphagia Clinical impression: Pt with functional oropharyngeal swallow ability with adequate oral transiting, laryngeal elevation/muscular contraction at this time.  There was not aspiration/frank penetration of any consistency tested nor significant pharyngeal residuals.  Suspect pt's aspiration risk primarily due to known narrowing in proximal esophagus (near UES) resulting in stasis that may backflow into pharynx (but this was not observed during today's test).  Per barium swallow/esophagram 12/01/13, "pt with have mucosal irregularity and luminal narrowing at the cervicothoracic junction most consistent with esophageal carcinoma".  Narrowing appeared to result in decreased clearance of barium through this region (especially with increased viscocity) with pt benefiting from reflexive double swallows with each bolus and following solids with liquids. SLP advised pt to masticate food until it's mush and blenderize food if needed.  Follow every bite of solid with liquids, conduct dry swallows with  every bite/sip.  Of note, pt did not sense when appeared with mild amount of residuals near CP region but senses larger amounts.  Highly advise pt to seek MD/pharmacy input for modifying medications to liquids, crushable or chewable form.  Using teach back, verbal and visual cues with  live video, SlP provided feedback to reinforce effective strategies.  Recommend follow up with speech pathologist to implement swallow exercises prior to intiation of radiation as indicated.  Thanks for this consult.    Treatment Recommendation  Defer treatment plan to SLP at (Comment) (outpt slp)    Diet Recommendation Thin liquid;Dysphagia 3 (Mechanical Soft) (masticate food to mush)   Liquid Administration via: Cup;Straw Medication Administration:  (CRUSH or LIQUIDS or CHEWABLE) Supervision: Patient able to self feed Compensations: Slow rate;Small sips/bites;Multiple dry swallows after each bite/sip;Follow solids with liquid Postural Changes and/or Swallow Maneuvers: Seated upright 90 degrees;Upright 30-60 min after meal    Other  Recommendations   Follow up with outpt SLP       General Date of Onset: 12/21/13 HPI: 63 yo male referred by Dr Isidore Moos for MBS due to pt having aspiration/penetration on recent barium swallow.  Pt found to have mucosal irregularity and luminal narrowing at the cervicothoracic junction most consistent with esophageal carcinoma.  On 12/03/2013 pt underwent endoscopy by Dr Michail Sermon and was found tohave a large fungaging ulcerative mass in the hypopharynx partially obstructing airway - appeared malignant.  Also partially obstructing malignant esophageal tumor at the cricopharyngeus in the upper 1/3 of esophagus per MD notes.  Plan is for pt to have radiation/chemotherapy per MD.  Pt has lost 20 pounds over 8 weeks and has solid food dysphagia.  Pt PMH also + for cough, CAD, tobacco abuse, alcohol abuse, CHF, chronic pancreatitis, CHF, nonischemic cardiomyopathy, mitral regurgitation.  Plan is for pt to receive PEG tube in July and to see dentist this week per pt. .   Type of Study: Modified Barium Swallowing Study Reason for Referral: Objectively evaluate swallowing function Diet Prior to this Study: Dysphagia 3 (soft);Thin liquids Respiratory Status: Room  air Behavior/Cognition: Alert;Cooperative;Pleasant mood Oral Cavity - Dentition: Missing dentition;Poor condition (poor dentition, few present only) Oral Motor / Sensory Function: Within functional limits Self-Feeding Abilities: Able to feed self Patient Positioning: Upright in bed Baseline Vocal Quality: Clear Volitional Cough: Strong Volitional Swallow: Able to elicit Anatomy:  (see HHX) Pharyngeal Secretions: Not observed secondary MBS    Reason for Referral Objectively evaluate swallowing function   Oral Phase Oral Preparation/Oral Phase Oral Phase: WFL Oral - Nectar Oral - Nectar Cup: Within functional limits Oral - Thin Oral - Thin Cup: Within functional limits Oral - Thin Straw: Within functional limits Oral - Solids Oral - Puree: Within functional limits Oral - Mechanical Soft: Within functional limits   Pharyngeal Phase Pharyngeal - Nectar Pharyngeal - Nectar Cup: Within functional limits;Pharyngeal residue - cp segment Pharyngeal - Thin Pharyngeal - Thin Cup: Within functional limits;Pharyngeal residue - cp segment Pharyngeal - Thin Straw: Within functional limits;Pharyngeal residue - cp segment Pharyngeal - Solids Pharyngeal - Puree: Within functional limits;Pharyngeal residue - cp segment Pharyngeal - Mechanical Soft: Within functional limits;Pharyngeal residue - cp segment  Cervical Esophageal Phase    GO    Cervical Esophageal Phase Cervical Esophageal Phase: Impaired Cervical Esophageal Phase - Nectar Nectar Cup: Prominent cricopharyngeal segment Cervical Esophageal Phase - Thin Thin Cup: Prominent cricopharyngeal segment Thin Straw: Prominent cricopharyngeal segment Cervical Esophageal Phase - Solids Puree: Prominent cricopharyngeal segment Mechanical Soft: Prominent cricopharyngeal  segment Cervical Esophageal Phase - Comment Cervical Esophageal Comment: Per barium swallow completed 12/01/13, pt with have mucosal irregularity and luminal narrowing at  the cervicothoracic junction most consistent with esophageal carcinoma- decreased clearance of barium through this region with pt benefiting from reflexive double swallows with each bolus and following solids with liquids.     Functional Assessment Tool Used: mbs, clinical judgement Functional Limitations: Swallowing Swallow Current Status ([id number]): At least 20 percent but less than 40 percent impaired, limited or restricted Swallow Goal Status [phone]): At least 20 percent but less than 40 percent impaired, limited or restricted Swallow Discharge Status [phone]): At least 20 percent but less than 40 percent impaired, limited or restricted    Claudie Fisherman, [facility] SLP [phone]

## 2013-12-23 ENCOUNTER — Ambulatory Visit (HOSPITAL_COMMUNITY): Payer: Self-pay | Admitting: Dentistry

## 2013-12-23 ENCOUNTER — Encounter: Payer: Self-pay | Admitting: *Deleted

## 2013-12-23 ENCOUNTER — Encounter (HOSPITAL_COMMUNITY): Payer: Self-pay | Admitting: Dentistry

## 2013-12-23 VITALS — BP 96/69 | HR 63 | Temp 97.6°F

## 2013-12-23 DIAGNOSIS — Z0189 Encounter for other specified special examinations: Secondary | ICD-10-CM

## 2013-12-23 DIAGNOSIS — K08409 Partial loss of teeth, unspecified cause, unspecified class: Secondary | ICD-10-CM

## 2013-12-23 DIAGNOSIS — K045 Chronic apical periodontitis: Secondary | ICD-10-CM

## 2013-12-23 DIAGNOSIS — Z972 Presence of dental prosthetic device (complete) (partial): Secondary | ICD-10-CM

## 2013-12-23 DIAGNOSIS — K0530 Chronic periodontitis, unspecified: Secondary | ICD-10-CM

## 2013-12-23 DIAGNOSIS — M264 Malocclusion, unspecified: Secondary | ICD-10-CM

## 2013-12-23 DIAGNOSIS — K0889 Other specified disorders of teeth and supporting structures: Secondary | ICD-10-CM

## 2013-12-23 DIAGNOSIS — C153 Malignant neoplasm of upper third of esophagus: Secondary | ICD-10-CM

## 2013-12-23 DIAGNOSIS — K036 Deposits [accretions] on teeth: Secondary | ICD-10-CM

## 2013-12-23 DIAGNOSIS — K0820 Unspecified atrophy of edentulous alveolar ridge: Secondary | ICD-10-CM

## 2013-12-23 DIAGNOSIS — K029 Dental caries, unspecified: Secondary | ICD-10-CM

## 2013-12-23 NOTE — Progress Notes (Signed)
Completed and faxed SCAT application for transportation assistance.  Polo Riley, MSW, LCSW, OSW-C Clinical Social Worker [facility] [phone]

## 2013-12-23 NOTE — Patient Instructions (Signed)

## 2013-12-23 NOTE — Progress Notes (Signed)
DENTAL CONSULTATION  Date of Consultation:  12/23/2013 Patient Name:   Michael Keith Date of Birth:   [birth year] Medical Record Number: [id number]  VITALS: BP 96/69  Pulse 63  Temp(Src) 97.6 F (36.4 C) (Oral)   CHIEF COMPLAINT: The patient was referred for a pre-chemoradiation therapy dental protocol examination.  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 Patient with anticipated chemoradiation therapy. Patient is now seen as part of a medically necessary pre-chemoradiation therapy dental protocol examination.  The patient currently denies acute toothache, swellings, or abscesses. Patient was last seen by an oral surgeon, Dr. Tessie Keith, in 2012 to have 3 teeth pulled. Patient denies having any dental complications from those dental extractions. Patient denies having regular dental care. Patient had an upper complete denture fabricated in 2001 by [employer].  Patient indicates that he never wore the upper complete denture due to problems with gagging. Patient denies having a lower partial denture.  The patient denies having a regular primary dentist.  PROBLEM LIST: Patient Active Problem List   Diagnosis Date Noted  . Cancer of upper third of esophagus 12/15/2013    Priority: High  . Squamous cell esophageal cancer 12/09/2013    Priority: High  . Malignant neoplasm overlapping hypopharynx site 12/15/2013  . Dysphasia 09/20/2013  . Pulmonary hypertension 08/16/2013  . Chronic systolic CHF (congestive heart failure)   . Chronic pancreatitis   . Mitral regurgitation   . Tobacco abuse   . Alcohol use   . COPD (chronic obstructive pulmonary disease)   . Ejection fraction < 50%   . CAD (coronary artery disease)   . Cough 07/01/2010  . Fluid overload 03/31/2010  . Nonischemic cardiomyopathy 09/29/2009    PMH: Past Medical History  Diagnosis Date  . Chronic pancreatitis     CT scan  shows improving pancreatitis  . Nonischemic cardiomyopathy 09/2009    Catheterization, April, 2011, questionable occlusion of the most apical portion of the LAD, LV dysfunction out of proportion to coronary disease  . Chronic systolic CHF (congestive heart failure)     April, 2011 mild troponin elevation at that time  . Mitral regurgitation     mild, echo April 2011  . Tobacco abuse   . Alcohol use     heavy until Jan 2011, patient was counseled concerning alcohol in April 2011  . COPD (chronic obstructive pulmonary disease)   . Fluid overload 03/2010    October, 2011  . Cough Jan 2012    may be from enalapril, January, 2012  . Ejection fraction < 50%     EF 35-40%, echo, April, 2011  . CAD (coronary artery disease)     Catheterization, April, 2011, questionable occlusion of the most apical portion of the LAD, LV dysfunction out of proportion to coronary disease  . Squamous cell esophageal cancer     PSH: Past Surgical History  Procedure Laterality Date  . Cardiac catheterization  April 2011    questionable occlusion of the most apical portion of the LAD / LV dysfunction out of proportion to coronary disease  . Wrist surgey      left wrist    ALLERGIES: No Known Allergies  MEDICATIONS: Current Outpatient Prescriptions  Medication Sig Dispense Refill  . aspirin 81 MG tablet Take 81 mg by mouth daily.        . carvedilol (COREG) 12.5 MG tablet take 1 tablet by mouth twice a day  60  tablet  6  . enalapril (VASOTEC) 5 MG tablet take 1 tablet by mouth twice a day  180 tablet  1  . furosemide (LASIX) 40 MG tablet Take 1 tablet (40 mg total) by mouth daily.  30 tablet  3  . magnesium oxide (MAG-OX) 400 (241.3 MG) MG tablet take 1 tablet by mouth twice a day  60 tablet  3  . Thiamine HCl (VITAMIN B-1) 100 MG tablet Take 100 mg by mouth daily.        . multivitamin (THERAGRAN) per tablet Take 1 tablet by mouth daily.        . nitroGLYCERIN (NITROSTAT) 0.4 MG SL tablet Place 0.4 mg  under the tongue every 5 (five) minutes as needed.         No current facility-administered medications for this visit.    LABS: Lab Results  Component Value Date   WBC 3.5* 12/09/2013   HGB 11.9* 12/09/2013   HCT 35.5* 12/09/2013   MCV 87.7 12/09/2013   PLT 127 Large platelets present* 12/09/2013      Component Value Date/Time   NA 140 12/09/2013 1333   NA 135 09/20/2013 1441   K 4.7 12/09/2013 1333   K 4.0 09/20/2013 1441   CL 99 09/20/2013 1441   CO2 25 12/09/2013 1333   CO2 28 09/20/2013 1441   GLUCOSE 113 12/09/2013 1333   GLUCOSE 85 09/20/2013 1441   BUN 26.8* 12/09/2013 1333   BUN 10 09/20/2013 1441   CREATININE 2.2* 12/09/2013 1333   CREATININE 1.6* 09/20/2013 1441   CALCIUM 9.6 12/09/2013 1333   CALCIUM 9.7 09/20/2013 1441   GFRNONAA >60 05/02/2010 0505   GFRAA  Value: >60        The eGFR has been calculated using the MDRD equation. This calculation has not been validated in all clinical situations. eGFR's persistently <60 mL/min signify possible Chronic Kidney Disease. 05/02/2010 0505   Lab Results  Component Value Date   INR 1.37 04/25/2010   INR 1.18 10/24/2009   INR 1.08 09/30/2009   No results found for this basename: PTT    SOCIAL HISTORY: History   Social History  . Marital Status: Single    Spouse Name: N/A    Number of Children: N/A  . Years of Education: N/A   Occupational History  . Not on file.   Social History Main Topics  . Smoking status: Current Every Day Smoker -- 0.50 packs/day for 30 years    Types: Cigarettes  . Smokeless tobacco: Never Used  . Alcohol Use: Yes     Comment: heavy untill Jan 2011  . Drug Use: No  . Sexual Activity: Not on file   Other Topics Concern  . Not on file   Social History Narrative  . No narrative on file    FAMILY HISTORY: Family History  Problem Relation Age of Onset  . Diabetes Cousin   . Cancer Mother      REVIEW OF SYSTEMS: Reviewed with the patient and positive as above.   DENTAL HISTORY: CHIEF  COMPLAINT: The patient was referred for a pre-chemoradiation therapy dental protocol examination.  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 Patient with anticipated chemoradiation therapy. Patient is now seen as part of a medically necessary pre-chemoradiation therapy dental protocol examination.  The patient currently denies acute toothache, swellings, or abscesses. Patient was last seen by an oral surgeon, Dr. Tessie Keith, in 2012 to  have 3 teeth pulled. Patient denies having any dental complications from those dental extractions. Patient denies having regular dental care. Patient had an upper complete denture fabricated in 2001 by [employer].  Patient indicates that he never wore the upper complete denture due to problems with gagging. Patient denies having a lower partial denture.  The patient denies having a regular primary dentist.  DENTAL EXAMINATION: GENERAL: Patient is a well-developed, slightly built male in no acute distress. HEAD AND NECK: There is no obvious submandibular lymphadenopathy. The patient denies acute TMJ symptoms. INTRAORAL EXAM: Patient has normal saliva. There is no evidence of oral abscess formation. There is atrophy of the edentulous mandibular alveolar ridges. DENTITION: Patient is missing all teeth with the exception of tooth numbers 20, 22, 23, and 28. PERIODONTAL: Patient has chronic periodontitis with plaque and calculus accumulations, generalized interval recession and tooth mobility as charted. DENTAL CARIES/SUBOPTIMAL RESTORATIONS: Patient has multiple dental caries affecting all remaining lower teeth. ENDODONTIC: Patient currently denies acute pulpitis symptoms. There does appear to be incipient periapical periapical radiolucency associated with apices of tooth numbers 20 and 28. CROWN AND BRIDGE: There are no crown or bridge restorations. PROSTHODONTIC: Patient  indicates that he had an upper complete denture fabricated in 2001 by [facility].  Patient has never worn the upper complete denture due to problems with gagging.  Patient denies having any lower partial denture. The patient did not bring the denture with him today. OCCLUSION: Patient has a poor occlusal scheme secondary to multiple missing teeth, ill fitting maxillary complete denture, and lack of replacement of all missing teeth with clinically acceptable dental prostheses. RADIOGRAPHIC INTERPRETATION: An orthopantogram was taken and supplemented with 2 periapical radiographs. There are multiple missing teeth with the exception of tooth numbers 20, 22,23 and 28. There are multiple dental caries noted. There is incipient periapical radiolucency associated with tooth numbers 20 and 28. There is moderate bone loss noted. There is atrophy of the edentulous alveolar ridges.  ASSESSMENTS: 1. Squamous cell carcinoma of the esophagus and hypopharynx 2. Pre-chemoradiation therapy dental protocol 3. Chronic apical periodontitis 4. Rampant dental caries 5. Chronic periodontitis of bone loss 6. Gingival recession 7. Accretions 8. Tooth mobility 9. Multiple missing teeth 10. Ill-fitting maxillary complete denture 11. Supra-eruption and drifting of the unopposed teeth into the edentulous areas 12. Malocclusion 13. Cardiovascular compromise with potential cardiovascular complications with anticipated invasive dental procedures   PLAN/RECOMMENDATIONS: 1. I discussed the risks, benefits, and complications of various treatment options with the patient in relationship to his medical and dental conditions, anticipated chemoradiation therapy, and chemoradiation therapy side effects to include xerostomia, radiation caries, trismus, mucositis, taste changes, gum and jawbone changes, and risk for infection, bleeding, and osteoradionecrosis. We discussed various treatment options to include no treatment,  multiple extractions with alveoloplasty, pre-prosthetic surgery as indicated, periodontal therapy, dental restorations, root canal therapy, crown and bridge therapy, implant therapy, and replacement of missing teeth as indicated. The patient currently wishes to proceed with referral to Dr. Tessie Keith for extraction of remaining teeth with alveoloplasty.  The patient will then follow up with the dentist of his choice for fabrication of upper and lower complete dentures as indicated 3 months after the last radiation therapy has been provided. Patient should be ready to start chemoradiation therapy 2 weeks after the dental extractions occur barring any unforeseen complications.  Initial chemotherapy with Dr. Alen Blew as scheduled, may need to be adjusted to allow for adequate healing from the dental extractions.  2. Discussion of  findings with medical team and coordination of future medical and dental care as needed.  I spent 75 minutes face to face with patient and more than 50% of time was spent in counseling and /or coordination of care.   Lenn Cal, DDS

## 2013-12-29 ENCOUNTER — Telehealth: Payer: Self-pay | Admitting: *Deleted

## 2013-12-29 NOTE — Telephone Encounter (Signed)
Called patient to inform of effort to schedule an appt with [facility] for further diagnostic evaluation.  He verbalized understanding for referral and its purpose.   He indicated he had been contacted by [facility] for 9:00 appt tomorrow morning but he explained he did not have transportation.   He indicated Polo Riley, LCSW, has facilitated his application for [facility] services.  He further explained he prefers appts for after 12:00 noon to facilitate transportation whether provided by [facility] or his cousin.  I will document this for Scheduling.  He asked questions about the need for extractions, I provided explanation, he verbalized understanding.  He confirmed he had my contact information should he have questions/needs in the future.  Continuing navigation as L1 patient (new patient).  [name], RN, BSN, [facility] Head & Neck Oncology Navigator [phone]

## 2013-12-30 ENCOUNTER — Telehealth: Payer: Self-pay | Admitting: *Deleted

## 2013-12-30 NOTE — Telephone Encounter (Addendum)
Spoke with patient this morning after conversation with Gerald Stabs at [facility] who indicated Dr. Constance Holster can see him next Tuesday, 7/7, @ 1:00 (arrival time 12:30).  Patient agreed to appt, verbalized understanding of arrival time and bringing of his Medicare card; I notified Gerald Stabs of his acceptance.  I reviewed additional upcoming appts as noted in Epic:  7/8 @ 10:00 am with Dr. Grandville Silos for evaluation of PEG tube placement, 7/8 @ 11:00 am CT SIM, 7/13 @ 3:30 pm Speech, 7/15 @ 3:15 with Dr. Alen Blew.  Patient indicated he had dental extractions scheduled with Dr. Terence Lux on 7/13 @ 2:45.  I notified Angie of need to cancel, to be reschedule at later date.  Patient wrote dates/times of appts, verbalized understanding; in addition, I mailed today an Epic appt calendar with all appts noted.    Colleague Mont Dutton has agreed to call patient Tuesday morning next week to review appts.  Per Dr. Hazeline Junker guidance, I informed/explained results of 12/21/13 PET.  Patient expressed appreciation for information.  Continuing navigation as L1 patient (new patient).  [name], RN, BSN, [facility] Head & Neck Oncology Navigator [phone]

## 2014-01-03 ENCOUNTER — Telehealth: Payer: Self-pay | Admitting: Oncology

## 2014-01-03 ENCOUNTER — Ambulatory Visit (INDEPENDENT_AMBULATORY_CARE_PROVIDER_SITE_OTHER): Payer: Medicare Other | Admitting: General Surgery

## 2014-01-03 NOTE — Telephone Encounter (Signed)
MEDICAL RECORDS FAXED TO [facility] [phone]

## 2014-01-05 ENCOUNTER — Telehealth: Payer: Self-pay | Admitting: *Deleted

## 2014-01-05 ENCOUNTER — Ambulatory Visit
Admission: RE | Admit: 2014-01-05 | Discharge: 2014-01-05 | Disposition: A | Discharge status: Home / Self Care | Payer: Medicare Other | Source: Ambulatory Visit | Attending: Radiation Oncology | Admitting: Radiation Oncology

## 2014-01-05 ENCOUNTER — Ambulatory Visit (INDEPENDENT_AMBULATORY_CARE_PROVIDER_SITE_OTHER): Payer: Medicare Other | Admitting: General Surgery

## 2014-01-05 ENCOUNTER — Encounter: Payer: Self-pay | Admitting: *Deleted

## 2014-01-05 ENCOUNTER — Other Ambulatory Visit: Payer: Self-pay | Admitting: Oncology

## 2014-01-05 DIAGNOSIS — R1310 Dysphagia, unspecified: Secondary | ICD-10-CM | POA: Diagnosis not present

## 2014-01-05 DIAGNOSIS — Z681 Body mass index (BMI) 19 or less, adult: Secondary | ICD-10-CM | POA: Diagnosis not present

## 2014-01-05 DIAGNOSIS — R000 Tachycardia, unspecified: Secondary | ICD-10-CM | POA: Insufficient documentation

## 2014-01-05 DIAGNOSIS — C153 Malignant neoplasm of upper third of esophagus: Secondary | ICD-10-CM

## 2014-01-05 DIAGNOSIS — E860 Dehydration: Secondary | ICD-10-CM | POA: Insufficient documentation

## 2014-01-05 DIAGNOSIS — Z510 Encounter for antineoplastic radiation therapy: Secondary | ICD-10-CM | POA: Diagnosis not present

## 2014-01-05 DIAGNOSIS — R627 Adult failure to thrive: Secondary | ICD-10-CM | POA: Diagnosis not present

## 2014-01-05 DIAGNOSIS — Z931 Gastrostomy status: Secondary | ICD-10-CM | POA: Insufficient documentation

## 2014-01-05 DIAGNOSIS — R0989 Other specified symptoms and signs involving the circulatory and respiratory systems: Secondary | ICD-10-CM | POA: Insufficient documentation

## 2014-01-05 DIAGNOSIS — C138 Malignant neoplasm of overlapping sites of hypopharynx: Secondary | ICD-10-CM

## 2014-01-05 NOTE — Telephone Encounter (Signed)
Called patient to asked about missing today's 10:00 appt with Dr. Grandville Silos for PEG evaluation and 11:00 SIM.  Per Eliezer Lofts, offered pt a 3:00 SIM appt today which he agreed to.  Later called [facility] to reschedule consult.  Earliest available appt which accomodates patient's requirements of post-noon is 01/21/14 at 2:30.  Patient agreed to this appt time and understands that I am trying to get an appt prior to starting RT on 01/17/14.  Continuing navigation as L1 patient (new patient).  [name], RN, BSN, [facility] Head & Neck Oncology Navigator [phone]

## 2014-01-05 NOTE — Progress Notes (Signed)
To provide support, encouragement and care continuity, met with patient during SIM.  Provided another tour of [place], again explained arrival procedure.  He verbalized understanding.  Provided EPIC calendar for upcoming RT and other appts to patient and his cousin who will frequently be providing transport.  [name], RN, BSN, [facility] Head & Neck Oncology Navigator [phone]

## 2014-01-05 NOTE — Progress Notes (Signed)
Simulation, IMRT treatment planning, and Special treatment procedure note   outpatient  Diagnosis:    ICD-9-CM  1. Cancer of upper third of esophagus 150.3    The patient was taken to the CT simulator and laid in the supine position on the table. An Aquaplast head and shoulder mask was custom fitted to the patient's anatomy. High-resolution CT axial imaging was obtained of the head and neck with contrast. I verified that the quality of the imaging is good for treatment planning. 1 Medically Necessary Treatment Device was fabricated and supervised by me: Aquaplast mask.   Treatment planning note I plan to treat the patient with helical Tomotherapy, IMRT. I plan to treat the patient's tumor and bilateral lower neck and upper medistinal nodes. I plan to treat to a total dose of 60 Gray in 30 fractions to the tumor, and 48Gy in 30 fractions to the nodal sites.   IMRT planning Note  IMRT is an important modality to deliver adequate dose to the patient's at risk tissues while sparing the patient's normal structures, including the: esophagus, parotid tissue, mandible, brain stem, spinal cord, oral cavity, brachial plexus.  This justifies the use of IMRT in the patient's treatment.   Special Treatment Procedure Note:  The patient will be receiving chemotherapy concurrently. Chemotherapy heightens the risk of side effects. I have considered this during the patient's treatment planning process and will monitor the patient accordingly for side effects on a weekly basis. Concurrent chemotherapy increases the complexity of this patient's treatment and therefore this constitutes a special treatment procedure.  Note: Dr Constance Holster performed a laryngoscopy last week. No sign of laryngeal or hypopharyngeal mass. The only known tumor is in the upper esophagus. -----------------------------------  Michael Gibson, MD

## 2014-01-06 ENCOUNTER — Telehealth: Payer: Self-pay | Admitting: Oncology

## 2014-01-06 ENCOUNTER — Telehealth: Payer: Self-pay | Admitting: *Deleted

## 2014-01-06 NOTE — Telephone Encounter (Signed)
Per staff message and POF I have scheduled appts. Advised scheduler of appts. Advised scheduler to move lab appts JMW

## 2014-01-06 NOTE — Telephone Encounter (Signed)
lmonvm advising the pt to put up a new revised July appt calendar.

## 2014-01-10 ENCOUNTER — Ambulatory Visit: Payer: Medicare Other

## 2014-01-12 ENCOUNTER — Telehealth: Payer: Self-pay | Admitting: Medical Oncology

## 2014-01-12 ENCOUNTER — Telehealth: Payer: Self-pay | Admitting: Oncology

## 2014-01-12 ENCOUNTER — Other Ambulatory Visit (HOSPITAL_BASED_OUTPATIENT_CLINIC_OR_DEPARTMENT_OTHER): Payer: Medicare Other

## 2014-01-12 ENCOUNTER — Encounter: Payer: Self-pay | Admitting: Oncology

## 2014-01-12 ENCOUNTER — Ambulatory Visit (HOSPITAL_BASED_OUTPATIENT_CLINIC_OR_DEPARTMENT_OTHER): Payer: Medicare Other | Admitting: Oncology

## 2014-01-12 VITALS — BP 101/65 | HR 68 | Temp 97.3°F | Resp 18 | Ht 66.0 in | Wt 106.9 lb

## 2014-01-12 DIAGNOSIS — Z510 Encounter for antineoplastic radiation therapy: Secondary | ICD-10-CM | POA: Diagnosis not present

## 2014-01-12 DIAGNOSIS — N289 Disorder of kidney and ureter, unspecified: Secondary | ICD-10-CM

## 2014-01-12 DIAGNOSIS — C153 Malignant neoplasm of upper third of esophagus: Secondary | ICD-10-CM

## 2014-01-12 DIAGNOSIS — C138 Malignant neoplasm of overlapping sites of hypopharynx: Secondary | ICD-10-CM

## 2014-01-12 LAB — COMPREHENSIVE METABOLIC PANEL (CC13)
ALBUMIN: 3.9 g/dL (ref 3.5–5.0)
ALT: 11 U/L (ref 0–55)
ANION GAP: 9 meq/L (ref 3–11)
AST: 23 U/L (ref 5–34)
Alkaline Phosphatase: 69 U/L (ref 40–150)
BILIRUBIN TOTAL: 0.42 mg/dL (ref 0.20–1.20)
BUN: 69.2 mg/dL — ABNORMAL HIGH (ref 7.0–26.0)
CO2: 19 meq/L — AB (ref 22–29)
Calcium: 9.7 mg/dL (ref 8.4–10.4)
Chloride: 104 mEq/L (ref 98–109)
Creatinine: 2.6 mg/dL — ABNORMAL HIGH (ref 0.7–1.3)
Glucose: 101 mg/dl (ref 70–140)
Potassium: 5.9 mEq/L — ABNORMAL HIGH (ref 3.5–5.1)
SODIUM: 132 meq/L — AB (ref 136–145)
Total Protein: 8.4 g/dL — ABNORMAL HIGH (ref 6.4–8.3)

## 2014-01-12 LAB — CBC WITH DIFFERENTIAL/PLATELET
BASO%: 0.7 % (ref 0.0–2.0)
Basophils Absolute: 0 10*3/uL (ref 0.0–0.1)
EOS%: 7.2 % — ABNORMAL HIGH (ref 0.0–7.0)
Eosinophils Absolute: 0.3 10*3/uL (ref 0.0–0.5)
HCT: 35.4 % — ABNORMAL LOW (ref 38.4–49.9)
HGB: 11.4 g/dL — ABNORMAL LOW (ref 13.0–17.1)
LYMPH%: 29.2 % (ref 14.0–49.0)
MCH: 28.7 pg (ref 27.2–33.4)
MCHC: 32.1 g/dL (ref 32.0–36.0)
MCV: 89.4 fL (ref 79.3–98.0)
MONO#: 0.6 10*3/uL (ref 0.1–0.9)
MONO%: 14.5 % — ABNORMAL HIGH (ref 0.0–14.0)
NEUT%: 48.4 % (ref 39.0–75.0)
NEUTROS ABS: 2 10*3/uL (ref 1.5–6.5)
PLATELETS: 151 10*3/uL (ref 140–400)
RBC: 3.96 10*6/uL — AB (ref 4.20–5.82)
RDW: 13.4 % (ref 11.0–14.6)
WBC: 4.1 10*3/uL (ref 4.0–10.3)
lymph#: 1.2 10*3/uL (ref 0.9–3.3)

## 2014-01-12 MED ORDER — PROCHLORPERAZINE MALEATE 10 MG PO TABS: 10.0000 mg | ORAL_TABLET | Freq: Four times a day (QID) | ORAL | Status: DC | PRN

## 2014-01-12 NOTE — Telephone Encounter (Signed)
Message copied by Maxwell Marion on Wed Jan 12, 2014  4:39 PM ------      Message from: Wyatt Portela      Created: Wed Jan 12, 2014  4:25 PM       Rubin Payor;             Could you please call the patient and let him know to stop the Vasotec. It's causing hyperkalemia.       We also need to set him up with IVF next week. With the start of radiation. I sent a POF to add IVF on 7/20, 7/22, 7/23 and 7/24.       He will need NS one L daily.             Rick:            He is starting chemo on 7/21 and we need to coordinate his port with his PEG to spare him the extra surgery.             Thanks.             FS ------

## 2014-01-12 NOTE — Progress Notes (Signed)
Hematology and Oncology Follow Up Visit  Michael Keith [id number] 1951/04/14 63 y.o. 01/12/2014 4:10 PM   Principle Diagnosis: 63 year old gentleman with squamous cell carcinoma of the cervical esophagus diagnosed June of 2015. He presented with a 20 pound weight loss and found to have an upper esophageal tumor that is biopsy proven.   Current therapy: Under evaluation for the start of chemotherapy with radiation as a definitive modality.  Interim History:  Michael Keith presents today for a followup visit. He is a gentleman I saw on 12/09/2013 for the evaluation of squamous cell carcinoma of the esophagus. Since his last visit, he underwent a staging PET/CT scan which showed tumor in the upper esophagus without any evidence of local lymphadenopathy. He also obtain dental clearance and ready to proceed with radiation therapy on 01/17/2014. He continues to have issues with swallowing but able to keep liquids down and keep his nutrition as best as he can. He is scheduled to have a PEG tube placed in the near future. Otherwise he has no other complaints. He still able to ambulate without any difficulties. Had not had any syncope or change in his performance status.  Medications: I have reviewed the patient's current medications.  Current Outpatient Prescriptions  Medication Sig Dispense Refill  . aspirin 81 MG tablet Take 81 mg by mouth daily.        . carvedilol (COREG) 12.5 MG tablet take 1 tablet by mouth twice a day  60 tablet  6  . enalapril (VASOTEC) 5 MG tablet take 1 tablet by mouth twice a day  180 tablet  1  . furosemide (LASIX) 40 MG tablet Take 1 tablet (40 mg total) by mouth daily.  30 tablet  3  . magnesium oxide (MAG-OX) 400 (241.3 MG) MG tablet take 1 tablet by mouth twice a day  60 tablet  3  . multivitamin (THERAGRAN) per tablet Take 1 tablet by mouth daily.        . nitroGLYCERIN (NITROSTAT) 0.4 MG SL tablet Place 0.4 mg under the tongue every 5 (five) minutes as needed.        .  Thiamine HCl (VITAMIN B-1) 100 MG tablet Take 100 mg by mouth daily.        . prochlorperazine (COMPAZINE) 10 MG tablet Take 1 tablet (10 mg total) by mouth every 6 (six) hours as needed for nausea or vomiting.  30 tablet  0   No current facility-administered medications for this visit.     Allergies: No Known Allergies  Past Medical History, Surgical history, Social history, and Family History were reviewed and updated.  Review of Systems: Constitutional:  Negative for fever, chills, night sweats, anorexia, weight loss, pain. Cardiovascular: no chest pain or dyspnea on exertion Respiratory: no cough, shortness of breath, or wheezing Neurological: no TIA or stroke symptoms Dermatological: negative ENT: negative Skin: Negative. Gastrointestinal: no abdominal pain, change in bowel habits, or black or bloody stools Genito-Urinary: no dysuria, trouble voiding, or hematuria Hematological and Lymphatic: negative Breast: negative Musculoskeletal: negative Remaining ROS negative. Physical Exam: Blood pressure 101/65, pulse 68, temperature 97.3 F (36.3 C), temperature source Oral, resp. rate 18, height 5\' 6"  (1.676 m), weight 106 lb 14.4 oz (48.49 kg). ECOG: 1 General appearance: alert and cooperative Head: Normocephalic, without obvious abnormality Neck: no adenopathy Lymph nodes: Cervical, supraclavicular, and axillary nodes normal. Heart:regular rate and rhythm, S1, S2 normal, no murmur, click, rub or gallop Lung:chest clear, no wheezing, rales, normal symmetric air entry Abdomin: soft, non-tender,  without masses or organomegaly EXT:no erythema, induration, or nodules   Lab Results: Lab Results  Component Value Date   WBC 4.1 01/12/2014   HGB 11.4* 01/12/2014   HCT 35.4* 01/12/2014   MCV 89.4 01/12/2014   PLT 151 01/12/2014     Chemistry      Component Value Date/Time   NA 132* 01/12/2014 1424   NA 135 09/20/2013 1441   K 5.9* 01/12/2014 1424   K 4.0 09/20/2013 1441   CL 99  09/20/2013 1441   CO2 19* 01/12/2014 1424   CO2 28 09/20/2013 1441   BUN 69.2* 01/12/2014 1424   BUN 10 09/20/2013 1441   CREATININE 2.6* 01/12/2014 1424   CREATININE 1.6* 09/20/2013 1441      Component Value Date/Time   CALCIUM 9.7 01/12/2014 1424   CALCIUM 9.7 09/20/2013 1441   ALKPHOS 69 01/12/2014 1424   ALKPHOS 164* 04/25/2010 1219   AST 23 01/12/2014 1424   AST 71* 04/25/2010 1219   ALT 11 01/12/2014 1424   ALT 95* 04/25/2010 1219   BILITOT 0.42 01/12/2014 1424   BILITOT 1.2 04/25/2010 1219         Impression and Plan:   1. 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 He presented with dysphagia with odynophagia and 20 pound weight loss. On 12/03/2013 he underwent an endoscopy and showed a large tumor that is biopsy proven to to be squamous cell carcinoma. His PET CT scan showed no evidence of metastatic disease or lymphadenopathy. He is scheduled to start radiation therapy for definitive treatment on 01/17/2014.  The logistics of administration of systemic chemotherapy concomitantly was discussed today with Michael Keith. Given his renal dysfunction, I have elected to proceed with carboplatin and taxanes combination with possibility of holding taxanes in the future if he has any problems. Complications with chemotherapy were discussed today excessively. These would include nausea, vomiting, myelosuppression, neutropenia, neutropenic sepsis as well as possible infusion-related toxicity and rarely anaphylaxis. We'll set up with chemotherapy education class he for the start of chemotherapy.  2. IV access: I discussed with him the possible need for a Port-A-Cath insertion. Complications include bleeding, thrombosis and possible infection. We will coordinate this with his PEG tube insertion.  4. Psychosocial support: He appears to have rather poor social situation at this time. Social workers involved in his case at this point.  5. Renal insufficiency: We'll continue to monitor this  closely especially with his ongoing chemotherapy.  6. Nausea prophylaxis: A prescription for Compazine was given to the patient today.      Zola Button, MD 7/15/20154:10 PM

## 2014-01-12 NOTE — Telephone Encounter (Signed)
Per MD, called patient to inform him that Dr Alen Blew wants him to stop taking his Vasotec. LVMOM with patient to stop medication and to call office, LVMOM as well with emergency contact number patient has listed for pt to return call to office.

## 2014-01-12 NOTE — Telephone Encounter (Signed)
Pt confirmed labs/ov per 07/15 POF, gave pt updated scheduled calendar and AVS ........KJ

## 2014-01-13 ENCOUNTER — Telehealth: Payer: Self-pay | Admitting: *Deleted

## 2014-01-13 ENCOUNTER — Encounter: Payer: Self-pay | Admitting: *Deleted

## 2014-01-13 ENCOUNTER — Encounter: Payer: Self-pay | Admitting: Medical Oncology

## 2014-01-13 ENCOUNTER — Other Ambulatory Visit: Payer: Medicare Other

## 2014-01-13 NOTE — Telephone Encounter (Signed)
Per POF staff phone call scheduled appts. Advised schedulers 

## 2014-01-13 NOTE — Telephone Encounter (Signed)
Called pt ca. 9:30 to remind him of 12:30 chemo ed class today, LVM.  Also left VM with cousin who is providing transportation.  [name], RN, BSN, [facility] Head & Neck Oncology Navigator [phone]

## 2014-01-13 NOTE — Telephone Encounter (Signed)
Call to patient to f/u since did not hear back from him yesterday or this morning.  Informed patient that Dr. Alen Blew wants patient to stop taking his Vasotec d/t lab results showing an elevation in potassium levels. Patient stated he understood and will not take the vasotec again unless otherwise instructed.  Patient also confirmed Chemo education appt for today @ 12:30.

## 2014-01-14 ENCOUNTER — Encounter: Payer: Self-pay | Admitting: *Deleted

## 2014-01-14 ENCOUNTER — Encounter (HOSPITAL_COMMUNITY): Payer: Self-pay | Admitting: Pharmacy Technician

## 2014-01-14 NOTE — Addendum Note (Signed)
Addended by: Wyatt Portela on: 01/14/2014 09:26 AM   Modules accepted: Orders

## 2014-01-14 NOTE — Progress Notes (Signed)
In context of yesterday's cancellation of 01/21/14 surgical consult for PEG placement (b/c Dr. Grandville Silos does not place PEGs), and reschedule on 02/02/14, called [facility], arranged for a 01/28/14 9:30 consult with Dr. Sherrin Daisy.  Per discussion with Dr. Alen Blew, arranged [facility] for Wed 01/19/14, arrival 12:30, procedure ca: 2:30 followed by recovery.  I arranged for rescheduling of RT from 2:00 to 12:00 to accommodate procedure. [facility] Short Stay confirmed they can administer the 1L NS ivf currently scheduled for administration by [facility] Infusion at 12:00.  Verbally told Dr. Alen Blew.   I talked extensively with pt today to address his frustration/confusion re: appts, rescheduling of appts. With his permission, I called [facility] to confirm pick-up and return times for next week after which I called pt to confirm his Monday SCAT pick-up of 1:00-1:30.  He acknowledged understanding.  I told patient I will provide him a written SCAT schedule and updated [place] schedule on Monday when he arrives for his first RT.  He verbalized understanding.   I plan to be with patient as much as necessary next week to provide support during this first week of tmts and procedures.  Continuing navigation as L1 patient (new patient).  [name], RN, BSN, [facility] Head & Neck Oncology Navigator [phone]

## 2014-01-17 ENCOUNTER — Telehealth: Payer: Self-pay | Admitting: *Deleted

## 2014-01-17 ENCOUNTER — Other Ambulatory Visit: Payer: Self-pay | Admitting: *Deleted

## 2014-01-17 ENCOUNTER — Ambulatory Visit (HOSPITAL_BASED_OUTPATIENT_CLINIC_OR_DEPARTMENT_OTHER): Payer: Medicare Other

## 2014-01-17 ENCOUNTER — Encounter: Payer: Self-pay | Admitting: *Deleted

## 2014-01-17 ENCOUNTER — Ambulatory Visit
Admission: RE | Admit: 2014-01-17 | Discharge: 2014-01-17 | Disposition: A | Discharge status: Home / Self Care | Payer: Medicare Other | Source: Ambulatory Visit | Attending: Radiation Oncology | Admitting: Radiation Oncology

## 2014-01-17 ENCOUNTER — Encounter: Payer: Self-pay | Admitting: Radiation Oncology

## 2014-01-17 VITALS — BP 104/61 | HR 61 | Temp 97.4°F | Ht 66.0 in | Wt 104.5 lb

## 2014-01-17 DIAGNOSIS — C159 Malignant neoplasm of esophagus, unspecified: Secondary | ICD-10-CM

## 2014-01-17 DIAGNOSIS — C153 Malignant neoplasm of upper third of esophagus: Secondary | ICD-10-CM

## 2014-01-17 DIAGNOSIS — Z510 Encounter for antineoplastic radiation therapy: Secondary | ICD-10-CM | POA: Diagnosis not present

## 2014-01-17 DIAGNOSIS — E875 Hyperkalemia: Secondary | ICD-10-CM

## 2014-01-17 MED ORDER — SODIUM CHLORIDE 0.9 % IV SOLN: INTRAVENOUS | Status: DC

## 2014-01-17 MED ORDER — BIAFINE EX EMUL
Freq: Once | CUTANEOUS | Status: AC
  Administered 2014-01-17: 15:00:00 via TOPICAL

## 2014-01-17 MED ORDER — SODIUM CHLORIDE 0.9 % IV SOLN
1000.0000 mL | Freq: Once | INTRAVENOUS | Status: AC
  Administered 2014-01-17: 1000 mL via INTRAVENOUS

## 2014-01-17 NOTE — Progress Notes (Signed)
Weekly Management Note:  outpatient Current Dose:  2 Gy  Projected Dose: 60 Gy    ICD-9-CM   1. 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 Cancer of upper third of esophagus 150.3     Narrative:  The patient presents for routine under treatment assessment.  CBCT/MVCT images/Port film x-rays were reviewed.  The chart was checked. He denies pain today. He reports trouble swallowing and has to chew his food a lot before swallowing. He is eating mostly soft foods and is drinking 2 cans each of ensure and boost per day. He has lost 2 lbs since 7/15. He has been talking to [employer], [place]. He stopped taking Coreg per Dr. Juliann Mule on Thursday.  Physical Findings: Cachectic.  No oropharyngeal lesions. No neck adenopathy. Vitals with Age-Percentiles 01/17/2014 01/12/2014 12/23/2013 12/15/2013  Length 167.6 cm 280.0 cm    Systolic 349 179 96 91  Diastolic 61 65 69 66  Pulse 61 68 63 69  Respiration  18    Weight 47.401 kg 48.49 kg    BMI 16.9 17.3    VISIT REPORT       Vitals with Age-Percentiles 12/15/2013  Length 150.5 cm  Systolic 86  Diastolic 56  Pulse  63  Respiration   Weight 48.49 kg  BMI 17.3  VISIT REPORT    CBC    Component Value Date/Time   WBC 4.1 01/12/2014 1423   WBC 5.2 04/26/2010 0642   RBC 3.96* 01/12/2014 1423   RBC 4.27 04/26/2010 0642   RBC 3.79* 10/03/2009 0445   HGB 11.4* 01/12/2014 1423   HGB 12.6* 04/26/2010 0642   HCT 35.4* 01/12/2014 1423   HCT 38.7* 04/26/2010 0642   PLT 151 01/12/2014 1423   PLT 172 04/26/2010 0642   MCV 89.4 01/12/2014 1423   MCV 90.6 04/26/2010 0642   MCH 28.7 01/12/2014 1423   MCH 29.5 04/26/2010 0642   MCHC 32.1 01/12/2014 1423   MCHC 32.6 04/26/2010 0642   RDW 13.4 01/12/2014 1423   RDW 22.2* 04/26/2010 0642   LYMPHSABS 1.2 01/12/2014 1423   LYMPHSABS 1.5 04/25/2010 1219   MONOABS 0.6 01/12/2014 1423   MONOABS 0.6 04/25/2010 1219   EOSABS 0.3 01/12/2014 1423   EOSABS 0.0 04/25/2010 1219   BASOSABS 0.0 01/12/2014 1423   BASOSABS 0.0 04/25/2010 1219     CMP     Component Value Date/Time   NA 132* 01/12/2014 1424   NA 135 09/20/2013 1441   K 5.9* 01/12/2014 1424   K 4.0 09/20/2013 1441   CL 99 09/20/2013 1441   CO2 19* 01/12/2014 1424   CO2 28 09/20/2013 1441   GLUCOSE 101 01/12/2014 1424   GLUCOSE 85 09/20/2013 1441   BUN 69.2* 01/12/2014 1424   BUN 10 09/20/2013 1441   CREATININE 2.6* 01/12/2014 1424   CREATININE 1.6* 09/20/2013 1441   CALCIUM 9.7 01/12/2014 1424   CALCIUM 9.7 09/20/2013 1441   PROT 8.4* 01/12/2014 1424   PROT 7.5 04/25/2010 1219   ALBUMIN 3.9 01/12/2014 1424   ALBUMIN 2.7* 04/25/2010 1219   AST 23 01/12/2014 1424   AST 71* 04/25/2010 1219   ALT 11 01/12/2014 1424   ALT 95* 04/25/2010 1219   ALKPHOS 69 01/12/2014 1424   ALKPHOS 164* 04/25/2010 1219   BILITOT 0.42 01/12/2014 1424   BILITOT 1.2 04/25/2010 1219   GFRNONAA >60 05/02/2010 0505   GFRAA  Value: >60        The eGFR has been calculated  using the MDRD equation. This calculation has not been validated in all clinical situations. eGFR's persistently <60 mL/min signify possible Chronic Kidney Disease. 05/02/2010 0505     Impression:  The patient is tolerating radiotherapy.   Plan:  Continue radiotherapy as planned. He was given biafine cream and was instructed to apply it to his neck/upper chest twice a day either in the morning (4 hours before treatment) or after treatment and at bedtime.  His main frustration is multiple appointments and scheduling confusion. Our navigator, Liliane Channel, will continue to work hard on minimizing confusion for the patient and trying to move up his PEG tube consultation. Until then, I encouraged Mr Stai to increase his fluid and nutritional shake intake. Sees Nutritionist tomorrow. IV fluids today. Starts chemotherapy tomorrow.   It should be noted that no hypopharyngeal mass was appreciated by ENT scoping procedure nor on PET. So, we are only treating an esophageal cancer. EUS was  not done for staging as this would not change management for him.  -----------------------------------  Eppie Gibson, MD

## 2014-01-17 NOTE — Patient Instructions (Signed)
Dehydration, Adult Dehydration is when you lose more fluids from the body than you take in. Vital organs like the kidneys, brain, and heart cannot function without a proper amount of fluids and salt. Any loss of fluids from the body can cause dehydration.  CAUSES   Vomiting.  Diarrhea.  Excessive sweating.  Excessive urine output.  Fever. SYMPTOMS  Mild dehydration  Thirst.  Dry lips.  Slightly dry mouth. Moderate dehydration  Very dry mouth.  Sunken eyes.  Skin does not bounce back quickly when lightly pinched and released.  Dark urine and decreased urine production.  Decreased tear production.  Headache. Severe dehydration  Very dry mouth.  Extreme thirst.  Rapid, weak pulse (more than 100 beats per minute at rest).  Cold hands and feet.  Not able to sweat in spite of heat and temperature.  Rapid breathing.  Blue lips.  Confusion and lethargy.  Difficulty being awakened.  Minimal urine production.  No tears. DIAGNOSIS  Your caregiver will diagnose dehydration based on your symptoms and your exam. Blood and urine tests will help confirm the diagnosis. The diagnostic evaluation should also identify the cause of dehydration. TREATMENT  Treatment of mild or moderate dehydration can often be done at home by increasing the amount of fluids that you drink. It is best to drink small amounts of fluid more often. Drinking too much at one time can make vomiting worse. Refer to the home care instructions below. Severe dehydration needs to be treated at the hospital where you will probably be given intravenous (IV) fluids that contain water and electrolytes. HOME CARE INSTRUCTIONS   Ask your caregiver about specific rehydration instructions.  Drink enough fluids to keep your urine clear or pale yellow.  Drink small amounts frequently if you have nausea and vomiting.  Eat as you normally do.  Avoid:  Foods or drinks high in sugar.  Carbonated  drinks.  Juice.  Extremely hot or cold fluids.  Drinks with caffeine.  Fatty, greasy foods.  Alcohol.  Tobacco.  Overeating.  Gelatin desserts.  Wash your hands well to avoid spreading bacteria and viruses.  Only take over-the-counter or prescription medicines for pain, discomfort, or fever as directed by your caregiver.  Ask your caregiver if you should continue all prescribed and over-the-counter medicines.  Keep all follow-up appointments with your caregiver. SEEK MEDICAL CARE IF:  You have abdominal pain and it increases or stays in one area (localizes).  You have a rash, stiff neck, or severe headache.  You are irritable, sleepy, or difficult to awaken.  You are weak, dizzy, or extremely thirsty. SEEK IMMEDIATE MEDICAL CARE IF:   You are unable to keep fluids down or you get worse despite treatment.  You have frequent episodes of vomiting or diarrhea.  You have blood or green matter (bile) in your vomit.  You have blood in your stool or your stool looks black and tarry.  You have not urinated in 6 to 8 hours, or you have only urinated a small amount of very dark urine.  You have a fever.  You faint. MAKE SURE YOU:   Understand these instructions.  Will watch your condition.  Will get help right away if you are not doing well or get worse. Document Released: 06/17/2005 Document Revised: 09/09/2011 Document Reviewed: 02/04/2011 ExitCare Patient Information 2015 [employer]. This information is not intended to replace advice given to you by your health care provider. Make sure you discuss any questions you have with your health care   provider.  

## 2014-01-17 NOTE — Progress Notes (Signed)
   IMRT Device Note  6.9  delivered field widths represent one set of IMRT treatment devices. The code is [id number].  -----------------------------------  Eppie Gibson, MD

## 2014-01-17 NOTE — Progress Notes (Addendum)
Michael Keith has completed 1/30 fractions to his upper esophagus.  He denies pain today.  He reports trouble swallowing and has to chew his food a lot before swallowing.  He is eating mostly soft foods and is drinking 2 cans each of ensure and boost per day.  He has lost 2 lbs since 7/15.  He has been talking to [employer], [place].  He stopped taking Coreg per Dr. Juliann Mule on Thursday.  He was given biafine cream and was instructed to apply it to his neck/upper chest twice a day either in the morning (4 hours before treatment) or after treatment and at bedtime.

## 2014-01-17 NOTE — Telephone Encounter (Signed)
Spoke with [facility] Triage RN Kenney Houseman.  Arranged rescheduling of patient's surgical consult for PEG placement from 7/31 to 11:30 7/29; I will inform patient when I see him today.  Requested that I be called if consult becomes available Thursday or Friday of this week.    [name], RN, BSN, [facility] Head & Neck Oncology Navigator [phone]

## 2014-01-18 ENCOUNTER — Other Ambulatory Visit: Payer: Self-pay | Admitting: Radiology

## 2014-01-18 ENCOUNTER — Telehealth: Payer: Self-pay | Admitting: *Deleted

## 2014-01-18 ENCOUNTER — Other Ambulatory Visit: Payer: Self-pay | Admitting: Oncology

## 2014-01-18 ENCOUNTER — Encounter: Payer: Self-pay | Admitting: *Deleted

## 2014-01-18 ENCOUNTER — Ambulatory Visit
Admission: RE | Admit: 2014-01-18 | Discharge: 2014-01-18 | Disposition: A | Discharge status: Home / Self Care | Payer: Medicare Other | Source: Ambulatory Visit | Attending: Radiation Oncology | Admitting: Radiation Oncology

## 2014-01-18 ENCOUNTER — Other Ambulatory Visit (HOSPITAL_BASED_OUTPATIENT_CLINIC_OR_DEPARTMENT_OTHER): Payer: Medicare Other

## 2014-01-18 ENCOUNTER — Ambulatory Visit (HOSPITAL_BASED_OUTPATIENT_CLINIC_OR_DEPARTMENT_OTHER): Payer: Medicare Other

## 2014-01-18 ENCOUNTER — Ambulatory Visit: Payer: Medicare Other | Admitting: Nutrition

## 2014-01-18 ENCOUNTER — Encounter: Payer: Self-pay | Admitting: Nutrition

## 2014-01-18 ENCOUNTER — Other Ambulatory Visit: Payer: Self-pay | Admitting: *Deleted

## 2014-01-18 VITALS — BP 110/80 | HR 76 | Temp 97.0°F | Resp 20

## 2014-01-18 DIAGNOSIS — C138 Malignant neoplasm of overlapping sites of hypopharynx: Secondary | ICD-10-CM

## 2014-01-18 DIAGNOSIS — C153 Malignant neoplasm of upper third of esophagus: Secondary | ICD-10-CM | POA: Insufficient documentation

## 2014-01-18 DIAGNOSIS — Z5111 Encounter for antineoplastic chemotherapy: Secondary | ICD-10-CM

## 2014-01-18 DIAGNOSIS — Z510 Encounter for antineoplastic radiation therapy: Secondary | ICD-10-CM | POA: Diagnosis not present

## 2014-01-18 DIAGNOSIS — C159 Malignant neoplasm of esophagus, unspecified: Secondary | ICD-10-CM

## 2014-01-18 LAB — CBC WITH DIFFERENTIAL/PLATELET
BASO%: 0.8 % (ref 0.0–2.0)
Basophils Absolute: 0 10*3/uL (ref 0.0–0.1)
EOS%: 7.3 % — AB (ref 0.0–7.0)
Eosinophils Absolute: 0.3 10*3/uL (ref 0.0–0.5)
HEMATOCRIT: 33.3 % — AB (ref 38.4–49.9)
HGB: 11 g/dL — ABNORMAL LOW (ref 13.0–17.1)
LYMPH#: 1.6 10*3/uL (ref 0.9–3.3)
LYMPH%: 41.7 % (ref 14.0–49.0)
MCH: 28.7 pg (ref 27.2–33.4)
MCHC: 33 g/dL (ref 32.0–36.0)
MCV: 86.9 fL (ref 79.3–98.0)
MONO#: 0.5 10*3/uL (ref 0.1–0.9)
MONO%: 13 % (ref 0.0–14.0)
NEUT#: 1.4 10*3/uL — ABNORMAL LOW (ref 1.5–6.5)
NEUT%: 37.2 % — AB (ref 39.0–75.0)
Platelets: 147 10*3/uL (ref 140–400)
RBC: 3.83 10*6/uL — AB (ref 4.20–5.82)
RDW: 13.9 % (ref 11.0–14.6)
WBC: 3.8 10*3/uL — AB (ref 4.0–10.3)

## 2014-01-18 LAB — COMPREHENSIVE METABOLIC PANEL (CC13)
ALT: 9 U/L (ref 0–55)
AST: 19 U/L (ref 5–34)
Albumin: 3.9 g/dL (ref 3.5–5.0)
Alkaline Phosphatase: 61 U/L (ref 40–150)
Anion Gap: 9 mEq/L (ref 3–11)
BUN: 49 mg/dL — ABNORMAL HIGH (ref 7.0–26.0)
CHLORIDE: 108 meq/L (ref 98–109)
CO2: 20 mEq/L — ABNORMAL LOW (ref 22–29)
Calcium: 9.7 mg/dL (ref 8.4–10.4)
Creatinine: 2.1 mg/dL — ABNORMAL HIGH (ref 0.7–1.3)
Glucose: 92 mg/dl (ref 70–140)
Potassium: 5 mEq/L (ref 3.5–5.1)
SODIUM: 137 meq/L (ref 136–145)
TOTAL PROTEIN: 8.3 g/dL (ref 6.4–8.3)
Total Bilirubin: 0.52 mg/dL (ref 0.20–1.20)

## 2014-01-18 MED ORDER — LORAZEPAM 2 MG/ML IJ SOLN
INTRAMUSCULAR | Status: AC
  Filled 2014-01-18: qty 1

## 2014-01-18 MED ORDER — LORAZEPAM 2 MG/ML IJ SOLN
1.0000 mg | Freq: Once | INTRAMUSCULAR | Status: AC
  Administered 2014-01-18: 1 mg via INTRAVENOUS

## 2014-01-18 MED ORDER — FAMOTIDINE IN NACL 20-0.9 MG/50ML-% IV SOLN
INTRAVENOUS | Status: AC
  Filled 2014-01-18: qty 50

## 2014-01-18 MED ORDER — SODIUM CHLORIDE 0.9 % IV SOLN
89.4000 mg | Freq: Once | INTRAVENOUS | Status: AC
  Administered 2014-01-18: 90 mg via INTRAVENOUS
  Filled 2014-01-18: qty 9

## 2014-01-18 MED ORDER — DIPHENHYDRAMINE HCL 50 MG/ML IJ SOLN
INTRAMUSCULAR | Status: AC
  Filled 2014-01-18: qty 1

## 2014-01-18 MED ORDER — SODIUM CHLORIDE 0.9 % IV SOLN
Freq: Once | INTRAVENOUS | Status: AC
  Administered 2014-01-18: 10:00:00 via INTRAVENOUS

## 2014-01-18 MED ORDER — DEXAMETHASONE SODIUM PHOSPHATE 20 MG/5ML IJ SOLN
20.0000 mg | Freq: Once | INTRAMUSCULAR | Status: AC
  Administered 2014-01-18: 20 mg via INTRAVENOUS

## 2014-01-18 MED ORDER — ONDANSETRON 16 MG/50ML IVPB (CHCC)
16.0000 mg | Freq: Once | INTRAVENOUS | Status: AC
  Administered 2014-01-18: 16 mg via INTRAVENOUS

## 2014-01-18 MED ORDER — DEXAMETHASONE SODIUM PHOSPHATE 20 MG/5ML IJ SOLN
INTRAMUSCULAR | Status: AC
  Filled 2014-01-18: qty 5

## 2014-01-18 MED ORDER — ONDANSETRON 16 MG/50ML IVPB (CHCC)
INTRAVENOUS | Status: AC
  Filled 2014-01-18: qty 16

## 2014-01-18 MED ORDER — FAMOTIDINE IN NACL 20-0.9 MG/50ML-% IV SOLN
20.0000 mg | Freq: Once | INTRAVENOUS | Status: AC
  Administered 2014-01-18: 20 mg via INTRAVENOUS

## 2014-01-18 MED ORDER — DIPHENHYDRAMINE HCL 50 MG/ML IJ SOLN
50.0000 mg | Freq: Once | INTRAMUSCULAR | Status: AC
  Administered 2014-01-18: 50 mg via INTRAVENOUS

## 2014-01-18 MED ORDER — PROCHLORPERAZINE MALEATE 10 MG PO TABS: 10.0000 mg | ORAL_TABLET | Freq: Four times a day (QID) | ORAL | Status: DC | PRN

## 2014-01-18 MED ORDER — PACLITAXEL CHEMO INJECTION 300 MG/50ML
45.0000 mg/m2 | Freq: Once | INTRAVENOUS | Status: AC
  Administered 2014-01-18: 66 mg via INTRAVENOUS
  Filled 2014-01-18: qty 11

## 2014-01-18 NOTE — Patient Instructions (Signed)
[facility] Discharge Instructions for Patients Receiving Chemotherapy  Today you received the following chemotherapy agents:  Taxol and Carboplatin  To help prevent nausea and vomiting after your treatment, we encourage you to take your nausea medication as ordered per MD.   If you develop nausea and vomiting that is not controlled by your nausea medication, call the clinic.   BELOW ARE SYMPTOMS THAT SHOULD BE REPORTED IMMEDIATELY:  *FEVER GREATER THAN 100.5 F  *CHILLS WITH OR WITHOUT FEVER  NAUSEA AND VOMITING THAT IS NOT CONTROLLED WITH YOUR NAUSEA MEDICATION  *UNUSUAL SHORTNESS OF BREATH  *UNUSUAL BRUISING OR BLEEDING  TENDERNESS IN MOUTH AND THROAT WITH OR WITHOUT PRESENCE OF ULCERS  *URINARY PROBLEMS  *BOWEL PROBLEMS  UNUSUAL RASH Items with * indicate a potential emergency and should be followed up as soon as possible.  Feel free to call the clinic you have any questions or concerns. The clinic phone number is [phone].    

## 2014-01-18 NOTE — Progress Notes (Signed)
OK to treat with WBC-3.8 and ANC-1.4 per Dr. Alen Blew.  1050-Pt restless after receiving IV Benadryl.  Dr. Alen Blew notified and order received for Ativan 1mg  IV now.  1415-Pt tolerated Taxol and Carboplatin with no difficulties.  Pt was sedated throughout treatment d/t Benadryl and Ativan.  VS remained stable.

## 2014-01-18 NOTE — Telephone Encounter (Signed)
Spoke with patient today.  He had his first taxol/carboplatin yesterday.  He states he is doing good.  He denies nausea/vomiting.  He has had no diarrhea,constipation or mouth ulcers.  He has had 3 Ensure, one Boost and also is drinking flavored water.  He states he has compazine for nausea, but has not needed to take any.  Encouraged him to call us if he has any problems or questions.  He appreciated the phone call.

## 2014-01-18 NOTE — Progress Notes (Signed)
Called pt this morning to remind of 2:10 appt today.  Met with pt when he arrived for [facility] RT, met with him during UT appt with Dr. Isidore Moos, escorted him to Infusion where I provided him a schedule for this week reflecting [place] appt time, SCAT pick-up time at his home, [facility] pick-up at [facility] to return him home.  Called SCAT late yesterday afternoon with request to pick him up earlier than scheduled b/c infusion completed early.  Informed patient.  [name], RN, BSN, [facility] Head & Neck Oncology Navigator [phone]

## 2014-01-18 NOTE — Progress Notes (Signed)
Met patient when he arrived for labs, escorted him to Infusion for 1st chemo.  On my third status check, attending RN indicated pt very sedated s/p pre-chemo benadryl and ativan given later for his agitation.  Indication clear that return home via [facility] not a safe/feasible option.  I contacted patient's cousin David to see if he could transport patient home, he indicated he could.  I cancelled [facility] appt.  S/p chemo, I escorted pt to RT for his 2nd tmt after wh/ I escorted him to lobby and waited with him until cousin arrived.  I gave David case of Ensure provided patient by Barb Neff, Nutritionist. Patient verbalized Barb's current guidance of 4 bottles/day.  I provided David a copy of the schedule I gave patient yesterday.  He indicated he is available to take patient home s/p port placement tomorrow.  I indicated I would call him with an estimated pick-up time.  Rick Diehl, RN, BSN, [facility] Head & Neck Oncology Navigator [phone]  

## 2014-01-18 NOTE — Progress Notes (Signed)
Nutrition followup with patient during chemotherapy.  Patient is heavily medicated and it was difficult to get any history from him today.  Weight decreased and was documented as 104.5 pounds July 20 from 106.9 pounds June 11.  Noted patient has a PEG scheduled for July 31.  Patient reports he is unable to consume any solid food.  He is trying to drink Ensure Plus or boost +2-3 times a day.  Oral intake isn't adequate to meet nutrition needs.  Patient is being followed closely by head and neck navigator.  Patient has many issues with transportation.  Patient at risk for dehydration. Noted patient scheduled for IVF this week.  Severe malnutrition continues.  Nutrition diagnosis: Unintended weight loss continues.  Intervention: Provided patient with second complimentary case of Ensure Plus.  Recommended patient attempt to consume 5 Ensure Plus daily.  I spoke with navigator and recommended moving feeding tube placement up if possible.    Monitoring, evaluation, goals: Patient will increase Ensure Plus to 5 cans daily, to meet estimated nutrition requirements.  Next visit: Friday, July 24, during IV fluids in the infusion room.   **Disclaimer: This note was dictated with voice recognition software. Similar sounding words can inadvertently be transcribed and this note may contain transcription errors which may not have been corrected upon publication of note.**

## 2014-01-19 ENCOUNTER — Other Ambulatory Visit: Payer: Self-pay | Admitting: Oncology

## 2014-01-19 ENCOUNTER — Encounter (HOSPITAL_COMMUNITY): Payer: Self-pay

## 2014-01-19 ENCOUNTER — Ambulatory Visit
Admission: RE | Admit: 2014-01-19 | Discharge: 2014-01-19 | Disposition: A | Discharge status: Home / Self Care | Payer: Medicare Other | Source: Ambulatory Visit | Attending: Radiation Oncology | Admitting: Radiation Oncology

## 2014-01-19 ENCOUNTER — Ambulatory Visit: Payer: Self-pay

## 2014-01-19 ENCOUNTER — Ambulatory Visit (HOSPITAL_COMMUNITY)
Admission: RE | Admit: 2014-01-19 | Discharge: 2014-01-19 | Disposition: A | Discharge status: Home / Self Care | Payer: Medicare Other | Source: Ambulatory Visit | Attending: Oncology | Admitting: Oncology

## 2014-01-19 ENCOUNTER — Encounter: Payer: Self-pay | Admitting: *Deleted

## 2014-01-19 DIAGNOSIS — Z510 Encounter for antineoplastic radiation therapy: Secondary | ICD-10-CM | POA: Diagnosis not present

## 2014-01-19 DIAGNOSIS — D649 Anemia, unspecified: Secondary | ICD-10-CM | POA: Insufficient documentation

## 2014-01-19 DIAGNOSIS — I2510 Atherosclerotic heart disease of native coronary artery without angina pectoris: Secondary | ICD-10-CM | POA: Diagnosis not present

## 2014-01-19 DIAGNOSIS — C153 Malignant neoplasm of upper third of esophagus: Secondary | ICD-10-CM

## 2014-01-19 DIAGNOSIS — I509 Heart failure, unspecified: Secondary | ICD-10-CM | POA: Diagnosis not present

## 2014-01-19 DIAGNOSIS — I5022 Chronic systolic (congestive) heart failure: Secondary | ICD-10-CM | POA: Diagnosis not present

## 2014-01-19 DIAGNOSIS — F17200 Nicotine dependence, unspecified, uncomplicated: Secondary | ICD-10-CM | POA: Insufficient documentation

## 2014-01-19 DIAGNOSIS — I059 Rheumatic mitral valve disease, unspecified: Secondary | ICD-10-CM | POA: Insufficient documentation

## 2014-01-19 LAB — PROTIME-INR
INR: 1.19 (ref 0.00–1.49)
Prothrombin Time: 15.1 seconds (ref 11.6–15.2)

## 2014-01-19 LAB — APTT: aPTT: 31 seconds (ref 24–37)

## 2014-01-19 MED ORDER — IOHEXOL 300 MG/ML  SOLN: INTRAMUSCULAR | Status: AC | PRN

## 2014-01-19 MED ORDER — LIDOCAINE HCL 1 % IJ SOLN
INTRAMUSCULAR | Status: AC
  Filled 2014-01-19: qty 20

## 2014-01-19 MED ORDER — CEFAZOLIN SODIUM-DEXTROSE 2-3 GM-% IV SOLR
INTRAVENOUS | Status: AC
  Administered 2014-01-19: 2 g via INTRAVENOUS
  Filled 2014-01-19: qty 50

## 2014-01-19 MED ORDER — CEFAZOLIN SODIUM-DEXTROSE 2-3 GM-% IV SOLR
2.0000 g | Freq: Once | INTRAVENOUS | Status: AC
Start: 2014-01-19 — End: 2014-01-19
  Administered 2014-01-19: 2 g via INTRAVENOUS

## 2014-01-19 MED ORDER — MIDAZOLAM HCL 2 MG/2ML IJ SOLN
INTRAMUSCULAR | Status: AC
  Filled 2014-01-19: qty 6

## 2014-01-19 MED ORDER — MIDAZOLAM HCL 2 MG/2ML IJ SOLN
INTRAMUSCULAR | Status: AC | PRN
  Administered 2014-01-19: 1 mg via INTRAVENOUS

## 2014-01-19 MED ORDER — SODIUM CHLORIDE 0.9 % IV SOLN
INTRAVENOUS | Status: DC
Start: 2014-01-19 — End: 2014-01-20
  Administered 2014-01-19: 13:00:00 via INTRAVENOUS

## 2014-01-19 MED ORDER — FENTANYL CITRATE 0.05 MG/ML IJ SOLN
INTRAMUSCULAR | Status: AC | PRN
  Administered 2014-01-19: 50 ug via INTRAVENOUS

## 2014-01-19 MED ORDER — FENTANYL CITRATE 0.05 MG/ML IJ SOLN
INTRAMUSCULAR | Status: AC
  Filled 2014-01-19: qty 6

## 2014-01-19 MED ORDER — LIDOCAINE-EPINEPHRINE (PF) 2 %-1:200000 IJ SOLN
INTRAMUSCULAR | Status: AC
  Filled 2014-01-19: qty 20

## 2014-01-19 NOTE — H&P (Signed)
Chief Complaint: "I'm here for a port" Referring Physician:Shadad HPI: Michael Keith is an 63 y.o. male with esophageal cancer. He is to have both radiation and chemotherapy. HE is here today for port placement. PMHx, meds, labs reviewed. Pt has been NPO since 0800  Past Medical History:  Past Medical History  Diagnosis Date  . Chronic pancreatitis     CT scan shows improving pancreatitis  . Nonischemic cardiomyopathy 09/2009    Catheterization, April, 2011, questionable occlusion of the most apical portion of the LAD, LV dysfunction out of proportion to coronary disease  . Chronic systolic CHF (congestive heart failure)     April, 2011 mild troponin elevation at that time  . Mitral regurgitation     mild, echo April 2011  . Tobacco abuse   . Alcohol use     heavy until Jan 2011, patient was counseled concerning alcohol in April 2011  . COPD (chronic obstructive pulmonary disease)   . Fluid overload 03/2010    October, 2011  . Cough Jan 2012    may be from enalapril, January, 2012  . Ejection fraction < 50%     EF 35-40%, echo, April, 2011  . CAD (coronary artery disease)     Catheterization, April, 2011, questionable occlusion of the most apical portion of the LAD, LV dysfunction out of proportion to coronary disease  . Squamous cell esophageal cancer     Past Surgical History:  Past Surgical History  Procedure Laterality Date  . Cardiac catheterization  April 2011    questionable occlusion of the most apical portion of the LAD / LV dysfunction out of proportion to coronary disease  . Wrist surgey      left wrist    Family History:  Family History  Problem Relation Age of Onset  . Diabetes Cousin   . Cancer Mother     Social History:  reports that he has been smoking Cigarettes.  He has a 15 pack-year smoking history. He has never used smokeless tobacco. He reports that he drinks alcohol. He reports that he does not use illicit drugs.  Allergies: No Known  Allergies  Medications:   Medication List    ASK your doctor about these medications       aspirin 81 MG tablet  Take 81 mg by mouth daily.     carvedilol 12.5 MG tablet  Commonly known as:  COREG  Take 12.5 mg by mouth 2 (two) times daily with a meal.     emollient cream  Commonly known as:  BIAFINE  Apply topically as needed.     furosemide 40 MG tablet  Commonly known as:  LASIX  Take 40 mg by mouth every morning.     multivitamin with minerals Tabs tablet  Take 1 tablet by mouth daily.     nitroGLYCERIN 0.4 MG SL tablet  Commonly known as:  NITROSTAT  Place 0.4 mg under the tongue every 5 (five) minutes as needed.     prochlorperazine 10 MG tablet  Commonly known as:  COMPAZINE  Take 1 tablet (10 mg total) by mouth every 6 (six) hours as needed for nausea or vomiting.     thiamine 100 MG tablet  Commonly known as:  VITAMIN B-1  Take 100 mg by mouth daily.        Please HPI for pertinent positives, otherwise complete 10 system ROS negative.  Physical Exam: BP 121/64  Pulse 73  Temp(Src) 97.6 F (36.4 C) (Oral)  Resp 20  There is no weight on file to calculate BMI.   General Appearance:  Alert, cooperative, no distress, appears stated age  Head:  Normocephalic, without obvious abnormality, atraumatic  ENT: Unremarkable  Neck: Supple, symmetrical, trachea midline  Lungs:   Clear to auscultation bilaterally, no w/r/r, respirations unlabored without use of accessory muscles.  Chest Wall:  No tenderness or deformity  Heart:  Regular rate and rhythm, S1, S2 normal, no murmur, rub or gallop.  Abdomen:   Soft, non-tender, non distended.  Neurologic: Normal affect, no gross deficits.   Results for orders placed during the hospital encounter of 01/19/14 (from the past 48 hour(s))  APTT     Status: None   Collection Time    01/19/14  1:08 PM      Result Value Ref Range   aPTT 31  24 - 37 seconds  PROTIME-INR     Status: None   Collection Time    01/19/14   1:08 PM      Result Value Ref Range   Prothrombin Time 15.1  11.6 - 15.2 seconds   INR 1.19  0.00 - 1.49   CBC    Component Value Date/Time   WBC 3.8* 01/18/2014 0859   RBC 3.83* 01/18/2014 0859   HGB 11.0* 01/18/2014 0859   HCT 33.3* 01/18/2014 0859   PLT 147 01/18/2014 0859      Assessment/Plan Esophageal cancer For Port placement Discussed procedure, risks, complications, use of sedation. Labs reviewed Consent signed in chart  Ascencion Dike PA-C 01/19/2014, 2:06 PM

## 2014-01-19 NOTE — H&P (Signed)
Agree.  For port placement today. 

## 2014-01-19 NOTE — Procedures (Signed)
Procedure:  Right IJ Power Port Access:  Right IJ vein Findings:  Cath tip at cavoatrial junction.  OK to use.

## 2014-01-19 NOTE — Discharge Instructions (Signed)
Conscious Sedation °Sedation is the use of medicines to promote relaxation and relieve discomfort and anxiety. Conscious sedation is a type of sedation. Under conscious sedation you are less alert than normal but are still able to respond to instructions or stimulation. Conscious sedation is used during short medical and dental procedures. It is milder than deep sedation or general anesthesia and allows you to return to your regular activities sooner.  °LET YOUR HEALTH CARE PROVIDER KNOW ABOUT:  °· Any allergies you have. °· All medicines you are taking, including vitamins, herbs, eye drops, creams, and over-the-counter medicines. °· Use of steroids (by mouth or creams). °· Previous problems you or members of your family have had with the use of anesthetics. °· Any blood disorders you have. °· Previous surgeries you have had. °· Medical conditions you have. °· Possibility of pregnancy, if this applies. °· Use of cigarettes, alcohol, or illegal drugs. °RISKS AND COMPLICATIONS °Generally, this is a safe procedure. However, as with any procedure, problems can occur. Possible problems include: °· Oversedation. °· Trouble breathing on your own. You may need to have a breathing tube until you are awake and breathing on your own. °· Allergic reaction to any of the medicines used for the procedure. °BEFORE THE PROCEDURE °· You may have blood tests done. These tests can help show how well your kidneys and liver are working. They can also show how well your blood clots. °· A physical exam will be done.   °· Only take medicines as directed by your health care provider. You may need to stop taking medicines (such as blood thinners, aspirin, or nonsteroidal anti-inflammatory drugs) before the procedure.   °· Do not eat or drink at least 6 hours before the procedure or as directed by your health care provider. °· Arrange for a responsible adult, family member, or friend to take you home after the procedure. He or she should stay  with you for at least 24 hours after the procedure, until the medicine has worn off. °PROCEDURE  °· An intravenous (IV) catheter will be inserted into one of your veins. Medicine will be able to flow directly into your body through this catheter. You may be given medicine through this tube to help prevent pain and help you relax. °· The medical or dental procedure will be done. °AFTER THE PROCEDURE °· You will stay in a recovery area until the medicine has worn off. Your blood pressure and pulse will be checked.   °·  Depending on the procedure you had, you may be allowed to go home when you can tolerate liquids and your pain is under control. °Document Released: 03/12/2001 Document Revised: 06/22/2013 Document Reviewed: 02/22/2013 °ExitCare® Patient Information ©2015 [employer]. This information is not intended to replace advice given to you by your health care provider. Make sure you discuss any questions you have with your health care provider. °Implanted Port Home Guide °An implanted port is a type of central line that is placed under the skin. Central lines are used to provide IV access when treatment or nutrition needs to be given through a person's veins. Implanted ports are used for long-term IV access. An implanted port may be placed because:  °· You need IV medicine that would be irritating to the small veins in your hands or arms.   °· You need long-term IV medicines, such as antibiotics.   °· You need IV nutrition for a long period.   °· You need frequent blood draws for lab tests.   °· You need dialysis.   °  Implanted ports are usually placed in the chest area, but they can also be placed in the upper arm, the abdomen, or the leg. An implanted port has two main parts:  °· Reservoir. The reservoir is round and will appear as a small, raised area under your skin. The reservoir is the part where a needle is inserted to give medicines or draw blood.   °· Catheter. The catheter is a thin, flexible tube  that extends from the reservoir. The catheter is placed into a large vein. Medicine that is inserted into the reservoir goes into the catheter and then into the vein.   °HOW WILL I CARE FOR MY INCISION SITE? °Do not get the incision site wet. Bathe or shower as directed by your health care provider.  °HOW IS MY PORT ACCESSED? °Special steps must be taken to access the port:  °· Before the port is accessed, a numbing cream can be placed on the skin. This helps numb the skin over the port site.   °· Your health care provider uses a sterile technique to access the port. °¨ Your health care provider must put on a mask and sterile gloves. °¨ The skin over your port is cleaned carefully with an antiseptic and allowed to dry. °¨ The port is gently pinched between sterile gloves, and a needle is inserted into the port. °· Only "non-coring" port needles should be used to access the port. Once the port is accessed, a blood return should be checked. This helps ensure that the port is in the vein and is not clogged.   °· If your port needs to remain accessed for a constant infusion, a clear (transparent) bandage will be placed over the needle site. The bandage and needle will need to be changed every week, or as directed by your health care provider.   °· Keep the bandage covering the needle clean and dry. Do not get it wet. Follow your health care provider's instructions on how to take a shower or bath while the port is accessed.   °· If your port does not need to stay accessed, no bandage is needed over the port.   °WHAT IS FLUSHING? °Flushing helps keep the port from getting clogged. Follow your health care provider's instructions on how and when to flush the port. Ports are usually flushed with saline solution or a medicine called heparin. The need for flushing will depend on how the port is used.  °· If the port is used for intermittent medicines or blood draws, the port will need to be flushed:   °¨ After medicines have  been given.   °¨ After blood has been drawn.   °¨ As part of routine maintenance.   °· If a constant infusion is running, the port may not need to be flushed.   °HOW LONG WILL MY PORT STAY IMPLANTED? °The port can stay in for as long as your health care provider thinks it is needed. When it is time for the port to come out, surgery will be done to remove it. The procedure is similar to the one performed when the port was put in.  °WHEN SHOULD I SEEK IMMEDIATE MEDICAL CARE? °When you have an implanted port, you should seek immediate medical care if:  °· You notice a bad smell coming from the incision site.   °· You have swelling, redness, or drainage at the incision site.   °· You have more swelling or pain at the port site or the surrounding area.   °· You have a fever that is not controlled with   medicine. °Document Released: 06/17/2005 Document Revised: 04/07/2013 Document Reviewed: 02/22/2013 °ExitCare® Patient Information ©2015 [employer]. This information is not intended to replace advice given to you by your health care provider. Make sure you discuss any questions you have with your health care provider. ° °

## 2014-01-20 ENCOUNTER — Encounter: Payer: Self-pay | Admitting: *Deleted

## 2014-01-20 ENCOUNTER — Other Ambulatory Visit: Payer: Self-pay | Admitting: *Deleted

## 2014-01-20 ENCOUNTER — Ambulatory Visit
Admission: RE | Admit: 2014-01-20 | Discharge: 2014-01-20 | Disposition: A | Discharge status: Home / Self Care | Payer: Medicare Other | Source: Ambulatory Visit | Attending: Radiation Oncology | Admitting: Radiation Oncology

## 2014-01-20 ENCOUNTER — Ambulatory Visit (HOSPITAL_BASED_OUTPATIENT_CLINIC_OR_DEPARTMENT_OTHER): Payer: Medicare Other

## 2014-01-20 VITALS — BP 99/47 | HR 64 | Temp 97.1°F

## 2014-01-20 DIAGNOSIS — Z510 Encounter for antineoplastic radiation therapy: Secondary | ICD-10-CM | POA: Diagnosis not present

## 2014-01-20 DIAGNOSIS — E875 Hyperkalemia: Secondary | ICD-10-CM

## 2014-01-20 DIAGNOSIS — C159 Malignant neoplasm of esophagus, unspecified: Secondary | ICD-10-CM

## 2014-01-20 DIAGNOSIS — C153 Malignant neoplasm of upper third of esophagus: Secondary | ICD-10-CM

## 2014-01-20 MED ORDER — HEPARIN SOD (PORK) LOCK FLUSH 100 UNIT/ML IV SOLN
500.0000 [IU] | Freq: Once | INTRAVENOUS | Status: AC | PRN
Start: 2014-01-20 — End: 2014-01-20
  Administered 2014-01-20: 500 [IU]
  Filled 2014-01-20: qty 5

## 2014-01-20 MED ORDER — LIDOCAINE-PRILOCAINE 2.5-2.5 % EX CREA: 1.0000 "application " | TOPICAL_CREAM | CUTANEOUS | Status: DC | PRN

## 2014-01-20 MED ORDER — SODIUM CHLORIDE 0.9 % IJ SOLN
10.0000 mL | INTRAMUSCULAR | Status: DC | PRN
  Administered 2014-01-20: 10 mL
  Filled 2014-01-20: qty 10

## 2014-01-20 MED ORDER — SODIUM CHLORIDE 0.9 % IV SOLN
Freq: Once | INTRAVENOUS | Status: AC
  Administered 2014-01-20: 13:00:00 via INTRAVENOUS

## 2014-01-20 NOTE — Progress Notes (Signed)
Met patient prior to schedule RT after which I escorted him to [place] Stay for [facility] placement.  Called his cousin Shanon Brow ca. 45 minutes prior to anticipated DC.  Met David at [employer] entrance, escorted him to [employer], assisted with patient transport to his car.  We discussed his availability to transport patient to surgical consult appt next Wed at 11:30.  He indicated he would provide.  [name], RN, BSN, [facility] Head & Neck Oncology Navigator [phone]

## 2014-01-20 NOTE — Progress Notes (Unsigned)
E-scribed emla cream to patient's pharmacy for use with newly placed port-a-cath. Infusion  Nurse to instruct patient on usage. Note to dr Alen Blew.

## 2014-01-20 NOTE — Patient Instructions (Signed)

## 2014-01-21 ENCOUNTER — Ambulatory Visit (INDEPENDENT_AMBULATORY_CARE_PROVIDER_SITE_OTHER): Payer: Medicare Other | Admitting: General Surgery

## 2014-01-21 ENCOUNTER — Encounter: Payer: Self-pay | Admitting: *Deleted

## 2014-01-21 ENCOUNTER — Ambulatory Visit: Payer: Medicare Other | Admitting: Nutrition

## 2014-01-21 ENCOUNTER — Ambulatory Visit (HOSPITAL_BASED_OUTPATIENT_CLINIC_OR_DEPARTMENT_OTHER): Payer: Medicare Other

## 2014-01-21 ENCOUNTER — Ambulatory Visit
Admission: RE | Admit: 2014-01-21 | Discharge: 2014-01-21 | Disposition: A | Discharge status: Home / Self Care | Payer: Medicare Other | Source: Ambulatory Visit | Attending: Radiation Oncology | Admitting: Radiation Oncology

## 2014-01-21 ENCOUNTER — Telehealth: Payer: Self-pay | Admitting: *Deleted

## 2014-01-21 VITALS — BP 116/66 | HR 64 | Temp 97.2°F | Resp 18 | Wt 106.4 lb

## 2014-01-21 DIAGNOSIS — C159 Malignant neoplasm of esophagus, unspecified: Secondary | ICD-10-CM

## 2014-01-21 DIAGNOSIS — C153 Malignant neoplasm of upper third of esophagus: Secondary | ICD-10-CM

## 2014-01-21 DIAGNOSIS — E875 Hyperkalemia: Secondary | ICD-10-CM

## 2014-01-21 DIAGNOSIS — Z510 Encounter for antineoplastic radiation therapy: Secondary | ICD-10-CM | POA: Diagnosis not present

## 2014-01-21 MED ORDER — SODIUM CHLORIDE 0.9 % IV SOLN
Freq: Once | INTRAVENOUS | Status: AC
  Administered 2014-01-21: 11:00:00 via INTRAVENOUS

## 2014-01-21 MED ORDER — HEPARIN SOD (PORK) LOCK FLUSH 100 UNIT/ML IV SOLN
500.0000 [IU] | Freq: Once | INTRAVENOUS | Status: AC | PRN
  Administered 2014-01-21: 500 [IU]
  Filled 2014-01-21: qty 5

## 2014-01-21 MED ORDER — SODIUM CHLORIDE 0.9 % IJ SOLN
10.0000 mL | INTRAMUSCULAR | Status: DC | PRN
  Administered 2014-01-21: 10 mL
  Filled 2014-01-21: qty 10

## 2014-01-21 NOTE — Progress Notes (Signed)
Met patient when he arrived for tmts, re-directed him to Infusion from RadOnc, later escorted him from Infusion to RadOnc for his daily tmt.  [name], RN, BSN, [facility] Head & Neck Oncology Navigator [phone]

## 2014-01-21 NOTE — Telephone Encounter (Signed)
Subsequent to discussion with Dr. Isidore Moos re: likelihood pt will not be able to have RT on day of PEG placement and per our discussion, called [facility] with request for a Friday surgical date for PEG placement - will allow for weekend recovery in absence of RT.  RN stated pt shd make this request of Dr. Dois Davenport RN Deneise Lever when he meets with Dr. Redmond Pulling next Wednesday.  I called patient's cousin Michael Keith, who is providing transportation next Wednesday, with this information.  He verbalized understanding.  [name], RN, BSN, [facility] Head & Neck Oncology Navigator [phone]

## 2014-01-21 NOTE — Progress Notes (Addendum)
Met patient in Infusion, provided him appt calendar and SCAT scheduling information for next week.  Provided him same for week of 8/3; explained that he will need to make SCAT arrangements for this week.  I provided him Mont Dutton' phone # should he encounter problems next week whil I am on PAL.  He verbalized understanding.  [name], RN, BSN, [facility] Head & Neck Oncology Navigator [phone]

## 2014-01-21 NOTE — Patient Instructions (Signed)
Dehydration, Adult Dehydration is when you lose more fluids from the body than you take in. Vital organs like the kidneys, brain, and heart cannot function without a proper amount of fluids and salt. Any loss of fluids from the body can cause dehydration.  CAUSES   Vomiting.  Diarrhea.  Excessive sweating.  Excessive urine output.  Fever. SYMPTOMS  Mild dehydration  Thirst.  Dry lips.  Slightly dry mouth. Moderate dehydration  Very dry mouth.  Sunken eyes.  Skin does not bounce back quickly when lightly pinched and released.  Dark urine and decreased urine production.  Decreased tear production.  Headache. Severe dehydration  Very dry mouth.  Extreme thirst.  Rapid, weak pulse (more than 100 beats per minute at rest).  Cold hands and feet.  Not able to sweat in spite of heat and temperature.  Rapid breathing.  Blue lips.  Confusion and lethargy.  Difficulty being awakened.  Minimal urine production.  No tears. DIAGNOSIS  Your caregiver will diagnose dehydration based on your symptoms and your exam. Blood and urine tests will help confirm the diagnosis. The diagnostic evaluation should also identify the cause of dehydration. TREATMENT  Treatment of mild or moderate dehydration can often be done at home by increasing the amount of fluids that you drink. It is best to drink small amounts of fluid more often. Drinking too much at one time can make vomiting worse. Refer to the home care instructions below. Severe dehydration needs to be treated at the hospital where you will probably be given intravenous (IV) fluids that contain water and electrolytes. HOME CARE INSTRUCTIONS   Ask your caregiver about specific rehydration instructions.  Drink enough fluids to keep your urine clear or pale yellow.  Drink small amounts frequently if you have nausea and vomiting.  Eat as you normally do.  Avoid:  Foods or drinks high in sugar.  Carbonated  drinks.  Juice.  Extremely hot or cold fluids.  Drinks with caffeine.  Fatty, greasy foods.  Alcohol.  Tobacco.  Overeating.  Gelatin desserts.  Wash your hands well to avoid spreading bacteria and viruses.  Only take over-the-counter or prescription medicines for pain, discomfort, or fever as directed by your caregiver.  Ask your caregiver if you should continue all prescribed and over-the-counter medicines.  Keep all follow-up appointments with your caregiver. SEEK MEDICAL CARE IF:  You have abdominal pain and it increases or stays in one area (localizes).  You have a rash, stiff neck, or severe headache.  You are irritable, sleepy, or difficult to awaken.  You are weak, dizzy, or extremely thirsty. SEEK IMMEDIATE MEDICAL CARE IF:   You are unable to keep fluids down or you get worse despite treatment.  You have frequent episodes of vomiting or diarrhea.  You have blood or green matter (bile) in your vomit.  You have blood in your stool or your stool looks black and tarry.  You have not urinated in 6 to 8 hours, or you have only urinated a small amount of very dark urine.  You have a fever.  You faint. MAKE SURE YOU:   Understand these instructions.  Will watch your condition.  Will get help right away if you are not doing well or get worse. Document Released: 06/17/2005 Document Revised: 09/09/2011 Document Reviewed: 02/04/2011 ExitCare Patient Information 2015 [employer]. This information is not intended to replace advice given to you by your health care provider. Make sure you discuss any questions you have with your health care   provider.  

## 2014-01-21 NOTE — Progress Notes (Signed)
Brief followup completed with patient.  Weight increased and documented as 106.4 pounds.  Patient denies nutritional difficulties.  Reports he has been drinking 3 oral nutrition supplements daily.  Patient denies any needs at this time.  Nutrition diagnosis: Unintended weight loss improved.  Intervention: Recommended patient increase Ensure Plus to 5 daily.  Teach back method used.  Monitoring, evaluation, goals: Patient will work to increase oral intake to promote continued weight gain.  Next visit: Tuesday, July 28.  **Disclaimer: This note was dictated with voice recognition software. Similar sounding words can inadvertently be transcribed and this note may contain transcription errors which may not have been corrected upon publication of note.**

## 2014-01-24 ENCOUNTER — Ambulatory Visit
Admission: RE | Admit: 2014-01-24 | Discharge: 2014-01-24 | Disposition: A | Discharge status: Home / Self Care | Payer: Medicare Other | Source: Ambulatory Visit | Attending: Radiation Oncology | Admitting: Radiation Oncology

## 2014-01-24 VITALS — BP 91/57 | HR 99 | Temp 98.3°F | Wt 104.6 lb

## 2014-01-24 DIAGNOSIS — Z510 Encounter for antineoplastic radiation therapy: Secondary | ICD-10-CM | POA: Diagnosis not present

## 2014-01-24 DIAGNOSIS — C153 Malignant neoplasm of upper third of esophagus: Secondary | ICD-10-CM

## 2014-01-24 DIAGNOSIS — C159 Malignant neoplasm of esophagus, unspecified: Secondary | ICD-10-CM

## 2014-01-24 MED ORDER — HYDROCODONE-ACETAMINOPHEN 7.5-325 MG/15ML PO SOLN: 10.0000 mL | Freq: Four times a day (QID) | ORAL | Status: DC | PRN

## 2014-01-24 NOTE — Progress Notes (Signed)
Patient for weekly assessment of radiation to upper esophagus.Pain on swallowing is an "6"2 lb weight loss in 1 week.Needs script for pain med.Scheduled for feeding tube evaluation on Wednesday 01/26/14.Saw nutritionist last week, currently drinking up to 4 ensure daily.Skin looks good.

## 2014-01-24 NOTE — Progress Notes (Signed)
Weekly Management Note:  Site: Upper esophagus Current Dose:  1200  cGy Projected Dose: 6000  cGy  Narrative: The patient is seen today for routine under treatment assessment. CBCT/MVCT images/port films were reviewed. The chart was reviewed.   He does report pain on swallowing (6/10). He is scheduled for placement of a feeding tube this Wednesday and chemotherapy tomorrow. His weight is down 2 pounds over the past week. He is trying to take down 4 cans of Ensure a day.  Physical Examination:  Filed Vitals:   01/24/14 1500  BP: 91/57  Pulse: 99  Temp: 98.3 F (36.8 C)  .  Weight: 104 lb 9.6 oz (47.446 kg). No change.  Laboratory data:  Lab Results  Component Value Date   WBC 3.8* 01/18/2014   HGB 11.0* 01/18/2014   HCT 33.3* 01/18/2014   MCV 86.9 01/18/2014   PLT 147 01/18/2014     Impression: Tolerating radiation therapy well.  Plan: Continue radiation therapy as planned.

## 2014-01-25 ENCOUNTER — Other Ambulatory Visit: Payer: Self-pay | Admitting: Medical Oncology

## 2014-01-25 ENCOUNTER — Ambulatory Visit: Payer: Medicare Other | Admitting: Nutrition

## 2014-01-25 ENCOUNTER — Ambulatory Visit
Admission: RE | Admit: 2014-01-25 | Discharge: 2014-01-25 | Disposition: A | Discharge status: Home / Self Care | Payer: Medicare Other | Source: Ambulatory Visit | Attending: Radiation Oncology | Admitting: Radiation Oncology

## 2014-01-25 ENCOUNTER — Ambulatory Visit: Payer: Medicare Other

## 2014-01-25 ENCOUNTER — Ambulatory Visit (HOSPITAL_BASED_OUTPATIENT_CLINIC_OR_DEPARTMENT_OTHER): Payer: Medicare Other

## 2014-01-25 ENCOUNTER — Other Ambulatory Visit (HOSPITAL_BASED_OUTPATIENT_CLINIC_OR_DEPARTMENT_OTHER): Payer: Medicare Other

## 2014-01-25 VITALS — BP 122/74 | HR 86 | Temp 97.7°F | Resp 18

## 2014-01-25 DIAGNOSIS — C159 Malignant neoplasm of esophagus, unspecified: Secondary | ICD-10-CM

## 2014-01-25 DIAGNOSIS — Z510 Encounter for antineoplastic radiation therapy: Secondary | ICD-10-CM | POA: Diagnosis not present

## 2014-01-25 DIAGNOSIS — C153 Malignant neoplasm of upper third of esophagus: Secondary | ICD-10-CM

## 2014-01-25 DIAGNOSIS — Z5111 Encounter for antineoplastic chemotherapy: Secondary | ICD-10-CM

## 2014-01-25 DIAGNOSIS — C138 Malignant neoplasm of overlapping sites of hypopharynx: Secondary | ICD-10-CM

## 2014-01-25 LAB — CBC WITH DIFFERENTIAL/PLATELET
BASO%: 0.5 % (ref 0.0–2.0)
Basophils Absolute: 0 10*3/uL (ref 0.0–0.1)
EOS%: 2.4 % (ref 0.0–7.0)
Eosinophils Absolute: 0.1 10*3/uL (ref 0.0–0.5)
HEMATOCRIT: 31.2 % — AB (ref 38.4–49.9)
HGB: 10.1 g/dL — ABNORMAL LOW (ref 13.0–17.1)
LYMPH%: 22.3 % (ref 14.0–49.0)
MCH: 28.6 pg (ref 27.2–33.4)
MCHC: 32.3 g/dL (ref 32.0–36.0)
MCV: 88.5 fL (ref 79.3–98.0)
MONO#: 0.3 10*3/uL (ref 0.1–0.9)
MONO%: 13.5 % (ref 0.0–14.0)
NEUT#: 1.6 10*3/uL (ref 1.5–6.5)
NEUT%: 61.3 % (ref 39.0–75.0)
PLATELETS: 126 10*3/uL — AB (ref 140–400)
RBC: 3.52 10*6/uL — ABNORMAL LOW (ref 4.20–5.82)
RDW: 12.8 % (ref 11.0–14.6)
WBC: 2.6 10*3/uL — ABNORMAL LOW (ref 4.0–10.3)
lymph#: 0.6 10*3/uL — ABNORMAL LOW (ref 0.9–3.3)

## 2014-01-25 LAB — COMPREHENSIVE METABOLIC PANEL (CC13)
ALT: 10 U/L (ref 0–55)
ANION GAP: 10 meq/L (ref 3–11)
AST: 22 U/L (ref 5–34)
Albumin: 3.8 g/dL (ref 3.5–5.0)
Alkaline Phosphatase: 54 U/L (ref 40–150)
BILIRUBIN TOTAL: 0.5 mg/dL (ref 0.20–1.20)
BUN: 40.9 mg/dL — ABNORMAL HIGH (ref 7.0–26.0)
CO2: 23 mEq/L (ref 22–29)
CREATININE: 1.8 mg/dL — AB (ref 0.7–1.3)
Calcium: 9.6 mg/dL (ref 8.4–10.4)
Chloride: 107 mEq/L (ref 98–109)
Glucose: 133 mg/dl (ref 70–140)
Potassium: 4.3 mEq/L (ref 3.5–5.1)
Sodium: 140 mEq/L (ref 136–145)
Total Protein: 8.1 g/dL (ref 6.4–8.3)

## 2014-01-25 MED ORDER — DEXAMETHASONE SODIUM PHOSPHATE 20 MG/5ML IJ SOLN
INTRAMUSCULAR | Status: AC
  Filled 2014-01-25: qty 5

## 2014-01-25 MED ORDER — SODIUM CHLORIDE 0.9 % IJ SOLN
10.0000 mL | INTRAMUSCULAR | Status: DC | PRN
  Administered 2014-01-25: 10 mL
  Filled 2014-01-25: qty 10

## 2014-01-25 MED ORDER — SODIUM CHLORIDE 0.9 % IV SOLN
Freq: Once | INTRAVENOUS | Status: AC
  Administered 2014-01-25: 13:00:00 via INTRAVENOUS

## 2014-01-25 MED ORDER — ONDANSETRON 16 MG/50ML IVPB (CHCC)
16.0000 mg | Freq: Once | INTRAVENOUS | Status: AC
  Administered 2014-01-25: 16 mg via INTRAVENOUS

## 2014-01-25 MED ORDER — HEPARIN SOD (PORK) LOCK FLUSH 100 UNIT/ML IV SOLN
500.0000 [IU] | Freq: Once | INTRAVENOUS | Status: AC | PRN
  Administered 2014-01-25: 500 [IU]
  Filled 2014-01-25: qty 5

## 2014-01-25 MED ORDER — PACLITAXEL CHEMO INJECTION 300 MG/50ML
45.0000 mg/m2 | Freq: Once | INTRAVENOUS | Status: AC
  Administered 2014-01-25: 66 mg via INTRAVENOUS
  Filled 2014-01-25: qty 11

## 2014-01-25 MED ORDER — ONDANSETRON 16 MG/50ML IVPB (CHCC)
INTRAVENOUS | Status: AC
  Filled 2014-01-25: qty 16

## 2014-01-25 MED ORDER — SODIUM CHLORIDE 0.9 % IJ SOLN
10.0000 mL | INTRAMUSCULAR | Status: DC | PRN
  Administered 2014-01-25: 10 mL via INTRAVENOUS
  Filled 2014-01-25: qty 10

## 2014-01-25 MED ORDER — DEXAMETHASONE SODIUM PHOSPHATE 20 MG/5ML IJ SOLN
20.0000 mg | Freq: Once | INTRAMUSCULAR | Status: AC
  Administered 2014-01-25: 20 mg via INTRAVENOUS

## 2014-01-25 MED ORDER — DIPHENHYDRAMINE HCL 50 MG/ML IJ SOLN
INTRAMUSCULAR | Status: AC
  Filled 2014-01-25: qty 1

## 2014-01-25 MED ORDER — DIPHENHYDRAMINE HCL 50 MG/ML IJ SOLN
50.0000 mg | Freq: Once | INTRAMUSCULAR | Status: AC
  Administered 2014-01-25: 50 mg via INTRAVENOUS

## 2014-01-25 MED ORDER — SODIUM CHLORIDE 0.9 % IV SOLN
100.0000 mg | Freq: Once | INTRAVENOUS | Status: AC
  Administered 2014-01-25: 100 mg via INTRAVENOUS
  Filled 2014-01-25: qty 10

## 2014-01-25 MED ORDER — FAMOTIDINE IN NACL 20-0.9 MG/50ML-% IV SOLN
INTRAVENOUS | Status: AC
  Filled 2014-01-25: qty 50

## 2014-01-25 MED ORDER — FAMOTIDINE IN NACL 20-0.9 MG/50ML-% IV SOLN
20.0000 mg | Freq: Once | INTRAVENOUS | Status: AC
  Administered 2014-01-25: 20 mg via INTRAVENOUS

## 2014-01-25 NOTE — Progress Notes (Signed)
Call from Ernestene Kiel, Nutritionist, informing office that pt will need [place] assistance with new PEG being placed tomorrow.  Call to Lurle[facility] Leyden made with [facility] and orders placed in Epic for Skilled nurse PEG teaching and 60cc's of daily water flushes.

## 2014-01-25 NOTE — Patient Instructions (Signed)
[facility] Discharge Instructions for Patients Receiving Chemotherapy  Today you received the following chemotherapy agents:  Taxol & Carboplatin  To help prevent nausea and vomiting after your treatment, we encourage you to take your nausea medication    If you develop nausea and vomiting that is not controlled by your nausea medication, call the clinic.   BELOW ARE SYMPTOMS THAT SHOULD BE REPORTED IMMEDIATELY:  *FEVER GREATER THAN 100.5 F  *CHILLS WITH OR WITHOUT FEVER  NAUSEA AND VOMITING THAT IS NOT CONTROLLED WITH YOUR NAUSEA MEDICATION  *UNUSUAL SHORTNESS OF BREATH  *UNUSUAL BRUISING OR BLEEDING  TENDERNESS IN MOUTH AND THROAT WITH OR WITHOUT PRESENCE OF ULCERS  *URINARY PROBLEMS  *BOWEL PROBLEMS  UNUSUAL RASH Items with * indicate a potential emergency and should be followed up as soon as possible.  Feel free to call the clinic you have any questions or concerns. The clinic phone number is [phone].    

## 2014-01-25 NOTE — Progress Notes (Signed)
Patient reports he is "jumpy".  Wishes his medication would "kick in".  No new weight.  Patient drinking 3 - 4 Ensure Plus daily.  Feeding tube scheduled for tomorrow.  Nutrition Diagnosis:  Unintended weight loss cannot be evaluated.  Intervention:  Recommended patient consume 5 Ensure Plus daily.   Contacted RN to be sure patient is set up with a home health agency for tube teaching.  Monitoring, Evaluation, Goals:  Patient will tolerate 5 cans of Ensure Plus daily by mouth or in feeding tube once placed and safe to use.  Next Visit: Will attempt to see patient on Friday, July 31 after radiation treatment.  **Disclaimer: This note was dictated with voice recognition software. Similar sounding words can inadvertently be transcribed and this note may contain transcription errors which may not have been corrected upon publication of note.**

## 2014-01-26 ENCOUNTER — Ambulatory Visit: Payer: Medicare Other

## 2014-01-26 ENCOUNTER — Other Ambulatory Visit: Payer: Self-pay | Admitting: Medical Oncology

## 2014-01-26 ENCOUNTER — Other Ambulatory Visit (INDEPENDENT_AMBULATORY_CARE_PROVIDER_SITE_OTHER): Payer: Self-pay | Admitting: General Surgery

## 2014-01-26 ENCOUNTER — Encounter (INDEPENDENT_AMBULATORY_CARE_PROVIDER_SITE_OTHER): Payer: Self-pay | Admitting: General Surgery

## 2014-01-26 ENCOUNTER — Ambulatory Visit (INDEPENDENT_AMBULATORY_CARE_PROVIDER_SITE_OTHER): Payer: Medicare Other | Admitting: General Surgery

## 2014-01-26 VITALS — BP 124/86 | HR 73 | Temp 97.5°F | Ht 66.0 in | Wt 109.0 lb

## 2014-01-26 DIAGNOSIS — R4702 Dysphasia: Secondary | ICD-10-CM

## 2014-01-26 DIAGNOSIS — C153 Malignant neoplasm of upper third of esophagus: Secondary | ICD-10-CM

## 2014-01-26 DIAGNOSIS — C159 Malignant neoplasm of esophagus, unspecified: Secondary | ICD-10-CM

## 2014-01-26 DIAGNOSIS — R4789 Other speech disturbances: Secondary | ICD-10-CM

## 2014-01-26 NOTE — Patient Instructions (Signed)
We need to get cardiac clearance. Once we have heart clearance for surgery, our office will contact you to schedule surgery.   Gastrostomy Tube Home Guide A gastrostomy tube is a tube that is surgically placed into the stomach. It is also called a "G-tube." G-tubes are used when a person is unable to eat and drink enough on their own to stay healthy. The tube is inserted into the stomach through a small cut (incision) in the skin. This tube is used for:  Feeding.  Giving medication. GASTROSTOMY TUBE CARE  Wash your hands with soap and water.  Remove the old dressing (if any). Some styles of G-tubes may need a dressing inserted between the skin and the G-tube. Other types of G-tubes do not require a dressing. Ask your health care provider if a dressing is needed.  Check the area where the tube enters the skin (insertion site) for redness, swelling, or pus-like (purulent) drainage. A small amount of clear or tan liquid drainage is normal. Check to make sure scar tissue (skin) is not growing around the insertion site. This could have a raised, bumpy appearance.  A cotton swab can be used to clean the skin around the tube:  When the G-tube is first put in, a normal saline solution or water can be used to clean the skin.  Mild soap and warm water can be used when the skin around the G-tube site has healed.  Roll the cotton swab around the G-tube insertion site to remove any drainage or crusting at the insertion site. STOMACH RESIDUALS Feeding tube residuals are the amount of liquids that are in the stomach at any given time. Residuals may be checked before giving feedings, medications, or as instructed by your health care provider.  Ask your health care provider if there are instances when you would not start tube feedings depending on the amount or type of contents withdrawn from the stomach.  Check residuals by attaching a syringe to the G-tube and pulling back on the syringe plunger. Note  the amount, and return the residual back into the stomach. FLUSHING THE G-TUBE  The G-tube should be periodically flushed with clean warm water to keep it from clogging.  Flush the G-tube after feedings or medications. Draw up 30 mL of warm water in a syringe. Connect the syringe to the G-tube and slowly push the water into the tube.  Do not push feedings, medications, or flushes rapidly. Flush the G-tube gently and slowly.  Only use syringes made for G-tubes to flush medications or feedings.  Your health care provider may want the G-tube flushed more often or with more water. If this is the case, follow your health care provider's instructions. FEEDINGS Your health care provider will determine whether feedings are given as a bolus (a certain amount given at one time and at scheduled times) or whether feedings will be given continuously on a feeding pump.   Formulas should be given at room temperature.  If feedings are continuous, no more than 4 hours worth of feedings should be placed in the feeding bag. This helps prevent spoilage or accidental excess infusion.  Cover and place unused formula in the refrigerator.  If feedings are continuous, stop the feedings when medications or flushes are given. Be sure to restart the feedings.  Feeding bags and syringes should be replaced as instructed by your health care provider. GIVING MEDICATION   In general, it is best if all medications are in a liquid form for G-tube  administration. Liquid medications are less likely to clog the G-tube.  Mix the liquid medication with 30 mL (or amount recommended by your health care provider) of warm water.  Draw up the medication into the syringe.  Attach the syringe to the G-tube and slowly push the mixture into the G-tube.  After giving the medication, draw up 30 mL of warm water in the syringe and slowly flush the G-tube.  For pills or capsules, check with your health care provider first before  crushing medications. Some pills are not effective if they are crushed. Some capsules are sustained-release medications.  If appropriate, crush the pill or capsule and mix with 30 mL of warm water. Using the syringe, slowly push the medication through the tube, then flush the tube with another 30 mL of tap water. G-TUBE PROBLEMS G-tube was pulled out.  Cause: May have been pulled out accidentally.  Solutions: Cover the opening with clean dressing and tape. Call your health care provider right away. The G-tube should be put in as soon as possible (within 4 hours) so the G-tube opening (tract) does not close. The G-tube needs to be put in at a health care setting. An X-ray needs to be done to confirm placement before the G-tube can be used again. Redness, irritation, soreness, or foul odor around the gastrostomy site.  Cause: May be caused by leakage or infection.  Solutions: Call your health care provider right away. Large amount of leakage of fluid or mucus-like liquid present (a large amount means it soaks clothing).  Cause: Many reasons could cause the G-tube to leak.  Solutions: Call your health care provider to discuss the amount of leakage. Skin or scar tissue appears to be growing where tube enters skin.   Cause: Tissue growth may develop around the insertion site if the G-tube is moved or pulled on excessively.  Solutions: Secure tube with tape so that excess movement does not occur. Call your health care provider. G-tube is clogged.  Cause: Thick formula or medication.  Solutions: Try to slowly push warm water into the tube with a large syringe. Never try to push any object into the tube to unclog it. Do not force fluid into the G-tube. If you are unable to unclog the tube, call your health care provider right away. TIPS  Head of bed (HOB) position refers to the upright position of a person's upper body.  When giving medications or a feeding bolus, keep the [facility] up as told by  your health care provider. Do this during the feeding and for 1 hour after the feeding or medication administration.  If continuous feedings are being given, it is best to keep the [facility] up as told by your health care provider. When ADLs (activities of daily living) are performed and the [facility] needs to be flat, be sure to turn the feeding pump off. Restart the feeding pump when the [facility] is returned to the recommended height.  Do not pull or put tension on the tube.  To prevent fluid backflow, kink the G-tube before removing the cap or disconnecting a syringe.  Check the G-tube length every day. Measure from the insertion site to the end of the G-tube. If the length is longer than previous measurements, the tube may be coming out. Call your health care provider if you notice increasing G-tube length.  Oral care, such as brushing teeth, must be continued.  You may need to remove excess air (vent) from the G-tube. Your health care provider  will tell you if this is needed.  Always call your health care provider if you have questions or problems with the G-tube. SEEK IMMEDIATE MEDICAL CARE IF:   You have severe abdominal pain, tenderness, or abdominal bloating (distension).  You have nausea or vomiting.  You are constipated or have problems moving your bowels.  The G-tube insertion site is red, swollen, has a foul smell, or has yellow or brown drainage.  You have difficulty breathing or shortness of breath.  You have a fever.  You have a large amount of feeding tube residuals.  The G-tube is clogged and cannot be flushed. MAKE SURE YOU:   Understand these instructions.  Will watch your condition.  Will get help right away if you are not doing well or get worse. Document Released: 08/26/2001 Document Revised: 11/01/2013 Document Reviewed: 02/22/2013 [facility] Patient Information 2015 [place], [place]. This information is not intended to replace advice given to you by your health care  provider. Make sure you discuss any questions you have with your health care provider.

## 2014-01-27 ENCOUNTER — Ambulatory Visit
Admission: RE | Admit: 2014-01-27 | Discharge: 2014-01-27 | Disposition: A | Discharge status: Home / Self Care | Payer: Medicare Other | Source: Ambulatory Visit | Attending: Radiation Oncology | Admitting: Radiation Oncology

## 2014-01-27 ENCOUNTER — Encounter: Payer: Self-pay | Admitting: Cardiology

## 2014-01-27 ENCOUNTER — Ambulatory Visit (INDEPENDENT_AMBULATORY_CARE_PROVIDER_SITE_OTHER): Payer: Medicare Other | Admitting: Cardiology

## 2014-01-27 VITALS — BP 88/62 | HR 78 | Ht 66.0 in | Wt 106.0 lb

## 2014-01-27 DIAGNOSIS — I509 Heart failure, unspecified: Secondary | ICD-10-CM

## 2014-01-27 DIAGNOSIS — Z510 Encounter for antineoplastic radiation therapy: Secondary | ICD-10-CM | POA: Diagnosis not present

## 2014-01-27 DIAGNOSIS — I2510 Atherosclerotic heart disease of native coronary artery without angina pectoris: Secondary | ICD-10-CM

## 2014-01-27 DIAGNOSIS — Z95828 Presence of other vascular implants and grafts: Secondary | ICD-10-CM

## 2014-01-27 DIAGNOSIS — Z01810 Encounter for preprocedural cardiovascular examination: Secondary | ICD-10-CM

## 2014-01-27 DIAGNOSIS — I5022 Chronic systolic (congestive) heart failure: Secondary | ICD-10-CM

## 2014-01-27 DIAGNOSIS — Z9889 Other specified postprocedural states: Secondary | ICD-10-CM

## 2014-01-27 DIAGNOSIS — C153 Malignant neoplasm of upper third of esophagus: Secondary | ICD-10-CM

## 2014-01-27 DIAGNOSIS — I428 Other cardiomyopathies: Secondary | ICD-10-CM

## 2014-01-27 MED ORDER — NITROGLYCERIN 0.4 MG SL SUBL: 0.4000 mg | SUBLINGUAL_TABLET | SUBLINGUAL | Status: DC | PRN

## 2014-01-27 NOTE — Progress Notes (Signed)
Patient ID: Michael Keith, male   DOB: [birth year], 63 y.o.   MRN: [id number]    HPI  The patient is seen today to followup his cardiomyopathy. He is also seen for preop cardiac clearance to have a gastrostomy placed. He has esophageal cancer. When I saw him last in March, 2015 he was having swallowing difficulties. Since that time a diagnosis of esophageal cancer has been made. He's not having any significant cardiac symptoms. Historically he has severe left ventricular dysfunction. He has not been a candidate for ICD in the past. Chronic systolic CHF has been stable. His diuretic dose has been adjusted during the time that he had decreased oral intake.  He is taking radiation and chemotherapy. He has a Port-A-Cath in the right chest.  No Known Allergies  Current Outpatient Prescriptions  Medication Sig Dispense Refill  . aspirin 81 MG tablet Take 81 mg by mouth daily.        Marland Kitchen emollient (BIAFINE) cream Apply topically as needed.      . furosemide (LASIX) 40 MG tablet Take 40 mg by mouth every morning.      Marland Kitchen HYDROCODONE-ACETAMINOPHEN PO Take 15 mLs by mouth 4 (four) times daily as needed.      . lidocaine-prilocaine (EMLA) cream Apply 1 application topically as needed.  30 g  0  . Multiple Vitamin (MULTIVITAMIN WITH MINERALS) TABS tablet Take 1 tablet by mouth daily.      . nitroGLYCERIN (NITROSTAT) 0.4 MG SL tablet Place 0.4 mg under the tongue every 5 (five) minutes as needed.        . prochlorperazine (COMPAZINE) 10 MG tablet Take 10 mg by mouth every 6 (six) hours as needed for nausea or vomiting.      . Thiamine HCl (VITAMIN B-1) 100 MG tablet Take 100 mg by mouth daily.         No current facility-administered medications for this visit.    History   Social History  . Marital Status: Single    Spouse Name: N/A    Number of Children: N/A  . Years of Education: N/A   Occupational History  . Not on file.   Social History Main Topics  . Smoking status: Former Smoker -- 0.50  packs/day for 30 years    Types: Cigarettes    Quit date: 01/19/2014  . Smokeless tobacco: Never Used     Comment: smoking 3 cigarettes per day  . Alcohol Use: Yes     Comment: heavy untill Jan 2011  . Drug Use: No  . Sexual Activity: Not on file   Other Topics Concern  . Not on file   Social History Narrative  . No narrative on file    Family History  Problem Relation Age of Onset  . Diabetes Cousin   . Cancer Mother     Past Medical History  Diagnosis Date  . Chronic pancreatitis     CT scan shows improving pancreatitis  . Nonischemic cardiomyopathy 09/2009    Catheterization, April, 2011, questionable occlusion of the most apical portion of the LAD, LV dysfunction out of proportion to coronary disease  . Chronic systolic CHF (congestive heart failure)     April, 2011 mild troponin elevation at that time  . Mitral regurgitation     mild, echo April 2011  . Tobacco abuse   . Alcohol use     heavy until Jan 2011, patient was counseled concerning alcohol in April 2011  . COPD (chronic obstructive pulmonary disease)   .  Fluid overload 03/2010    October, 2011  . Cough Jan 2012    may be from enalapril, January, 2012  . Ejection fraction < 50%     EF 35-40%, echo, April, 2011  . CAD (coronary artery disease)     Catheterization, April, 2011, questionable occlusion of the most apical portion of the LAD, LV dysfunction out of proportion to coronary disease  . Squamous cell esophageal cancer     Past Surgical History  Procedure Laterality Date  . Cardiac catheterization  April 2011    questionable occlusion of the most apical portion of the LAD / LV dysfunction out of proportion to coronary disease  . Wrist surgey      left wrist    Patient Active Problem List   Diagnosis Date Noted  . Preop cardiovascular exam 01/27/2014  . Cancer of upper third of esophagus 01/18/2014  . Squamous cell esophageal cancer 12/09/2013  . Dysphasia 09/20/2013  . Pulmonary  hypertension 08/16/2013  . Chronic systolic CHF (congestive heart failure)   . Chronic pancreatitis   . Mitral regurgitation   . Tobacco abuse   . Alcohol use   . COPD (chronic obstructive pulmonary disease)   . Ejection fraction < 50%   . CAD (coronary artery disease)   . Cough 07/01/2010  . Fluid overload 03/31/2010  . Nonischemic cardiomyopathy 09/29/2009    ROS   Patient denies fever, chills, headache, sweats, rash, change in vision, change in hearing cough, urinary symptoms. All other systems are reviewed and are negative.  PHYSICAL EXAM   Patient is oriented to person time and place. Affect is normal. He asked me several things about the planned gastrostomy. He is thin. Head is atraumatic. There is no jugulovenous distention. Lungs are clear. Respiratory effort is nonlabored. Cardiac exam reveals S1 and S2. The abdomen is soft. There is no peripheral edema.  Filed Vitals:   01/27/14 0922  Height: 5\' 6"  (1.676 m)  Weight: 106 lb (48.081 kg)    ASSESSMENT & PLAN

## 2014-01-27 NOTE — Patient Instructions (Signed)
Your physician wants you to follow-up in:  6 months. You will receive a reminder letter in the mail two months in advance. If you don't receive a letter, please call our office to schedule the follow-up appointment.   

## 2014-01-27 NOTE — Progress Notes (Signed)
Patient ID: Michael Keith, male   DOB: March 07, 1951, 63 y.o.   MRN: [id number]  Chief Complaint  Patient presents with  . eval for peg tube    HPI Michael Keith is a 63 y.o. male.  HPI 63 year old African American male referred by Dr. Alen Blew for evaluation of a gastric feeding tube. The patient had had some trouble eating and was ultimately found to have a mass in the upper third of his esophagus that was found to be cancer. He is undergoing chemotherapy and radiation. He reportedly was found not to be an operative candidate. He has had ongoing difficulty with eating foods and a feeding tube has been requested. He denies any difficulty with liquids as long as he does not drink too fast. He reports that solid food tends to get hung up. He reports some insomnia. He denies any prior abdominal surgery. He denies any current alcohol or tobacco use.  Past Medical History  Diagnosis Date  . Chronic pancreatitis     CT scan shows improving pancreatitis  . Nonischemic cardiomyopathy 09/2009    Catheterization, April, 2011, questionable occlusion of the most apical portion of the LAD, LV dysfunction out of proportion to coronary disease  . Chronic systolic CHF (congestive heart failure)     April, 2011 mild troponin elevation at that time  . Mitral regurgitation     mild, echo April 2011  . Tobacco abuse   . Alcohol use     heavy until Jan 2011, patient was counseled concerning alcohol in April 2011  . COPD (chronic obstructive pulmonary disease)   . Fluid overload 03/2010    October, 2011  . Cough Jan 2012    may be from enalapril, January, 2012  . Ejection fraction < 50%     EF 35-40%, echo, April, 2011  . CAD (coronary artery disease)     Catheterization, April, 2011, questionable occlusion of the most apical portion of the LAD, LV dysfunction out of proportion to coronary disease  . Squamous cell esophageal cancer     Past Surgical History  Procedure Laterality Date  . Cardiac  catheterization  April 2011    questionable occlusion of the most apical portion of the LAD / LV dysfunction out of proportion to coronary disease  . Wrist surgey      left wrist    Family History  Problem Relation Age of Onset  . Diabetes Cousin   . Cancer Mother     Social History History  Substance Use Topics  . Smoking status: Former Smoker -- 0.50 packs/day for 30 years    Types: Cigarettes    Quit date: 01/19/2014  . Smokeless tobacco: Never Used     Comment: smoking 3 cigarettes per day  . Alcohol Use: Yes     Comment: heavy untill Jan 2011    No Known Allergies  Current Outpatient Prescriptions  Medication Sig Dispense Refill  . aspirin 81 MG tablet Take 81 mg by mouth daily.        Marland Kitchen emollient (BIAFINE) cream Apply topically as needed.      . furosemide (LASIX) 40 MG tablet Take 40 mg by mouth every morning.      . lidocaine-prilocaine (EMLA) cream Apply 1 application topically as needed.  30 g  0  . Multiple Vitamin (MULTIVITAMIN WITH MINERALS) TABS tablet Take 1 tablet by mouth daily.      . nitroGLYCERIN (NITROSTAT) 0.4 MG SL tablet Place 0.4 mg under the tongue every  5 (five) minutes as needed.        . Thiamine HCl (VITAMIN B-1) 100 MG tablet Take 100 mg by mouth daily.        Marland Kitchen HYDROCODONE-ACETAMINOPHEN PO Take 15 mLs by mouth 4 (four) times daily as needed.      . prochlorperazine (COMPAZINE) 10 MG tablet Take 10 mg by mouth every 6 (six) hours as needed for nausea or vomiting.       No current facility-administered medications for this visit.    Review of Systems Review of Systems  Constitutional: Negative for fever, chills, appetite change and unexpected weight change.  HENT: Negative for congestion and trouble swallowing.   Eyes: Negative for visual disturbance.  Respiratory: Negative for chest tightness and shortness of breath.   Cardiovascular: Positive for leg swelling (around ankles). Negative for chest pain.       No PND, no orthopnea, no DOE   Gastrointestinal:       See HPI  Genitourinary: Negative for dysuria and hematuria.  Musculoskeletal: Negative.   Skin: Negative for rash.  Neurological: Negative for seizures and speech difficulty.  Hematological: Does not bruise/bleed easily.  Psychiatric/Behavioral: Negative for behavioral problems and confusion.    Blood pressure 124/86, pulse 73, temperature 97.5 F (36.4 C), height 5\' 6"  (1.676 m), weight 109 lb (49.442 kg).  Physical Exam Physical Exam  Vitals reviewed. Constitutional: He is oriented to person, place, and time. He appears well-developed. He appears cachectic. No distress.  HENT:  Head: Normocephalic and atraumatic.  Right Ear: External ear normal.  Left Ear: External ear normal.  Eyes: Conjunctivae are normal. No scleral icterus.  Muddy sclera  Neck: Normal range of motion. Neck supple. No tracheal deviation present. No thyromegaly present.  Cardiovascular: Normal rate, normal heart sounds and intact distal pulses.   Pulmonary/Chest: Effort normal and breath sounds normal. No respiratory distress. He has no wheezes.  Abdominal: Soft. He exhibits no distension. There is no tenderness. There is no rebound and no guarding.  Small abdomen  Musculoskeletal: Normal range of motion. He exhibits no edema and no tenderness.  Lymphadenopathy:    He has no cervical adenopathy.  Neurological: He is alert and oriented to person, place, and time. He exhibits normal muscle tone.  Skin: Skin is warm and dry. No rash noted. He is not diaphoretic. No erythema. No pallor.  Psychiatric: He has a normal mood and affect. His behavior is normal.    Data Reviewed Dr Hazeline Junker notes Dr Ron Parker note  Assessment    Cancer of the upper third esophagus Protein calorie malnutrition     Plan    I discussed his case with his medical oncologist. He does not believe he will ever be a surgical candidate with respect to esophagectomy therefore he believes a gastric feeding tube  would be okay. Therefore we discussed a laparoscopic-assisted G-tube placement. We also discussed the possibility of an open gastrotomy tube placement. I drew diagrams for the patient. He was accompanied by a friend. We discussed the risks and benefits of surgery including but not limited to bleeding, infection, injury to surrounding structures, need to convert to an upper procedure, blood clot formation, perioperative cardiac and pulmonary issues. We also discussed long-term feeding tube issues with respect to his skin issues, drainage around the tube, et Ronney Asters.  Because of his significant cardiac history I believe he needs to have cardiac clearance prior to surgery. If he is felt to be a high risk operative candidate and that he  may just have to undergo a a percutaneous feeding tube placement despite oncologic risk  I explained to patient that he would be scheduled for surgery once we obtain clearance or information back from his cardiologist.  Leighton Ruff. Redmond Pulling, MD, FACS General, Bariatric, & Minimally Invasive Surgery [facility], [place]         [facility] M 01/27/2014, 9:35 AM

## 2014-01-27 NOTE — Assessment & Plan Note (Signed)
From the cardiac viewpoint he is stable for the placement of a gastrostomy. We will communicate with his surgical team today.

## 2014-01-27 NOTE — Assessment & Plan Note (Signed)
The patient is receiving chemotherapy and radiation therapy. He needs a gastrostomy placed to help with his nutrition.

## 2014-01-27 NOTE — Assessment & Plan Note (Signed)
Historically there is question of only mild coronary disease. He is stable. He does not need any further testing.

## 2014-01-27 NOTE — Assessment & Plan Note (Signed)
He has left ventricular dysfunction out of proportion to his coronary disease. It is possible that alcohol has played a roll.  No change in therapy.

## 2014-01-27 NOTE — Assessment & Plan Note (Signed)
Medications have been limited over time for various reasons. However he is stable. No change in therapy. His diuretic dose can be adjusted during periods of variation in his fluid intake

## 2014-01-28 ENCOUNTER — Ambulatory Visit (INDEPENDENT_AMBULATORY_CARE_PROVIDER_SITE_OTHER): Payer: Medicare Other | Admitting: General Surgery

## 2014-01-28 ENCOUNTER — Ambulatory Visit
Admission: RE | Admit: 2014-01-28 | Discharge: 2014-01-28 | Disposition: A | Discharge status: Home / Self Care | Payer: Medicare Other | Source: Ambulatory Visit | Attending: Radiation Oncology | Admitting: Radiation Oncology

## 2014-01-28 ENCOUNTER — Other Ambulatory Visit (INDEPENDENT_AMBULATORY_CARE_PROVIDER_SITE_OTHER): Payer: Self-pay | Admitting: General Surgery

## 2014-01-31 ENCOUNTER — Encounter (HOSPITAL_COMMUNITY): Payer: Self-pay | Admitting: Internal Medicine

## 2014-01-31 ENCOUNTER — Inpatient Hospital Stay (HOSPITAL_COMMUNITY)
Admission: AD | Admit: 2014-01-31 | Discharge: 2014-02-07 | DRG: 374 | Disposition: A | Discharge status: Skilled Nursing Facility | Payer: Medicare Other | Source: Ambulatory Visit | Attending: Internal Medicine | Admitting: Internal Medicine

## 2014-01-31 ENCOUNTER — Ambulatory Visit: Payer: Self-pay

## 2014-01-31 ENCOUNTER — Encounter: Payer: Self-pay | Admitting: *Deleted

## 2014-01-31 ENCOUNTER — Inpatient Hospital Stay (HOSPITAL_COMMUNITY): Payer: Medicare Other

## 2014-01-31 ENCOUNTER — Encounter: Payer: Self-pay | Admitting: Radiation Oncology

## 2014-01-31 ENCOUNTER — Ambulatory Visit
Admission: RE | Admit: 2014-01-31 | Discharge: 2014-01-31 | Disposition: A | Discharge status: Home / Self Care | Payer: Medicare Other | Source: Ambulatory Visit | Attending: Radiation Oncology | Admitting: Radiation Oncology

## 2014-01-31 ENCOUNTER — Telehealth: Payer: Self-pay | Admitting: *Deleted

## 2014-01-31 VITALS — BP 98/65 | HR 98 | Temp 97.9°F | Wt 101.2 lb

## 2014-01-31 DIAGNOSIS — I509 Heart failure, unspecified: Secondary | ICD-10-CM | POA: Diagnosis present

## 2014-01-31 DIAGNOSIS — Z7982 Long term (current) use of aspirin: Secondary | ICD-10-CM

## 2014-01-31 DIAGNOSIS — N179 Acute kidney failure, unspecified: Secondary | ICD-10-CM | POA: Diagnosis present

## 2014-01-31 DIAGNOSIS — T451X5A Adverse effect of antineoplastic and immunosuppressive drugs, initial encounter: Secondary | ICD-10-CM | POA: Diagnosis present

## 2014-01-31 DIAGNOSIS — Y842 Radiological procedure and radiotherapy as the cause of abnormal reaction of the patient, or of later complication, without mention of misadventure at the time of the procedure: Secondary | ICD-10-CM | POA: Diagnosis present

## 2014-01-31 DIAGNOSIS — I428 Other cardiomyopathies: Secondary | ICD-10-CM | POA: Diagnosis present

## 2014-01-31 DIAGNOSIS — R4789 Other speech disturbances: Secondary | ICD-10-CM | POA: Diagnosis present

## 2014-01-31 DIAGNOSIS — G4700 Insomnia, unspecified: Secondary | ICD-10-CM | POA: Diagnosis present

## 2014-01-31 DIAGNOSIS — N189 Chronic kidney disease, unspecified: Secondary | ICD-10-CM | POA: Diagnosis present

## 2014-01-31 DIAGNOSIS — Z79899 Other long term (current) drug therapy: Secondary | ICD-10-CM | POA: Diagnosis not present

## 2014-01-31 DIAGNOSIS — I2510 Atherosclerotic heart disease of native coronary artery without angina pectoris: Secondary | ICD-10-CM | POA: Diagnosis present

## 2014-01-31 DIAGNOSIS — F17200 Nicotine dependence, unspecified, uncomplicated: Secondary | ICD-10-CM | POA: Diagnosis present

## 2014-01-31 DIAGNOSIS — I951 Orthostatic hypotension: Secondary | ICD-10-CM | POA: Diagnosis present

## 2014-01-31 DIAGNOSIS — D61818 Other pancytopenia: Secondary | ICD-10-CM | POA: Diagnosis present

## 2014-01-31 DIAGNOSIS — Z510 Encounter for antineoplastic radiation therapy: Secondary | ICD-10-CM | POA: Diagnosis present

## 2014-01-31 DIAGNOSIS — R059 Cough, unspecified: Secondary | ICD-10-CM

## 2014-01-31 DIAGNOSIS — R3989 Other symptoms and signs involving the genitourinary system: Secondary | ICD-10-CM | POA: Diagnosis present

## 2014-01-31 DIAGNOSIS — J449 Chronic obstructive pulmonary disease, unspecified: Secondary | ICD-10-CM | POA: Diagnosis present

## 2014-01-31 DIAGNOSIS — C153 Malignant neoplasm of upper third of esophagus: Secondary | ICD-10-CM | POA: Diagnosis present

## 2014-01-31 DIAGNOSIS — J4489 Other specified chronic obstructive pulmonary disease: Secondary | ICD-10-CM | POA: Diagnosis present

## 2014-01-31 DIAGNOSIS — N401 Enlarged prostate with lower urinary tract symptoms: Secondary | ICD-10-CM | POA: Diagnosis present

## 2014-01-31 DIAGNOSIS — I340 Nonrheumatic mitral (valve) insufficiency: Secondary | ICD-10-CM

## 2014-01-31 DIAGNOSIS — K208 Other esophagitis without bleeding: Secondary | ICD-10-CM | POA: Diagnosis present

## 2014-01-31 DIAGNOSIS — R1310 Dysphagia, unspecified: Secondary | ICD-10-CM | POA: Diagnosis present

## 2014-01-31 DIAGNOSIS — R627 Adult failure to thrive: Secondary | ICD-10-CM | POA: Diagnosis present

## 2014-01-31 DIAGNOSIS — Z833 Family history of diabetes mellitus: Secondary | ICD-10-CM

## 2014-01-31 DIAGNOSIS — T66XXXS Radiation sickness, unspecified, sequela: Secondary | ICD-10-CM | POA: Diagnosis not present

## 2014-01-31 DIAGNOSIS — N138 Other obstructive and reflux uropathy: Secondary | ICD-10-CM | POA: Diagnosis present

## 2014-01-31 DIAGNOSIS — E43 Unspecified severe protein-calorie malnutrition: Secondary | ICD-10-CM | POA: Diagnosis present

## 2014-01-31 DIAGNOSIS — K861 Other chronic pancreatitis: Secondary | ICD-10-CM | POA: Diagnosis present

## 2014-01-31 DIAGNOSIS — C159 Malignant neoplasm of esophagus, unspecified: Secondary | ICD-10-CM | POA: Diagnosis present

## 2014-01-31 DIAGNOSIS — Z7289 Other problems related to lifestyle: Secondary | ICD-10-CM

## 2014-01-31 DIAGNOSIS — R339 Retention of urine, unspecified: Secondary | ICD-10-CM | POA: Diagnosis not present

## 2014-01-31 DIAGNOSIS — R112 Nausea with vomiting, unspecified: Secondary | ICD-10-CM | POA: Diagnosis not present

## 2014-01-31 DIAGNOSIS — R4702 Dysphasia: Secondary | ICD-10-CM

## 2014-01-31 DIAGNOSIS — Z789 Other specified health status: Secondary | ICD-10-CM

## 2014-01-31 DIAGNOSIS — D702 Other drug-induced agranulocytosis: Secondary | ICD-10-CM | POA: Diagnosis present

## 2014-01-31 DIAGNOSIS — Z931 Gastrostomy status: Secondary | ICD-10-CM | POA: Diagnosis not present

## 2014-01-31 DIAGNOSIS — Z809 Family history of malignant neoplasm, unspecified: Secondary | ICD-10-CM

## 2014-01-31 DIAGNOSIS — E860 Dehydration: Secondary | ICD-10-CM | POA: Diagnosis not present

## 2014-01-31 DIAGNOSIS — R9430 Abnormal result of cardiovascular function study, unspecified: Secondary | ICD-10-CM

## 2014-01-31 DIAGNOSIS — R05 Cough: Secondary | ICD-10-CM

## 2014-01-31 DIAGNOSIS — Z720 Tobacco use: Secondary | ICD-10-CM | POA: Diagnosis present

## 2014-01-31 DIAGNOSIS — R000 Tachycardia, unspecified: Secondary | ICD-10-CM | POA: Diagnosis not present

## 2014-01-31 DIAGNOSIS — R0989 Other specified symptoms and signs involving the circulatory and respiratory systems: Secondary | ICD-10-CM | POA: Diagnosis not present

## 2014-01-31 DIAGNOSIS — Z681 Body mass index (BMI) 19 or less, adult: Secondary | ICD-10-CM | POA: Diagnosis not present

## 2014-01-31 DIAGNOSIS — I5022 Chronic systolic (congestive) heart failure: Secondary | ICD-10-CM | POA: Diagnosis present

## 2014-01-31 HISTORY — DX: Unspecified severe protein-calorie malnutrition: E43

## 2014-01-31 LAB — CBC WITH DIFFERENTIAL/PLATELET
BASOS PCT: 0 % (ref 0–1)
Basophils Absolute: 0 10*3/uL (ref 0.0–0.1)
EOS PCT: 2 % (ref 0–5)
Eosinophils Absolute: 0 10*3/uL (ref 0.0–0.7)
HEMATOCRIT: 26.8 % — AB (ref 39.0–52.0)
Hemoglobin: 9.2 g/dL — ABNORMAL LOW (ref 13.0–17.0)
LYMPHS PCT: 20 % (ref 12–46)
Lymphs Abs: 0.3 10*3/uL — ABNORMAL LOW (ref 0.7–4.0)
MCH: 29.4 pg (ref 26.0–34.0)
MCHC: 34.3 g/dL (ref 30.0–36.0)
MCV: 85.6 fL (ref 78.0–100.0)
Monocytes Absolute: 0.3 10*3/uL (ref 0.1–1.0)
Monocytes Relative: 17 % — ABNORMAL HIGH (ref 3–12)
NEUTROS ABS: 0.9 10*3/uL — AB (ref 1.7–7.7)
NEUTROS PCT: 61 % (ref 43–77)
Platelets: 105 10*3/uL — ABNORMAL LOW (ref 150–400)
RBC: 3.13 MIL/uL — AB (ref 4.22–5.81)
RDW: 12.7 % (ref 11.5–15.5)
WBC: 1.5 10*3/uL — ABNORMAL LOW (ref 4.0–10.5)

## 2014-01-31 LAB — COMPREHENSIVE METABOLIC PANEL
ALBUMIN: 3.5 g/dL (ref 3.5–5.2)
ALT: 10 U/L (ref 0–53)
AST: 22 U/L (ref 0–37)
Alkaline Phosphatase: 58 U/L (ref 39–117)
Anion gap: 12 (ref 5–15)
BILIRUBIN TOTAL: 0.3 mg/dL (ref 0.3–1.2)
BUN: 51 mg/dL — ABNORMAL HIGH (ref 6–23)
CALCIUM: 8.9 mg/dL (ref 8.4–10.5)
CHLORIDE: 102 meq/L (ref 96–112)
CO2: 23 meq/L (ref 19–32)
CREATININE: 1.65 mg/dL — AB (ref 0.50–1.35)
GFR calc Af Amer: 49 mL/min — ABNORMAL LOW (ref >90)
GFR, EST NON AFRICAN AMERICAN: 42 mL/min — AB (ref >90)
Glucose, Bld: 178 mg/dL — ABNORMAL HIGH (ref 70–99)
Potassium: 4.4 mEq/L (ref 3.7–5.3)
SODIUM: 137 meq/L (ref 137–147)
Total Protein: 7.9 g/dL (ref 6.0–8.3)

## 2014-01-31 LAB — PROTIME-INR
INR: 1.1 (ref 0.00–1.49)
Prothrombin Time: 14.2 seconds (ref 11.6–15.2)

## 2014-01-31 LAB — APTT: APTT: 39 s — AB (ref 24–37)

## 2014-01-31 LAB — TSH: TSH: 0.61 u[IU]/mL (ref 0.350–4.500)

## 2014-01-31 LAB — MAGNESIUM: MAGNESIUM: 2.4 mg/dL (ref 1.5–2.5)

## 2014-01-31 LAB — PHOSPHORUS: PHOSPHORUS: 3.8 mg/dL (ref 2.3–4.6)

## 2014-01-31 MED ORDER — POLYETHYLENE GLYCOL 3350 17 G PO PACK
17.0000 g | PACK | Freq: Every day | ORAL | Status: DC | PRN
  Filled 2014-01-31: qty 1

## 2014-01-31 MED ORDER — ENOXAPARIN SODIUM 30 MG/0.3ML ~~LOC~~ SOLN
30.0000 mg | SUBCUTANEOUS | Status: DC
  Administered 2014-02-01: 30 mg via SUBCUTANEOUS
  Filled 2014-01-31 (×2): qty 0.3

## 2014-01-31 MED ORDER — BOOST PLUS PO LIQD
237.0000 mL | Freq: Three times a day (TID) | ORAL | Status: DC
  Administered 2014-02-01: 237 mL via ORAL
  Filled 2014-01-31 (×9): qty 237

## 2014-01-31 MED ORDER — NICOTINE 14 MG/24HR TD PT24
14.0000 mg | MEDICATED_PATCH | Freq: Every day | TRANSDERMAL | Status: DC
  Administered 2014-01-31 – 2014-02-07 (×7): 14 mg via TRANSDERMAL
  Filled 2014-01-31 (×8): qty 1

## 2014-01-31 MED ORDER — PRUTECT EX EMUL
CUTANEOUS | Status: DC | PRN
  Filled 2014-01-31: qty 45

## 2014-01-31 MED ORDER — ADULT MULTIVITAMIN W/MINERALS CH
1.0000 | ORAL_TABLET | Freq: Every day | ORAL | Status: DC
  Administered 2014-02-01: 1 via ORAL
  Filled 2014-01-31 (×5): qty 1

## 2014-01-31 MED ORDER — LIDOCAINE-PRILOCAINE 2.5-2.5 % EX CREA
1.0000 "application " | TOPICAL_CREAM | CUTANEOUS | Status: DC | PRN
  Filled 2014-01-31: qty 5

## 2014-01-31 MED ORDER — ENOXAPARIN SODIUM 40 MG/0.4ML ~~LOC~~ SOLN
40.0000 mg | SUBCUTANEOUS | Status: DC
  Administered 2014-01-31: 40 mg via SUBCUTANEOUS
  Filled 2014-01-31: qty 0.4

## 2014-01-31 MED ORDER — SODIUM CHLORIDE 0.9 % IV BOLUS (SEPSIS)
500.0000 mL | Freq: Once | INTRAVENOUS | Status: AC
  Administered 2014-01-31: 500 mL via INTRAVENOUS

## 2014-01-31 MED ORDER — ALBUTEROL SULFATE (2.5 MG/3ML) 0.083% IN NEBU
2.5000 mg | INHALATION_SOLUTION | RESPIRATORY_TRACT | Status: DC | PRN
  Administered 2014-02-07: 2.5 mg via RESPIRATORY_TRACT
  Filled 2014-01-31: qty 3

## 2014-01-31 MED ORDER — BISACODYL 10 MG RE SUPP: 10.0000 mg | Freq: Every day | RECTAL | Status: DC | PRN

## 2014-01-31 MED ORDER — MORPHINE SULFATE 2 MG/ML IJ SOLN: 1.0000 mg | INTRAMUSCULAR | Status: DC | PRN

## 2014-01-31 MED ORDER — FLEET ENEMA 7-19 GM/118ML RE ENEM: 1.0000 | ENEMA | Freq: Once | RECTAL | Status: AC | PRN

## 2014-01-31 MED ORDER — VITAMIN B-1 100 MG PO TABS
100.0000 mg | ORAL_TABLET | Freq: Every day | ORAL | Status: DC
  Administered 2014-02-01: 100 mg via ORAL
  Filled 2014-01-31 (×5): qty 1

## 2014-01-31 MED ORDER — HYDROCODONE-ACETAMINOPHEN 7.5-325 MG/15ML PO SOLN
15.0000 mL | ORAL | Status: DC | PRN
Start: 2014-01-31 — End: 2014-02-05

## 2014-01-31 MED ORDER — ONDANSETRON HCL 4 MG/2ML IJ SOLN: 4.0000 mg | Freq: Four times a day (QID) | INTRAMUSCULAR | Status: DC | PRN

## 2014-01-31 MED ORDER — ACETAMINOPHEN 325 MG PO TABS
650.0000 mg | ORAL_TABLET | Freq: Four times a day (QID) | ORAL | Status: DC | PRN
  Administered 2014-02-01 (×2): 650 mg via ORAL
  Filled 2014-01-31 (×2): qty 2

## 2014-01-31 MED ORDER — ONDANSETRON HCL 4 MG PO TABS: 4.0000 mg | ORAL_TABLET | Freq: Four times a day (QID) | ORAL | Status: DC | PRN

## 2014-01-31 MED ORDER — SODIUM CHLORIDE 0.9 % IV SOLN
INTRAVENOUS | Status: DC
  Administered 2014-01-31 – 2014-02-03 (×6): via INTRAVENOUS

## 2014-01-31 MED ORDER — ACETAMINOPHEN 650 MG RE SUPP: 650.0000 mg | Freq: Four times a day (QID) | RECTAL | Status: DC | PRN

## 2014-01-31 MED ORDER — SODIUM CHLORIDE 0.9 % IJ SOLN
3.0000 mL | Freq: Two times a day (BID) | INTRAMUSCULAR | Status: DC
  Administered 2014-02-03 – 2014-02-06 (×2): 3 mL via INTRAVENOUS

## 2014-01-31 MED ORDER — IPRATROPIUM BROMIDE 0.02 % IN SOLN
0.5000 mg | RESPIRATORY_TRACT | Status: DC | PRN
  Administered 2014-02-07: 0.5 mg via RESPIRATORY_TRACT
  Filled 2014-01-31: qty 2.5

## 2014-01-31 MED ORDER — ENSURE COMPLETE PO LIQD
237.0000 mL | Freq: Two times a day (BID) | ORAL | Status: DC
  Administered 2014-02-01: 237 mL via ORAL

## 2014-01-31 MED ORDER — DOCUSATE SODIUM 100 MG PO CAPS
100.0000 mg | ORAL_CAPSULE | Freq: Two times a day (BID) | ORAL | Status: DC
  Administered 2014-02-03 – 2014-02-06 (×3): 100 mg via ORAL
  Filled 2014-01-31 (×15): qty 1

## 2014-01-31 MED ORDER — NITROGLYCERIN 0.4 MG SL SUBL: 0.4000 mg | SUBLINGUAL_TABLET | SUBLINGUAL | Status: DC | PRN

## 2014-01-31 MED ORDER — ASPIRIN EC 81 MG PO TBEC
81.0000 mg | DELAYED_RELEASE_TABLET | Freq: Every day | ORAL | Status: DC
  Administered 2014-02-01: 81 mg via ORAL
  Filled 2014-01-31: qty 1

## 2014-01-31 NOTE — H&P (Signed)
[facility] History and Physical  CRUE OTERO KWI:[id number] DOB: [birth year] DOA: 01/31/2014  Referring physician: Dr. Isidore Moos PCP: Philis Fendt, MD   Chief Complaint: Dehydration  HPI: Michael Keith is a 63 y.o. male  African American gentleman with history of chronic pancreatitis, nonischemic cardiomyopathy, chronic systolic CHF, history of tobacco abuse, COPD, coronary artery disease, squamous cell esophageal cancer undergoing chemotherapy and radiation who was receiving radiation therapy when it was noted that her pulse could not be obtained it was noted to be dehydrated with systolic blood pressures in the 90s. Will call to directly admit the patient for further evaluation and management. Patient states has lost about 3 pounds over the past weekend. Patient endorses poor oral intake and only able to tolerate liquids. Patient endorses some chills and generalized weakness. Patient also endorses a cough of clear sputum. Patient denies any fevers, no nausea, no vomiting, no chest pain, no shortness of breath, no lightheadedness, no dizziness, no syncope, no presyncope, no abdominal pain, no dysuria, no melena, no hematemesis, no hematochezia, no hemoptysis. Patient does state that ever since starting radiation is at increased urine output. Patient also endorses poor oral intake. Patient states he was evaluated by general surgery for feeding tube placement which is currently pending. Patient stated that he was seen by cardiology, Dr. Ron Parker for preop clearance and was told he was clear to undergo feeding tube placement.   Review of Systems: As per history of present illness otherwise negative. Constitutional:  No weight loss, night sweats, Fevers, chills, fatigue.  HEENT:  No headaches, Difficulty swallowing,Tooth/dental problems,Sore throat,  No sneezing, itching, ear ache, nasal congestion, post nasal drip,  Cardio-vascular:  No chest pain, Orthopnea, PND, swelling in lower extremities,  anasarca, dizziness, palpitations  GI:  No heartburn, indigestion, abdominal pain, nausea, vomiting, diarrhea, change in bowel habits, loss of appetite  Resp:  No shortness of breath with exertion or at rest. No excess mucus, no productive cough, No non-productive cough, No coughing up of blood.No change in color of mucus.No wheezing.No chest wall deformity  Skin:  no rash or lesions.  GU:  no dysuria, change in color of urine, no urgency or frequency. No flank pain.  Musculoskeletal:  No joint pain or swelling. No decreased range of motion. No back pain.  Psych:  No change in mood or affect. No depression or anxiety. No memory loss.   Past Medical History  Diagnosis Date  . Chronic pancreatitis     CT scan shows improving pancreatitis  . Nonischemic cardiomyopathy 09/2009    Catheterization, April, 2011, questionable occlusion of the most apical portion of the LAD, LV dysfunction out of proportion to coronary disease  . Chronic systolic CHF (congestive heart failure)     April, 2011 mild troponin elevation at that time  . Mitral regurgitation     mild, echo April 2011  . Tobacco abuse   . Alcohol use     heavy until Jan 2011, patient was counseled concerning alcohol in April 2011  . COPD (chronic obstructive pulmonary disease)   . Fluid overload 03/2010    October, 2011  . Cough Jan 2012    may be from enalapril, January, 2012  . Ejection fraction < 50%     EF 35-40%, echo, April, 2011  . CAD (coronary artery disease)     Catheterization, April, 2011, questionable occlusion of the most apical portion of the LAD, LV dysfunction out of proportion to coronary disease  . Squamous cell esophageal cancer   .  Severe protein-calorie malnutrition 01/31/2014   Past Surgical History  Procedure Laterality Date  . Cardiac catheterization  April 2011    questionable occlusion of the most apical portion of the LAD / LV dysfunction out of proportion to coronary disease  . Wrist surgey       left wrist   Social History:  reports that he has been smoking Cigarettes.  He has a 15 pack-year smoking history. He has never used smokeless tobacco. He reports that he drinks alcohol. He reports that he does not use illicit drugs.  No Known Allergies  Family History  Problem Relation Age of Onset  . Diabetes Cousin   . Cancer Mother      Prior to Admission medications   Medication Sig Start Date End Date Taking? Authorizing Provider  aspirin 81 MG tablet Take 81 mg by mouth daily.     Yes Historical Provider, MD  furosemide (LASIX) 40 MG tablet Take 40 mg by mouth every morning.   Yes Historical Provider, MD  HYDROCODONE-ACETAMINOPHEN PO Take 15 mLs by mouth 4 (four) times daily as needed.   Yes Historical Provider, MD  Multiple Vitamin (MULTIVITAMIN WITH MINERALS) TABS tablet Take 1 tablet by mouth daily.   Yes Historical Provider, MD  Thiamine HCl (VITAMIN B-1) 100 MG tablet Take 100 mg by mouth daily.     Yes Historical Provider, MD  emollient (BIAFINE) cream Apply topically as needed (radiation side effects.).     Historical Provider, MD  lidocaine-prilocaine (EMLA) cream Apply 1 application topically as needed (port access.). 01/20/14   Wyatt Portela, MD  nitroGLYCERIN (NITROSTAT) 0.4 MG SL tablet Place 0.4 mg under the tongue every 5 (five) minutes as needed for chest pain. 01/27/14   Carlena Bjornstad, MD   Physical Exam: Filed Vitals:   01/31/14 1725  BP: 116/75  Pulse: 77  Temp: 97.1 F (36.2 C)  TempSrc: Oral  Resp: 16  SpO2: 100%    Wt Readings from Last 3 Encounters:  01/31/14 45.904 kg (101 lb 3.2 oz)  01/27/14 48.081 kg (106 lb)  01/26/14 49.442 kg (109 lb)    General:  Cachectic frail appearing gentleman in no acute cardiopulmonary distress.  Eyes: PERRLA, EOMI, arcus senalis, normal lids, irises & conjunctiva ENT: grossly normal hearing, lips & tongue. Poor dentition. Neck: no LAD, masses or thyromegaly Cardiovascular: Tachycardic, no m/r/g. No LE  edema. Respiratory: Some coarse breath sounds diet diffusely. No wheezing. No crackles. Normal respiratory effort. Abdomen: soft, ntnd, positive bowel sounds, no rebound, no guarding. Skin: no rash or induration seen on limited exam Musculoskeletal: grossly normal tone BUE/BLE Psychiatric: grossly normal mood and affect, speech fluent and appropriate Neurologic: Alert and oriented x3. Cranial nerves II through XII are grossly intact. Sensation is intact. Gait not tested secondary to safety.           Labs on Admission:  Basic Metabolic Panel:  Recent Labs Lab 01/25/14 1220  NA 140  K 4.3  CO2 23  GLUCOSE 133  BUN 40.9*  CREATININE 1.8*  CALCIUM 9.6   Liver Function Tests:  Recent Labs Lab 01/25/14 1220  AST 22  ALT 10  ALKPHOS 54  BILITOT 0.50  PROT 8.1  ALBUMIN 3.8   No results found for this basename: LIPASE, AMYLASE,  in the last 168 hours No results found for this basename: AMMONIA,  in the last 168 hours CBC:  Recent Labs Lab 01/25/14 1220  WBC 2.6*  NEUTROABS 1.6  HGB 10.1*  HCT 31.2*  MCV 88.5  PLT 126*   Cardiac Enzymes: No results found for this basename: CKTOTAL, CKMB, CKMBINDEX, TROPONINI,  in the last 168 hours  BNP (last 3 results) No results found for this basename: PROBNP,  in the last 8760 hours CBG: No results found for this basename: GLUCAP,  in the last 168 hours  Radiological Exams on Admission: No results found.  EKG: None  Assessment/Plan Principal Problem:   Dehydration Active Problems:   Nonischemic cardiomyopathy   Tobacco abuse   COPD (chronic obstructive pulmonary disease)   CAD (coronary artery disease)   Chronic systolic CHF (congestive heart failure)   Dysphasia   Squamous cell esophageal cancer   Cancer of upper third of esophagus   Orthostasis   FTT (failure to thrive) in adult   Severe protein-calorie malnutrition   #1 dehydration/orthostasis Likely secondary to poor oral intake in the setting of  esophageal cancer currently undergoing chemotherapy and radiation. Patient states he's had poor intake and only able to tolerate liquids. Will check a CBC, CMET, UA with cultures and sensitivities, chest x-ray. Place on gentle IV hydration. Hold diuretics. Monitor fluid status closely. Follow.  #2 dysphagia Secondary to squamous cell esophageal cancer currently undergoing chemotherapy and radiation therapy. We'll place on full liquid diet. We'll need to inform general surgery tomorrow of patient's admission to determine whether to undergo ahead and place feeding tube.  #3 squamous cell esophageal cancer Currently undergoing chemotherapy and radiation therapy. Will inform oncology of patient's admission.  #4 severe protein calorie malnutrition Secondary to problems #2 and 3. Consult with dietitian. Place on ensure and boost. Gentle IV hydration. Follow.  #5 failure to thrive Secondary to problem #3.  #6 nonischemic cardiomyopathy/coronary artery disease/chronic CHF Stable. Will hold diuretics secondary to problem #1. Continue aspirin.  #7 tobacco abuse Tobacco cessation. Place on a nicotine patch.  #8 COPD Stable. We'll place on nebulizers as needed.  #9 prophylaxis Lovenox for DVT prophylaxis.  Code Status: Full DVT Prophylaxis: Lovenox Family Communication: Updated patient no family present. Disposition Plan: Admit to telemetry.  Time spent: 45 mins  [facility] MD [facility] Pager [phone]  **Disclaimer: This note may have been dictated with voice recognition software. Similar sounding words can inadvertently be transcribed and this note may contain transcription errors which may not have been corrected upon publication of note.**

## 2014-01-31 NOTE — Progress Notes (Signed)
   Weekly Management Note:  outpatient Current Dose:  20 Gy  Projected Dose: 60 Gy    ICD-9-CM  1. Cancer of upper third of esophagus 150.3   Narrative:  The patient presents for routine under treatment assessment.  CBCT/MVCT images/Port film x-rays were reviewed.  The chart was checked. He c/o pain and difficulty when swallowing. He states he even has difficulty swallowing water. He appears fatigued. Nursing was unable to obtain a pulse during standing vitals since it is" irregular." He has lost 5 lbs since 01/27/14. PEG tube placement not scheduled until Aug 18th.     Physical Findings:  weight is 101 lb 3.2 oz (45.904 kg). His temperature is 97.9 F (36.6 C). His blood pressure is 98/65 and his pulse is 98.  Oral mucosa is moist and intact. Skin in treatment field intact. Heart:rate regular and slightly tachycardic   CBC    Component Value Date/Time   WBC 2.6* 01/25/2014 1220   WBC 5.2 04/26/2010 0642   RBC 3.52* 01/25/2014 1220   RBC 4.27 04/26/2010 0642   RBC 3.79* 10/03/2009 0445   HGB 10.1* 01/25/2014 1220   HGB 12.6* 04/26/2010 0642   HCT 31.2* 01/25/2014 1220   HCT 38.7* 04/26/2010 0642   PLT 126* 01/25/2014 1220   PLT 172 04/26/2010 0642   MCV 88.5 01/25/2014 1220   MCV 90.6 04/26/2010 0642   MCH 28.6 01/25/2014 1220   MCH 29.5 04/26/2010 0642   MCHC 32.3 01/25/2014 1220   MCHC 32.6 04/26/2010 0642   RDW 12.8 01/25/2014 1220   RDW 22.2* 04/26/2010 0642   LYMPHSABS 0.6* 01/25/2014 1220   LYMPHSABS 1.5 04/25/2010 1219   MONOABS 0.3 01/25/2014 1220   MONOABS 0.6 04/25/2010 1219   EOSABS 0.1 01/25/2014 1220   EOSABS 0.0 04/25/2010 1219   BASOSABS 0.0 01/25/2014 1220   BASOSABS 0.0 04/25/2010 1219     CMP     Component Value Date/Time   NA 140 01/25/2014 1220   NA 135 09/20/2013 1441   K 4.3 01/25/2014 1220   K 4.0 09/20/2013 1441   CL 99 09/20/2013 1441   CO2 23 01/25/2014 1220   CO2 28 09/20/2013 1441   GLUCOSE 133 01/25/2014 1220   GLUCOSE 85 09/20/2013 1441   BUN 40.9*  01/25/2014 1220   BUN 10 09/20/2013 1441   CREATININE 1.8* 01/25/2014 1220   CREATININE 1.6* 09/20/2013 1441   CALCIUM 9.6 01/25/2014 1220   CALCIUM 9.7 09/20/2013 1441   PROT 8.1 01/25/2014 1220   PROT 7.5 04/25/2010 1219   ALBUMIN 3.8 01/25/2014 1220   ALBUMIN 2.7* 04/25/2010 1219   AST 22 01/25/2014 1220   AST 71* 04/25/2010 1219   ALT 10 01/25/2014 1220   ALT 95* 04/25/2010 1219   ALKPHOS 54 01/25/2014 1220   ALKPHOS 164* 04/25/2010 1219   BILITOT 0.50 01/25/2014 1220   BILITOT 1.2 04/25/2010 1219   GFRNONAA >60 05/02/2010 0505   GFRAA  Value: >60        The eGFR has been calculated using the MDRD equation. This calculation has not been validated in all clinical situations. eGFR's persistently <60 mL/min signify possible Chronic Kidney Disease. 05/02/2010 0505     Impression:  The patient is tolerating radiotherapy but failing to thrive with dysphagia, poor nutrition, dehydration, orthostatsis  Plan:  Continue radiotherapy as planned. Admit to [facility] for issues as above. I will contact Dr Redmond Pulling to expedite PEG placement.  -----------------------------------  Eppie Gibson, MD

## 2014-01-31 NOTE — Progress Notes (Signed)
Michael Keith has received 10 fractions to his Upper Esophagus.  He c/o pain and difficulty when swallowing.  He states he even has difficulty swallowing water.  He appears fatigue.  Unable to obtain a pulse during vitals since it is irregular.  He has lost 5 lbs since 01/27/14.  Oral mucosa is moist and intact.  Skin in treatment field intact.

## 2014-01-31 NOTE — Progress Notes (Signed)
Mr. Michael Keith has been admitted to room 1439 in the care of the [facility], Team [address] [place] [address].  Called and gave report to Dominica and informed her that we were instrcuted to relay to her to call the [facility] after he arrives to the unit and she stated understanding.  Escorted to 1439 per Hulan Fess.

## 2014-01-31 NOTE — Progress Notes (Signed)
Rx Brief Lovenox note Wt=45.9 kg CrCl~29 ml/min Adjusted Lovenox to 30mg  daily for DVT prophylaxis in pt with CrCl<30. Thanks! Dorrene German 01/31/2014 11:20 PM

## 2014-02-01 ENCOUNTER — Ambulatory Visit
Admission: RE | Admit: 2014-02-01 | Discharge: 2014-02-01 | Disposition: A | Discharge status: Home / Self Care | Payer: Medicare Other | Source: Ambulatory Visit | Attending: Radiation Oncology | Admitting: Radiation Oncology

## 2014-02-01 ENCOUNTER — Encounter: Payer: Self-pay | Admitting: *Deleted

## 2014-02-01 DIAGNOSIS — E860 Dehydration: Secondary | ICD-10-CM

## 2014-02-01 DIAGNOSIS — E46 Unspecified protein-calorie malnutrition: Secondary | ICD-10-CM

## 2014-02-01 DIAGNOSIS — J449 Chronic obstructive pulmonary disease, unspecified: Secondary | ICD-10-CM

## 2014-02-01 DIAGNOSIS — I509 Heart failure, unspecified: Secondary | ICD-10-CM

## 2014-02-01 DIAGNOSIS — C159 Malignant neoplasm of esophagus, unspecified: Secondary | ICD-10-CM

## 2014-02-01 DIAGNOSIS — D702 Other drug-induced agranulocytosis: Secondary | ICD-10-CM

## 2014-02-01 DIAGNOSIS — R627 Adult failure to thrive: Secondary | ICD-10-CM

## 2014-02-01 DIAGNOSIS — F17200 Nicotine dependence, unspecified, uncomplicated: Secondary | ICD-10-CM

## 2014-02-01 DIAGNOSIS — R944 Abnormal results of kidney function studies: Secondary | ICD-10-CM

## 2014-02-01 DIAGNOSIS — R1310 Dysphagia, unspecified: Secondary | ICD-10-CM

## 2014-02-01 DIAGNOSIS — R799 Abnormal finding of blood chemistry, unspecified: Secondary | ICD-10-CM

## 2014-02-01 DIAGNOSIS — I5022 Chronic systolic (congestive) heart failure: Secondary | ICD-10-CM

## 2014-02-01 DIAGNOSIS — C153 Malignant neoplasm of upper third of esophagus: Principal | ICD-10-CM

## 2014-02-01 LAB — BASIC METABOLIC PANEL
Anion gap: 11 (ref 5–15)
BUN: 46 mg/dL — ABNORMAL HIGH (ref 6–23)
CHLORIDE: 104 meq/L (ref 96–112)
CO2: 22 mEq/L (ref 19–32)
Calcium: 8.7 mg/dL (ref 8.4–10.5)
Creatinine, Ser: 1.49 mg/dL — ABNORMAL HIGH (ref 0.50–1.35)
GFR calc Af Amer: 55 mL/min — ABNORMAL LOW (ref >90)
GFR calc non Af Amer: 48 mL/min — ABNORMAL LOW (ref >90)
GLUCOSE: 85 mg/dL (ref 70–99)
POTASSIUM: 4.3 meq/L (ref 3.7–5.3)
Sodium: 137 mEq/L (ref 137–147)

## 2014-02-01 LAB — URINALYSIS, ROUTINE W REFLEX MICROSCOPIC
Bilirubin Urine: NEGATIVE
Glucose, UA: NEGATIVE mg/dL
HGB URINE DIPSTICK: NEGATIVE
Ketones, ur: NEGATIVE mg/dL
Leukocytes, UA: NEGATIVE
Nitrite: NEGATIVE
PH: 5.5 (ref 5.0–8.0)
Protein, ur: NEGATIVE mg/dL
SPECIFIC GRAVITY, URINE: 1.015 (ref 1.005–1.030)
Urobilinogen, UA: 0.2 mg/dL (ref 0.0–1.0)

## 2014-02-01 LAB — CBC
HCT: 25.8 % — ABNORMAL LOW (ref 39.0–52.0)
Hemoglobin: 8.7 g/dL — ABNORMAL LOW (ref 13.0–17.0)
MCH: 28.8 pg (ref 26.0–34.0)
MCHC: 33.7 g/dL (ref 30.0–36.0)
MCV: 85.4 fL (ref 78.0–100.0)
Platelets: 105 10*3/uL — ABNORMAL LOW (ref 150–400)
RBC: 3.02 MIL/uL — AB (ref 4.22–5.81)
RDW: 12.8 % (ref 11.5–15.5)
WBC: 1.3 10*3/uL — AB (ref 4.0–10.5)

## 2014-02-01 LAB — PREALBUMIN: PREALBUMIN: 23.2 mg/dL (ref 17.0–34.0)

## 2014-02-01 LAB — GLUCOSE, CAPILLARY: Glucose-Capillary: 75 mg/dL (ref 70–99)

## 2014-02-01 MED ORDER — SUCRALFATE 1 GM/10ML PO SUSP
1.0000 g | Freq: Three times a day (TID) | ORAL | Status: DC
  Administered 2014-02-01 – 2014-02-05 (×6): 1 g via ORAL
  Filled 2014-02-01 (×19): qty 10

## 2014-02-01 NOTE — Progress Notes (Addendum)
INITIAL NUTRITION ASSESSMENT  DOCUMENTATION CODES Per approved criteria  -Severe malnutrition in the context of chronic illness -Underweight  Pt meets criteria for severe MALNUTRITION in the context of chronic illness as evidenced by 6.5% body weight loss in < one month, PO intake < 75% for > one month.   INTERVENTION: -Recommend Ensure Complete po BID, each supplement provides 350 kcal and 13 grams of protein -Recommend Boost Plus TID, each supplement provides 360 kcal and 14 gram protein -Encouraged intake of high kcal/protein liquids available on full liquid diet  NUTRITION DIAGNOSIS: Inadequate oral intake related to difficulty swallowing/throat pain as evidenced by PO intake < 75%, ongoing weight loss.   Goal: Pt to meet >/= 90% of their estimated nutrition needs    Monitor:  Total protein/energy intake, labs, weights, swallow profile, TF access/orders  Reason for Assessment: Consult/MST/Underweight BMI  63 y.o. 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 Last seen on 01/25/2014, recommended pt consume 5 Ensure Complete supplements daily. Pt reported to only be able to tolerate 2-3 supplements daily. Endorsed throat pain and cough that have contributed to decreased intake -Pt enjoys both Boost and Ensure supplements. Will recommend BID and TID of each supplement for variety of taste and  options. -Feeding tube placement pending per MD note, will possibly be placed during admit.  -On full liquid diet, consuming < 50%. Encouraged intake of high kcal/protein foods that pt can tolerate- ice cream, pudding, milk, yogurt. Assisted pt in ordering meal -BUN/Crt elevated d/t dehydration  Height: Ht Readings from Last 1 Encounters:  01/31/14 5\' 6"  (1.676 m)    Weight: Wt Readings from Last 1 Encounters:  02/01/14 100 lb 11.2 oz (45.677 kg)    Ideal Body Weight: 142 lbs  % Ideal Body Weight: 70%  Wt Readings from Last 10 Encounters:  02/01/14 100 lb 11.2 oz (45.677 kg)  01/31/14 101 lb 3.2 oz (45.904 kg)  01/27/14 106 lb (48.081 kg)  01/26/14 109 lb (49.442 kg)  01/24/14 104 lb 9.6 oz (47.446 kg)  01/21/14 106 lb 6.4 oz (48.263 kg)  01/17/14 104 lb 8 oz (47.401 kg)  01/12/14 106 lb 14.4 oz (48.49 kg)  12/15/13 106 lb 14.4 oz (48.49 kg)  12/09/13 106 lb 14.4 oz (48.49 kg)    Usual Body Weight: 107 lbs per previous medical records  % Usual Body Weight: 93%  BMI:  Body mass index is 16.26 kg/(m^2). Underweight  Estimated Nutritional Needs: Kcal: 1600-1800 Protein: 80-90 gram Fluid: >/= 1600 ml/daily  Skin: WDL  Diet Order: Full Liquid  EDUCATION NEEDS: -No education needs identified at this time   Intake/Output Summary (Last 24 hours) at 02/01/14 1049 Last data filed at 02/01/14 0208  Gross per 24 hour  Intake 273.75 ml  Output    200 ml  Net  73.75 ml    Last BM: 8/03   Labs:   Recent Labs Lab 01/25/14 1220 01/31/14 1825 02/01/14 0412  NA 140 137 137  K 4.3 4.4 4.3  CL  --  102 104  CO2 23 23 22   BUN 40.9* 51* 46*  CREATININE 1.8* 1.65* 1.49*  CALCIUM 9.6 8.9 8.7  MG  --  2.4  --   PHOS  --  3.8  --   GLUCOSE 133 178* 85    CBG (last 3)   Recent Labs  02/01/14 0729  GLUCAP 75    Scheduled Meds: . aspirin EC  81 mg Oral Daily  . docusate sodium  100 mg Oral BID  . enoxaparin (LOVENOX) injection  30 mg Subcutaneous Q24H  .  feeding supplement (ENSURE COMPLETE)  237 mL Oral BID BM  . lactose free nutrition  237 mL Oral TID WC  . multivitamin with minerals  1 tablet Oral Daily  . nicotine  14 mg Transdermal Daily  . sodium chloride  3 mL Intravenous Q12H  . thiamine  100 mg Oral Daily    Continuous Infusions: . sodium chloride 75 mL/hr at 02/01/14 0201    Past Medical History  Diagnosis Date  . Chronic pancreatitis     CT scan shows improving pancreatitis  . Nonischemic cardiomyopathy 09/2009    Catheterization, April, 2011, questionable occlusion of the most apical portion of the LAD, LV dysfunction out of proportion to coronary disease  . Chronic systolic CHF (congestive heart failure)     April, 2011 mild troponin elevation at that time  . Mitral regurgitation     mild, echo April 2011  . Tobacco abuse   . Alcohol use     heavy until Jan 2011, patient was counseled concerning alcohol in April 2011  . COPD (chronic obstructive pulmonary disease)   . Fluid overload 03/2010    October, 2011  . Cough Jan 2012    may be from enalapril, January, 2012  . Ejection fraction < 50%     EF 35-40%, echo, April, 2011  . CAD (coronary artery disease)     Catheterization, April, 2011, questionable occlusion of the most apical portion of the LAD, LV dysfunction out of proportion to coronary disease  . Squamous cell esophageal cancer   . Severe protein-calorie malnutrition 01/31/2014    Past Surgical History  Procedure Laterality Date  . Cardiac catheterization  April 2011    questionable occlusion of the most apical portion of the LAD / LV dysfunction out of proportion to coronary disease  . Wrist surgey      left wrist    Atlee Abide MS RD LDN Clinical Dietitian [phone]

## 2014-02-01 NOTE — Progress Notes (Signed)
[facility] Therapy Treatment Record Phone [phone]   Radiation Therapy was administered to Michael Keith on: 02/01/2014  2:30 PM and was treatment # 11 out of a planned course of 30 treatments.

## 2014-02-01 NOTE — Progress Notes (Signed)
[facility] PROGRESS NOTE  Michael Keith LKG:[id number] DOB: 05-01-1951 DOA: 01/31/2014 PCP: Philis Fendt, MD  Assessment/Plan: 1. Dehydration/orthostasis: due to poor PO intake -continue IVF's -continue holding diuretics and antihypertensive agents -close follow up and reassessment of volume status -CCS contacted for assistance with feeding tube placement  2. Squamous cell esophageal cancer: actively receiving chemotherapy and radiation therapy -oncology on board, will follow rec's -plan is to continue radiation (next therapy today 8/4)  3.  Dysphagia: secondary to cancer and most likely esophagitis from radiation -will continue PPI -start carafate  4.   Pancytopenia: per oncology  5.  Chronic systolyc heart failure and CAD: compensated. No CP or SOB. -holding diuretics and ASA -continue daily weights -Strict I's and O's  6.  COPD: compensated: continue PRN albuterol  7.  Tobacco abuse: cessation counseling provided -started on nicotine patch  8.  Severe protein calorie malnutrition: continue feeding supplements -CCS evaluating for feeding tube placement and hopefully better nutrition  DVT: lovenox   Code Status: Full Family Communication: no family at bedside Disposition Plan: to be determine   Consultants:  Oncology   CCS  Procedures:  None   Antibiotics:  None   HPI/Subjective: Feeling slightly better; no nausea or vomiting. Complaining of throat pain and poor appetite   Objective: Filed Vitals:   02/01/14 1348  BP: 119/71  Pulse: 72  Temp: 97.7 F (36.5 C)  Resp: 16    Intake/Output Summary (Last 24 hours) at 02/01/14 1458 Last data filed at 02/01/14 1400  Gross per 24 hour  Intake 393.75 ml  Output    675 ml  Net -281.25 ml   Filed Weights   01/31/14 1725 02/01/14 0502  Weight: 45.904 kg (101 lb 3.2 oz) 45.677 kg (100 lb 11.2 oz)    Exam:   General:  Frail, cachetic and no severe distress, afebrile and complaining of  throat/upper mid chest discomfort  Cardiovascular: S1 and S2, regular rate; no rubs or gallops; no JVD  Respiratory: good air movement, no crackles or wheezing  Abdomen: soft, NT, ND, positive BS  Musculoskeletal: no cyanosis or edema  Data Reviewed: Basic Metabolic Panel:  Recent Labs Lab 01/31/14 1825 02/01/14 0412  NA 137 137  K 4.4 4.3  CL 102 104  CO2 23 22  GLUCOSE 178* 85  BUN 51* 46*  CREATININE 1.65* 1.49*  CALCIUM 8.9 8.7  MG 2.4  --   PHOS 3.8  --    Liver Function Tests:  Recent Labs Lab 01/31/14 1825  AST 22  ALT 10  ALKPHOS 58  BILITOT 0.3  PROT 7.9  ALBUMIN 3.5   CBC:  Recent Labs Lab 01/31/14 1825 02/01/14 0412  WBC 1.5* 1.3*  NEUTROABS 0.9*  --   HGB 9.2* 8.7*  HCT 26.8* 25.8*  MCV 85.6 85.4  PLT 105* 105*   CBG:  Recent Labs Lab 02/01/14 0729  GLUCAP 75    No results found for this or any previous visit (from the past 240 hour(s)).   Studies: Portable Chest 1 View  01/31/2014   CLINICAL DATA:  Cough and chest congestion.  EXAM: PORTABLE CHEST - 1 VIEW  COMPARISON:  04/25/2010.  FINDINGS: Normal sized heart with a marked interval decrease in size. Clear lungs. The lungs remain hyperexpanded. Right jugular port catheter tip in the superior vena cava. Mild scoliosis.  IMPRESSION: No acute abnormality.  Stable cardiomegaly.   Electronically Signed   By: Enrique Sack M.D.   On: 01/31/2014 18:57  Scheduled Meds: . aspirin EC  81 mg Oral Daily  . docusate sodium  100 mg Oral BID  . enoxaparin (LOVENOX) injection  30 mg Subcutaneous Q24H  . feeding supplement (ENSURE COMPLETE)  237 mL Oral BID BM  . lactose free nutrition  237 mL Oral TID WC  . multivitamin with minerals  1 tablet Oral Daily  . nicotine  14 mg Transdermal Daily  . sodium chloride  3 mL Intravenous Q12H  . thiamine  100 mg Oral Daily   Continuous Infusions: . sodium chloride 75 mL/hr at 02/01/14 1352    Principal Problem:   Dehydration Active Problems:    Nonischemic cardiomyopathy   Tobacco abuse   COPD (chronic obstructive pulmonary disease)   CAD (coronary artery disease)   Chronic systolic CHF (congestive heart failure)   Dysphasia   Squamous cell esophageal cancer   Cancer of upper third of esophagus   Orthostasis   FTT (failure to thrive) in adult   Severe protein-calorie malnutrition    Time spent: >30 minutes    Barton Dubois  [facility] Pager [phone]. If 7PM-7AM, please contact night-coverage at [email or web address], password [facility] 02/01/2014, 2:58 PM  LOS: 1 day

## 2014-02-01 NOTE — Evaluation (Signed)
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 Pt will benefit from PT to address problems listed in note below in order to safely return to his home.    Follow Up Recommendations No PT follow up    Equipment Recommendations  None recommended by PT    Recommendations for Other Services       Precautions / Restrictions Precautions Precautions: Fall      Mobility  Bed Mobility Overal bed mobility: Needs Assistance Bed Mobility: Supine to Sit;Sit to Supine     Supine to sit: Supervision Sit to supine: Supervision   General bed mobility comments: no assist required  Transfers Overall transfer level: Needs assistance   Transfers: Sit to/from Stand           General transfer comment: mildly unsteady upon standing  Ambulation/Gait Ambulation/Gait assistance: Min guard Ambulation Distance (Feet): 200 Feet Assistive device: None Gait Pattern/deviations: Step-through pattern   Gait velocity interpretation: >2.62 ft/sec, indicative of independent community ambulator General Gait Details: gait is  steady for level surface.  Stairs            Wheelchair Mobility    Modified Rankin (Stroke Patients Only)       Balance                                             Pertinent Vitals/Pain na    Home Living Family/patient expects to be discharged  to:: Private residence Living Arrangements: Alone Available Help at Discharge: Friend(s);Neighbor;Available PRN/intermittently Type of Home: House Home Access: Stairs to enter Entrance Stairs-Rails: Psychiatric nurse of Steps: 7 Home Layout: Multi-level Home Equipment: Cane - single point;Walker - 2 wheels Additional Comments: friends provide transportation    Prior Function           Comments: independent within his home     Hand Dominance        Extremity/Trunk Assessment               Lower Extremity Assessment: Overall WFL for tasks assessed      Cervical / Trunk Assessment: Normal  Communication   Communication: No difficulties  Cognition                            General Comments      Exercises        Assessment/Plan    PT Assessment Patient needs continued PT services  PT Diagnosis Difficulty walking   PT Problem List Decreased activity tolerance  PT Treatment Interventions DME instruction;Gait training;Stair training;Functional mobility training;Therapeutic activities;Therapeutic exercise;Patient/family education   PT Goals (Current goals can be found in the Care Plan section) Acute Rehab PT Goals Patient Stated Goal: to go home PT Goal Formulation: With patient Time For Goal Achievement: 02/15/14 Potential to Achieve Goals: Good  Frequency Min 3X/week   Barriers to discharge        Co-evaluation               End of Session   Activity Tolerance: Patient tolerated treatment well Patient left: in bed;with call bell/phone within reach Nurse Communication: Mobility status         Time: 1340-1351 PT Time Calculation (min): 11 min   Charges:   PT Evaluation $Initial PT Evaluation Tier I: 1 Procedure     PT G CodesClaretha Cooper 02/01/2014, 2:08 PM Tresa Endo PT [phone]

## 2014-02-01 NOTE — Progress Notes (Signed)
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    Will need further instruction--theraband placed in his bag until he can complete them with supervision  Follow Up Recommendations  No OT follow up    Equipment Recommendations  None recommended by OT    Recommendations for Other Services      Precautions / Restrictions Precautions Precautions: Fall Restrictions Weight Bearing Restrictions: No       Mobility Bed Mobility             Transfers                  Balance                                   ADL                                             Vision                     Perception     Praxis      Cognition   Behavior During Therapy: WFL for tasks assessed/performed Overall Cognitive Status: Within Functional Limits for tasks assessed                       Extremity/Trunk Assessment  Upper Extremity Assessment Upper Extremity Assessment: Overall WFL for tasks assessed (strength grossly 4/5)      Cervical / Trunk Assessment Cervical / Trunk Assessment: Normal    Exercises Other Exercises Other Exercises: Educated on therband exercises with orange theraband (horizontal abduction and  shoulder flexion).  Pt stated that R pect area/portacath area sore with one rep of horizontal abduction.   He also stated that something made it sore during XRT, but RN checked portacath and it was OK. Discontinued this exercise for now and only performed LUE shoulder flexion x 10 reps.  Will have pt perform other exercises when soreness is gone; plan to add shoulder extension also, but RUE will involved in holding band at shoulder height, so did not educate him on this yet.   Withheld exercise sheets at this time until he performs exercises correctly without pain.  Theraband was placed in his red bag in room.   Shoulder Instructions       General Comments      Pertinent Vitals/ Pain       Pt reports soreness at portacath site/R pect:  Not rated.  Reports that RN looked at this and it is OK.  Modified exercises to only perform theraband exercises with R shoulder until pain subsides  Home Living Family/patient expects to  be discharged to:: Private residence Living Arrangements: Alone Available Help at Discharge: Friend(s);Neighbor;Available PRN/intermittently Type of Home: House Home Access: Stairs to enter [employer] of Steps: 7 Entrance Stairs-Rails: Right;Left Home Layout: Multi-level Alternate Level Stairs-Number of Steps: 7 steps between levels with rails Alternate Level Stairs-Rails: Right;Left Bathroom Shower/Tub: Tub/shower unit Shower/tub characteristics: Architectural technologist: Standard     Home Equipment: Building services engineer Comments: pt has been sponge bathing per MD.  Feet tend to swell and are prone to edema      Prior Functioning/Environment          Comments: friends and family provide transportation and help with outside work.  Pt has ensure, liquids.  Looking at tube placement while an inpatient   Frequency Min 2X/week     Progress Toward Goals  OT Goals(current goals can now be found in the care plan section)  Progress towards OT goals:  Progressing toward goals  Acute Rehab OT Goals Patient Stated Goal: to go home OT Goal Formulation: With patient Time For Goal Achievement: 02/15/14 Potential to Achieve Goals: Good ADL Goals Pt Will Perform Grooming: with supervision;standing Pt Will Transfer to Toilet: with supervision;regular height toilet;ambulating Pt/caregiver will Perform Home Exercise Program: Increased strength;Right Upper extremity;Left upper extremity;With Supervision;With theraband;With written HEP provided Additional ADL Goal #1: pt will gather clothes and complete adls at supervision level  Plan      Co-evaluation                 End of Session     Activity Tolerance Patient tolerated treatment well   Patient Left in bed;with call bell/phone within reach   Nurse Communication          Time: 2094-7096 OT Time Calculation (min): 8 min  Charges: OT General Charges $OT Visit: 1 Procedure  OT Treatments  $Therapeutic Exercise: 8-22 mins  Michael Keith 02/01/2014, 4:14 PM Michael Keith, OTR/L [phone] 02/01/2014

## 2014-02-01 NOTE — Progress Notes (Signed)
Subjective: 63 year old [place] American male referred by Dr. Alen Blew for evaluation of a gastric feeding tube. The patient had had some trouble eating and was ultimately found to have a mass in the upper third of his esophagus that was found to be cancer. He is undergoing chemotherapy and radiation. He reportedly was found not to be an operative candidate. He has had ongoing difficulty with eating foods and a feeding tube has been requested. He denies any difficulty with liquids as long as he does not drink too fast. He reports that solid food tends to get hung up. He reports some insomnia. He denies any prior abdominal surgery. He denies any current alcohol or tobacco use He was seen by Dr. Redmond Pulling on 01/27/14 for PEG placement evaluation, and they discussed laparoscopic-assisted Gastrostomy tube placement.  He was to obtain cardiac clearance before surgery and it had not been scheduled. There is a note from Dr. Ron Parker, and he notes pt has mild coronary disease and needs no further testing.  He also needed clearance from his insurance company at that visit. He was admitted to the hospital yesterday after being seen for radiation therapy. They had trouble finding his pulse, he was dehydrated with BP in the 90's.  He reported a 3 pound weight loss over the weekend, and he says he has lost 9 pounds since he was seen on 01/27/14.  He was readmitted with dehydration, failure to thrive, and we are ask to see about gastrostomy placement.  Objective: Vital signs in last 24 hours: Temp:  [97.1 F (36.2 C)-98.4 F (36.9 C)] 98.4 F (36.9 C) (08/04 0502) Pulse Rate:  [77-98] 83 (08/04 0502) Resp:  [16-18] 18 (08/04 0502) BP: (98-116)/(65-75) 104/68 mmHg (08/04 0502) SpO2:  [98 %-100 %] 98 % (08/04 0502) Weight:  [45.677 kg (100 lb 11.2 oz)-45.904 kg (101 lb 3.2 oz)] 45.677 kg (100 lb 11.2 oz) (08/04 0502) Last BM Date: 01/31/14 Afebrile, VSS BP up from yesterday's 90's. Chronic real disease, with creatinine  of 1.49 this AM H/H down some to 8.7/25.8 Wbc 1.3 Intake/Output from previous day: 08/03 0701 - 08/04 0700 In: 273.8 [I.V.:273.8] Out: 200 [Urine:200] Intake/Output this shift:    General appearance: alert, cooperative, no distress and elderly male, with weight loss and very deconditioned. Resp: clear to auscultation bilaterally Chest wall: no tenderness, right porta cath in place Cardio: regular rate and rhythm, S1, S2 normal, no murmur, click, rub or gallop GI: soft, non-tender; bowel sounds normal; no masses,  no organomegaly and no prior surgery Extremities: trace edema.  Lab Results:   Recent Labs  01/31/14 1825 02/01/14 0412  WBC 1.5* 1.3*  HGB 9.2* 8.7*  HCT 26.8* 25.8*  PLT 105* 105*    BMET  Recent Labs  01/31/14 1825 02/01/14 0412  NA 137 137  K 4.4 4.3  CL 102 104  CO2 23 22  GLUCOSE 178* 85  BUN 51* 46*  CREATININE 1.65* 1.49*  CALCIUM 8.9 8.7   PT/INR  Recent Labs  01/31/14 1825  LABPROT 14.2  INR 1.10     Recent Labs Lab 01/25/14 1220 01/31/14 1825  AST 22 22  ALT 10 10  ALKPHOS 54 58  BILITOT 0.50 0.3  PROT 8.1 7.9  ALBUMIN 3.8 3.5     Lipase     Component Value Date/Time   LIPASE 33 10/24/2009 1908     Studies/Results: Portable Chest 1 View  01/31/2014   CLINICAL DATA:  Cough and chest congestion.  EXAM: PORTABLE CHEST -  1 VIEW  COMPARISON:  04/25/2010.  FINDINGS: Normal sized heart with a marked interval decrease in size. Clear lungs. The lungs remain hyperexpanded. Right jugular port catheter tip in the superior vena cava. Mild scoliosis.  IMPRESSION: No acute abnormality.  Stable cardiomegaly.   Electronically Signed   By: Enrique Sack M.D.   On: 01/31/2014 18:57    Medications: . aspirin EC  81 mg Oral Daily  . docusate sodium  100 mg Oral BID  . enoxaparin (LOVENOX) injection  30 mg Subcutaneous Q24H  . feeding supplement (ENSURE COMPLETE)  237 mL Oral BID BM  . lactose free nutrition  237 mL Oral TID WC  .  multivitamin with minerals  1 tablet Oral Daily  . nicotine  14 mg Transdermal Daily  . sodium chloride  3 mL Intravenous Q12H  . thiamine  100 mg Oral Daily   . sodium chloride 75 mL/hr at 02/01/14 0201   Prior to Admission medications   Medication Sig Start Date End Date Taking? Authorizing Provider  aspirin 81 MG tablet Take 81 mg by mouth daily.     Yes Historical Provider, MD  furosemide (LASIX) 40 MG tablet Take 40 mg by mouth every morning.   Yes Historical Provider, MD  HYDROCODONE-ACETAMINOPHEN PO Take 15 mLs by mouth 4 (four) times daily as needed.   Yes Historical Provider, MD  Multiple Vitamin (MULTIVITAMIN WITH MINERALS) TABS tablet Take 1 tablet by mouth daily.   Yes Historical Provider, MD  Thiamine HCl (VITAMIN B-1) 100 MG tablet Take 100 mg by mouth daily.     Yes Historical Provider, MD  emollient (BIAFINE) cream Apply topically as needed (radiation side effects.).     Historical Provider, MD  lidocaine-prilocaine (EMLA) cream Apply 1 application topically as needed (port access.). 01/20/14   Wyatt Portela, MD  nitroGLYCERIN (NITROSTAT) 0.4 MG SL tablet Place 0.4 mg under the tongue every 5 (five) minutes as needed for chest pain. 01/27/14   Carlena Bjornstad, MD     Assessment/Plan 1.  Dehydration/orthostasis 2.  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  Dysphagia secondary to cancer. 4.  CAD/hx of CM EF 09-47%/SJ of systolic CHF/ Dr. Cleatis Polka - Cardiology 5.  Hx of ETOH/tobacco abuse 6. Hx of chronic pancreatitis 7.  COPD 8.  PCM/deconditioning 9.  CHRONIC renal disease 10.  Pancytopenia (WBC1.3, H/H 8.7/25.8, platelets 105K)   Plan:  I will discuss with Dr. Hassell Done.  I have some concern with his current low WBC.  Will discuss with medicine also. Recheck labs in AM.     LOS: 1 day    Mykenzie Ebanks 02/01/2014

## 2014-02-01 NOTE — Progress Notes (Signed)
IP PROGRESS NOTE  Subjective:   Events noted patient know to me with upper esophageal squamous cell carcinoma. Patient currently receiving radiation therapy concomitantly with weekly carboplatin and Taxol. Last treatment given on 01/25/2014. Patient was hospitalized for dehydration and failure to thrive. This morning he feels relatively fair without any specific complaints. He is reporting some throat pain.  Objective:  Vital signs in last 24 hours: Temp:  [97.1 F (36.2 C)-98.4 F (36.9 C)] 98.4 F (36.9 C) (08/04 0502) Pulse Rate:  [77-98] 83 (08/04 0502) Resp:  [16-18] 18 (08/04 0502) BP: (98-116)/(65-75) 104/68 mmHg (08/04 0502) SpO2:  [98 %-100 %] 98 % (08/04 0502) Weight:  [100 lb 11.2 oz (45.677 kg)-101 lb 3.2 oz (45.904 kg)] 100 lb 11.2 oz (45.677 kg) (08/04 0502) Weight change:  Last BM Date: 01/31/14  Intake/Output from previous day: 08/03 0701 - 08/04 0700 In: 273.8 [I.V.:273.8] Out: 200 [Urine:200]  Mouth: mucous membranes moist, pharynx normal without lesions Resp: clear to auscultation bilaterally Cardio: regular rate and rhythm, S1, S2 normal, no murmur, click, rub or gallop GI: soft, non-tender; bowel sounds normal; no masses,  no organomegaly Extremities: extremities normal, atraumatic, no cyanosis or edema  Portacath without erythema  Lab Results:  Recent Labs  01/31/14 1825 02/01/14 0412  WBC 1.5* 1.3*  HGB 9.2* 8.7*  HCT 26.8* 25.8*  PLT 105* 105*    BMET  Recent Labs  01/31/14 1825 02/01/14 0412  NA 137 137  K 4.4 4.3  CL 102 104  CO2 23 22  GLUCOSE 178* 85  BUN 51* 46*  CREATININE 1.65* 1.49*  CALCIUM 8.9 8.7    Studies/Results: Portable Chest 1 View  01/31/2014   CLINICAL DATA:  Cough and chest congestion.  EXAM: PORTABLE CHEST - 1 VIEW  COMPARISON:  04/25/2010.  FINDINGS: Normal sized heart with a marked interval decrease in size. Clear lungs. The lungs remain hyperexpanded. Right jugular port catheter tip in the superior vena  cava. Mild scoliosis.  IMPRESSION: No acute abnormality.  Stable cardiomegaly.   Electronically Signed   By: Enrique Sack M.D.   On: 01/31/2014 18:57    Medications: I have reviewed the patient's current medications.  Assessment/Plan:  63 year old gentleman with the following issues:  1. 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 He is currently receiving radiation therapy concomitantly with carboplatin and Taxol. Chemotherapy will be held this week given his counts and neutropenia. Dr. Isidore Moos will decide whether or she was to continue with radiation or give him a break this week.  2. Dehydration and failure to thrive: Dr. Redmond Pulling from Gen. surgery have been notified with the patient's admission. He will currently place a feeding tube in the near future. Having the patient in the hospital may be an ideal time to do that given his poor social support situation.  3. Neutropenia: Related to chemotherapy. See no evidence of any fever or sepsis at this point we'll continue to monitor him. Chemotherapy will be on hold this week and will be resumed next weeks at a lower dose.  4. Increased BUN and creatinine: Related to prerenal azotemia and is responding to hydration.   LOS: 1 day   Bristol Soy 02/01/2014, 8:08 AM

## 2014-02-01 NOTE — Evaluation (Signed)
Occupational Therapy Evaluation Patient Details Name: Michael Keith MRN: [id number] DOB: 04-03-1951 Today's Date: 02/01/2014    History of Present Illness 63 yoAfrican American gentleman with history of chronic pancreatitis, nonischemic cardiomyopathy, chronic systolic CHF, history of tobacco abuse, COPD, coronary artery disease, squamous cell esophageal cancer undergoing chemotherapy and radiation who was receiving radiation therapy on 01/31/14 when it was noted that  his pulse was irregular and could not be obtained , pt was noted to be  dehydrated with systolic blood pressures in the 90s   Clinical Impression   Pt is a 63 year old man who was admitted from XRT when pulse could not be obtained.  He is undergoing concurrent chemo and XRT for esophageal CA.  PMH outlined above.  Pt was mostly functioning at an independent level at home. He has a cane and walker which he was not using.  Pt is min guard overall at this time due to unsteadiness.  Will follow in acute with supervision level goals.      Follow Up Recommendations  No OT follow up    Equipment Recommendations  None recommended by OT    Recommendations for Other Services       Precautions / Restrictions Precautions Precautions: Fall Restrictions Weight Bearing Restrictions: No      Mobility Bed Mobility Overal bed mobility: Needs Assistance Bed Mobility: Supine to Sit;Sit to Supine     Supine to sit: Supervision Sit to supine: Supervision   General bed mobility comments: no assist required  Transfers Overall transfer level: Needs assistance   Transfers: Sit to/from Stand Sit to Stand: Supervision         General transfer comment: mildly unsteady but no LOB    Balance                                            ADL Overall ADL's : Needs assistance/impaired                         Toilet Transfer: Min guard;Ambulation             General ADL Comments: min guard to get  clothes/supplies and for sit to stand for adls   Reviewed energy conservation.  Pt states that he does pace himself and takes rest breaks     Vision                     Perception     Praxis      Pertinent Vitals/Pain Throat sore     Hand Dominance     Extremity/Trunk Assessment Upper Extremity Assessment Upper Extremity Assessment: Overall WFL for tasks assessed (strength grossly 4/5)   Lower Extremity Assessment Lower Extremity Assessment:  (c/o numbness in feet)   Cervical / Trunk Assessment Cervical / Trunk Assessment: Normal   Communication Communication Communication: No difficulties   Cognition Arousal/Alertness: Awake/alert Behavior During Therapy: WFL for tasks assessed/performed Overall Cognitive Status: Within Functional Limits for tasks assessed                     General Comments       Exercises       Shoulder Instructions      Home Living Family/patient expects to be discharged to:: Private residence Living Arrangements: Alone Available Help at Discharge: Friend(s);Neighbor;Available PRN/intermittently Type of  Home: House Home Access: Stairs to enter [employer] of Steps: 7 Entrance Stairs-Rails: Right;Left Home Layout: Multi-level Alternate Level Stairs-Number of Steps: 7 steps between levels with rails Alternate Level Stairs-Rails: Right;Left Bathroom Shower/Tub: Tub/shower unit Shower/tub characteristics: Architectural technologist: Standard     Home Equipment: Building services engineer Comments: pt has been sponge bathing per MD.  Feet tend to swell and are prone to edema      Prior Functioning/Environment          Comments: friends and family provide transportation and help with outside work.  Pt has ensure, liquids.  Looking at tube placement while an inpatient    OT Diagnosis: Generalized weakness   OT Problem List: Decreased strength;Decreased activity tolerance;Impaired balance (sitting and/or  standing);Impaired sensation   OT Treatment/Interventions: Self-care/ADL training;Therapeutic exercise;Patient/family education;Balance training    OT Goals(Current goals can be found in the care plan section) Acute Rehab OT Goals Patient Stated Goal: to go home OT Goal Formulation: With patient Time For Goal Achievement: 02/15/14 Potential to Achieve Goals: Good ADL Goals Pt Will Perform Grooming: with supervision;standing Pt Will Transfer to Toilet: with supervision;regular height toilet;ambulating Pt/caregiver will Perform Home Exercise Program: Increased strength;Right Upper extremity;Left upper extremity;With Supervision;With theraband;With written HEP provided Additional ADL Goal #1: pt will gather clothes and complete adls at supervision level  OT Frequency: Min 2X/week   Barriers to D/C:            Co-evaluation              End of Session    Activity Tolerance: Patient tolerated treatment well Patient left: in bed;with call bell/phone within reach   Time: 1331-1345 OT Time Calculation (min): 14 min Charges:  OT General Charges $OT Visit: 1 Procedure OT Evaluation $Initial OT Evaluation Tier I: 1 Procedure OT Treatments $Self Care/Home Management : 8-22 mins G-Codes:    Shinika Estelle 02-10-14, 2:33 PM Lesle Chris, OTR/L [phone] 02/10/14

## 2014-02-02 ENCOUNTER — Encounter (HOSPITAL_COMMUNITY): Payer: Self-pay | Admitting: Certified Registered Nurse Anesthetist

## 2014-02-02 ENCOUNTER — Ambulatory Visit: Payer: Self-pay | Admitting: Physician Assistant

## 2014-02-02 ENCOUNTER — Inpatient Hospital Stay (HOSPITAL_COMMUNITY): Payer: Medicare Other | Admitting: Certified Registered Nurse Anesthetist

## 2014-02-02 ENCOUNTER — Encounter (HOSPITAL_COMMUNITY)
Admission: AD | Disposition: A | Discharge status: Skilled Nursing Facility | Payer: Medicare Other | Source: Ambulatory Visit | Attending: Internal Medicine

## 2014-02-02 ENCOUNTER — Encounter: Payer: Self-pay | Admitting: *Deleted

## 2014-02-02 ENCOUNTER — Encounter (HOSPITAL_COMMUNITY): Payer: Medicare Other | Admitting: Certified Registered Nurse Anesthetist

## 2014-02-02 ENCOUNTER — Other Ambulatory Visit: Payer: Self-pay

## 2014-02-02 ENCOUNTER — Ambulatory Visit: Payer: Medicare Other

## 2014-02-02 DIAGNOSIS — I059 Rheumatic mitral valve disease, unspecified: Secondary | ICD-10-CM

## 2014-02-02 DIAGNOSIS — Z789 Other specified health status: Secondary | ICD-10-CM

## 2014-02-02 HISTORY — PX: LAPAROSCOPIC GASTROSTOMY: SHX5896

## 2014-02-02 LAB — URINE CULTURE

## 2014-02-02 LAB — CBC
HCT: 26.5 % — ABNORMAL LOW (ref 39.0–52.0)
HEMATOCRIT: 24.6 % — AB (ref 39.0–52.0)
HEMOGLOBIN: 8.3 g/dL — AB (ref 13.0–17.0)
HEMOGLOBIN: 8.8 g/dL — AB (ref 13.0–17.0)
MCH: 29.1 pg (ref 26.0–34.0)
MCH: 29.1 pg (ref 26.0–34.0)
MCHC: 33.7 g/dL (ref 30.0–36.0)
MCHC: 33.2 g/dL (ref 30.0–36.0)
MCV: 86.3 fL (ref 78.0–100.0)
MCV: 87.7 fL (ref 78.0–100.0)
PLATELETS: 100 10*3/uL — AB (ref 150–400)
Platelets: 108 10*3/uL — ABNORMAL LOW (ref 150–400)
RBC: 2.85 MIL/uL — ABNORMAL LOW (ref 4.22–5.81)
RBC: 3.02 MIL/uL — AB (ref 4.22–5.81)
RDW: 12.8 % (ref 11.5–15.5)
RDW: 12.9 % (ref 11.5–15.5)
WBC: 1.2 10*3/uL — CL (ref 4.0–10.5)
WBC: 1.6 10*3/uL — ABNORMAL LOW (ref 4.0–10.5)

## 2014-02-02 LAB — PROTIME-INR
INR: 1.13 (ref 0.00–1.49)
Prothrombin Time: 14.5 seconds (ref 11.6–15.2)

## 2014-02-02 LAB — BASIC METABOLIC PANEL
Anion gap: 13 (ref 5–15)
BUN: 27 mg/dL — AB (ref 6–23)
CALCIUM: 8.4 mg/dL (ref 8.4–10.5)
CO2: 19 meq/L (ref 19–32)
Chloride: 111 mEq/L (ref 96–112)
Creatinine, Ser: 1.18 mg/dL (ref 0.50–1.35)
GFR calc Af Amer: 74 mL/min — ABNORMAL LOW (ref >90)
GFR, EST NON AFRICAN AMERICAN: 63 mL/min — AB (ref >90)
GLUCOSE: 72 mg/dL (ref 70–99)
Potassium: 4.1 mEq/L (ref 3.7–5.3)
Sodium: 143 mEq/L (ref 137–147)

## 2014-02-02 LAB — ABO/RH: ABO/RH(D): A POS

## 2014-02-02 LAB — CREATININE, SERUM
Creatinine, Ser: 1.18 mg/dL (ref 0.50–1.35)
GFR calc Af Amer: 74 mL/min — ABNORMAL LOW (ref >90)
GFR calc non Af Amer: 63 mL/min — ABNORMAL LOW (ref >90)

## 2014-02-02 LAB — GLUCOSE, CAPILLARY: Glucose-Capillary: 77 mg/dL (ref 70–99)

## 2014-02-02 LAB — SURGICAL PCR SCREEN
MRSA, PCR: INVALID — AB
Staphylococcus aureus: INVALID — AB

## 2014-02-02 LAB — APTT: aPTT: 49 seconds — ABNORMAL HIGH (ref 24–37)

## 2014-02-02 SURGERY — CREATION, GASTROSTOMY, LAPAROSCOPIC
Anesthesia: General | Site: Abdomen

## 2014-02-02 MED ORDER — CEFAZOLIN SODIUM-DEXTROSE 2-3 GM-% IV SOLR
INTRAVENOUS | Status: AC
  Filled 2014-02-02: qty 50

## 2014-02-02 MED ORDER — ONDANSETRON HCL 4 MG/2ML IJ SOLN
INTRAMUSCULAR | Status: DC | PRN
  Administered 2014-02-02: 4 mg via INTRAVENOUS

## 2014-02-02 MED ORDER — DEXAMETHASONE SODIUM PHOSPHATE 10 MG/ML IJ SOLN
INTRAMUSCULAR | Status: DC | PRN
  Administered 2014-02-02: 5 mg via INTRAVENOUS

## 2014-02-02 MED ORDER — SODIUM CHLORIDE 0.9 % IJ SOLN
10.0000 mL | INTRAMUSCULAR | Status: DC | PRN
  Administered 2014-02-02: 10 mL

## 2014-02-02 MED ORDER — FENTANYL CITRATE 0.05 MG/ML IJ SOLN: 25.0000 ug | INTRAMUSCULAR | Status: DC | PRN

## 2014-02-02 MED ORDER — ETOMIDATE 2 MG/ML IV SOLN
INTRAVENOUS | Status: AC
  Filled 2014-02-02: qty 10

## 2014-02-02 MED ORDER — SUCCINYLCHOLINE CHLORIDE 20 MG/ML IJ SOLN
INTRAMUSCULAR | Status: DC | PRN
  Administered 2014-02-02: 80 mg via INTRAVENOUS

## 2014-02-02 MED ORDER — BUPIVACAINE LIPOSOME 1.3 % IJ SUSP
20.0000 mL | Freq: Once | INTRAMUSCULAR | Status: DC
Start: 2014-02-02 — End: 2014-02-02
  Filled 2014-02-02: qty 20

## 2014-02-02 MED ORDER — CEFAZOLIN SODIUM-DEXTROSE 2-3 GM-% IV SOLR
2.0000 g | INTRAVENOUS | Status: AC
  Administered 2014-02-02: 2 g via INTRAVENOUS

## 2014-02-02 MED ORDER — PROPOFOL 10 MG/ML IV BOLUS
INTRAVENOUS | Status: DC | PRN
  Administered 2014-02-02: 50 mg via INTRAVENOUS

## 2014-02-02 MED ORDER — ONDANSETRON HCL 4 MG/2ML IJ SOLN
INTRAMUSCULAR | Status: AC
  Filled 2014-02-02: qty 2

## 2014-02-02 MED ORDER — NEOSTIGMINE METHYLSULFATE 10 MG/10ML IV SOLN
INTRAVENOUS | Status: AC
  Filled 2014-02-02: qty 1

## 2014-02-02 MED ORDER — ENOXAPARIN SODIUM 30 MG/0.3ML ~~LOC~~ SOLN
30.0000 mg | SUBCUTANEOUS | Status: DC
  Filled 2014-02-02: qty 0.3

## 2014-02-02 MED ORDER — PHENYLEPHRINE HCL 10 MG/ML IJ SOLN
INTRAMUSCULAR | Status: DC | PRN
  Administered 2014-02-02 (×3): 80 ug via INTRAVENOUS

## 2014-02-02 MED ORDER — ESMOLOL HCL 10 MG/ML IV SOLN
INTRAVENOUS | Status: DC | PRN
  Administered 2014-02-02: 20 mg via INTRAVENOUS

## 2014-02-02 MED ORDER — SODIUM CHLORIDE 0.9 % IJ SOLN
INTRAMUSCULAR | Status: AC
  Filled 2014-02-02: qty 10

## 2014-02-02 MED ORDER — EPHEDRINE SULFATE 50 MG/ML IJ SOLN
INTRAMUSCULAR | Status: AC
  Filled 2014-02-02: qty 1

## 2014-02-02 MED ORDER — GLYCOPYRROLATE 0.2 MG/ML IJ SOLN
INTRAMUSCULAR | Status: DC | PRN
  Administered 2014-02-02: 0.4 mg via INTRAVENOUS

## 2014-02-02 MED ORDER — KETOROLAC TROMETHAMINE 30 MG/ML IJ SOLN: 15.0000 mg | Freq: Once | INTRAMUSCULAR | Status: DC | PRN

## 2014-02-02 MED ORDER — HYDROMORPHONE HCL PF 1 MG/ML IJ SOLN
0.5000 mg | INTRAMUSCULAR | Status: DC | PRN
  Administered 2014-02-04 – 2014-02-05 (×2): 0.5 mg via INTRAVENOUS
  Filled 2014-02-02 (×2): qty 1

## 2014-02-02 MED ORDER — PHENYLEPHRINE HCL 10 MG/ML IJ SOLN
INTRAMUSCULAR | Status: AC
  Filled 2014-02-02: qty 1

## 2014-02-02 MED ORDER — BUPIVACAINE-EPINEPHRINE (PF) 0.25% -1:200000 IJ SOLN
INTRAMUSCULAR | Status: AC
  Filled 2014-02-02: qty 30

## 2014-02-02 MED ORDER — GLYCOPYRROLATE 0.2 MG/ML IJ SOLN
INTRAMUSCULAR | Status: AC
  Filled 2014-02-02: qty 2

## 2014-02-02 MED ORDER — ONDANSETRON HCL 4 MG/2ML IJ SOLN: 4.0000 mg | Freq: Four times a day (QID) | INTRAMUSCULAR | Status: DC | PRN

## 2014-02-02 MED ORDER — ONDANSETRON HCL 4 MG PO TABS
4.0000 mg | ORAL_TABLET | Freq: Four times a day (QID) | ORAL | Status: DC | PRN
  Filled 2014-02-02: qty 1

## 2014-02-02 MED ORDER — FENTANYL CITRATE 0.05 MG/ML IJ SOLN
INTRAMUSCULAR | Status: DC | PRN
  Administered 2014-02-02 (×4): 50 ug via INTRAVENOUS

## 2014-02-02 MED ORDER — ROCURONIUM BROMIDE 100 MG/10ML IV SOLN
INTRAVENOUS | Status: DC | PRN
  Administered 2014-02-02: 20 mg via INTRAVENOUS

## 2014-02-02 MED ORDER — LIDOCAINE HCL (CARDIAC) 20 MG/ML IV SOLN
INTRAVENOUS | Status: DC | PRN
  Administered 2014-02-02: 40 mg via INTRAVENOUS

## 2014-02-02 MED ORDER — LIDOCAINE HCL (CARDIAC) 20 MG/ML IV SOLN
INTRAVENOUS | Status: AC
  Filled 2014-02-02: qty 5

## 2014-02-02 MED ORDER — ESMOLOL HCL 10 MG/ML IV SOLN
INTRAVENOUS | Status: AC
  Filled 2014-02-02: qty 10

## 2014-02-02 MED ORDER — NEOSTIGMINE METHYLSULFATE 10 MG/10ML IV SOLN
INTRAVENOUS | Status: DC | PRN
  Administered 2014-02-02: 2.5 mg via INTRAVENOUS

## 2014-02-02 MED ORDER — DEXAMETHASONE SODIUM PHOSPHATE 10 MG/ML IJ SOLN
INTRAMUSCULAR | Status: AC
  Filled 2014-02-02: qty 1

## 2014-02-02 MED ORDER — SODIUM CHLORIDE 0.9 % IV SOLN
10.0000 mg | INTRAVENOUS | Status: DC | PRN
  Administered 2014-02-02: 25 ug/min via INTRAVENOUS

## 2014-02-02 MED ORDER — LACTATED RINGERS IV SOLN
INTRAVENOUS | Status: DC
  Administered 2014-02-02: 1000 mL via INTRAVENOUS

## 2014-02-02 MED ORDER — FENTANYL CITRATE 0.05 MG/ML IJ SOLN
INTRAMUSCULAR | Status: AC
  Filled 2014-02-02: qty 5

## 2014-02-02 MED ORDER — SODIUM CHLORIDE 0.9 % IJ SOLN: 10.0000 mL | Freq: Two times a day (BID) | INTRAMUSCULAR | Status: DC

## 2014-02-02 MED ORDER — BUPIVACAINE-EPINEPHRINE 0.25% -1:200000 IJ SOLN
INTRAMUSCULAR | Status: DC | PRN
  Administered 2014-02-02: 25 mL

## 2014-02-02 MED ORDER — PROMETHAZINE HCL 25 MG/ML IJ SOLN: 6.2500 mg | INTRAMUSCULAR | Status: DC | PRN

## 2014-02-02 MED ORDER — HEPARIN SODIUM (PORCINE) 5000 UNIT/ML IJ SOLN
5000.0000 [IU] | Freq: Three times a day (TID) | INTRAMUSCULAR | Status: DC
  Administered 2014-02-02 – 2014-02-07 (×15): 5000 [IU] via SUBCUTANEOUS
  Filled 2014-02-02 (×18): qty 1

## 2014-02-02 SURGICAL SUPPLY — 38 items
APL SKNCLS STERI-STRIP NONHPOA (GAUZE/BANDAGES/DRESSINGS) ×1
BENZOIN TINCTURE PRP APPL 2/3 (GAUZE/BANDAGES/DRESSINGS) ×3 IMPLANT
CATH GASTROSTOMY 24FR (CATHETERS) ×3 IMPLANT
CHLORAPREP W/TINT 26ML (MISCELLANEOUS) ×3 IMPLANT
DERMABOND ADVANCED (GAUZE/BANDAGES/DRESSINGS) ×2
DERMABOND ADVANCED .7 DNX12 (GAUZE/BANDAGES/DRESSINGS) ×1 IMPLANT
DEVICE SUT QUICK LOAD TK 5 (STAPLE) IMPLANT
DEVICE SUT TI-KNOT TK 5X26 (MISCELLANEOUS) IMPLANT
DEVICE SUTURE ENDOST 10MM (ENDOMECHANICALS) ×3 IMPLANT
DEVICE TI KNOT TK5 (MISCELLANEOUS)
DEVICE TROCAR PUNCTURE CLOSURE (ENDOMECHANICALS) IMPLANT
DRAPE LAPAROSCOPIC ABDOMINAL (DRAPES) ×3 IMPLANT
ELECT REM PT RETURN 9FT ADLT (ELECTROSURGICAL) ×3
ELECTRODE REM PT RTRN 9FT ADLT (ELECTROSURGICAL) ×1 IMPLANT
GAUZE SPONGE 2X2 8PLY STRL LF (GAUZE/BANDAGES/DRESSINGS) IMPLANT
GAUZE SPONGE 4X4 12PLY STRL (GAUZE/BANDAGES/DRESSINGS) IMPLANT
GOWN STRL REUS W/TWL XL LVL3 (GOWN DISPOSABLE) ×6 IMPLANT
KIT BASIN OR (CUSTOM PROCEDURE TRAY) ×3 IMPLANT
QUICK LOAD TK 5 (STAPLE)
RELOAD ENDO STITCH 2.0 (ENDOMECHANICALS) ×6
SCISSORS LAP 5X35 DISP (ENDOMECHANICALS) IMPLANT
SET IRRIG TUBING LAPAROSCOPIC (IRRIGATION / IRRIGATOR) IMPLANT
SHEATH PEELAWAY 24FR (VASCULAR PRODUCTS) ×3 IMPLANT
SLEEVE XCEL OPT CAN 5 100 (ENDOMECHANICALS) ×9 IMPLANT
SPONGE DRAIN TRACH 4X4 STRL 2S (GAUZE/BANDAGES/DRESSINGS) ×3 IMPLANT
SPONGE GAUZE 2X2 STER 10/PKG (GAUZE/BANDAGES/DRESSINGS)
SUT ETHILON 2 0 PS N (SUTURE) ×3 IMPLANT
SUT MNCRL AB 4-0 PS2 18 (SUTURE) ×3 IMPLANT
SUT PROLENE 2 0 SH DA (SUTURE) IMPLANT
SUT RELOAD ENDO STITCH 2.0 (ENDOMECHANICALS) ×2
SUT SILK 2 0 SH (SUTURE) ×3 IMPLANT
SUTURE RELOAD ENDO STITCH 2.0 (ENDOMECHANICALS) ×2 IMPLANT
TOWEL OR 17X26 10 PK STRL BLUE (TOWEL DISPOSABLE) ×6 IMPLANT
TOWEL OR NON WOVEN STRL DISP B (DISPOSABLE) ×3 IMPLANT
TRAY LAP CHOLE (CUSTOM PROCEDURE TRAY) ×3 IMPLANT
TROCAR BLADELESS OPT 5 100 (ENDOMECHANICALS) ×9 IMPLANT
TROCAR XCEL NON-BLD 11X100MML (ENDOMECHANICALS) ×3 IMPLANT
TUBING INSUFFLATION 10FT LAP (TUBING) ×3 IMPLANT

## 2014-02-02 NOTE — Progress Notes (Signed)
To provide support, encouragement and care continuity, met with patient during his weekly UT with Dr. Isidore Moos.  Assisted in explaning her recommendation for hospital admission to address his dehydration. Assured him I would communicate with [facility] re: his travel appts.  [name], RN, BSN, [facility] Head [facility] Neck Oncology Navigator [phone]

## 2014-02-02 NOTE — Progress Notes (Signed)
To provide support and encouragement, visited pt in [place] briefly prior to PEG placement and at greater length after.  During post-placement visit, I addressed his question re: not being able to [place].  I talked with his RN who was not aware of when he would be transitioned to oral intake.  I checked his diet order of 'NPO with ice chips only', provided him ice chips which he appreciated.  I will continue to follow Mr. Pulice during this admission.  [name], RN, BSN, [facility] Head [facility] Neck Oncology Navigator [phone]

## 2014-02-02 NOTE — Progress Notes (Signed)
Patient has only voided 25 ml since surgery.  Bladder scan shows greater than 600 ml in bladder.  Patient unable to void.  Dr. Hartford Poli notified.

## 2014-02-02 NOTE — H&P (View-Only) (Signed)
Patient ID: Michael Keith, male   DOB: [birth year], 63 y.o.   MRN: [id number]  Chief Complaint  Patient presents with  . eval for peg tube    HPI Michael Keith is a 63 y.o. male.  HPI 63 year old African American male referred by Dr. Alen Blew for evaluation of a gastric feeding tube. The patient had had some trouble eating and was ultimately found to have a mass in the upper third of his esophagus that was found to be cancer. He is undergoing chemotherapy and radiation. He reportedly was found not to be an operative candidate. He has had ongoing difficulty with eating foods and a feeding tube has been requested. He denies any difficulty with liquids as long as he does not drink too fast. He reports that solid food tends to get hung up. He reports some insomnia. He denies any prior abdominal surgery. He denies any current alcohol or tobacco use.  Past Medical History  Diagnosis Date  . Chronic pancreatitis     CT scan shows improving pancreatitis  . Nonischemic cardiomyopathy 09/2009    Catheterization, April, 2011, questionable occlusion of the most apical portion of the LAD, LV dysfunction out of proportion to coronary disease  . Chronic systolic CHF (congestive heart failure)     April, 2011 mild troponin elevation at that time  . Mitral regurgitation     mild, echo April 2011  . Tobacco abuse   . Alcohol use     heavy until Jan 2011, patient was counseled concerning alcohol in April 2011  . COPD (chronic obstructive pulmonary disease)   . Fluid overload 03/2010    October, 2011  . Cough Jan 2012    may be from enalapril, January, 2012  . Ejection fraction < 50%     EF 35-40%, echo, April, 2011  . CAD (coronary artery disease)     Catheterization, April, 2011, questionable occlusion of the most apical portion of the LAD, LV dysfunction out of proportion to coronary disease  . Squamous cell esophageal cancer     Past Surgical History  Procedure Laterality Date  . Cardiac  catheterization  April 2011    questionable occlusion of the most apical portion of the LAD / LV dysfunction out of proportion to coronary disease  . Wrist surgey      left wrist    Family History  Problem Relation Age of Onset  . Diabetes Cousin   . Cancer Mother     Social History History  Substance Use Topics  . Smoking status: Former Smoker -- 0.50 packs/day for 30 years    Types: Cigarettes    Quit date: 01/19/2014  . Smokeless tobacco: Never Used     Comment: smoking 3 cigarettes per day  . Alcohol Use: Yes     Comment: heavy untill Jan 2011    No Known Allergies  Current Outpatient Prescriptions  Medication Sig Dispense Refill  . aspirin 81 MG tablet Take 81 mg by mouth daily.        Marland Kitchen emollient (BIAFINE) cream Apply topically as needed.      . furosemide (LASIX) 40 MG tablet Take 40 mg by mouth every morning.      . lidocaine-prilocaine (EMLA) cream Apply 1 application topically as needed.  30 g  0  . Multiple Vitamin (MULTIVITAMIN WITH MINERALS) TABS tablet Take 1 tablet by mouth daily.      . nitroGLYCERIN (NITROSTAT) 0.4 MG SL tablet Place 0.4 mg under the tongue every  5 (five) minutes as needed.        . Thiamine HCl (VITAMIN B-1) 100 MG tablet Take 100 mg by mouth daily.        Marland Kitchen HYDROCODONE-ACETAMINOPHEN PO Take 15 mLs by mouth 4 (four) times daily as needed.      . prochlorperazine (COMPAZINE) 10 MG tablet Take 10 mg by mouth every 6 (six) hours as needed for nausea or vomiting.       No current facility-administered medications for this visit.    Review of Systems Review of Systems  Constitutional: Negative for fever, chills, appetite change and unexpected weight change.  HENT: Negative for congestion and trouble swallowing.   Eyes: Negative for visual disturbance.  Respiratory: Negative for chest tightness and shortness of breath.   Cardiovascular: Positive for leg swelling (around ankles). Negative for chest pain.       No PND, no orthopnea, no DOE   Gastrointestinal:       See HPI  Genitourinary: Negative for dysuria and hematuria.  Musculoskeletal: Negative.   Skin: Negative for rash.  Neurological: Negative for seizures and speech difficulty.  Hematological: Does not bruise/bleed easily.  Psychiatric/Behavioral: Negative for behavioral problems and confusion.    Blood pressure 124/86, pulse 73, temperature 97.5 F (36.4 C), height 5\' 6"  (1.676 m), weight 109 lb (49.442 kg).  Physical Exam Physical Exam  Vitals reviewed. Constitutional: He is oriented to person, place, and time. He appears well-developed. He appears cachectic. No distress.  HENT:  Head: Normocephalic and atraumatic.  Right Ear: External ear normal.  Left Ear: External ear normal.  Eyes: Conjunctivae are normal. No scleral icterus.  Muddy sclera  Neck: Normal range of motion. Neck supple. No tracheal deviation present. No thyromegaly present.  Cardiovascular: Normal rate, normal heart sounds and intact distal pulses.   Pulmonary/Chest: Effort normal and breath sounds normal. No respiratory distress. He has no wheezes.  Abdominal: Soft. He exhibits no distension. There is no tenderness. There is no rebound and no guarding.  Small abdomen  Musculoskeletal: Normal range of motion. He exhibits no edema and no tenderness.  Lymphadenopathy:    He has no cervical adenopathy.  Neurological: He is alert and oriented to person, place, and time. He exhibits normal muscle tone.  Skin: Skin is warm and dry. No rash noted. He is not diaphoretic. No erythema. No pallor.  Psychiatric: He has a normal mood and affect. His behavior is normal.    Data Reviewed Dr Hazeline Junker notes Dr Ron Parker note  Assessment    Cancer of the upper third esophagus Protein calorie malnutrition     Plan    I discussed his case with his medical oncologist. He does not believe he will ever be a surgical candidate with respect to esophagectomy therefore he believes a gastric feeding tube  would be okay. Therefore we discussed a laparoscopic-assisted G-tube placement. We also discussed the possibility of an open gastrotomy tube placement. I drew diagrams for the patient. He was accompanied by a friend. We discussed the risks and benefits of surgery including but not limited to bleeding, infection, injury to surrounding structures, need to convert to an upper procedure, blood clot formation, perioperative cardiac and pulmonary issues. We also discussed long-term feeding tube issues with respect to his skin issues, drainage around the tube, et Ronney Asters.  Because of his significant cardiac history I believe he needs to have cardiac clearance prior to surgery. If he is felt to be a high risk operative candidate and that he  may just have to undergo a a percutaneous feeding tube placement despite oncologic risk  I explained to patient that he would be scheduled for surgery once we obtain clearance or information back from his cardiologist.  Leighton Ruff. Redmond Pulling, MD, FACS General, Bariatric, & Minimally Invasive Surgery [facility], [place]         [facility] M 01/27/2014, 9:35 AM

## 2014-02-02 NOTE — Progress Notes (Signed)
[facility] PROGRESS NOTE  MANDEEP KISER YPP:[id number] DOB: [birth year] DOA: 01/31/2014 PCP: Philis Fendt, MD  Assessment/Plan:  Dehydration/orthostasis: due to poor PO intake -Continue IVF's -continue holding diuretics and antihypertensive agents -close follow up and reassessment of volume status -G-tube placed today laparoscopically by Dr. Hassell Done.  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 -Per oncology, no evidence of fever, and she still >500, observe.  Chronic systolyc heart failure and CAD: compensated. No CP or SOB. -holding diuretics and ASA -continue daily weights -Strict I's and O's  COPD: compensated: continue PRN albuterol  Tobacco abuse: cessation counseling provided -started on nicotine patch  Severe protein calorie malnutrition: continue feeding supplements -CCS evaluating for feeding tube placement and hopefully better nutrition  DVT: lovenox   Code Status: Full Family Communication: no family at bedside Disposition Plan: to be determine   Consultants:  Oncology   CCS  Procedures:  None   Antibiotics:  None   HPI/Subjective: Feeling slightly better; no nausea or vomiting. Complaining of throat pain and poor appetite   Objective: Filed Vitals:   02/02/14 1349  BP: 106/61  Pulse: 76  Temp:   Resp: 14    Intake/Output Summary (Last 24 hours) at 02/02/14 1616 Last data filed at 02/02/14 1340  Gross per 24 hour  Intake   1860 ml  Output    830 ml  Net   1030 ml   Filed Weights   01/31/14 1725 02/01/14 0502 02/02/14 0543  Weight: 45.904 kg (101 lb 3.2 oz) 45.677 kg (100 lb 11.2 oz) 46.63 kg (102 lb 12.8 oz)    Exam:   General:  Frail, cachetic and no  severe distress, afebrile and complaining of throat/upper mid chest discomfort  Cardiovascular: S1 and S2, regular rate; no rubs or gallops; no JVD  Respiratory: good air movement, no crackles or wheezing  Abdomen: soft, NT, ND, positive BS  Musculoskeletal: no cyanosis or edema  Data Reviewed: Basic Metabolic Panel:  Recent Labs Lab 01/31/14 1825 02/01/14 0412 02/02/14 0400  NA 137 137 143  K 4.4 4.3 4.1  CL 102 104 111  CO2 23 22 19   GLUCOSE 178* 85 72  BUN 51* 46* 27*  CREATININE 1.65* 1.49* 1.18  CALCIUM 8.9 8.7 8.4  MG 2.4  --   --   PHOS 3.8  --   --    Liver Function Tests:  Recent Labs Lab 01/31/14 1825  AST 22  ALT 10  ALKPHOS 58  BILITOT 0.3  PROT 7.9  ALBUMIN 3.5   CBC:  Recent Labs Lab 01/31/14 1825 02/01/14 0412 02/02/14 0400  WBC 1.5* 1.3* 1.2*  NEUTROABS 0.9*  --   --   HGB 9.2* 8.7* 8.3*  HCT 26.8* 25.8* 24.6*  MCV 85.6 85.4 86.3  PLT 105* 105* 108*   CBG:  Recent Labs Lab 02/01/14 0729 02/02/14 0743  GLUCAP 75 77    Recent Results (from the past 240 hour(s))  URINE CULTURE     Status: None   Collection Time    02/01/14  2:00 AM      Result Value Ref Range Status   Specimen Description URINE, RANDOM   Final   Special Requests NONE   Final   Culture  Setup Time     Final  Value: 02/01/2014 11:10     Performed at [facility]     Final   Value: 4,000 COLONIES/ML     Performed at [facility]   Culture     Final   Value: INSIGNIFICANT GROWTH     Performed at [facility]   Report Status 02/02/2014 FINAL   Final  SURGICAL PCR SCREEN     Status: Abnormal   Collection Time    02/02/14  8:50 AM      Result Value Ref Range Status   MRSA, PCR INVALID RESULTS, SPECIMEN SENT FOR CULTURE (*) NEGATIVE Final   Comment: NOTIFIED C. OBERRY RN AT 1105 ON 08.05.15 BY SHUEA   Staphylococcus aureus INVALID RESULTS, SPECIMEN SENT FOR CULTURE (*) NEGATIVE Final   Comment: NOTIFIED C. OBERRY RN AT  1062 ON 08.05.15 BY SHUEA                The Xpert SA Assay (FDA     approved for NASAL specimens     in patients over 64 years of age),     is one component of     a comprehensive surveillance     program.  Test performance has     been validated by [employer] for patients greater     than or equal to 50 year old.     It is not intended     to diagnose infection nor to     guide or monitor treatment.     Studies: Portable Chest 1 View  01/31/2014   CLINICAL DATA:  Cough and chest congestion.  EXAM: PORTABLE CHEST - 1 VIEW  COMPARISON:  04/25/2010.  FINDINGS: Normal sized heart with a marked interval decrease in size. Clear lungs. The lungs remain hyperexpanded. Right jugular port catheter tip in the superior vena cava. Mild scoliosis.  IMPRESSION: No acute abnormality.  Stable cardiomegaly.   Electronically Signed   By: Enrique Sack M.D.   On: 01/31/2014 18:57    Scheduled Meds: . docusate sodium  100 mg Oral BID  . feeding supplement (ENSURE COMPLETE)  237 mL Oral BID BM  . heparin  5,000 Units Subcutaneous 3 times per day  . lactose free nutrition  237 mL Oral TID WC  . multivitamin with minerals  1 tablet Oral Daily  . nicotine  14 mg Transdermal Daily  . sodium chloride  3 mL Intravenous Q12H  . sucralfate  1 g Oral TID WC & HS  . thiamine  100 mg Oral Daily   Continuous Infusions: . sodium chloride Stopped (02/02/14 1027)    Principal Problem:   Dehydration Active Problems:   Nonischemic cardiomyopathy   Tobacco abuse   COPD (chronic obstructive pulmonary disease)   CAD (coronary artery disease)   Chronic systolic CHF (congestive heart failure)   Dysphasia   Squamous cell esophageal cancer   Cancer of upper third of esophagus   Orthostasis   FTT (failure to thrive) in adult   Severe protein-calorie malnutrition    Time spent: >30 minutes    [place] [facility] Pager [phone]. If 7PM-7AM, please contact night-coverage at [email or web address],  password [facility] 02/02/2014, 4:16 PM  LOS: 2 days

## 2014-02-02 NOTE — Progress Notes (Signed)
CRITICAL VALUE ALERT  Critical value received:  WBC 1.2   Date of notification:  02/02/2014  Time of notification:  05:10  Critical value read back:Yes.    Nurse who received alert:  Rolene Arbour, RN  MD notified (1st page):  Raliegh Ip Schorr  Time of first page:  05:20  MD notified (2nd page):  Time of second page:  Responding MD:     Time MD responded:

## 2014-02-02 NOTE — Anesthesia Preprocedure Evaluation (Signed)
Anesthesia Evaluation  Patient identified by MRN, date of birth, ID band Patient awake    Reviewed: Allergy & Precautions, H&P , NPO status , Patient's Chart, lab work & pertinent test results  Airway Mallampati: II TM Distance: >3 FB Neck ROM: Full    Dental no notable dental hx.    Pulmonary Current Smoker,  breath sounds clear to auscultation  Pulmonary exam normal       Cardiovascular + CAD and +CHF + Valvular Problems/Murmurs MR Rhythm:Regular Rate:Normal + Systolic murmurs    Neuro/Psych negative neurological ROS  negative psych ROS   GI/Hepatic negative GI ROS, (+)     substance abuse  alcohol use,   Endo/Other  negative endocrine ROS  Renal/GU negative Renal ROS  negative genitourinary   Musculoskeletal negative musculoskeletal ROS (+)   Abdominal   Peds negative pediatric ROS (+)  Hematology  (+) anemia ,   Anesthesia Other Findings   Reproductive/Obstetrics negative OB ROS                           Anesthesia Physical Anesthesia Plan  ASA: IV  Anesthesia Plan: General   Post-op Pain Management:    Induction: Intravenous and Rapid sequence  Airway Management Planned: Oral ETT  Additional Equipment:   Intra-op Plan:   Post-operative Plan: Extubation in OR  Informed Consent: I have reviewed the patients History and Physical, chart, labs and discussed the procedure including the risks, benefits and alternatives for the proposed anesthesia with the patient or authorized representative who has indicated his/her understanding and acceptance.   Dental advisory given  Plan Discussed with: CRNA and Surgeon  Anesthesia Plan Comments:         Anesthesia Quick Evaluation

## 2014-02-02 NOTE — Op Note (Signed)
Surgeon: Kaylyn Lim, MD, FACS  Asst:  none  Anes:  general  Procedure: Laparoscopic gastrostomy tube placement  Diagnosis: Squamous cell carcinoma of the esophagus  Complications: none  EBL:   5 cc  Drains: none  Description of Procedure:  The patient was taken to OR 11 at [facility].  After anesthesia was administered and the patient was prepped a timeout was performed.  Access to the abdomen was achieved with a 5 mm Opitview without difficulty.  Following insufflation, the abdomen was surveyed and no obvious metastatic disease was noted.  The stomach was felt to be able to stretch to the anterior abdominal wall.  Two five mm and one 11 mm trocars were placed in the lower abdomen for the 5 mm scope and the EndoStitch.  A suitable site was selected on the stomach and a pursestring of 0 vicryl was placed.  The center was cauterized and the stomach entered.  A 24 introducer with peel away sheath was introduced but the 22 G tube would not pass.  Eventually, the 20 G tube was passed in to the stomach and the purse string secured.  The stomach was pulled up and a 0 vicryl was placed distal to the tube and retrieved with an Endoclose and tied thus securing the stomach to the anterior abdominal wall.  The abdomen was inspected and no bleeding was seen.  I looked in to the retrogastric space and everything looked good.  The tube was secured with 2-0 nylon to the skin and the other trocar sites were closed with 4-0 vicryl and Dermabond.    The patient tolerated the procedure well and was taken to the PACU in stable condition.     Matt B. Hassell Done, [place], [facility], [place]

## 2014-02-02 NOTE — Interval H&P Note (Signed)
History and Physical Interval Note:  02/02/2014 10:37 AM  Michael Keith  has presented today for surgery, with the diagnosis of esophogeal cancer dysphagia  The various methods of treatment have been discussed with the patient and family. After consideration of risks, benefits and other options for treatment, the patient has consented to  Procedure(s): [place] (N/A) as a surgical intervention .  The patient's history has been reviewed, patient examined, no change in status, stable for surgery.  I have reviewed the patient's chart and labs.  Questions were answered to the patient's satisfaction.  I have discussed this patient with Dr. Redmond Pulling and agreed to do him here at [facility] since he is already an inpatient.     Zayla Agar B

## 2014-02-02 NOTE — Anesthesia Postprocedure Evaluation (Signed)
  Anesthesia Post-op Note  Patient: Michael Keith  Procedure(s) Performed: Procedure(s) (LRB): LAPAROSCOPIC GASTROSTOMY TUBE PLACEMENT (N/A)  Patient Location: PACU  Anesthesia Type: General  Level of Consciousness: awake and alert   Airway and Oxygen Therapy: Patient Spontanous Breathing  Post-op Pain: mild  Post-op Assessment: Post-op Vital signs reviewed, Patient's Cardiovascular Status Stable, Respiratory Function Stable, Patent Airway and No signs of Nausea or vomiting  Last Vitals:  Filed Vitals:   02/02/14 1349  BP: 106/61  Pulse: 76  Temp:   Resp: 14    Post-op Vital Signs: stable   Complications: No apparent anesthesia complications

## 2014-02-02 NOTE — Progress Notes (Signed)
Patient ID: Michael Keith, male   DOB: 20-Mar-1951, 63 y.o.   MRN: [id number]     [address] [place], [place] [address]    Phone: [phone] FAX: [phone]     Subjective: Alert alert and awake.  NPO.  VSS.  Afebrile.  No cp, sob or palpitations.    Objective:  Vital signs:  Filed Vitals:   02/01/14 1348 02/01/14 2059 02/02/14 0451 02/02/14 0543  BP: 119/71 125/64 116/60   Pulse: 72 85 81   Temp: 97.7 F (36.5 C) 98.4 F (36.9 C) 98.3 F (36.8 C)   TempSrc: Oral Oral Oral   Resp: 16 16 16    Height:      Weight:    102 lb 12.8 oz (46.63 kg)  SpO2: 100% 100% 98%     Last BM Date: 01/31/14  Intake/Output   Yesterday:  08/04 0701 - 08/05 0700 In: 1920 [P.O.:120; I.V.:1800] Out: 1100 [Urine:1100] This shift:    I/O last 3 completed shifts: In: 2193.8 [P.O.:120; I.V.:2073.8] Out: 1300 [Urine:1300]    Physical Exam: General: Pt awake/alert/oriented x4 in no acute distress Psych:  No delerium/psychosis/paranoia Chest: cta.  No chest wall pain w good excursion CV:  Pulses intact.  Regular rhythm Abdomen: Soft.  Nondistended. Non tender.  No evidence of peritonitis.  No incarcerated hernias. Ext:  SCDs BLE.  No mjr edema.  No cyanosis Skin: No petechiae / purpura   Problem List:   Principal Problem:   Dehydration Active Problems:   Nonischemic cardiomyopathy   Tobacco abuse   COPD (chronic obstructive pulmonary disease)   CAD (coronary artery disease)   Chronic systolic CHF (congestive heart failure)   Dysphasia   Squamous cell esophageal cancer   Cancer of upper third of esophagus   Orthostasis   FTT (failure to thrive) in adult   Severe protein-calorie malnutrition    Results:   Labs: Results for orders placed during the hospital encounter of 01/31/14 (from the past 74 hour(s))  COMPREHENSIVE METABOLIC PANEL     Status: Abnormal   Collection Time    01/31/14  6:25 PM      Result  Value Ref Range   Sodium 137  137 - 147 mEq/L   Potassium 4.4  3.7 - 5.3 mEq/L   Chloride 102  96 - 112 mEq/L   CO2 23  19 - 32 mEq/L   Glucose, Bld 178 (*) 70 - 99 mg/dL   BUN 51 (*) 6 - 23 mg/dL   Creatinine, Ser 1.65 (*) 0.50 - 1.35 mg/dL   Calcium 8.9  8.4 - 10.5 mg/dL   Total Protein 7.9  6.0 - 8.3 g/dL   Albumin 3.5  3.5 - 5.2 g/dL   AST 22  0 - 37 U/L   ALT 10  0 - 53 U/L   Alkaline Phosphatase 58  39 - 117 U/L   Total Bilirubin 0.3  0.3 - 1.2 mg/dL   GFR calc non Af Amer 42 (*) >90 mL/min   GFR calc Af Amer 49 (*) >90 mL/min   Comment: (NOTE)     The eGFR has been calculated using the CKD EPI equation.     This calculation has not been validated in all clinical situations.     eGFR's persistently <90 mL/min signify possible Chronic Kidney     Disease.   Anion gap 12  5 - 15  MAGNESIUM  Status: None   Collection Time    01/31/14  6:25 PM      Result Value Ref Range   Magnesium 2.4  1.5 - 2.5 mg/dL  PHOSPHORUS     Status: None   Collection Time    01/31/14  6:25 PM      Result Value Ref Range   Phosphorus 3.8  2.3 - 4.6 mg/dL  CBC WITH DIFFERENTIAL     Status: Abnormal   Collection Time    01/31/14  6:25 PM      Result Value Ref Range   WBC 1.5 (*) 4.0 - 10.5 K/uL   RBC 3.13 (*) 4.22 - 5.81 MIL/uL   Hemoglobin 9.2 (*) 13.0 - 17.0 g/dL   HCT 26.8 (*) 39.0 - 52.0 %   MCV 85.6  78.0 - 100.0 fL   MCH 29.4  26.0 - 34.0 pg   MCHC 34.3  30.0 - 36.0 g/dL   RDW 12.7  11.5 - 15.5 %   Platelets 105 (*) 150 - 400 K/uL   Comment: REPEATED TO VERIFY     SPECIMEN CHECKED FOR CLOTS     PLATELET COUNT CONFIRMED BY SMEAR   Neutrophils Relative % 61  43 - 77 %   Lymphocytes Relative 20  12 - 46 %   Monocytes Relative 17 (*) 3 - 12 %   Eosinophils Relative 2  0 - 5 %   Basophils Relative 0  0 - 1 %   Neutro Abs 0.9 (*) 1.7 - 7.7 K/uL   Lymphs Abs 0.3 (*) 0.7 - 4.0 K/uL   Monocytes Absolute 0.3  0.1 - 1.0 K/uL   Eosinophils Absolute 0.0  0.0 - 0.7 K/uL   Basophils  Absolute 0.0  0.0 - 0.1 K/uL   Smear Review MORPHOLOGY UNREMARKABLE    APTT     Status: Abnormal   Collection Time    01/31/14  6:25 PM      Result Value Ref Range   aPTT 39 (*) 24 - 37 seconds   Comment:            IF BASELINE aPTT IS ELEVATED,     SUGGEST PATIENT RISK ASSESSMENT     BE USED TO DETERMINE APPROPRIATE     ANTICOAGULANT THERAPY.  PROTIME-INR     Status: None   Collection Time    01/31/14  6:25 PM      Result Value Ref Range   Prothrombin Time 14.2  11.6 - 15.2 seconds   INR 1.10  0.00 - 1.49  TSH     Status: None   Collection Time    01/31/14  6:27 PM      Result Value Ref Range   TSH 0.610  0.350 - 4.500 uIU/mL   Comment: Performed at [facility]     Status: None   Collection Time    01/31/14  7:00 PM      Result Value Ref Range   Prealbumin 23.2  17.0 - 34.0 mg/dL   Comment: Performed at [facility]     Status: None   Collection Time    02/01/14  2:00 AM      Result Value Ref Range   Color, Urine YELLOW  YELLOW   APPearance CLEAR  CLEAR   Specific Gravity, Urine 1.015  1.005 - 1.030   pH 5.5  5.0 - 8.0   Glucose, UA NEGATIVE  NEGATIVE mg/dL  Hgb urine dipstick NEGATIVE  NEGATIVE   Bilirubin Urine NEGATIVE  NEGATIVE   Ketones, ur NEGATIVE  NEGATIVE mg/dL   Protein, ur NEGATIVE  NEGATIVE mg/dL   Urobilinogen, UA 0.2  0.0 - 1.0 mg/dL   Nitrite NEGATIVE  NEGATIVE   Leukocytes, UA NEGATIVE  NEGATIVE   Comment: MICROSCOPIC NOT DONE ON URINES WITH NEGATIVE PROTEIN, BLOOD, LEUKOCYTES, NITRITE, OR GLUCOSE <1000 mg/dL.  BASIC METABOLIC PANEL     Status: Abnormal   Collection Time    02/01/14  4:12 AM      Result Value Ref Range   Sodium 137  137 - 147 mEq/L   Potassium 4.3  3.7 - 5.3 mEq/L   Chloride 104  96 - 112 mEq/L   CO2 22  19 - 32 mEq/L   Glucose, Bld 85  70 - 99 mg/dL   BUN 46 (*) 6 - 23 mg/dL   Creatinine, Ser 1.49 (*) 0.50 - 1.35 mg/dL   Calcium 8.7  8.4 - 10.5 mg/dL    GFR calc non Af Amer 48 (*) >90 mL/min   GFR calc Af Amer 55 (*) >90 mL/min   Comment: (NOTE)     The eGFR has been calculated using the CKD EPI equation.     This calculation has not been validated in all clinical situations.     eGFR's persistently <90 mL/min signify possible Chronic Kidney     Disease.   Anion gap 11  5 - 15  CBC     Status: Abnormal   Collection Time    02/01/14  4:12 AM      Result Value Ref Range   WBC 1.3 (*) 4.0 - 10.5 K/uL   Comment: CRITICAL RESULT CALLED TO, READ BACK BY AND VERIFIED WITH:     T.NICHOLS,RN AT 0500 ON 02/01/14 BY WSHEA   RBC 3.02 (*) 4.22 - 5.81 MIL/uL   Hemoglobin 8.7 (*) 13.0 - 17.0 g/dL   HCT 25.8 (*) 39.0 - 52.0 %   MCV 85.4  78.0 - 100.0 fL   MCH 28.8  26.0 - 34.0 pg   MCHC 33.7  30.0 - 36.0 g/dL   RDW 12.8  11.5 - 15.5 %   Platelets 105 (*) 150 - 400 K/uL   Comment: CONSISTENT WITH PREVIOUS RESULT  GLUCOSE, CAPILLARY     Status: None   Collection Time    02/01/14  7:29 AM      Result Value Ref Range   Glucose-Capillary 75  70 - 99 mg/dL  CBC     Status: Abnormal   Collection Time    02/02/14  4:00 AM      Result Value Ref Range   WBC 1.2 (*) 4.0 - 10.5 K/uL   Comment: CRITICAL RESULT CALLED TO, READ BACK BY AND VERIFIED WITH:     SPOKE WITH TURMAGE,R [id number] [place],N   RBC 2.85 (*) 4.22 - 5.81 MIL/uL   Hemoglobin 8.3 (*) 13.0 - 17.0 g/dL   HCT 24.6 (*) 39.0 - 52.0 %   MCV 86.3  78.0 - 100.0 fL   MCH 29.1  26.0 - 34.0 pg   MCHC 33.7  30.0 - 36.0 g/dL   RDW 12.8  11.5 - 15.5 %   Platelets 108 (*) 150 - 400 K/uL   Comment: CONSISTENT WITH PREVIOUS RESULT     SPECIMEN CHECKED FOR CLOTS     REPEATED TO VERIFY  BASIC METABOLIC PANEL     Status: Abnormal   Collection Time  02/02/14  4:00 AM      Result Value Ref Range   Sodium 143  137 - 147 mEq/L   Potassium 4.1  3.7 - 5.3 mEq/L   Chloride 111  96 - 112 mEq/L   CO2 19  19 - 32 mEq/L   Glucose, Bld 72  70 - 99 mg/dL   BUN 27 (*) 6 - 23 mg/dL   Comment: DELTA  CHECK NOTED     REPEATED TO VERIFY   Creatinine, Ser 1.18  0.50 - 1.35 mg/dL   Calcium 8.4  8.4 - 10.5 mg/dL   GFR calc non Af Amer 63 (*) >90 mL/min   GFR calc Af Amer 74 (*) >90 mL/min   Comment: (NOTE)     The eGFR has been calculated using the CKD EPI equation.     This calculation has not been validated in all clinical situations.     eGFR's persistently <90 mL/min signify possible Chronic Kidney     Disease.   Anion gap 13  5 - 15  APTT     Status: Abnormal   Collection Time    02/02/14  4:00 AM      Result Value Ref Range   aPTT 49 (*) 24 - 37 seconds   Comment:            IF BASELINE aPTT IS ELEVATED,     SUGGEST PATIENT RISK ASSESSMENT     BE USED TO DETERMINE APPROPRIATE     ANTICOAGULANT THERAPY.  PROTIME-INR     Status: None   Collection Time    02/02/14  4:00 AM      Result Value Ref Range   Prothrombin Time 14.5  11.6 - 15.2 seconds   INR 1.13  0.00 - 1.49  TYPE AND SCREEN     Status: None   Collection Time    02/02/14  4:00 AM      Result Value Ref Range   ABO/RH(D) A POS     Antibody Screen NEG     Sample Expiration 02/05/2014    ABO/RH     Status: None   Collection Time    02/02/14  4:00 AM      Result Value Ref Range   ABO/RH(D) A POS      Imaging / Studies: Portable Chest 1 View  01/31/2014   CLINICAL DATA:  Cough and chest congestion.  EXAM: PORTABLE CHEST - 1 VIEW  COMPARISON:  04/25/2010.  FINDINGS: Normal sized heart with a marked interval decrease in size. Clear lungs. The lungs remain hyperexpanded. Right jugular port catheter tip in the superior vena cava. Mild scoliosis.  IMPRESSION: No acute abnormality.  Stable cardiomegaly.   Electronically Signed   By: Enrique Sack M.D.   On: 01/31/2014 18:57    Medications / Allergies:  Scheduled Meds: . docusate sodium  100 mg Oral BID  . enoxaparin (LOVENOX) injection  30 mg Subcutaneous Q24H  . feeding supplement (ENSURE COMPLETE)  237 mL Oral BID BM  . lactose free nutrition  237 mL Oral TID WC  .  multivitamin with minerals  1 tablet Oral Daily  . nicotine  14 mg Transdermal Daily  . sodium chloride  3 mL Intravenous Q12H  . sucralfate  1 g Oral TID WC & HS  . thiamine  100 mg Oral Daily   Continuous Infusions: . sodium chloride 75 mL/hr at 02/02/14 0259   PRN Meds:.acetaminophen, acetaminophen, albuterol, bisacodyl, HYDROcodone-acetaminophen, ipratropium, lidocaine-prilocaine, morphine injection, nitroGLYCERIN, ondansetron (  ZOFRAN) IV, ondansetron, polyethylene glycol, PRUTECT  Antibiotics: Anti-infectives   None      Assessment 1. Dehydration/orthostasis  2. 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 Dysphagia secondary to cancer.  4. CAD/hx of CM EF 45-73%/RW of systolic CHF/ Dr. Cleatis Polka - Cardiology  5. Hx of ETOH/tobacco abuse  6. Hx of chronic pancreatitis  7. COPD  8. PCM/deconditioning  9. CHRONIC renal disease  10. Pancytopenia (WBC1.2, H/H 8.3/24.6, platelets 108K) Plan Proceed with laparoscopic assisted gastrostomy tube placement.  Risks of the surgery discussed in detail including but not limited to infection, bleeding and injury to surrounding anatomy.  The patient verbalizes understanding and wishes to proceed.  Obtain consent, keep NPO, hold anticoagulation, ancef on call to OR.  Erby Pian, [facility] Pager [phone] Office [phone]  02/02/2014 7:58 AM

## 2014-02-02 NOTE — Clinical Documentation Improvement (Signed)
Possible Clinical Conditions?   _______CKD Stage I - GFR > OR = 90 _______CKD Stage II - GFR 60-80 _______CKD Stage III - GFR 30-59 _______CKD Stage IV - GFR 15-29 _______CKD Stage V - GFR < 15 _______ESRD (End Stage Renal Disease) _______Other condition_____________ _______Cannot Clinically determine   Clinical Indicators:  Per 02/01/14 MD Progress Note: CHRONIC renal disease   Lab Value Ranges (84/15 - 02/02/14) CREATININE(mg/dL) 1.18 1.49H GFR, Est Afr Am(mL/min) 74L 55L    Thank You, Serena Colonel ,RN Clinical Documentation Specialist:  [facility] Information Management

## 2014-02-02 NOTE — Plan of Care (Signed)
Problem: Phase I Progression Outcomes Goal: Ventricular heart rate < 120/min Outcome: Completed/Met Date Met:  02/02/14 Patient remained NSR in the 70s this shift.

## 2014-02-02 NOTE — Transfer of Care (Signed)
Immediate Anesthesia Transfer of Care Note  Patient: Michael Keith  Procedure(s) Performed: Procedure(s) (LRB): LAPAROSCOPIC GASTROSTOMY TUBE PLACEMENT (N/A)  Patient Location: PACU  Anesthesia Type: General  Level of Consciousness: sedated, patient cooperative and responds to stimulation  Airway & Oxygen Therapy: Patient Spontanous Breathing and Patient connected to face mask oxgen  Post-op Assessment: Report given to PACU RN and Post -op Vital signs reviewed and stable  Post vital signs: Reviewed and stable  Complications: No apparent anesthesia complications

## 2014-02-02 NOTE — Progress Notes (Signed)
Visited patient in [place], he was sleeping.  Spoke with his RN Deidre Ala to communicate that he will continue receiving daily RT at [facility] during this admission; she verbalized awareness.  I also communicated that there was effort to have his PEG placed during this admission.  [name], RN, BSN, [facility] Head & Neck Oncology Navigator [phone]

## 2014-02-03 ENCOUNTER — Encounter (HOSPITAL_COMMUNITY): Payer: Self-pay | Admitting: Surgery

## 2014-02-03 ENCOUNTER — Encounter: Payer: Self-pay | Admitting: *Deleted

## 2014-02-03 ENCOUNTER — Ambulatory Visit (INDEPENDENT_AMBULATORY_CARE_PROVIDER_SITE_OTHER): Payer: Medicare Other | Admitting: General Surgery

## 2014-02-03 ENCOUNTER — Ambulatory Visit
Admission: RE | Admit: 2014-02-03 | Discharge: 2014-02-03 | Disposition: A | Discharge status: Home / Self Care | Payer: Medicare Other | Source: Ambulatory Visit | Attending: Radiation Oncology | Admitting: Radiation Oncology

## 2014-02-03 LAB — BASIC METABOLIC PANEL
ANION GAP: 14 (ref 5–15)
BUN: 24 mg/dL — AB (ref 6–23)
CHLORIDE: 110 meq/L (ref 96–112)
CO2: 18 mEq/L — ABNORMAL LOW (ref 19–32)
Calcium: 8.7 mg/dL (ref 8.4–10.5)
Creatinine, Ser: 1.19 mg/dL (ref 0.50–1.35)
GFR calc Af Amer: 73 mL/min — ABNORMAL LOW (ref >90)
GFR calc non Af Amer: 63 mL/min — ABNORMAL LOW (ref >90)
Glucose, Bld: 84 mg/dL (ref 70–99)
POTASSIUM: 4.9 meq/L (ref 3.7–5.3)
Sodium: 142 mEq/L (ref 137–147)

## 2014-02-03 LAB — CBC
HEMATOCRIT: 23.1 % — AB (ref 39.0–52.0)
Hemoglobin: 7.8 g/dL — ABNORMAL LOW (ref 13.0–17.0)
MCH: 29.3 pg (ref 26.0–34.0)
MCHC: 33.8 g/dL (ref 30.0–36.0)
MCV: 86.8 fL (ref 78.0–100.0)
Platelets: 100 10*3/uL — ABNORMAL LOW (ref 150–400)
RBC: 2.66 MIL/uL — AB (ref 4.22–5.81)
RDW: 12.9 % (ref 11.5–15.5)
WBC: 1.7 10*3/uL — AB (ref 4.0–10.5)

## 2014-02-03 LAB — GLUCOSE, CAPILLARY: Glucose-Capillary: 71 mg/dL (ref 70–99)

## 2014-02-03 MED ORDER — TAMSULOSIN HCL 0.4 MG PO CAPS
0.4000 mg | ORAL_CAPSULE | Freq: Every day | ORAL | Status: DC
  Filled 2014-02-03 (×2): qty 1

## 2014-02-03 MED ORDER — TERAZOSIN HCL 1 MG PO CAPS
1.0000 mg | ORAL_CAPSULE | Freq: Every day | ORAL | Status: DC
  Administered 2014-02-03 – 2014-02-06 (×2): 1 mg via ORAL
  Filled 2014-02-03 (×3): qty 1

## 2014-02-03 MED ORDER — JEVITY 1.2 CAL PO LIQD
1000.0000 mL | ORAL | Status: DC
  Administered 2014-02-04
  Filled 2014-02-03 (×3): qty 1000

## 2014-02-03 MED ORDER — FINASTERIDE 5 MG PO TABS
5.0000 mg | ORAL_TABLET | Freq: Every day | ORAL | Status: DC
  Filled 2014-02-03: qty 1

## 2014-02-03 MED ORDER — DEXTROSE-NACL 5-0.45 % IV SOLN
INTRAVENOUS | Status: DC
  Administered 2014-02-03 – 2014-02-06 (×6): via INTRAVENOUS
  Administered 2014-02-06: 1000 mL via INTRAVENOUS
  Administered 2014-02-06 – 2014-02-07 (×2): via INTRAVENOUS

## 2014-02-03 NOTE — Progress Notes (Signed)
NUTRITION FOLLOW UP  Intervention:   -On 8/07:Initiate Jevity 1.2 @ 20 ml/hr via PEG and increase by 10 ml every 8 hours to goal rate of 60 ml/hr. Increase advancement rate as tolerated  -Tube feeding regimen provides 1728 kcal (100% of needs), 80 grams of protein (100% est protein needs), and 1162 ml of H2O. Will require additional 540 ml free water flushes once IVF d/c'd. -For bolus regimen to use upon d/c, recommend Jevity 1.2 at six 237 ml (one can) daily to provide 1710 kcal (100% est protein needs), 80 gram protein (100% est protein needs), and 1146 ml free water -Diet advancement per MD, recommend Ensure Complete as tolerated  Nutrition Dx:   Inadequate oral intake related to difficulty swallowing/throat pain as evidenced by PO intake < 75%, ongoing weight loss; progressing with PEG placement   Goal:   Pt to meet >/= 90% of their estimated nutrition needs; progressing with PEG placement   Monitor:   TF tolerance, total protein/energy intake, diet order, labs, weights  Assessment:   8/04: -Pt reported ongoing weight loss, has lost approximately 7 lbs in one week d/t poor PO intake (6.5% body weight loss, severe for time frame)  -Is being followed by outpatient oncology RD. Last seen on 01/25/2014, recommended pt consume 5 Ensure Complete supplements daily. Pt reported to only be able to tolerate 2-3 supplements daily. Endorsed throat pain and cough that have contributed to decreased intake  -Pt enjoys both Boost and Ensure supplements. Will recommend BID and TID of each supplement for variety of taste and options.  -Feeding tube placement pending per MD note, will possibly be placed during admit.  -On full liquid diet, consuming < 50%. Encouraged intake of high kcal/protein foods that pt can tolerate- ice cream, pudding, milk, yogurt. Assisted pt in ordering meal  -BUN/Crt elevated d/t dehydration  8/06: -Pt s/p PEG placement on 8/05 -Received consult to initiate TF via PEG on 8/07  AM, with gradual advancement -Placed continuous TF recommendations with conservative advancement; transition to bolus feedings recommendations as tolerated.  -Phos/Mg/K WNL, pt moderate refeeding risk d/t ongoing weight loss and prolonged period of sub-optimal nutrition pta. Continue to monitor labs and replete as needed -Pt hungry and eager for diet advancement. Contacted RN, who confirmed pt allowed sips of clears. RN provided pt with jello and juice  Height: Ht Readings from Last 1 Encounters:  01/31/14 5\' 6"  (1.676 m)    Weight Status:   Wt Readings from Last 1 Encounters:  02/03/14 106 lb 3.2 oz (48.172 kg)    Re-estimated needs:  Kcal: 1600-1800  Protein: 80-90 gram  Fluid: >/= 1600 ml/daily  Skin: WDL  Diet Order: NPO   Intake/Output Summary (Last 24 hours) at 02/03/14 1422 Last data filed at 02/03/14 1100  Gross per 24 hour  Intake 1488.75 ml  Output    825 ml  Net 663.75 ml    Last BM: 8/05   Labs:   Recent Labs Lab 01/31/14 1825 02/01/14 0412 02/02/14 0400 02/02/14 2050 02/03/14 0405  NA 137 137 143  --  142  K 4.4 4.3 4.1  --  4.9  CL 102 104 111  --  110  CO2 23 22 19   --  18*  BUN 51* 46* 27*  --  24*  CREATININE 1.65* 1.49* 1.18 1.18 1.19  CALCIUM 8.9 8.7 8.4  --  8.7  MG 2.4  --   --   --   --   PHOS 3.8  --   --   --   --  GLUCOSE 178* 85 72  --  84    CBG (last 3)   Recent Labs  02/01/14 0729 02/02/14 0743 02/03/14 0738  GLUCAP 75 77 71    Scheduled Meds: . docusate sodium  100 mg Oral BID  . feeding supplement (ENSURE COMPLETE)  237 mL Oral BID BM  . heparin  5,000 Units Subcutaneous 3 times per day  . lactose free nutrition  237 mL Oral TID WC  . multivitamin with minerals  1 tablet Oral Daily  . nicotine  14 mg Transdermal Daily  . sodium chloride  10-40 mL Intracatheter Q12H  . sodium chloride  3 mL Intravenous Q12H  . sucralfate  1 g Oral TID WC & HS  . tamsulosin  0.4 mg Oral QPC breakfast  . thiamine  100 mg Oral  Daily    Continuous Infusions: . dextrose 5 % and 0.45% NaCl 100 mL/hr at 02/03/14 [place] LDN Clinical Dietitian [phone]

## 2014-02-03 NOTE — Progress Notes (Signed)
Bladder scan yielded 291 ml. Patient has not voided since 2230 last night (02/02/14) Patient stated that he did not feel the urge to uinate. PCP on call was notified.   PCP notified the RN the patient could have another bladder scan in a couple of hours and results sent to the PCP on call.  Will continue to monitor the patient.

## 2014-02-03 NOTE — Progress Notes (Signed)
CARE MANAGEMENT NOTE 02/03/2014  Patient:  Michael Keith, Michael Keith   Account Number:  [id number]  Date Initiated:  02/03/2014  Documentation initiated by:  Olga Coaster  Subjective/Objective Assessment:   ADMITTED WITH DYSPHAGIA, IRREGULAR HR     Action/Plan:   PATIENT RESIDES IN SNF; New Home   Anticipated DC Date:  02/07/2014   Anticipated DC Plan:  SKILLED NURSING FACILITY  In-house referral  Clinical Social Worker      DC Planning Services  CM consult          Status of service:  In process, will continue to follow Medicare Important Message given?  YES (If response is "NO", the following Medicare IM given date fields will be blank) Date Medicare IM given:  02/03/2014 Medicare IM given by:  Olga Coaster  Per UR Regulation:  Reviewed for med. necessity/level of care/duration of stay  Comments:  8/6/2015Mindi Slicker RN,BSN,MHA [phone]

## 2014-02-03 NOTE — Progress Notes (Signed)
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 Pt is s /p Laparoscopic gastrostomy tube placement, 02/02/2014    PT Comments    Pt doing well; he did not want to amb b/c he was afraid he would miss the  [employer] when they come get him for XRT; pt quite willing to perform therex in room.  Follow Up Recommendations  No PT follow up     Equipment Recommendations  None recommended by PT    Recommendations for Other Services       Precautions / Restrictions Precautions Precautions: Fall Restrictions Weight Bearing Restrictions: No    Mobility  Bed Mobility Overal bed mobility: Needs Assistance       Supine to sit: Min guard     General bed mobility comments: verbal cues  Transfers Overall transfer level: Needs assistance Equipment used: None Transfers: Sit to/from [employer] Transfers Sit to Stand: Min guard Stand pivot transfers: Min assist       General transfer comment: min assist to pivot around to chair. verbal cues hand placement.  Ambulation/Gait                 Stairs            Wheelchair Mobility    Modified Rankin (Stroke Patients Only)       Balance                                    Cognition Arousal/Alertness: Awake/alert Behavior During Therapy: WFL for tasks assessed/performed Overall Cognitive Status: Within Functional Limits for tasks assessed                       Exercises General Exercises - Lower Extremity Ankle Circles/Pumps: AROM;Supine;15 reps Quad Sets: AROM;Both;15 reps Heel Slides: Strengthening;Both;15 reps (with manual resistance) Hip ABduction/ADduction: Strengthening;Both;15 reps (with manual resistance) Straight Leg Raises: Strengthening;Both;15 reps (with manual resistance)    General Comments        Pertinent Vitals/Pain  denies pain    Home Living                      Prior Function            PT Goals (current goals can now be found in the care plan section) Acute Rehab PT Goals Patient Stated Goal: to go home Time For Goal Achievement: 02/15/14 Potential to Achieve Goals: Good Progress towards PT goals: Progressing toward goals    Frequency  Min 3X/week    PT Plan Current plan remains appropriate    Co-evaluation             End of Session   Activity Tolerance: Patient tolerated treatment well Patient left: in chair;with call bell/phone within reach     Time: 1455-1510 PT Time Calculation (min): 15 min  Charges:  $Therapeutic Exercise: 8-22 mins  G CodesKenyon Keith 02/03/2014, 3:14 PM

## 2014-02-03 NOTE — Progress Notes (Signed)
1 Day Post-Op  Subjective: Asking for something to eat, he says it hurts when he moves around.  He has not been up. Site looks fine.  Objective: Vital signs in last 24 hours: Temp:  [97.4 F (36.3 C)-98.9 F (37.2 C)] 98.9 F (37.2 C) (08/06 0438) Pulse Rate:  [63-90] 63 (08/06 0438) Resp:  [12-16] 16 (08/06 0438) BP: (106-125)/(61-87) 117/64 mmHg (08/06 0438) SpO2:  [90 %-100 %] 95 % (08/06 0438) Weight:  [48.172 kg (106 lb 3.2 oz)] 48.172 kg (106 lb 3.2 oz) (08/06 0438) Last BM Date: 02/02/14 NPO Afebrile, VSS Labs stable Intake/Output from previous day: 08/05 0701 - 08/06 0700 In: 2148.8 [I.V.:2148.8] Out: 830 [Urine:825; Blood:5] Intake/Output this shift:    General appearance: alert, cooperative, no distress and sore GI: soft, FT in place, no leakage, port sites look fine few Bs.  Lab Results:   Recent Labs  02/02/14 2050 02/03/14 0405  WBC 1.6* 1.7*  HGB 8.8* 7.8*  HCT 26.5* 23.1*  PLT 100* 100*    BMET  Recent Labs  02/02/14 0400 02/02/14 2050 02/03/14 0405  NA 143  --  142  K 4.1  --  4.9  CL 111  --  110  CO2 19  --  18*  GLUCOSE 72  --  84  BUN 27*  --  24*  CREATININE 1.18 1.18 1.19  CALCIUM 8.4  --  8.7   PT/INR  Recent Labs  01/31/14 1825 02/02/14 0400  LABPROT 14.2 14.5  INR 1.10 1.13     Recent Labs Lab 01/31/14 1825  AST 22  ALT 10  ALKPHOS 58  BILITOT 0.3  PROT 7.9  ALBUMIN 3.5     Lipase     Component Value Date/Time   LIPASE 33 10/24/2009 1908     Studies/Results: No results found.  Medications: . docusate sodium  100 mg Oral BID  . feeding supplement (ENSURE COMPLETE)  237 mL Oral BID BM  . heparin  5,000 Units Subcutaneous 3 times per day  . lactose free nutrition  237 mL Oral TID WC  . multivitamin with minerals  1 tablet Oral Daily  . nicotine  14 mg Transdermal Daily  . sodium chloride  10-40 mL Intracatheter Q12H  . sodium chloride  3 mL Intravenous Q12H  . sucralfate  1 g Oral TID WC & HS  .  thiamine  100 mg Oral Daily    Assessment/Plan Squamous cell carcinoma of the esophagus, s/p Laparoscopic gastrostomy tube placement, 02/02/2014,  Michael Earls, MD 1. Dehydration/orthostasis  2. 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 Dysphagia secondary to cancer.  4. CAD/hx of CM EF 34-74%/QV of systolic CHF/ Dr. Cleatis Polka - Cardiology  5. Hx of ETOH/tobacco abuse  6. Hx of chronic pancreatitis  7. COPD  8. PCM/deconditioning  9. CHRONIC renal disease  10. Pancytopenia (WBC1.3, H/H 8.7/25.8, platelets 105K)     Plan:  Sips and chips today, we can start TF in AM and advance his diet, start tube feeds in AM.         LOS: 3 days    [name] 02/03/2014

## 2014-02-03 NOTE — Progress Notes (Signed)
[facility] PROGRESS NOTE  Michael Keith:[id number] DOB: August 07, 1950 DOA: 01/31/2014 PCP: Philis Fendt, MD  HPI/Subjective: No much of significant complaints, hungry. Had urinary retention last night had to have in and out cath.  Assessment/Plan:  Dehydration/orthostasis: due to poor PO intake -Continue IVF's -continue holding diuretics and antihypertensive agents -close follow up and reassessment of volume status -G-tube placed today laparoscopically by Dr. Hassell Done.  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 -Per oncology, no evidence of fever, and she still >500, observe.  Chronic systolyc heart failure and CAD: compensated. No CP or SOB. -holding diuretics and ASA -continue daily weights -Strict I's and O's  COPD: compensated: continue PRN albuterol  Tobacco abuse: cessation counseling provided -started on nicotine patch  Severe protein calorie malnutrition: continue feeding supplements -CCS evaluating for feeding tube placement and hopefully better nutrition  Urinary retention -Likely patient has prostatic hypertrophy, start Flomax, in and out cath as needed. -Patient also getting narcotics which might cause urinary retention  DVT: lovenox   Code Status: Full Family Communication: no family at bedside Disposition Plan: to be determine   Consultants:  Oncology   CCS  Procedures:  None   Antibiotics:  None   Objective: Filed Vitals:   02/03/14 0438  BP: 117/64  Pulse: 63  Temp: 98.9 F (37.2 C)  Resp: 16    Intake/Output Summary (Last 24 hours) at 02/03/14 1323 Last data filed at 02/03/14 1100  Gross per 24 hour  Intake 1498.75 ml  Output    825 ml  Net 673.75 ml    Filed Weights   02/01/14 0502 02/02/14 0543 02/03/14 0438  Weight: 45.677 kg (100 lb 11.2 oz) 46.63 kg (102 lb 12.8 oz) 48.172 kg (106 lb 3.2 oz)    Exam:   General:  Frail, cachetic and no severe distress, afebrile and complaining of throat/upper mid chest discomfort  Cardiovascular: S1 and S2, regular rate; no rubs or gallops; no JVD  Respiratory: good air movement, no crackles or wheezing  Abdomen: soft, NT, ND, positive BS  Musculoskeletal: no cyanosis or edema  Data Reviewed: Basic Metabolic Panel:  Recent Labs Lab 01/31/14 1825 02/01/14 0412 02/02/14 0400 02/02/14 2050 02/03/14 0405  NA 137 137 143  --  142  K 4.4 4.3 4.1  --  4.9  CL 102 104 111  --  110  CO2 23 22 19   --  18*  GLUCOSE 178* 85 72  --  84  BUN 51* 46* 27*  --  24*  CREATININE 1.65* 1.49* 1.18 1.18 1.19  CALCIUM 8.9 8.7 8.4  --  8.7  MG 2.4  --   --   --   --   PHOS 3.8  --   --   --   --    Liver Function Tests:  Recent Labs Lab 01/31/14 1825  AST 22  ALT 10  ALKPHOS 58  BILITOT 0.3  PROT 7.9  ALBUMIN 3.5   CBC:  Recent Labs Lab 01/31/14 1825 02/01/14 0412 02/02/14 0400 02/02/14 2050 02/03/14 0405  WBC 1.5* 1.3* 1.2* 1.6* 1.7*  NEUTROABS 0.9*  --   --   --   --   HGB 9.2* 8.7* 8.3* 8.8* 7.8*  HCT 26.8* 25.8* 24.6* 26.5* 23.1*  MCV 85.6 85.4  86.3 87.7 86.8  PLT 105* 105* 108* 100* 100*   CBG:  Recent Labs Lab 02/01/14 0729 02/02/14 0743 02/03/14 0738  GLUCAP 75 77 71    Recent Results (from the past 240 hour(s))  URINE CULTURE     Status: None   Collection Time    02/01/14  2:00 AM      Result Value Ref Range Status   Specimen Description URINE, RANDOM   Final   Special Requests NONE   Final   Culture  Setup Time     Final   Value: 02/01/2014 11:10     Performed at [place]     Final   Value: 4,000 COLONIES/ML     Performed at [facility]   Culture     Final   Value: INSIGNIFICANT GROWTH     Performed at [facility]   Report Status 02/02/2014 FINAL   Final  SURGICAL PCR SCREEN     Status: Abnormal   Collection Time    02/02/14  8:50 AM      Result Value Ref Range Status   MRSA, PCR INVALID RESULTS, SPECIMEN SENT FOR CULTURE (*) NEGATIVE Final   Comment: NOTIFIED C. OBERRY RN AT 1105 ON 08.05.15 BY SHUEA   Staphylococcus aureus INVALID RESULTS, SPECIMEN SENT FOR CULTURE (*) NEGATIVE Final   Comment: NOTIFIED C. OBERRY RN AT 9678 ON 08.05.15 BY SHUEA                The Xpert SA Assay (FDA     approved for NASAL specimens     in patients over 64 years of age),     is one component of     a comprehensive surveillance     program.  Test performance has     been validated by [employer] for patients greater     than or equal to 18 year old.     It is not intended     to diagnose infection nor to     guide or monitor treatment.  MRSA CULTURE     Status: None   Collection Time    02/02/14  8:50 AM      Result Value Ref Range Status   Specimen Description NOSE   Final   Special Requests NONE   Final   Culture     Final   Value: NO SUSPICIOUS COLONIES, CONTINUING TO HOLD     Performed at [facility]   Report Status PENDING   Incomplete     Studies: No results found.  Scheduled Meds: . docusate sodium  100 mg Oral BID  . feeding supplement (ENSURE COMPLETE)  237 mL Oral BID BM  . heparin  5,000 Units Subcutaneous 3 times per day  . lactose free nutrition  237 mL Oral TID WC  . multivitamin with minerals  1 tablet Oral Daily  . nicotine  14 mg Transdermal Daily  . sodium chloride  10-40 mL Intracatheter Q12H  . sodium chloride  3 mL Intravenous Q12H  . sucralfate  1 g Oral TID WC & HS  . thiamine  100 mg Oral Daily   Continuous Infusions: . sodium chloride 75 mL/hr at 02/03/14 1236    Principal Problem:   Dehydration Active Problems:   Nonischemic cardiomyopathy   Tobacco abuse   COPD (chronic obstructive pulmonary disease)   CAD (coronary artery  disease)   Chronic systolic CHF (  congestive heart failure)   Dysphasia   Squamous cell esophageal cancer   Cancer of upper third of esophagus   Orthostasis   FTT (failure to thrive) in adult   Severe protein-calorie malnutrition    Time spent: >30 minutes    [place] [facility] Pager [phone]. If 7PM-7AM, please contact night-coverage at [email or web address], password [facility] 02/03/2014, 1:23 PM  LOS: 3 days

## 2014-02-03 NOTE — Progress Notes (Signed)
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 Pt is s /p Laparoscopic gastrostomy tube placement, 02/02/2014   OT comments  Pt tolerated up to chair well with no complaint of pain. States he only has mild discomfort with coughing. He is able to continue crossing LEs up to don LB clothing without difficulty. Will continue toward established goals.    Follow Up Recommendations  No OT follow up    Equipment Recommendations  None recommended by OT    Recommendations for Other Services      Precautions / Restrictions Precautions Precautions: Fall Restrictions Weight Bearing Restrictions: No       Mobility Bed Mobility Overal bed mobility: Needs Assistance       Supine to sit: Min guard     General bed mobility comments: verbal cues  Transfers Overall transfer level: Needs assistance Equipment used: None Transfers: Sit to/from Stand;Stand Pivot Transfers Sit to Stand: Min guard Stand pivot transfers: Min assist       General transfer comment: min assist to pivot around to chair. verbal cues hand placement.    Balance                                   ADL                           Toilet Transfer: Minimal assistance;Stand-pivot             General ADL Comments: Pt up to chair without assistive device stand pivot. Min assist to steady and verbal cues for hand placement. Able to cross LEs up to don  socks without pain or difficulty. Feel pt's goals are appropriate after g-tube placement to continue to work toward. Assisted with changing gown with min assist only to manage lines.       Vision                     Perception     Praxis      Cognition   Behavior During Therapy: WFL for tasks assessed/performed Overall Cognitive Status: Within Functional Limits for tasks assessed                       Extremity/Trunk Assessment               Exercises     Shoulder Instructions       General Comments      Pertinent Vitals/ Pain          Home Living                                          Prior Functioning/Environment              Frequency Min 2X/week     Progress  Toward Goals  OT Goals(current goals can now be found in the care plan section)  Progress towards OT goals: Progressing toward goals     Plan Discharge plan remains appropriate    Co-evaluation                 End of Session     Activity Tolerance Patient tolerated treatment well   Patient Left in chair;with call bell/phone within reach   Nurse Communication          Time: 4920-1007 OT Time Calculation (min): 23 min  Charges: OT General Charges $OT Visit: 1 Procedure OT Treatments $Self Care/Home Management : 8-22 mins $Therapeutic Activity: 8-22 mins  Jules Schick [phone] 02/03/2014, 1:07 PM

## 2014-02-04 ENCOUNTER — Inpatient Hospital Stay (HOSPITAL_COMMUNITY): Payer: Medicare Other

## 2014-02-04 ENCOUNTER — Ambulatory Visit: Admission: RE | Admit: 2014-02-04 | Payer: Medicare Other | Source: Ambulatory Visit

## 2014-02-04 ENCOUNTER — Encounter: Payer: Self-pay | Admitting: *Deleted

## 2014-02-04 DIAGNOSIS — I951 Orthostatic hypotension: Secondary | ICD-10-CM

## 2014-02-04 LAB — GLUCOSE, CAPILLARY
GLUCOSE-CAPILLARY: 154 mg/dL — AB (ref 70–99)
Glucose-Capillary: 129 mg/dL — ABNORMAL HIGH (ref 70–99)
Glucose-Capillary: 142 mg/dL — ABNORMAL HIGH (ref 70–99)

## 2014-02-04 LAB — CBC
HEMATOCRIT: 24 % — AB (ref 39.0–52.0)
Hemoglobin: 7.9 g/dL — ABNORMAL LOW (ref 13.0–17.0)
MCH: 28.6 pg (ref 26.0–34.0)
MCHC: 32.9 g/dL (ref 30.0–36.0)
MCV: 87 fL (ref 78.0–100.0)
Platelets: 113 10*3/uL — ABNORMAL LOW (ref 150–400)
RBC: 2.76 MIL/uL — ABNORMAL LOW (ref 4.22–5.81)
RDW: 13.2 % (ref 11.5–15.5)
WBC: 1.9 10*3/uL — AB (ref 4.0–10.5)

## 2014-02-04 LAB — BASIC METABOLIC PANEL
Anion gap: 8 (ref 5–15)
BUN: 20 mg/dL (ref 6–23)
CALCIUM: 8.9 mg/dL (ref 8.4–10.5)
CHLORIDE: 108 meq/L (ref 96–112)
CO2: 22 mEq/L (ref 19–32)
Creatinine, Ser: 0.98 mg/dL (ref 0.50–1.35)
GFR calc Af Amer: >90 mL/min (ref >90)
GFR calc non Af Amer: 85 mL/min — ABNORMAL LOW (ref >90)
Glucose, Bld: 152 mg/dL — ABNORMAL HIGH (ref 70–99)
Potassium: 4.2 mEq/L (ref 3.7–5.3)
Sodium: 138 mEq/L (ref 137–147)

## 2014-02-04 LAB — MRSA CULTURE

## 2014-02-04 LAB — PREPARE RBC (CROSSMATCH)

## 2014-02-04 MED ORDER — METOCLOPRAMIDE HCL 5 MG/ML IJ SOLN
5.0000 mg | Freq: Three times a day (TID) | INTRAMUSCULAR | Status: DC | PRN
  Administered 2014-02-04 – 2014-02-06 (×3): 5 mg via INTRAVENOUS
  Filled 2014-02-04 (×3): qty 2

## 2014-02-04 MED ORDER — SODIUM CHLORIDE 0.9 % IJ SOLN
10.0000 mL | INTRAMUSCULAR | Status: DC | PRN
  Administered 2014-02-05 – 2014-02-07 (×4): 10 mL

## 2014-02-04 MED ORDER — PANTOPRAZOLE SODIUM 40 MG IV SOLR
40.0000 mg | Freq: Two times a day (BID) | INTRAVENOUS | Status: DC
  Administered 2014-02-04 – 2014-02-07 (×6): 40 mg via INTRAVENOUS
  Filled 2014-02-04 (×9): qty 40

## 2014-02-04 MED ORDER — SODIUM CHLORIDE 0.9 % IV SOLN
Freq: Once | INTRAVENOUS | Status: AC
  Administered 2014-02-04: 10 mL/h via INTRAVENOUS

## 2014-02-04 NOTE — Progress Notes (Signed)
Met with patient [facility] [phone] after his daily RT to provide support, encouragement, education.  Patient stated he is on clear liquid diet, PEG feedings to begin tomorrow.  I obtained mirror so patient could better see feeding tube.  Using Teach Back, explained procedure for administering nutritional supplement.  Patient expressed basic understanding; he will need additional education prior to DC.  I explained to him that we will provide ongoing instruction/support in use of the PEG when he comes for RT.  Spoke with his RN, noted that an appointment with [facility] is needed; she verbalized understanding.  I told patient that I have cancelled his SCAT appts for the remainder of the week.  [name], RN, BSN, [facility] On[facility] Navigator [phone]

## 2014-02-04 NOTE — Care Management Note (Signed)
    Page 1 of 1   02/04/2014     12:44:43 PM CARE MANAGEMENT NOTE 02/04/2014  Patient:  Michael Keith, Michael Keith   Account Number:  [id number]  Date Initiated:  02/03/2014  Documentation initiated by:  Olga Coaster  Subjective/Objective Assessment:   ADMITTED WITH DYSPHAGIA, IRREGULAR HR     Action/Plan:   PATIENT lives at home alone; [place]   Anticipated DC Date:  02/07/2014   Anticipated DC Plan:  [place]  In-house referral  Clinical Social Worker      DC Planning Services  CM consult      Choice offered to / List presented to:             [place].   Status of service:  In process, will continue to follow Medicare Important Message given?  YES (If response is "NO", the following Medicare IM given date fields will be blank) Date Medicare IM given:  02/03/2014 Medicare IM given by:  Olga Coaster Date Additional Medicare IM given:   Additional Medicare IM given by:    Discharge Disposition:    Per UR Regulation:  Reviewed for med. necessity/level of care/duration of stay  If discussed at [place] of Stay Meetings, dates discussed:    Comments:  02-04-14 Sunday Spillers RN CM [phone] Spoke with patient at bedside. States he lives at home alone, has a neighbor who helps him some but he has recently had surgery. Discussed with patient need for assistance with new g-tube. Patient states he has used [facility] in the past for [facility] needs but was interested in short term rehab to regain strength and get education regarding g-tube. Spoke with Liliane Channel, oncology navigator, and he is very concerned about patients ability to care for self at home. He agrees a rehab stay may be beneficial. Contacted CSW she will f/u with patient and family.  8/6/2015Mindi Slicker RN,BSN,MHA [phone]

## 2014-02-04 NOTE — Progress Notes (Signed)
Events noted in the last few days. Labs reviewed as well.  I agree with current plan with supportive care and daily radiation. We will continue to monitor his count recovery before restarting chemotherapy.  His counts are still and will assess next week for chemotherapy restart.  His WBC is 1.9 without fevers.  Please call with questions over the weekend.  I will see on Monday 8/10.

## 2014-02-04 NOTE — Progress Notes (Signed)
Assumed care of pt. No changes in am assessment at this time. Cont with plan of care. Pt resting quietly in bed. Without nausea and vomiting at this time

## 2014-02-04 NOTE — Progress Notes (Signed)
2 Days Post-Op  Subjective: Having some nausea,   Objective: Vital signs in last 24 hours: Temp:  [97.9 F (36.6 C)-98.7 F (37.1 C)] 98.7 F (37.1 C) (08/07 0523) Pulse Rate:  [61-65] 65 (08/07 0523) Resp:  [14-16] 14 (08/07 0523) BP: (127-143)/(72) 127/72 mmHg (08/07 0523) SpO2:  [95 %-100 %] 100 % (08/07 0523) Weight:  [49.896 kg (110 lb)] 49.896 kg (110 lb) (08/07 0523) Last BM Date: 02/04/14 60 ml PO recorded.  Clears ordered but I don't see a tray in the room.   Afebrile, VSS WBC is still low Intake/Output from previous day: 08/06 0701 - 08/07 0700 In: 1759.3 [P.O.:60; I.V.:1580; NG/GT:119.3] Out: 1075 [Urine:1075] Intake/Output this shift: Total I/O In: -  Out: 200 [Urine:200]  General appearance: alert, cooperative and no distress GI: soft sore, tube site and ports look fine.  +BS.  Lab Results:   Recent Labs  02/03/14 0405 02/04/14 0607  WBC 1.7* 1.9*  HGB 7.8* 7.9*  HCT 23.1* 24.0*  PLT 100* 113*    BMET  Recent Labs  02/03/14 0405 02/04/14 0607  NA 142 138  K 4.9 4.2  CL 110 108  CO2 18* 22  GLUCOSE 84 152*  BUN 24* 20  CREATININE 1.19 0.98  CALCIUM 8.7 8.9   PT/INR  Recent Labs  02/02/14 0400  LABPROT 14.5  INR 1.13     Recent Labs Lab 01/31/14 1825  AST 22  ALT 10  ALKPHOS 58  BILITOT 0.3  PROT 7.9  ALBUMIN 3.5     Lipase     Component Value Date/Time   LIPASE 33 10/24/2009 1908     Studies/Results: No results found.  Medications: . sodium chloride   Intravenous Once  . docusate sodium  100 mg Oral BID  . heparin  5,000 Units Subcutaneous 3 times per day  . multivitamin with minerals  1 tablet Oral Daily  . nicotine  14 mg Transdermal Daily  . sodium chloride  10-40 mL Intracatheter Q12H  . sodium chloride  3 mL Intravenous Q12H  . sucralfate  1 g Oral TID WC & HS  . terazosin  1 mg Oral QHS  . thiamine  100 mg Oral Daily    Assessment/Plan Squamous cell carcinoma of the esophagus, s/p Laparoscopic  gastrostomy tube placement, 02/02/2014, Pedro Earls, MD  1. Dehydration/orthostasis  2. 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 Dysphagia secondary to cancer.  4. CAD/hx of CM EF 43-73%/HD of systolic CHF/ Dr. Cleatis Polka - Cardiology  5. Hx of ETOH/tobacco abuse  6. Hx of chronic pancreatitis  7. COPD  8. PCM/deconditioning  9. CHRONIC renal disease  10. Pancytopenia (WBC1.3, H/H 8.7/25.8, platelets 105K)      Plan:  Full liquids as tolerated and you can start tube feeds today, I would start slow and work up, since he is having some nausea.  LOS: 4 days    Zala Degrasse 02/04/2014

## 2014-02-04 NOTE — Progress Notes (Signed)
NUTRITION FOLLOW UP  Intervention:   -On 8/07:Initiate Jevity 1.2 @ 10 ml/hr via PEG and increase by 10 ml every 8 hours to goal rate of 60 ml/hr. Increase advancement rate as tolerated  -Tube feeding regimen provides 1728 kcal (100% of needs), 80 grams of protein (100% est protein needs), and 1162 ml of H2O. Will require additional 540 ml free water flushes once IVF d/c'd. -For bolus regimen to use upon d/c, recommend Jevity 1.2 at six 237 ml (one can) daily to provide 1710 kcal (100% est protein needs), 80 gram protein (100% est protein needs), and 1146 ml free water -Diet advancement per MD, recommend Ensure Complete as tolerated  Nutrition Dx:   Inadequate oral intake related to difficulty swallowing/throat pain as evidenced by PO intake < 75%, ongoing weight loss; progressing with PEG placement   Goal:   Pt to meet >/= 90% of their estimated nutrition needs; progressing with PEG placement   Monitor:   TF tolerance, total protein/energy intake, diet order, labs, weights  Assessment:   8/04: -Pt reported ongoing weight loss, has lost approximately 7 lbs in one week d/t poor PO intake (6.5% body weight loss, severe for time frame)  -Is being followed by outpatient oncology RD. Last seen on 01/25/2014, recommended pt consume 5 Ensure Complete supplements daily. Pt reported to only be able to tolerate 2-3 supplements daily. Endorsed throat pain and cough that have contributed to decreased intake  -Pt enjoys both Boost and Ensure supplements. Will recommend BID and TID of each supplement for variety of taste and options.  -Feeding tube placement pending per MD note, will possibly be placed during admit.  -On full liquid diet, consuming < 50%. Encouraged intake of high kcal/protein foods that pt can tolerate- ice cream, pudding, milk, yogurt. Assisted pt in ordering meal  -BUN/Crt elevated d/t dehydration  8/06: -Pt s/p PEG placement on 8/05 -Received consult to initiate TF via PEG on 8/07  AM, with gradual advancement -Placed continuous TF recommendations with conservative advancement; transition to bolus feedings recommendations as tolerated.  -Phos/Mg/K WNL, pt moderate refeeding risk d/t ongoing weight loss and prolonged period of sub-optimal nutrition pta. Continue to monitor labs and replete as needed -Pt hungry and eager for diet advancement. Contacted RN, who confirmed pt allowed sips of clears. RN provided pt with jello and juice  8/07: -Jevity 1.2 initiated at 20 ml/hr. Per discussion with RN, pt with episodes of nausea and vomiting when TF reached 30 ml/hr. TF was held. -Surgery recommended TF be re-initiated with a full liquid diet advancement. Modified tube feeding orders to be initiated a 10 ml/hr with a conservative advancement. Can be adjusted as tolerated by patient -Pt educated by [facility] Oncology Navigator on PEG feedings, recommended additional teachings.  Height: Ht Readings from Last 1 Encounters:  01/31/14 5\' 6"  (1.676 m)    Weight Status:   Wt Readings from Last 1 Encounters:  02/04/14 110 lb (49.896 kg)    Re-estimated needs:  Kcal: 1600-1800  Protein: 80-90 gram  Fluid: >/= 1600 ml/daily  Skin: WDL  Diet Order: Clear Liquid   Intake/Output Summary (Last 24 hours) at 02/04/14 1225 Last data filed at 02/04/14 1215  Gross per 24 hour  Intake 2321.83 ml  Output   1075 ml  Net 1246.83 ml    Last BM: 8/07   Labs:   Recent Labs Lab 01/31/14 1825  02/02/14 0400 02/02/14 2050 02/03/14 0405 02/04/14 0607  NA 137  < > 143  --  142 138  K 4.4  < > 4.1  --  4.9 4.2  CL 102  < > 111  --  110 108  CO2 23  < > 19  --  18* 22  BUN 51*  < > 27*  --  24* 20  CREATININE 1.65*  < > 1.18 1.18 1.19 0.98  CALCIUM 8.9  < > 8.4  --  8.7 8.9  MG 2.4  --   --   --   --   --   PHOS 3.8  --   --   --   --   --   GLUCOSE 178*  < > 72  --  84 152*  < > = values in this interval not displayed.  CBG (last 3)   Recent Labs  02/02/14 0743  02/03/14 0738 02/04/14 0744  GLUCAP 77 71 129*    Scheduled Meds: . docusate sodium  100 mg Oral BID  . heparin  5,000 Units Subcutaneous 3 times per day  . multivitamin with minerals  1 tablet Oral Daily  . nicotine  14 mg Transdermal Daily  . sodium chloride  10-40 mL Intracatheter Q12H  . sodium chloride  3 mL Intravenous Q12H  . sucralfate  1 g Oral TID WC & HS  . terazosin  1 mg Oral QHS  . thiamine  100 mg Oral Daily    Continuous Infusions: . dextrose 5 % and 0.45% NaCl 100 mL/hr at 02/04/14 0924  . feeding supplement (JEVITY 1.2 CAL) 20 mL/hr at 02/04/14 [place] LDN Clinical Dietitian [id number]

## 2014-02-04 NOTE — Progress Notes (Signed)
PT Cancellation Note  Patient Details Name: Michael Keith MRN: [id number] DOB: 1951/02/05   Cancelled Treatment:    Reason Eval/Treat Not Completed: Medical issues which prohibited therapy--pt vomiting. Will hold PT and check back another day.    Weston Anna, MPT Pager: [phone]

## 2014-02-04 NOTE — Progress Notes (Signed)
[facility] PROGRESS NOTE  Michael Keith KKX:[id number] DOB: 01-Mar-1951 DOA: 01/31/2014 PCP: Philis Fendt, MD  HPI/Subjective: Started to have nausea and vomited 2-3 times since 2 feeding started. Abdominal x-ray showed no SBO, some colonic dilatation. Restart tube feeding at slower rate.  Assessment/Plan:  Dehydration/orthostasis: due to poor PO intake -Continue IVF's -continue holding diuretics and antihypertensive agents -close follow up and reassessment of volume status -G-tube placed 8/6 laparoscopically by Dr. Hassell Done.  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 -Per oncology, no evidence of fever, and she still >500, observe.  Chronic systolyc heart failure and CAD: compensated. No CP or SOB. -holding diuretics and ASA -continue daily weights -Strict I's and O's  COPD: compensated: continue PRN albuterol  Tobacco abuse: cessation counseling provided -started on nicotine patch  Severe protein calorie malnutrition: continue feeding supplements -CCS evaluating for feeding tube placement and hopefully better nutrition  Urinary retention -Likely patient has prostatic hypertrophy, start Flomax, in and out cath as needed. -Patient also getting narcotics which might cause urinary retention  DVT: lovenox   Code Status: Full Family Communication: no family at bedside Disposition Plan: to be determine   Consultants:  Oncology   CCS  Procedures:  None   Antibiotics:  None   Objective: Filed Vitals:   02/04/14 1215  BP: 128/65  Pulse: 62  Temp: 99.1 F (37.3 C)  Resp: 20    Intake/Output Summary (Last 24 hours) at 02/04/14 1254 Last data filed at 02/04/14 1215  Gross per 24 hour   Intake 2321.83 ml  Output   1075 ml  Net 1246.83 ml   Filed Weights   02/02/14 0543 02/03/14 0438 02/04/14 0523  Weight: 46.63 kg (102 lb 12.8 oz) 48.172 kg (106 lb 3.2 oz) 49.896 kg (110 lb)    Exam:   General:  Frail, cachetic and no severe distress, afebrile and complaining of throat/upper mid chest discomfort  Cardiovascular: S1 and S2, regular rate; no rubs or gallops; no JVD  Respiratory: good air movement, no crackles or wheezing  Abdomen: soft, NT, ND, positive BS  Musculoskeletal: no cyanosis or edema  Data Reviewed: Basic Metabolic Panel:  Recent Labs Lab 01/31/14 1825 02/01/14 0412 02/02/14 0400 02/02/14 2050 02/03/14 0405 02/04/14 0607  NA 137 137 143  --  142 138  K 4.4 4.3 4.1  --  4.9 4.2  CL 102 104 111  --  110 108  CO2 23 22 19   --  18* 22  GLUCOSE 178* 85 72  --  84 152*  BUN 51* 46* 27*  --  24* 20  CREATININE 1.65* 1.49* 1.18 1.18 1.19 0.98  CALCIUM 8.9 8.7 8.4  --  8.7 8.9  MG 2.4  --   --   --   --   --   PHOS 3.8  --   --   --   --   --    Liver Function Tests:  Recent Labs Lab 01/31/14 1825  AST 22  ALT 10  ALKPHOS 58  BILITOT 0.3  PROT 7.9  ALBUMIN 3.5   CBC:  Recent Labs Lab 01/31/14 1825 02/01/14 0412 02/02/14 0400 02/02/14 2050 02/03/14 0405 02/04/14 0607  WBC 1.5* 1.3* 1.2* 1.6* 1.7* 1.9*  NEUTROABS 0.9*  --   --   --   --   --  HGB 9.2* 8.7* 8.3* 8.8* 7.8* 7.9*  HCT 26.8* 25.8* 24.6* 26.5* 23.1* 24.0*  MCV 85.6 85.4 86.3 87.7 86.8 87.0  PLT 105* 105* 108* 100* 100* 113*   CBG:  Recent Labs Lab 02/01/14 0729 02/02/14 0743 02/03/14 0738 02/04/14 0744  GLUCAP 75 77 71 129*    Recent Results (from the past 240 hour(s))  URINE CULTURE     Status: None   Collection Time    02/01/14  2:00 AM      Result Value Ref Range Status   Specimen Description URINE, RANDOM   Final   Special Requests NONE   Final   Culture  Setup Time     Final   Value: 02/01/2014 11:10     Performed at [place]     Final   Value: 4,000 COLONIES/ML     Performed at [facility]   Culture     Final   Value: INSIGNIFICANT GROWTH     Performed at [facility]   Report Status 02/02/2014 FINAL   Final  SURGICAL PCR SCREEN     Status: Abnormal   Collection Time    02/02/14  8:50 AM      Result Value Ref Range Status   MRSA, PCR INVALID RESULTS, SPECIMEN SENT FOR CULTURE (*) NEGATIVE Final   Comment: NOTIFIED C. OBERRY RN AT 1105 ON 08.05.15 BY SHUEA   Staphylococcus aureus INVALID RESULTS, SPECIMEN SENT FOR CULTURE (*) NEGATIVE Final   Comment: NOTIFIED C. OBERRY RN AT 0626 ON 08.05.15 BY SHUEA                The Xpert SA Assay (FDA     approved for NASAL specimens     in patients over 39 years of age),     is one component of     a comprehensive surveillance     program.  Test performance has     been validated by [employer] for patients greater     than or equal to 74 year old.     It is not intended     to diagnose infection nor to     guide or monitor treatment.  MRSA CULTURE     Status: None   Collection Time    02/02/14  8:50 AM      Result Value Ref Range Status   Specimen Description NOSE   Final   Special Requests NONE   Final   Culture     Final   Value: NO STAPHYLOCOCCUS AUREUS ISOLATED     Note: NOMRSA     Performed at [facility]   Report Status 02/04/2014 FINAL   Final     Studies: Dg Abd 2 Views  02/04/2014   CLINICAL DATA:  Nausea and vomiting  EXAM: ABDOMEN - 2 VIEW  COMPARISON:  None.  FINDINGS: Gastrostomy tube is in place. No free intraperitoneal gas on the decubitus image. Diffuse colonic distension is noted. Phleboliths project over the pelvis.  IMPRESSION: Diffuse colonic distention.  No free intraperitoneal gas.   Electronically Signed   By: Maryclare Bean M.D.   On: 02/04/2014 11:12    Scheduled Meds: . docusate sodium  100 mg Oral BID  . heparin  5,000 Units Subcutaneous 3 times per day  . multivitamin with minerals  1  tablet Oral Daily  . nicotine  14 mg Transdermal Daily  . sodium chloride  10-40 mL Intracatheter Q12H  . sodium chloride  3 mL Intravenous Q12H  . sucralfate  1 g Oral TID WC & HS  . terazosin  1 mg Oral QHS  . thiamine  100 mg Oral Daily   Continuous Infusions: . dextrose 5 % and 0.45% NaCl 100 mL/hr at 02/04/14 0924  . feeding supplement (JEVITY 1.2 CAL) 20 mL/hr at 02/04/14 0002    Principal Problem:   Dehydration Active Problems:   Nonischemic cardiomyopathy   Tobacco abuse   COPD (chronic obstructive pulmonary disease)   CAD (coronary artery disease)   Chronic systolic CHF (congestive heart failure)   Dysphasia   Squamous cell esophageal cancer   Cancer of upper third of esophagus   Orthostasis   FTT (failure to thrive) in adult   Severe protein-calorie malnutrition    Time spent: >30 minutes    [place] [place] [facility] Pager [phone]. If 7PM-7AM, please contact night-coverage at [email or web address], password [facility] 02/04/2014, 12:54 PM  LOS: 4 days

## 2014-02-04 NOTE — Progress Notes (Signed)
PT Cancellation Note  Patient Details Name: FARD BORUNDA MRN: [id number] DOB: 15-May-1951   Cancelled Treatment:    Reason Eval/Treat Not Completed: pt declined to participate at this time-requested PT check back another time. Will try to check back as schedule permits.    Weston Anna, MPT Pager: [phone]

## 2014-02-04 NOTE — Progress Notes (Signed)
Patient is still complaining of nausea and vomiting small amount intermittently. PEG tube feeding kept on hold.

## 2014-02-05 DIAGNOSIS — R0989 Other specified symptoms and signs involving the circulatory and respiratory systems: Secondary | ICD-10-CM

## 2014-02-05 LAB — GLUCOSE, CAPILLARY
GLUCOSE-CAPILLARY: 158 mg/dL — AB (ref 70–99)
GLUCOSE-CAPILLARY: 159 mg/dL — AB (ref 70–99)
GLUCOSE-CAPILLARY: 143 mg/dL — AB (ref 70–99)
Glucose-Capillary: 136 mg/dL — ABNORMAL HIGH (ref 70–99)
Glucose-Capillary: 140 mg/dL — ABNORMAL HIGH (ref 70–99)

## 2014-02-05 LAB — BASIC METABOLIC PANEL
Anion gap: 13 (ref 5–15)
BUN: 11 mg/dL (ref 6–23)
CO2: 21 meq/L (ref 19–32)
Calcium: 9 mg/dL (ref 8.4–10.5)
Chloride: 102 mEq/L (ref 96–112)
Creatinine, Ser: 0.92 mg/dL (ref 0.50–1.35)
GFR calc Af Amer: >90 mL/min (ref >90)
GFR calc non Af Amer: 87 mL/min — ABNORMAL LOW (ref >90)
GLUCOSE: 170 mg/dL — AB (ref 70–99)
Potassium: 3.8 mEq/L (ref 3.7–5.3)
SODIUM: 136 meq/L — AB (ref 137–147)

## 2014-02-05 LAB — CBC
HCT: 31.8 % — ABNORMAL LOW (ref 39.0–52.0)
HEMOGLOBIN: 10.7 g/dL — AB (ref 13.0–17.0)
MCH: 29.2 pg (ref 26.0–34.0)
MCHC: 33.6 g/dL (ref 30.0–36.0)
MCV: 86.6 fL (ref 78.0–100.0)
Platelets: 114 10*3/uL — ABNORMAL LOW (ref 150–400)
RBC: 3.67 MIL/uL — ABNORMAL LOW (ref 4.22–5.81)
RDW: 13.2 % (ref 11.5–15.5)
WBC: 1.8 10*3/uL — AB (ref 4.0–10.5)

## 2014-02-05 LAB — TYPE AND SCREEN
ABO/RH(D): A POS
Antibody Screen: NEGATIVE
UNIT DIVISION: 0

## 2014-02-05 MED ORDER — ADULT MULTIVITAMIN W/MINERALS CH
1.0000 | ORAL_TABLET | Freq: Every day | ORAL | Status: DC
  Administered 2014-02-06 – 2014-02-07 (×2): 1
  Filled 2014-02-05 (×2): qty 1

## 2014-02-05 MED ORDER — HYDROCODONE-ACETAMINOPHEN 7.5-325 MG/15ML PO SOLN
15.0000 mL | ORAL | Status: DC | PRN
  Administered 2014-02-06: 15 mL
  Filled 2014-02-05: qty 15

## 2014-02-05 MED ORDER — ACETAMINOPHEN 325 MG PO TABS: 650.0000 mg | ORAL_TABLET | Freq: Four times a day (QID) | ORAL | Status: DC | PRN

## 2014-02-05 MED ORDER — VITAMIN B-1 100 MG PO TABS
100.0000 mg | ORAL_TABLET | Freq: Every day | ORAL | Status: DC
  Administered 2014-02-06 – 2014-02-07 (×2): 100 mg
  Filled 2014-02-05 (×2): qty 1

## 2014-02-05 MED ORDER — TERAZOSIN HCL 1 MG PO CAPS
1.0000 mg | ORAL_CAPSULE | Freq: Every day | ORAL | Status: DC
  Administered 2014-02-06: 1 mg
  Filled 2014-02-05 (×2): qty 1

## 2014-02-05 MED ORDER — POLYETHYLENE GLYCOL 3350 17 G PO PACK
17.0000 g | PACK | Freq: Every day | ORAL | Status: DC | PRN
  Filled 2014-02-05: qty 1

## 2014-02-05 MED ORDER — JEVITY 1.2 CAL PO LIQD
1000.0000 mL | ORAL | Status: DC
  Administered 2014-02-05: 1000 mL
  Filled 2014-02-05 (×2): qty 1000

## 2014-02-05 MED ORDER — SUCRALFATE 1 GM/10ML PO SUSP
1.0000 g | Freq: Three times a day (TID) | ORAL | Status: DC
  Administered 2014-02-06 – 2014-02-07 (×5): 1 g
  Filled 2014-02-05 (×9): qty 10

## 2014-02-05 MED ORDER — ACETAMINOPHEN 650 MG RE SUPP: 650.0000 mg | Freq: Four times a day (QID) | RECTAL | Status: DC | PRN

## 2014-02-05 NOTE — Progress Notes (Signed)
[facility] PROGRESS NOTE  Michael Keith FIE:[id number] DOB: 15-Feb-1951 DOA: 01/31/2014 PCP: Philis Fendt, MD  HPI/Subjective: Feels better after he had multiple episodes of vomiting yesterday after tube feedings started. Abdominal x-ray showed no obstructive pattern, start slow trickle of tube feed.  Assessment/Plan:  Dehydration/orthostasis: due to poor PO intake -Continue IVF's -continue holding diuretics and antihypertensive agents -close follow up and reassessment of volume status -G-tube placed 8/6 laparoscopically by Dr. Hassell Done. -Patient developed some nausea/vomiting after tube feedings started yesterday. Feels better today.  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 -Per oncology, no evidence of fever, and she still >500, observe.  Chronic systolyc heart failure and CAD: compensated. No CP or SOB. -holding diuretics and ASA -continue daily weights -Strict I's and O's  COPD: compensated: continue PRN albuterol  Tobacco abuse: cessation counseling provided -started on nicotine patch  Severe protein calorie malnutrition: continue feeding supplements -CCS evaluating for feeding tube placement and hopefully better nutrition  Urinary retention -Likely patient has prostatic hypertrophy, start Flomax, in and out cath as needed. -Patient also getting narcotics which might cause urinary retention  DVT: lovenox   Code Status: Full Family Communication: no family at bedside Disposition Plan: to be determine   Consultants:  Oncology   CCS  Procedures:  None   Antibiotics:  None   Objective: Filed Vitals:   02/05/14 0350  BP: 129/63  Pulse: 53  Temp: 98.6 F (37 C)  Resp: 20     Intake/Output Summary (Last 24 hours) at 02/05/14 1201 Last data filed at 02/05/14 0801  Gross per 24 hour  Intake   2050 ml  Output    610 ml  Net   1440 ml   Filed Weights   02/03/14 0438 02/04/14 0523 02/05/14 0350  Weight: 48.172 kg (106 lb 3.2 oz) 49.896 kg (110 lb) 48.263 kg (106 lb 6.4 oz)    Exam:   General:  Frail, cachetic and no severe distress, afebrile and complaining of throat/upper mid chest discomfort  Cardiovascular: S1 and S2, regular rate; no rubs or gallops; no JVD  Respiratory: good air movement, no crackles or wheezing  Abdomen: soft, NT, ND, positive BS  Musculoskeletal: no cyanosis or edema  Data Reviewed: Basic Metabolic Panel:  Recent Labs Lab 01/31/14 1825 02/01/14 0412 02/02/14 0400 02/02/14 2050 02/03/14 0405 02/04/14 0607 02/05/14 0505  NA 137 137 143  --  142 138 136*  K 4.4 4.3 4.1  --  4.9 4.2 3.8  CL 102 104 111  --  110 108 102  CO2 23 22 19   --  18* 22 21  GLUCOSE 178* 85 72  --  84 152* 170*  BUN 51* 46* 27*  --  24* 20 11  CREATININE 1.65* 1.49* 1.18 1.18 1.19 0.98 0.92  CALCIUM 8.9 8.7 8.4  --  8.7 8.9 9.0  MG 2.4  --   --   --   --   --   --   PHOS 3.8  --   --   --   --   --   --    Liver Function Tests:  Recent Labs Lab 01/31/14 1825  AST 22  ALT 10  ALKPHOS 58  BILITOT 0.3  PROT 7.9  ALBUMIN 3.5   CBC:  Recent Labs Lab 01/31/14 1825  02/02/14 0400 02/02/14 2050 02/03/14 0405 02/04/14 0607 02/05/14 0505  WBC 1.5*  < > 1.2* 1.6* 1.7* 1.9* 1.8*  NEUTROABS 0.9*  --   --   --   --   --   --   HGB 9.2*  < > 8.3* 8.8* 7.8* 7.9* 10.7*  HCT 26.8*  < > 24.6* 26.5* 23.1* 24.0* 31.8*  MCV 85.6  < > 86.3 87.7 86.8 87.0 86.6  PLT 105*  < > 108* 100* 100* 113* 114*  < > = values in this interval not displayed. CBG:  Recent Labs Lab 02/04/14 0744 02/04/14 2010 02/04/14 2358 02/05/14 0357 02/05/14 0759  GLUCAP 129* 142* 154* 158* 159*    Recent Results (from the past 240 hour(s))  URINE CULTURE      Status: None   Collection Time    02/01/14  2:00 AM      Result Value Ref Range Status   Specimen Description URINE, RANDOM   Final   Special Requests NONE   Final   Culture  Setup Time     Final   Value: 02/01/2014 11:10     Performed at [place]     Final   Value: 4,000 COLONIES/ML     Performed at [facility]   Culture     Final   Value: INSIGNIFICANT GROWTH     Performed at [facility]   Report Status 02/02/2014 FINAL   Final  SURGICAL PCR SCREEN     Status: Abnormal   Collection Time    02/02/14  8:50 AM      Result Value Ref Range Status   MRSA, PCR INVALID RESULTS, SPECIMEN SENT FOR CULTURE (*) NEGATIVE Final   Comment: NOTIFIED C. OBERRY RN AT 1105 ON 08.05.15 BY SHUEA   Staphylococcus aureus INVALID RESULTS, SPECIMEN SENT FOR CULTURE (*) NEGATIVE Final   Comment: NOTIFIED C. OBERRY RN AT 3154 ON 08.05.15 BY SHUEA                The Xpert SA Assay (FDA     approved for NASAL specimens     in patients over 38 years of age),     is one component of     a comprehensive surveillance     program.  Test performance has     been validated by [employer] for patients greater     than or equal to 86 year old.     It is not intended     to diagnose infection nor to     guide or monitor treatment.  MRSA CULTURE     Status: None   Collection Time    02/02/14  8:50 AM      Result Value Ref Range Status   Specimen Description NOSE   Final   Special Requests NONE   Final   Culture     Final   Value: NO STAPHYLOCOCCUS AUREUS ISOLATED     Note: NOMRSA     Performed at [facility]   Report Status 02/04/2014 FINAL   Final     Studies: Dg Abd 2 Views  02/04/2014   CLINICAL DATA:  Nausea and vomiting  EXAM: ABDOMEN - 2 VIEW  COMPARISON:  None.  FINDINGS: Gastrostomy tube is in place. No free intraperitoneal gas on the decubitus image. Diffuse colonic distension is noted. Phleboliths project over the pelvis.  IMPRESSION:  Diffuse colonic distention.  No free intraperitoneal gas.   Electronically Signed   By: Maryclare Bean M.D.   On: 02/04/2014 11:12    Scheduled Meds: . docusate sodium  100 mg Oral BID  . feeding supplement (JEVITY 1.2 CAL)  1,000 mL Per Tube Q24H  . heparin  5,000 Units Subcutaneous 3 times per day  . multivitamin with minerals  1 tablet Oral Daily  . nicotine  14 mg Transdermal Daily  . pantoprazole (PROTONIX) IV  40 mg Intravenous Q12H  . sodium chloride  10-40 mL Intracatheter Q12H  . sodium chloride  3 mL Intravenous Q12H  . sucralfate  1 g Oral TID WC & HS  . terazosin  1 mg Oral QHS  . thiamine  100 mg Oral Daily   Continuous Infusions: . dextrose 5 % and 0.45% NaCl 100 mL/hr at 02/05/14 8786    Principal Problem:   Dehydration Active Problems:   Nonischemic cardiomyopathy   Tobacco abuse   COPD (chronic obstructive pulmonary disease)   CAD (coronary artery disease)   Chronic systolic CHF (congestive heart failure)   Dysphasia   Squamous cell esophageal cancer   Cancer of upper third of esophagus   Orthostasis   FTT (failure to thrive) in adult   Severe protein-calorie malnutrition    Time spent: >30 minutes    [place] [facility] Pager [phone]. If 7PM-7AM, please contact night-coverage at [email or web address], password [facility] 02/05/2014, 12:01 PM  LOS: 5 days

## 2014-02-05 NOTE — Progress Notes (Signed)
3 Days Post-Op  Subjective: Had some nausea and vomiting yesterday so tube feeds held. Feels better today. Passing some gas.  Had a BM yesterday.  Objective: Vital signs in last 24 hours: Temp:  [98.6 F (37 C)-99.6 F (37.6 C)] 98.6 F (37 C) (08/08 0350) Pulse Rate:  [53-90] 53 (08/08 0350) Resp:  [16-22] 20 (08/08 0350) BP: (118-131)/(63-74) 129/63 mmHg (08/08 0350) SpO2:  [96 %-99 %] 99 % (08/08 0350) Weight:  [106 lb 6.4 oz (48.263 kg)] 106 lb 6.4 oz (48.263 kg) (08/08 0350) Last BM Date: Mar 04, 2014  Intake/Output from previous day: 03/05/23 0701 - 08/08 0700 In: 2904.2 [I.V.:2591.7; Blood:312.5] Out: 760 [Urine:760] Intake/Output this shift: Total I/O In: -  Out: 50 [Urine:50]  PE: General- In NAD Abdomen-slight firm and distended, g-tube site clean, incisions clean, few bowel sounds  Lab Results:   Recent Labs  03/04/2014 0607 02/05/14 0505  WBC 1.9* 1.8*  HGB 7.9* 10.7*  HCT 24.0* 31.8*  PLT 113* 114*   BMET  Recent Labs  2014-03-04 0607 02/05/14 0505  NA 138 136*  K 4.2 3.8  CL 108 102  CO2 22 21  GLUCOSE 152* 170*  BUN 20 11  CREATININE 0.98 0.92  CALCIUM 8.9 9.0   PT/INR No results found for this basename: LABPROT, INR,  in the last 72 hours Comprehensive Metabolic Panel:    Component Value Date/Time   NA 136* 02/05/2014 0505   NA 138 03-04-2014 0607   NA 140 01/25/2014 1220   NA 137 01/18/2014 0859   K 3.8 02/05/2014 0505   K 4.2 Mar 04, 2014 0607   K 4.3 01/25/2014 1220   K 5.0 01/18/2014 0859   CL 102 02/05/2014 0505   CL 108 2014-03-04 0607   CO2 21 02/05/2014 0505   CO2 22 03/04/2014 0607   CO2 23 01/25/2014 1220   CO2 20* 01/18/2014 0859   BUN 11 02/05/2014 0505   BUN 20 03-04-2014 0607   BUN 40.9* 01/25/2014 1220   BUN 49.0* 01/18/2014 0859   CREATININE 0.92 02/05/2014 0505   CREATININE 0.98 03-04-14 0607   CREATININE 1.8* 01/25/2014 1220   CREATININE 2.1* 01/18/2014 0859   GLUCOSE 170* 02/05/2014 0505   GLUCOSE 152* Mar 04, 2014 0607   GLUCOSE 133 01/25/2014 1220    GLUCOSE 92 01/18/2014 0859   CALCIUM 9.0 02/05/2014 0505   CALCIUM 8.9 2014/03/04 0607   CALCIUM 9.6 01/25/2014 1220   CALCIUM 9.7 01/18/2014 0859   AST 22 01/31/2014 1825   AST 22 01/25/2014 1220   AST 19 01/18/2014 0859   AST 71* 04/25/2010 1219   ALT 10 01/31/2014 1825   ALT 10 01/25/2014 1220   ALT 9 01/18/2014 0859   ALT 95* 04/25/2010 1219   ALKPHOS 58 01/31/2014 1825   ALKPHOS 54 01/25/2014 1220   ALKPHOS 61 01/18/2014 0859   ALKPHOS 164* 04/25/2010 1219   BILITOT 0.3 01/31/2014 1825   BILITOT 0.50 01/25/2014 1220   BILITOT 0.52 01/18/2014 0859   BILITOT 1.2 04/25/2010 1219   PROT 7.9 01/31/2014 1825   PROT 8.1 01/25/2014 1220   PROT 8.3 01/18/2014 0859   PROT 7.5 04/25/2010 1219   ALBUMIN 3.5 01/31/2014 1825   ALBUMIN 3.8 01/25/2014 1220   ALBUMIN 3.9 01/18/2014 0859   ALBUMIN 2.7* 04/25/2010 1219     Studies/Results: Dg Abd 2 Views  03/04/14   CLINICAL DATA:  Nausea and vomiting  EXAM: ABDOMEN - 2 VIEW  COMPARISON:  None.  FINDINGS: Gastrostomy tube is in place.  No free intraperitoneal gas on the decubitus image. Diffuse colonic distension is noted. Phleboliths project over the pelvis.  IMPRESSION: Diffuse colonic distention.  No free intraperitoneal gas.   Electronically Signed   By: Maryclare Bean M.D.   On: 02/04/2014 11:12    Anti-infectives: Anti-infectives   Start     Dose/Rate Route Frequency Ordered Stop   02/02/14 0800  [MAR Hold]  ceFAZolin (ANCEF) IVPB 2 g/50 mL premix     (On MAR Hold since 02/02/14 1014)  Comments:  Pharmacy may adjust dosing strength, interval, or rate of medication as needed for optimal therapy for the patient  Send with patient on call to the OR.  Anesthesia to complete antibiotic administration <60min prior to incision per [facility].   2 g 100 mL/hr over 30 Minutes Intravenous On call to O.R. 02/02/14 0758 02/02/14 1130      Assessment  Status post laparoscopic g-tube placement:  Having some problems tube feeds.   Dehydration   Dysphagia   Squamous  cell esophageal cancer   Cancer of upper third of esophagus   Orthostasis   FTT (failure to thrive) in adult   Severe protein-calorie malnutrition    LOS: 5 days   Plan: Retry tube feeds today at 10cc/hr.   Tarissa Kerin J 02/05/2014

## 2014-02-05 NOTE — Progress Notes (Signed)
Pt complains of gas and hiccups. No complaints of nausea and vomiting. Gastric residual was 270 ml that was dark green. MD notified. Instructed to continue tube feeding at 10 ml/hr. Will continue to monitor.

## 2014-02-05 NOTE — Telephone Encounter (Signed)
Returned call to [place] re: patient's refusal for home visit to explain care and use of PEG.  I explained that referral was pre-mature as he has not yet had his PEG placed.  I indicated referral would be made at a later date.  She verbalized understanding.  [name], RN, BSN, [facility] Neck Oncology Navigator [phone]

## 2014-02-05 NOTE — Progress Notes (Signed)
Patient sleeping when I checked on him this afternoon.    [name], RN, BSN, [facility] Head [facility] Neck Oncology Navigator [phone]

## 2014-02-06 DIAGNOSIS — R059 Cough, unspecified: Secondary | ICD-10-CM

## 2014-02-06 DIAGNOSIS — R05 Cough: Secondary | ICD-10-CM

## 2014-02-06 DIAGNOSIS — I428 Other cardiomyopathies: Secondary | ICD-10-CM

## 2014-02-06 LAB — GLUCOSE, CAPILLARY
GLUCOSE-CAPILLARY: 142 mg/dL — AB (ref 70–99)
GLUCOSE-CAPILLARY: 152 mg/dL — AB (ref 70–99)
Glucose-Capillary: 144 mg/dL — ABNORMAL HIGH (ref 70–99)
Glucose-Capillary: 147 mg/dL — ABNORMAL HIGH (ref 70–99)
Glucose-Capillary: 126 mg/dL — ABNORMAL HIGH (ref 70–99)
Glucose-Capillary: 114 mg/dL — ABNORMAL HIGH (ref 70–99)

## 2014-02-06 MED ORDER — JEVITY 1.2 CAL PO LIQD
1000.0000 mL | ORAL | Status: DC
  Administered 2014-02-06 – 2014-02-07 (×2): 1000 mL
  Filled 2014-02-06 (×2): qty 1000

## 2014-02-06 NOTE — Plan of Care (Signed)
Problem: Phase II Progression Outcomes Goal: Tolerating diet Outcome: Progressing Jevity1.2 via peg tube@ 20 ml/hr

## 2014-02-06 NOTE — Progress Notes (Signed)
Pt vomited his meds crushed and put in applesauce. Sat up in chair for a couple hours, refused to ambulate states he is too tired, will try again later. Ambulating to the bathroom with standby assist.

## 2014-02-06 NOTE — Progress Notes (Signed)
4 Days Post-Op  Subjective: No nausea.  Feels less bloated this AM.  Objective: Vital signs in last 24 hours: Temp:  [98.5 F (36.9 C)-100.4 F (38 C)] 98.5 F (36.9 C) (08/09 0538) Pulse Rate:  [52-95] 55 (08/09 0538) Resp:  [16-18] 18 (08/09 0538) BP: (98-120)/(51-78) 98/51 mmHg (08/09 0538) SpO2:  [95 %-100 %] 99 % (08/09 0538) Weight:  [112 lb 14.4 oz (51.211 kg)] 112 lb 14.4 oz (51.211 kg) (08/09 0454) Last BM Date: 03-04-14  Intake/Output from previous day: 08/08 0701 - 08/09 0700 In: 3056.5 [I.V.:2270; NG/GT:786.5] Out: 985 [Urine:500; Drains:485] Intake/Output this shift:    PE: General- In NAD Abdomen-slight firm and distended, g-tube site clean, incisions clean, few bowel sounds  Lab Results:   Recent Labs  03/04/2014 0607 02/05/14 0505  WBC 1.9* 1.8*  HGB 7.9* 10.7*  HCT 24.0* 31.8*  PLT 113* 114*   BMET  Recent Labs  03-04-2014 0607 02/05/14 0505  NA 138 136*  K 4.2 3.8  CL 108 102  CO2 22 21  GLUCOSE 152* 170*  BUN 20 11  CREATININE 0.98 0.92  CALCIUM 8.9 9.0   PT/INR No results found for this basename: LABPROT, INR,  in the last 72 hours Comprehensive Metabolic Panel:    Component Value Date/Time   NA 136* 02/05/2014 0505   NA 138 2014/03/04 0607   NA 140 01/25/2014 1220   NA 137 01/18/2014 0859   K 3.8 02/05/2014 0505   K 4.2 04-Mar-2014 0607   K 4.3 01/25/2014 1220   K 5.0 01/18/2014 0859   CL 102 02/05/2014 0505   CL 108 03/04/2014 0607   CO2 21 02/05/2014 0505   CO2 22 03-04-14 0607   CO2 23 01/25/2014 1220   CO2 20* 01/18/2014 0859   BUN 11 02/05/2014 0505   BUN 20 04-Mar-2014 0607   BUN 40.9* 01/25/2014 1220   BUN 49.0* 01/18/2014 0859   CREATININE 0.92 02/05/2014 0505   CREATININE 0.98 2014-03-04 0607   CREATININE 1.8* 01/25/2014 1220   CREATININE 2.1* 01/18/2014 0859   GLUCOSE 170* 02/05/2014 0505   GLUCOSE 152* 03-04-2014 0607   GLUCOSE 133 01/25/2014 1220   GLUCOSE 92 01/18/2014 0859   CALCIUM 9.0 02/05/2014 0505   CALCIUM 8.9 03/04/2014 0607   CALCIUM  9.6 01/25/2014 1220   CALCIUM 9.7 01/18/2014 0859   AST 22 01/31/2014 1825   AST 22 01/25/2014 1220   AST 19 01/18/2014 0859   AST 71* 04/25/2010 1219   ALT 10 01/31/2014 1825   ALT 10 01/25/2014 1220   ALT 9 01/18/2014 0859   ALT 95* 04/25/2010 1219   ALKPHOS 58 01/31/2014 1825   ALKPHOS 54 01/25/2014 1220   ALKPHOS 61 01/18/2014 0859   ALKPHOS 164* 04/25/2010 1219   BILITOT 0.3 01/31/2014 1825   BILITOT 0.50 01/25/2014 1220   BILITOT 0.52 01/18/2014 0859   BILITOT 1.2 04/25/2010 1219   PROT 7.9 01/31/2014 1825   PROT 8.1 01/25/2014 1220   PROT 8.3 01/18/2014 0859   PROT 7.5 04/25/2010 1219   ALBUMIN 3.5 01/31/2014 1825   ALBUMIN 3.8 01/25/2014 1220   ALBUMIN 3.9 01/18/2014 0859   ALBUMIN 2.7* 04/25/2010 1219     Studies/Results: Dg Abd 2 Views  2014-03-04   CLINICAL DATA:  Nausea and vomiting  EXAM: ABDOMEN - 2 VIEW  COMPARISON:  None.  FINDINGS: Gastrostomy tube is in place. No free intraperitoneal gas on the decubitus image. Diffuse colonic distension is noted. Phleboliths project over the pelvis.  IMPRESSION: Diffuse colonic distention.  No free intraperitoneal gas.   Electronically Signed   By: Maryclare Bean M.D.   On: 02/04/2014 11:12    Anti-infectives: Anti-infectives   Start     Dose/Rate Route Frequency Ordered Stop   02/02/14 0800  [MAR Hold]  ceFAZolin (ANCEF) IVPB 2 g/50 mL premix     (On MAR Hold since 02/02/14 1014)  Comments:  Pharmacy may adjust dosing strength, interval, or rate of medication as needed for optimal therapy for the patient  Send with patient on call to the OR.  Anesthesia to complete antibiotic administration <52min prior to incision per [facility].   2 g 100 mL/hr over 30 Minutes Intravenous On call to O.R. 02/02/14 0758 02/02/14 1130      Assessment  Status post laparoscopic g-tube placement:  Tolerating TFs at 10 cc/hr.   Dehydration   Dysphagia   Squamous cell esophageal cancer   Cancer of upper third of esophagus   Orthostasis   FTT (failure to thrive)  in adult   Severe protein-calorie malnutrition    LOS: 6 days   Plan: Increase TFs to 20 cc/hr   Thadd Apuzzo J 02/06/2014

## 2014-02-06 NOTE — Progress Notes (Signed)
[facility] PROGRESS NOTE  ANCHOR DWAN CNO:[id number] DOB: 02-18-1951 DOA: 01/31/2014 PCP: Philis Fendt, MD  HPI/Subjective: Denies any abdominal pain, nausea or vomiting. Tolerated to feeding a 10 cc/hour okay. Had residual of 270 mL, most of it was gastric juices. Feels okay advanced to feeding to 20 mL/hour. Low-grade fever of 100.4 last night, afebrile today. If developed fever again we'll place on antibiotics.  Assessment/Plan:  Dehydration/orthostasis: due to poor PO intake -Continue IVF's -continue holding diuretics and antihypertensive agents -close follow up and reassessment of volume status -G-tube placed 8/6 laparoscopically by Dr. Hassell Done. -Patient developed some nausea/vomiting after tube feedings started initially. -Feels better, to feeding started at 10 and been advanced carefully.  Squamous cell esophageal cancer: actively receiving chemotherapy and radiation therapy -oncology on board, will follow rec's -plan is to continue radiation supposed to be on 8/4.  Dysphagia: secondary to cancer and most likely esophagitis from radiation -will continue PPI -start carafate  Pancytopenia: . -Per oncology, no evidence of fever, and she still >500, observe.  Chronic systolyc heart failure and CAD: compensated. No CP or SOB. -holding diuretics and ASA -continue daily weights -Strict I's and O's  COPD: compensated: continue PRN albuterol  Tobacco abuse: cessation counseling provided -started on nicotine patch  Severe protein calorie malnutrition: continue feeding supplements -CCS evaluating for feeding tube placement and hopefully better nutrition  Urinary retention -Likely patient has prostatic hypertrophy, start Flomax, in and out cath as needed. -Patient also getting narcotics which might cause urinary retention  DVT: lovenox   Code Status: Full Family Communication: no family at bedside Disposition Plan: to be determine   Consultants:  Oncology    CCS  Procedures:  None   Antibiotics:  None   Objective: Filed Vitals:   02/06/14 0538  BP: 98/51  Pulse: 55  Temp: 98.5 F (36.9 C)  Resp: 18    Intake/Output Summary (Last 24 hours) at 02/06/14 1135 Last data filed at 02/06/14 0640  Gross per 24 hour  Intake 3162.84 ml  Output   1135 ml  Net 2027.84 ml   Filed Weights   02/04/14 0523 02/05/14 0350 02/06/14 0454  Weight: 49.896 kg (110 lb) 48.263 kg (106 lb 6.4 oz) 51.211 kg (112 lb 14.4 oz)    Exam:   General:  Frail, cachetic and no severe distress, afebrile and complaining of throat/upper mid chest discomfort  Cardiovascular: S1 and S2, regular rate; no rubs or gallops; no JVD  Respiratory: good air movement, no crackles or wheezing  Abdomen: soft, NT, ND, positive BS  Musculoskeletal: no cyanosis or edema  Data Reviewed: Basic Metabolic Panel:  Recent Labs Lab 01/31/14 1825 02/01/14 0412 02/02/14 0400 02/02/14 2050 02/03/14 0405 02/04/14 0607 02/05/14 0505  NA 137 137 143  --  142 138 136*  K 4.4 4.3 4.1  --  4.9 4.2 3.8  CL 102 104 111  --  110 108 102  CO2 23 22 19   --  18* 22 21  GLUCOSE 178* 85 72  --  84 152* 170*  BUN 51* 46* 27*  --  24* 20 11  CREATININE 1.65* 1.49* 1.18 1.18 1.19 0.98 0.92  CALCIUM 8.9 8.7 8.4  --  8.7 8.9 9.0  MG 2.4  --   --   --   --   --   --   PHOS 3.8  --   --   --   --   --   --    Liver Function Tests:  Recent Labs Lab 01/31/14 1825  AST 22  ALT 10  ALKPHOS 58  BILITOT 0.3  PROT 7.9  ALBUMIN 3.5   CBC:  Recent Labs Lab 01/31/14 1825  02/02/14 0400 02/02/14 2050 02/03/14 0405 02/04/14 0607 02/05/14 0505  WBC 1.5*  < > 1.2* 1.6* 1.7* 1.9* 1.8*  NEUTROABS 0.9*  --   --   --   --   --   --   HGB 9.2*  < > 8.3* 8.8* 7.8* 7.9* 10.7*  HCT 26.8*  < > 24.6* 26.5* 23.1* 24.0* 31.8*  MCV 85.6  < > 86.3 87.7 86.8 87.0 86.6  PLT 105*  < > 108* 100* 100* 113* 114*  < > = values in this interval not displayed. CBG:  Recent Labs Lab  02/05/14 1815 02/05/14 2023 02/06/14 0016 02/06/14 0455 02/06/14 0813  GLUCAP 136* 140* 152* 142* 144*    Recent Results (from the past 240 hour(s))  URINE CULTURE     Status: None   Collection Time    02/01/14  2:00 AM      Result Value Ref Range Status   Specimen Description URINE, RANDOM   Final   Special Requests NONE   Final   Culture  Setup Time     Final   Value: 02/01/2014 11:10     Performed at [facility]     Final   Value: 4,000 COLONIES/ML     Performed at [facility]   Culture     Final   Value: INSIGNIFICANT GROWTH     Performed at [facility]   Report Status 02/02/2014 FINAL   Final  SURGICAL PCR SCREEN     Status: Abnormal   Collection Time    02/02/14  8:50 AM      Result Value Ref Range Status   MRSA, PCR INVALID RESULTS, SPECIMEN SENT FOR CULTURE (*) NEGATIVE Final   Comment: NOTIFIED C. OBERRY RN AT 1105 ON 08.05.15 BY SHUEA   Staphylococcus aureus INVALID RESULTS, SPECIMEN SENT FOR CULTURE (*) NEGATIVE Final   Comment: NOTIFIED C. OBERRY RN AT 8299 ON 08.05.15 BY SHUEA                The Xpert SA Assay (FDA     approved for NASAL specimens     in patients over 22 years of age),     is one component of     a comprehensive surveillance     program.  Test performance has     been validated by [employer] for patients greater     than or equal to 24 year old.     It is not intended     to diagnose infection nor to     guide or monitor treatment.  MRSA CULTURE     Status: None   Collection Time    02/02/14  8:50 AM      Result Value Ref Range Status   Specimen Description NOSE   Final   Special Requests NONE   Final   Culture     Final   Value: NO STAPHYLOCOCCUS AUREUS ISOLATED     Note: NOMRSA     Performed at [facility]   Report Status 02/04/2014 FINAL   Final     Studies: No results found.  Scheduled Meds: . docusate sodium  100 mg Oral BID  . feeding supplement (JEVITY 1.2 CAL)   1,000  mL Per Tube Q24H  . heparin  5,000 Units Subcutaneous 3 times per day  . multivitamin with minerals  1 tablet Per Tube Daily  . nicotine  14 mg Transdermal Daily  . pantoprazole (PROTONIX) IV  40 mg Intravenous Q12H  . sodium chloride  10-40 mL Intracatheter Q12H  . sodium chloride  3 mL Intravenous Q12H  . sucralfate  1 g Per Tube TID WC & HS  . terazosin  1 mg Per Tube QHS  . thiamine  100 mg Per Tube Daily   Continuous Infusions: . dextrose 5 % and 0.45% NaCl 100 mL/hr at 02/06/14 0216    Principal Problem:   Dehydration Active Problems:   Nonischemic cardiomyopathy   Tobacco abuse   COPD (chronic obstructive pulmonary disease)   CAD (coronary artery disease)   Chronic systolic CHF (congestive heart failure)   Dysphasia   Squamous cell esophageal cancer   Cancer of upper third of esophagus   Orthostasis   FTT (failure to thrive) in adult   Severe protein-calorie malnutrition    Time spent: >30 minutes    [facility] Pager [phone]. If 7PM-7AM, please contact night-coverage at [email or web address], password [email or web address] 02/06/2014, 11:35 AM  LOS: 6 days

## 2014-02-07 ENCOUNTER — Encounter: Payer: Self-pay | Admitting: *Deleted

## 2014-02-07 ENCOUNTER — Ambulatory Visit
Admission: RE | Admit: 2014-02-07 | Discharge: 2014-02-07 | Disposition: A | Discharge status: Home / Self Care | Payer: Medicare Other | Source: Ambulatory Visit | Attending: Radiation Oncology | Admitting: Radiation Oncology

## 2014-02-07 DIAGNOSIS — D709 Neutropenia, unspecified: Secondary | ICD-10-CM

## 2014-02-07 DIAGNOSIS — E876 Hypokalemia: Secondary | ICD-10-CM

## 2014-02-07 DIAGNOSIS — C159 Malignant neoplasm of esophagus, unspecified: Secondary | ICD-10-CM

## 2014-02-07 DIAGNOSIS — C153 Malignant neoplasm of upper third of esophagus: Secondary | ICD-10-CM

## 2014-02-07 DIAGNOSIS — R7989 Other specified abnormal findings of blood chemistry: Secondary | ICD-10-CM

## 2014-02-07 DIAGNOSIS — Z510 Encounter for antineoplastic radiation therapy: Secondary | ICD-10-CM | POA: Diagnosis not present

## 2014-02-07 LAB — COMPREHENSIVE METABOLIC PANEL
ALT: 13 U/L (ref 0–53)
AST: 28 U/L (ref 0–37)
Albumin: 2.5 g/dL — ABNORMAL LOW (ref 3.5–5.2)
Alkaline Phosphatase: 52 U/L (ref 39–117)
Anion gap: 14 (ref 5–15)
BUN: 10 mg/dL (ref 6–23)
CALCIUM: 8.2 mg/dL — AB (ref 8.4–10.5)
CO2: 18 meq/L — AB (ref 19–32)
Chloride: 105 mEq/L (ref 96–112)
Creatinine, Ser: 1.03 mg/dL (ref 0.50–1.35)
GFR calc Af Amer: 87 mL/min — ABNORMAL LOW (ref >90)
GFR, EST NON AFRICAN AMERICAN: 75 mL/min — AB (ref >90)
Glucose, Bld: 142 mg/dL — ABNORMAL HIGH (ref 70–99)
Potassium: 3 mEq/L — ABNORMAL LOW (ref 3.7–5.3)
Sodium: 137 mEq/L (ref 137–147)
Total Bilirubin: 0.2 mg/dL — ABNORMAL LOW (ref 0.3–1.2)
Total Protein: 6.5 g/dL (ref 6.0–8.3)

## 2014-02-07 LAB — CBC
HCT: 26.9 % — ABNORMAL LOW (ref 39.0–52.0)
Hemoglobin: 9.2 g/dL — ABNORMAL LOW (ref 13.0–17.0)
MCH: 29.2 pg (ref 26.0–34.0)
MCHC: 34.2 g/dL (ref 30.0–36.0)
MCV: 85.4 fL (ref 78.0–100.0)
PLATELETS: 117 10*3/uL — AB (ref 150–400)
RBC: 3.15 MIL/uL — ABNORMAL LOW (ref 4.22–5.81)
RDW: 13.8 % (ref 11.5–15.5)
WBC: 3.6 10*3/uL — AB (ref 4.0–10.5)

## 2014-02-07 LAB — GLUCOSE, CAPILLARY
GLUCOSE-CAPILLARY: 133 mg/dL — AB (ref 70–99)
GLUCOSE-CAPILLARY: 159 mg/dL — AB (ref 70–99)
Glucose-Capillary: 129 mg/dL — ABNORMAL HIGH (ref 70–99)
Glucose-Capillary: 148 mg/dL — ABNORMAL HIGH (ref 70–99)

## 2014-02-07 LAB — MAGNESIUM: MAGNESIUM: 1.5 mg/dL (ref 1.5–2.5)

## 2014-02-07 MED ORDER — TERAZOSIN HCL 1 MG PO CAPS: 1.0000 mg | ORAL_CAPSULE | Freq: Every day | ORAL | Status: DC

## 2014-02-07 MED ORDER — HYDROCODONE-ACETAMINOPHEN 7.5-325 MG/15ML PO SOLN: 15.0000 mL | Freq: Four times a day (QID) | ORAL | Status: DC | PRN

## 2014-02-07 MED ORDER — KCL IN DEXTROSE-NACL 20-5-0.9 MEQ/L-%-% IV SOLN
INTRAVENOUS | Status: DC
  Administered 2014-02-07: 11:00:00 via INTRAVENOUS
  Filled 2014-02-07 (×2): qty 1000

## 2014-02-07 MED ORDER — OMEPRAZOLE-SODIUM BICARBONATE 40-1680 MG PO PACK: 1.0000 | PACK | Freq: Every day | ORAL | Status: DC

## 2014-02-07 MED ORDER — VITAMIN B-1 100 MG PO TABS: 100.0000 mg | ORAL_TABLET | Freq: Every day | ORAL | Status: DC

## 2014-02-07 MED ORDER — KCL IN DEXTROSE-NACL 40-5-0.9 MEQ/L-%-% IV SOLN
INTRAVENOUS | Status: DC
  Filled 2014-02-07: qty 1000

## 2014-02-07 MED ORDER — ADULT MULTIVITAMIN W/MINERALS CH: 1.0000 | ORAL_TABLET | Freq: Every day | ORAL | Status: DC

## 2014-02-07 MED ORDER — METOCLOPRAMIDE HCL 5 MG PO TABS
5.0000 mg | ORAL_TABLET | Freq: Three times a day (TID) | ORAL | Status: DC | PRN
Start: 2014-02-07 — End: 2014-02-27

## 2014-02-07 MED ORDER — JEVITY 1.2 CAL PO LIQD: 20.0000 mL/h | ORAL | Status: DC

## 2014-02-07 MED ORDER — BIAFINE EX EMUL
Freq: Two times a day (BID) | CUTANEOUS | Status: DC
  Administered 2014-02-07: 1 via TOPICAL

## 2014-02-07 MED ORDER — ASPIRIN 81 MG PO TABS: 81.0000 mg | ORAL_TABLET | Freq: Every day | ORAL | Status: DC

## 2014-02-07 MED ORDER — POTASSIUM CHLORIDE 20 MEQ/15ML (10%) PO LIQD
60.0000 meq | Freq: Three times a day (TID) | ORAL | Status: DC
  Administered 2014-02-07: 60 meq
  Filled 2014-02-07 (×2): qty 45

## 2014-02-07 MED ORDER — BIAFINE EX EMUL: Freq: Two times a day (BID) | CUTANEOUS | Status: DC

## 2014-02-07 MED ORDER — HEPARIN SOD (PORK) LOCK FLUSH 100 UNIT/ML IV SOLN
500.0000 [IU] | INTRAVENOUS | Status: AC | PRN
  Administered 2014-02-07: 500 [IU]

## 2014-02-07 MED ORDER — ONDANSETRON 4 MG PO TBDP: 4.0000 mg | ORAL_TABLET | Freq: Three times a day (TID) | ORAL | Status: DC | PRN

## 2014-02-07 MED ORDER — POTASSIUM CHLORIDE 20 MEQ/15ML (10%) PO LIQD
20.0000 meq | Freq: Three times a day (TID) | ORAL | Status: DC
  Administered 2014-02-07: 20 meq
  Filled 2014-02-07 (×3): qty 15

## 2014-02-07 NOTE — Progress Notes (Signed)
Weekly rad txs upper esophagus 13/30 txs completd, restarted patient Jevity 1.2  Feeding via peg tube at 68ml/hr, flushed firt with water, patient denies pain or nauasea, checked tongue and  Moist, taking po pills crushed and through his peg tube stated, nothing by mouth, , asked if he was getting oral care, "No one  Is doing that, no toobrush", gave patient soft toothbrush and sugessted he ask for toothpaste and to brush gums, teeth and mouth/tongue daily ot bid, patient has IVF's ongoping, peg tube intact, patient in w/c,weak 2:47 PM  2:47 PM

## 2014-02-07 NOTE — Progress Notes (Signed)
To provide support, encouragement and care continuity, met with patient during weekly [place] appt with Dr. Isidore Moos.  Pt reported improved tolerance of tube feeding over weekend, acknowledged that he is being [place] to [facility], understands that [facility] will provide him transportation for daily RT.  Initiating navigation as L2 patient (treatments established) with this encounter.  [name], RN, BSN, [facility] Head [facility] Neck Oncology Navigator [phone]

## 2014-02-07 NOTE — Progress Notes (Signed)
IP PROGRESS NOTE  Subjective:   Patient in more pain this am. Had good night per his report. His pain around the PEG tube.  No nausea or vomiting.    Objective:  Vital signs in last 24 hours: Temp:  [97.9 F (36.6 C)-98.5 F (36.9 C)] 98.5 F (36.9 C) (08/10 0432) Pulse Rate:  [51-79] 65 (08/10 0528) Resp:  [18-19] 19 (08/10 0528) BP: (123-138)/(48-89) 125/89 mmHg (08/10 0432) SpO2:  [99 %-100 %] 100 % (08/10 0528) Weight:  [110 lb 14.4 oz (50.304 kg)] 110 lb 14.4 oz (50.304 kg) (08/10 0432) Weight change: -2 lb (-0.907 kg) Last BM Date: 02/06/14  Intake/Output from previous day: 08/09 0701 - 08/10 0700 In: 3197.3 [P.O.:320; I.V.:2446.3; NG/GT:431] Out: 450 [Urine:400; Drains:50]  Sleepy but responsive. In mild distress this am.  Mouth: mucous membranes moist, pharynx normal without lesions Resp: clear to auscultation bilaterally Cardio: regular rate and rhythm, S1, S2 normal, no murmur, click, rub or gallop GI: soft, non-tender; bowel sounds normal; no masses,  no organomegaly Extremities: extremities normal, atraumatic, no cyanosis or edema  Portacath without erythema  Lab Results:  Recent Labs  02/05/14 0505 02/07/14 0440  WBC 1.8* 3.6*  HGB 10.7* 9.2*  HCT 31.8* 26.9*  PLT 114* 117*    BMET  Recent Labs  02/05/14 0505 02/07/14 0440  NA 136* 137  K 3.8 3.0*  CL 102 105  CO2 21 18*  GLUCOSE 170* 142*  BUN 11 10  CREATININE 0.92 1.03  CALCIUM 9.0 8.2*    Medications: I have reviewed the patient's current medications.  Assessment/Plan:  63 year old gentleman with the following issues:  1. 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 He is currently receiving radiation therapy concomitantly with carboplatin and Taxol. Chemotherapy was held last week given his counts and neutropenia. He is not ready to resume chemotherapy this week yet. I will assess him and decide on that as the week progresses.   2. Dehydration and failure to thrive: improved  with feeding tube placement.   3. Neutropenia: Related to chemotherapy. Resolving at this time. No sepsis noted.   4. Increased BUN and creatinine: Related to prerenal azotemia and is responded to hydration.   LOS: 7 days   BWLSLH,TDSKA 02/07/2014, 8:50 AM

## 2014-02-07 NOTE — Progress Notes (Addendum)
5 Days Post-Op  Subjective: Complains of being SOB, some abdominal discomfort, thinks he's going to be nauseated.  Objective: Vital signs in last 24 hours: Temp:  [97.9 F (36.6 C)-98.5 F (36.9 C)] 98.5 F (36.9 C) (08/10 0432) Pulse Rate:  [51-79] 65 (08/10 0528) Resp:  [18-19] 19 (08/10 0528) BP: (123-138)/(48-89) 125/89 mmHg (08/10 0432) SpO2:  [99 %-100 %] 100 % (08/10 0528) Weight:  [50.304 kg (110 lb 14.4 oz)] 50.304 kg (110 lb 14.4 oz) (08/10 0432) Last BM Date: 02/06/14 320 Po recorded/431 from the NG  Diet: clears 4 stools yesterday and one this AM recorded. Afebrile, VSS K+ 3.0 WBC is up H/h imporved Intake/Output from previous day: 08/09 0701 - 08/10 0700 In: 3197.3 [P.O.:320; I.V.:2446.3; NG/GT:431] Out: 450 [Urine:400; Drains:50] Intake/Output this shift:    General appearance: alert, cooperative, no distress and appears tired and worn out. Resp: few wheezes, BS down in the bases. GI: port sites look fine, his gastrostomy site looks good.    Lab Results:   Recent Labs  02/05/14 0505 02/07/14 0440  WBC 1.8* 3.6*  HGB 10.7* 9.2*  HCT 31.8* 26.9*  PLT 114* 117*    BMET  Recent Labs  02/05/14 0505 02/07/14 0440  NA 136* 137  K 3.8 3.0*  CL 102 105  CO2 21 18*  GLUCOSE 170* 142*  BUN 11 10  CREATININE 0.92 1.03  CALCIUM 9.0 8.2*   PT/INR No results found for this basename: LABPROT, INR,  in the last 72 hours   Recent Labs Lab 01/31/14 1825 02/07/14 0440  AST 22 28  ALT 10 13  ALKPHOS 58 52  BILITOT 0.3 0.2*  PROT 7.9 6.5  ALBUMIN 3.5 2.5*     Lipase     Component Value Date/Time   LIPASE 33 10/24/2009 1908     Studies/Results: No results found.  Medications: . docusate sodium  100 mg Oral BID  . feeding supplement (JEVITY 1.2 CAL)  1,000 mL Per Tube Q24H  . heparin  5,000 Units Subcutaneous 3 times per day  . multivitamin with minerals  1 tablet Per Tube Daily  . nicotine  14 mg Transdermal Daily  . pantoprazole  (PROTONIX) IV  40 mg Intravenous Q12H  . sodium chloride  10-40 mL Intracatheter Q12H  . sodium chloride  3 mL Intravenous Q12H  . sucralfate  1 g Per Tube TID WC & HS  . terazosin  1 mg Per Tube QHS  . thiamine  100 mg Per Tube Daily    Assessment/Plan Squamous cell carcinoma of the esophagus, s/p Laparoscopic gastrostomy tube placement, 02/02/2014, Pedro Earls, MD  1. Dehydration/orthostasis  2. 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 Dysphagia secondary to cancer.  4. CAD/hx of CM EF 81-85%/UD of systolic CHF/ Dr. Cleatis Polka - Cardiology  5. Hx of ETOH/tobacco abuse  6. Hx of chronic pancreatitis  7. COPD  8. PCM/deconditioning  9. CHRONIC renal disease  10. Pancytopenia (WBC1.3, H/H 8.7/25.8, platelets 105K)    Plan:  He still feels very weak, he has not been mobilized.  Last PT visit was on 8/7 and he declined.  He says he has not walked.  His K+ is low.  I would replace his K+ and continue to increase tube feeds as he tolerates it.  I think from a surgical standpoint the tube is working well.  I have added some K+ to his IV and per tube.  LOS: 7 days  JENNINGS,WILLARD 02/07/2014  Agree with above. Discussed with Dr. Hartford Poli about going nursing facility today.  Alphonsa Overall, MD, [facility] Surgery Pager: [phone] Office phone:  [phone]

## 2014-02-07 NOTE — Discharge Summary (Addendum)
Physician Discharge Summary  KOURY RODDY GYI:[id number] DOB: 1951-05-25 DOA: 01/31/2014  PCP: Philis Fendt, MD  Admit date: 01/31/2014 Discharge date: 02/07/2014  Time spent: 40 minutes  Recommendations for Outpatient Follow-up:  1. Followup with Dr. Alen Blew in 1 week. 2. To be followed by dietitian in the nursing home for further advancement of his tube feeding  Discharge Diagnoses:  Principal Problem:   Dehydration Active Problems:   Nonischemic cardiomyopathy   Tobacco abuse   COPD (chronic obstructive pulmonary disease)   CAD (coronary artery disease)   Chronic systolic CHF (congestive heart failure)   Dysphasia   Squamous cell esophageal cancer   Cancer of upper third of esophagus   Orthostasis   FTT (failure to thrive) in adult   Severe protein-calorie malnutrition   Discharge Condition: Stable  Diet recommendation: Soft diet  Filed Weights   02/05/14 0350 02/06/14 0454 02/07/14 0432  Weight: 48.263 kg (106 lb 6.4 oz) 51.211 kg (112 lb 14.4 oz) 50.304 kg (110 lb 14.4 oz)    History of present illness:  Michael Keith is a 63 y.o. male  African American gentleman with history of chronic pancreatitis, nonischemic cardiomyopathy, chronic systolic CHF, history of tobacco abuse, COPD, coronary artery disease, squamous cell esophageal cancer undergoing chemotherapy and radiation who was receiving radiation therapy when it was noted that her pulse could not be obtained it was noted to be dehydrated with systolic blood pressures in the 90s. Will call to directly admit the patient for further evaluation and management.  Patient states has lost about 3 pounds over the past weekend. Patient endorses poor oral intake and only able to tolerate liquids. Patient endorses some chills and generalized weakness. Patient also endorses a cough of clear sputum. Patient denies any fevers, no nausea, no vomiting, no chest pain, no shortness of breath, no lightheadedness, no dizziness, no syncope,  no presyncope, no abdominal pain, no dysuria, no melena, no hematemesis, no hematochezia, no hemoptysis. Patient does state that ever since starting radiation is at increased urine output. Patient also endorses poor oral intake.  Patient states he was evaluated by general surgery for feeding tube placement which is currently pending. Patient stated that he was seen by cardiology, Dr. Ron Parker for preop clearance and was told he was clear to undergo feeding tube placement.  Hospital Course:   Dehydration/orthostasis: due to poor PO intake  -Patient presented to the hospital with acute on chronic renal failure with creatinine of 1.65. -His Lasix discontinued, patient started on aggressive hydration with IV fluids. -Patient has poor oral intake secondary to odynophagia/dysphagia. -G-tube placed 8/6 laparoscopically by Dr. Hassell Done.  -Patient developed some nausea/vomiting after tube feedings started initially.  -Feels better, tube feeding at 20 mL/hour, tolerated that very well, advanced slowly. -Patient will be discharged to nursing home, to feeding should be advanced carefully to reach his dietary needs.  Squamous cell esophageal cancer: actively receiving chemotherapy and radiation therapy  -oncology on board, will follow rec's  -Seen by oncology, decided to hold on chemotherapy until he gets better and his nutritional status improves.  Dysphagia: secondary to cancer and most likely esophagitis from radiation  -Discharged on tube feeding, started also on omeprazole (Zegerid)  Pancytopenia: .  -Presented initially with pancytopenia, and his blood counts improves.  Chronic systolyc heart failure and CAD: compensated. No CP or SOB.  -holding diuretics and ASA  -continue daily weights  -Strict I's and O's   COPD: compensated: continue PRN albuterol   Tobacco abuse: cessation counseling  provided  -started on nicotine patch   Severe protein calorie malnutrition: continue feeding supplements   -Started on tube feeding, patient initially had vomiting but started to tolerate it. -Advance tube feeding very slowly.  Urinary retention  -Likely patient has prostatic hypertrophy, start Hytrin. -Patient also getting narcotics which might cause urinary retention      Procedures:  Placement of gastric tube done laparoscopically by Dr. Hassell Done on 02/02/2014.  Consultations:  General surgery.  Oncology.  Discharge Exam: Filed Vitals:   02/07/14 0528  BP:   Pulse: 65  Temp:   Resp: 19   General: Alert and awake, oriented x3, not in any acute distress. HEENT: anicteric sclera, pupils reactive to light and accommodation, EOMI CVS: S1-S2 clear, no murmur rubs or gallops Chest: clear to auscultation bilaterally, no wheezing, rales or rhonchi Abdomen: soft nontender, nondistended, normal bowel sounds, no organomegaly Extremities: no cyanosis, clubbing or edema noted bilaterally Neuro: Cranial nerves II-XII intact, no focal neurological deficits  Discharge Instructions You were cared for by a hospitalist during your hospital stay. If you have any questions about your discharge medications or the care you received while you were in the hospital after you are discharged, you can call the unit and asked to speak with the hospitalist on call if the hospitalist that took care of you is not available. Once you are discharged, your primary care physician will handle any further medical issues. Please note that NO REFILLS for any discharge medications will be authorized once you are discharged, as it is imperative that you return to your primary care physician (or establish a relationship with a primary care physician if you do not have one) for your aftercare needs so that they can reassess your need for medications and monitor your lab values.      Discharge Instructions   Increase activity slowly    Complete by:  As directed             Medication List    STOP taking these  medications       furosemide 40 MG tablet  Commonly known as:  LASIX      TAKE these medications       aspirin 81 MG tablet  Place 1 tablet (81 mg total) into feeding tube daily.     emollient cream  Commonly known as:  BIAFINE  Apply topically as needed (radiation side effects.).     feeding supplement (JEVITY 1.2 CAL) Liqd  Place 20 mL/hr into feeding tube continuous.     HYDROcodone-acetaminophen 7.5-325 mg/15 ml solution  Commonly known as:  HYCET  Place 15 mLs into feeding tube every 6 (six) hours as needed.     lidocaine-prilocaine cream  Commonly known as:  EMLA  Apply 1 application topically as needed (port access.).     metoCLOPramide 5 MG tablet  Commonly known as:  REGLAN  Take 1 tablet (5 mg total) by mouth every 8 (eight) hours as needed for nausea.     multivitamin with minerals Tabs tablet  Place 1 tablet into feeding tube daily.     nitroGLYCERIN 0.4 MG SL tablet  Commonly known as:  NITROSTAT  Place 0.4 mg under the tongue every 5 (five) minutes as needed for chest pain.     Omeprazole-Sodium Bicarbonate 40-1680 MG Pack  Place 1 Package into feeding tube daily.     ondansetron 4 MG disintegrating tablet  Commonly known as:  ZOFRAN ODT  Take 1 tablet (4 mg total) by  mouth every 8 (eight) hours as needed for nausea or vomiting.     terazosin 1 MG capsule  Commonly known as:  HYTRIN  Place 1 capsule (1 mg total) into feeding tube at bedtime.     thiamine 100 MG tablet  Commonly known as:  VITAMIN B-1  Place 1 tablet (100 mg total) into feeding tube daily.       No Known Allergies    The results of significant diagnostics from this hospitalization (including imaging, microbiology, ancillary and laboratory) are listed below for reference.    Significant Diagnostic Studies: Ir Fluoro Guide Cv Line Right  01/19/2014   CLINICAL DATA:  Esophageal carcinoma and need for porta cath to begin chemotherapy.  EXAM: IMPLANTED PORT A CATH PLACEMENT WITH  ULTRASOUND AND FLUOROSCOPIC GUIDANCE  ANESTHESIA/SEDATION: 1.0 Mg IV Versed; 50 mcg IV Fentanyl  Total Moderate Sedation Time:  45 minutes  Additional Medications: 2 g IV Ancef. As antibiotic prophylaxis, Ancef was ordered pre-procedure and administered intravenously within one hour of incision.  FLUOROSCOPY TIME:  18 seconds.  PROCEDURE: The procedure, risks, benefits, and alternatives were explained to the patient. Questions regarding the procedure were encouraged and answered. The patient understands and consents to the procedure.  The right neck and chest were prepped with chlorhexidine in a sterile fashion, and a sterile drape was applied covering the operative field. Maximum barrier sterile technique with sterile gowns and gloves were used for the procedure. Local anesthesia was provided with 1% lidocaine. Ultrasound was used to confirm patency of the right internal jugular vein.  After creating a small venotomy incision, a 21 gauge needle was advanced into the right internal jugular vein under direct, real-time ultrasound guidance. Ultrasound image documentation was performed. After securing guidewire access, an 8 Fr dilator was placed. A J-wire was kinked to measure appropriate catheter length.  A subcutaneous port pocket was then created along the upper chest wall utilizing sharp and blunt dissection. Portable cautery was utilized. The pocket was irrigated with sterile saline.  A single lumen power injectable port was chosen for placement. The 8 Fr catheter was tunneled from the port pocket site to the venotomy incision. The port was placed in the pocket. External catheter was trimmed to appropriate length based on guidewire measurement.  At the venotomy, an 8 Fr peel-away sheath was placed over a guidewire. The catheter was then placed through the sheath and the sheath removed. Final catheter positioning was confirmed and documented with a fluoroscopic spot image. The port was accessed with a needle and  aspirated and flushed with heparinized saline. The needle was removed.  The venotomy and port pocket incisions were closed with subcutaneous 3-0 Monocryl and subcuticular 4-0 Vicryl. Dermabond was applied to both incisions.  Complications: None. No pneumothorax.  FINDINGS: After catheter placement, the tip lies at the cavoatrial junction. The catheter aspirates normally and is ready for immediate use.  IMPRESSION: Placement of single lumen port a cath via right internal jugular vein. The catheter tip lies at the cavoatrial junction. A power injectable port a cath was placed and is ready for immediate use.   Electronically Signed   By: Aletta Edouard M.D.   On: 01/19/2014 17:23   Ir US Guide Vasc Access Right  01/19/2014   CLINICAL DATA:  Esophageal carcinoma and need for porta cath to begin chemotherapy.  EXAM: IMPLANTED PORT A CATH PLACEMENT WITH ULTRASOUND AND FLUOROSCOPIC GUIDANCE  ANESTHESIA/SEDATION: 1.0 Mg IV Versed; 50 mcg IV Fentanyl  Total Moderate Sedation Time:  45 minutes  Additional Medications: 2 g IV Ancef. As antibiotic prophylaxis, Ancef was ordered pre-procedure and administered intravenously within one hour of incision.  FLUOROSCOPY TIME:  18 seconds.  PROCEDURE: The procedure, risks, benefits, and alternatives were explained to the patient. Questions regarding the procedure were encouraged and answered. The patient understands and consents to the procedure.  The right neck and chest were prepped with chlorhexidine in a sterile fashion, and a sterile drape was applied covering the operative field. Maximum barrier sterile technique with sterile gowns and gloves were used for the procedure. Local anesthesia was provided with 1% lidocaine. Ultrasound was used to confirm patency of the right internal jugular vein.  After creating a small venotomy incision, a 21 gauge needle was advanced into the right internal jugular vein under direct, real-time ultrasound guidance. Ultrasound image documentation  was performed. After securing guidewire access, an 8 Fr dilator was placed. A J-wire was kinked to measure appropriate catheter length.  A subcutaneous port pocket was then created along the upper chest wall utilizing sharp and blunt dissection. Portable cautery was utilized. The pocket was irrigated with sterile saline.  A single lumen power injectable port was chosen for placement. The 8 Fr catheter was tunneled from the port pocket site to the venotomy incision. The port was placed in the pocket. External catheter was trimmed to appropriate length based on guidewire measurement.  At the venotomy, an 8 Fr peel-away sheath was placed over a guidewire. The catheter was then placed through the sheath and the sheath removed. Final catheter positioning was confirmed and documented with a fluoroscopic spot image. The port was accessed with a needle and aspirated and flushed with heparinized saline. The needle was removed.  The venotomy and port pocket incisions were closed with subcutaneous 3-0 Monocryl and subcuticular 4-0 Vicryl. Dermabond was applied to both incisions.  Complications: None. No pneumothorax.  FINDINGS: After catheter placement, the tip lies at the cavoatrial junction. The catheter aspirates normally and is ready for immediate use.  IMPRESSION: Placement of single lumen port a cath via right internal jugular vein. The catheter tip lies at the cavoatrial junction. A power injectable port a cath was placed and is ready for immediate use.   Electronically Signed   By: Aletta Edouard M.D.   On: 01/19/2014 17:23   Portable Chest 1 View  01/31/2014   CLINICAL DATA:  Cough and chest congestion.  EXAM: PORTABLE CHEST - 1 VIEW  COMPARISON:  04/25/2010.  FINDINGS: Normal sized heart with a marked interval decrease in size. Clear lungs. The lungs remain hyperexpanded. Right jugular port catheter tip in the superior vena cava. Mild scoliosis.  IMPRESSION: No acute abnormality.  Stable cardiomegaly.    Electronically Signed   By: Enrique Sack M.D.   On: 01/31/2014 18:57   Dg Abd 2 Views  02/04/2014   CLINICAL DATA:  Nausea and vomiting  EXAM: ABDOMEN - 2 VIEW  COMPARISON:  None.  FINDINGS: Gastrostomy tube is in place. No free intraperitoneal gas on the decubitus image. Diffuse colonic distension is noted. Phleboliths project over the pelvis.  IMPRESSION: Diffuse colonic distention.  No free intraperitoneal gas.   Electronically Signed   By: Maryclare Bean M.D.   On: 02/04/2014 11:12    Microbiology: Recent Results (from the past 240 hour(s))  URINE CULTURE     Status: None   Collection Time    02/01/14  2:00 AM      Result Value Ref Range Status   Specimen  Description URINE, RANDOM   Final   Special Requests NONE   Final   Culture  Setup Time     Final   Value: 02/01/2014 11:10     Performed at [facility] Count     Final   Value: 4,000 COLONIES/ML     Performed at [facility]   Culture     Final   Value: INSIGNIFICANT GROWTH     Performed at [facility]   Report Status 02/02/2014 FINAL   Final  SURGICAL PCR SCREEN     Status: Abnormal   Collection Time    02/02/14  8:50 AM      Result Value Ref Range Status   MRSA, PCR INVALID RESULTS, SPECIMEN SENT FOR CULTURE (*) NEGATIVE Final   Comment: NOTIFIED C. OBERRY RN AT 1105 ON 08.05.15 BY SHUEA   Staphylococcus aureus INVALID RESULTS, SPECIMEN SENT FOR CULTURE (*) NEGATIVE Final   Comment: NOTIFIED C. OBERRY RN AT 6433 ON 08.05.15 BY SHUEA                The Xpert SA Assay (FDA     approved for NASAL specimens     in patients over 14 years of age),     is one component of     a comprehensive surveillance     program.  Test performance has     been validated by [employer] for patients greater     than or equal to 54 year old.     It is not intended     to diagnose infection nor to     guide or monitor treatment.  MRSA CULTURE     Status: None   Collection Time    02/02/14  8:50 AM       Result Value Ref Range Status   Specimen Description NOSE   Final   Special Requests NONE   Final   Culture     Final   Value: NO STAPHYLOCOCCUS AUREUS ISOLATED     Note: NOMRSA     Performed at [facility]   Report Status 02/04/2014 FINAL   Final     Labs: Basic Metabolic Panel:  Recent Labs Lab 01/31/14 1825  02/02/14 0400 02/02/14 2050 02/03/14 0405 02/04/14 0607 02/05/14 0505 02/07/14 0440  NA 137  < > 143  --  142 138 136* 137  K 4.4  < > 4.1  --  4.9 4.2 3.8 3.0*  CL 102  < > 111  --  110 108 102 105  CO2 23  < > 19  --  18* 22 21 18*  GLUCOSE 178*  < > 72  --  84 152* 170* 142*  BUN 51*  < > 27*  --  24* 20 11 10   CREATININE 1.65*  < > 1.18 1.18 1.19 0.98 0.92 1.03  CALCIUM 8.9  < > 8.4  --  8.7 8.9 9.0 8.2*  MG 2.4  --   --   --   --   --   --  1.5  PHOS 3.8  --   --   --   --   --   --   --   < > = values in this interval not displayed. Liver Function Tests:  Recent Labs Lab 01/31/14 1825 02/07/14 0440  AST 22 28  ALT 10 13  ALKPHOS 58 52  BILITOT 0.3 0.2*  PROT 7.9  6.5  ALBUMIN 3.5 2.5*   No results found for this basename: LIPASE, AMYLASE,  in the last 168 hours No results found for this basename: AMMONIA,  in the last 168 hours CBC:  Recent Labs Lab 01/31/14 1825  02/02/14 2050 02/03/14 0405 02/04/14 0607 02/05/14 0505 02/07/14 0440  WBC 1.5*  < > 1.6* 1.7* 1.9* 1.8* 3.6*  NEUTROABS 0.9*  --   --   --   --   --   --   HGB 9.2*  < > 8.8* 7.8* 7.9* 10.7* 9.2*  HCT 26.8*  < > 26.5* 23.1* 24.0* 31.8* 26.9*  MCV 85.6  < > 87.7 86.8 87.0 86.6 85.4  PLT 105*  < > 100* 100* 113* 114* 117*  < > = values in this interval not displayed. Cardiac Enzymes: No results found for this basename: CKTOTAL, CKMB, CKMBINDEX, TROPONINI,  in the last 168 hours BNP: BNP (last 3 results) No results found for this basename: PROBNP,  in the last 8760 hours CBG:  Recent Labs Lab 02/06/14 1623 02/06/14 2041 02/07/14 0025 02/07/14 0443 02/07/14 0759   GLUCAP 126* 114* 133* 129* 148*       Signed:  Miranda Garber A  [facility] 02/07/2014, 11:42 AM

## 2014-02-07 NOTE — Progress Notes (Signed)
Patient is set to discharge to [facility] today. Patient & friend, Shanon Brow aware. Discharge packet in [place] aware. [facility] called for transport.   Clinical Social Work Department CLINICAL SOCIAL WORK PLACEMENT NOTE 02/07/2014  Patient:  Michael Keith, Michael Keith  Account Number:  [id number] Admit date:  01/31/2014  Clinical Social Worker:  Renold Genta  Date/time:  02/07/2014 12:34 PM  Clinical Social Work is seeking post-discharge placement for this patient at the following level of care:   SKILLED NURSING   (*CSW will update this form in Epic as items are completed)   02/07/2014  Patient/family provided with [facility] Department of Clinical Social Work's list of facilities offering this level of care within the geographic area requested by the patient (or if unable, by the patient's family).  02/07/2014  Patient/family informed of their freedom to choose among providers that offer the needed level of care, that participate in Medicare, Medicaid or managed care program needed by the patient, have an available bed and are willing to accept the patient.  02/07/2014  Patient/family informed of [facility]' ownership interest in [facility], as well as of the fact that they are under no obligation to receive care at this facility.  PASARR submitted to EDS on 02/07/2014 PASARR number received on 02/07/2014  FL2 transmitted to all facilities in geographic area requested by pt/family on  02/07/2014 FL2 transmitted to all facilities within larger geographic area on   Patient informed that his/her managed care company has contracts with or will negotiate with  certain facilities, including the following:     Patient/family informed of bed offers received:  02/07/2014 Patient chooses bed at [facility] Physician recommends and patient chooses bed at    Patient to be transferred to [facility] on  02/07/2014 Patient to be  transferred to facility by [facility] Patient and family notified of transfer on 02/07/2014 Name of family member notified:  patient's friend, Shanon Brow via phone  The following physician request were entered in Epic:   Additional Comments:   Raynaldo Opitz, [facility] Social Worker cell #: [phone]

## 2014-02-07 NOTE — Progress Notes (Signed)
Assumed care of patient from prior RN at 23:00.  I agree with previous assessment and will continue to monitor patient. Terissa Haffey Cherie  

## 2014-02-07 NOTE — Progress Notes (Signed)
Clinical Social Work Department BRIEF PSYCHOSOCIAL ASSESSMENT 02/07/2014  Patient:  Michael Keith, Michael Keith     Account Number:  [id number]     Admit date:  01/31/2014  Clinical Social Worker:  Renold Genta  Date/Time:  02/07/2014 12:28 PM  Referred by:  Physician  Date Referred:  02/07/2014 Referred for  SNF Placement   Other Referral:   Interview type:  Patient Other interview type:    PSYCHOSOCIAL DATA Living Status:  ALONE Admitted from facility:   Level of care:   Primary support name:  [name] (relative) ph#: [phone] Primary support relationship to patient:  FAMILY Degree of support available:   fair    CURRENT CONCERNS Current Concerns  Post-Acute Placement   Other Concerns:    SOCIAL WORK ASSESSMENT / PLAN CSW recevied consult from [facility] that patient will need SNF at discharge as he lives alone and has a new PEG.   Assessment/plan status:  Information/Referral to [employer] Other assessment/ plan:   Information/referral to community resources:   CSW completed FL2 and faxed information to [facility] - provided bed offers.    PATIENT'S/FAMILY'S RESPONSE TO PLAN OF CARE: Patient states that [facility] SNF would be closest to his house and would prefer to go there. Dorian Pod @ [place] aware.       Raynaldo Opitz, [facility] Clinical Social Worker cell #: [phone]

## 2014-02-07 NOTE — Progress Notes (Signed)
PT Cancellation Note  Patient Details Name: Michael Keith MRN: [id number] DOB: 1951/01/15   Cancelled Treatment:    Reason Eval/Treat Not Completed: Patient at procedure or test/unavailable   [facility] 02/07/2014, 2:49 PM

## 2014-02-07 NOTE — Progress Notes (Signed)
Physician Discharge Summary  Michael Keith PTE:[id number] DOB: [birth year] DOA: 01/31/2014  PCP: Philis Fendt, MD  Admit date: 01/31/2014 Discharge date: 02/07/2014  Time spent: 40 minutes  Recommendations for Outpatient Follow-up:  1. Followup with Dr. she died in 40 week. 2. To be followed by dietitian in the nursing home for further advancement of his tube feeding  Discharge Diagnoses:  Principal Problem:   Dehydration Active Problems:   Nonischemic cardiomyopathy   Tobacco abuse   COPD (chronic obstructive pulmonary disease)   CAD (coronary artery disease)   Chronic systolic CHF (congestive heart failure)   Dysphasia   Squamous cell esophageal cancer   Cancer of upper third of esophagus   Orthostasis   FTT (failure to thrive) in adult   Severe protein-calorie malnutrition   Discharge Condition: Stable  Diet recommendation: Soft diet  Filed Weights   02/05/14 0350 02/06/14 0454 02/07/14 0432  Weight: 48.263 kg (106 lb 6.4 oz) 51.211 kg (112 lb 14.4 oz) 50.304 kg (110 lb 14.4 oz)    History of present illness:  Michael Keith is a 63 y.o. male  African American gentleman with history of chronic pancreatitis, nonischemic cardiomyopathy, chronic systolic CHF, history of tobacco abuse, COPD, coronary artery disease, squamous cell esophageal cancer undergoing chemotherapy and radiation who was receiving radiation therapy when it was noted that her pulse could not be obtained it was noted to be dehydrated with systolic blood pressures in the 90s. Will call to directly admit the patient for further evaluation and management.  Patient states has lost about 3 pounds over the past weekend. Patient endorses poor oral intake and only able to tolerate liquids. Patient endorses some chills and generalized weakness. Patient also endorses a cough of clear sputum. Patient denies any fevers, no nausea, no vomiting, no chest pain, no shortness of breath, no lightheadedness, no dizziness, no  syncope, no presyncope, no abdominal pain, no dysuria, no melena, no hematemesis, no hematochezia, no hemoptysis. Patient does state that ever since starting radiation is at increased urine output. Patient also endorses poor oral intake.  Patient states he was evaluated by general surgery for feeding tube placement which is currently pending. Patient stated that he was seen by cardiology, Dr. Ron Parker for preop clearance and was told he was clear to undergo feeding tube placement.  Hospital Course:   Dehydration/orthostasis: due to poor PO intake  -Patient presented to the hospital with acute on chronic renal failure with creatinine of 1.65. -His Lasix discontinued, patient started on aggressive hydration with IV fluids. -Patient has poor oral intake secondary to odynophagia/dysphagia. -G-tube placed 8/6 laparoscopically by Dr. Hassell Done.  -Patient developed some nausea/vomiting after tube feedings started initially.  -Feels better, tube feeding at 20 mL/hour, tolerated that very well, advanced slowly. -Patient will be discharged to nursing home, to feeding should be advanced carefully to reach his dietary needs.  Squamous cell esophageal cancer: actively receiving chemotherapy and radiation therapy  -oncology on board, will follow rec's  -Seen by oncology, decided to hold on chemotherapy until he gets better and his nutritional status improves.  Dysphagia: secondary to cancer and most likely esophagitis from radiation  -Discharged on tube feeding, started also on omeprazole (Zegerid)  Pancytopenia: .  -Presented initially with pancytopenia, and his blood counts improves.  Chronic systolyc heart failure and CAD: compensated. No CP or SOB.  -holding diuretics and ASA  -continue daily weights  -Strict I's and O's   COPD: compensated: continue PRN albuterol   Tobacco abuse: cessation  counseling provided  -started on nicotine patch   Severe protein calorie malnutrition: continue feeding  supplements  -Started on tube feeding, patient initially had vomiting but started to tolerate it. -Advance tube feeding very slowly.  Urinary retention  -Likely patient has prostatic hypertrophy, start Hytrin. -Patient also getting narcotics which might cause urinary retention      Procedures:  Placement of gastric tube done laparoscopically by Dr. Hassell Done on 02/02/2014.  Consultations:  General surgery.  Oncology.  Discharge Exam: Filed Vitals:   02/07/14 0528  BP:   Pulse: 65  Temp:   Resp: 19   General: Alert and awake, oriented x3, not in any acute distress. HEENT: anicteric sclera, pupils reactive to light and accommodation, EOMI CVS: S1-S2 clear, no murmur rubs or gallops Chest: clear to auscultation bilaterally, no wheezing, rales or rhonchi Abdomen: soft nontender, nondistended, normal bowel sounds, no organomegaly Extremities: no cyanosis, clubbing or edema noted bilaterally Neuro: Cranial nerves II-XII intact, no focal neurological deficits  Discharge Instructions You were cared for by a hospitalist during your hospital stay. If you have any questions about your discharge medications or the care you received while you were in the hospital after you are discharged, you can call the unit and asked to speak with the hospitalist on call if the hospitalist that took care of you is not available. Once you are discharged, your primary care physician will handle any further medical issues. Please note that NO REFILLS for any discharge medications will be authorized once you are discharged, as it is imperative that you return to your primary care physician (or establish a relationship with a primary care physician if you do not have one) for your aftercare needs so that they can reassess your need for medications and monitor your lab values.  Discharge Instructions   Increase activity slowly    Complete by:  As directed             Medication List    STOP taking these  medications       furosemide 40 MG tablet  Commonly known as:  LASIX      TAKE these medications       aspirin 81 MG tablet  Place 1 tablet (81 mg total) into feeding tube daily.     emollient cream  Commonly known as:  BIAFINE  Apply topically as needed (radiation side effects.).     feeding supplement (JEVITY 1.2 CAL) Liqd  Place 20 mL/hr into feeding tube continuous.     HYDROcodone-acetaminophen 7.5-325 mg/15 ml solution  Commonly known as:  HYCET  Place 15 mLs into feeding tube every 6 (six) hours as needed.     lidocaine-prilocaine cream  Commonly known as:  EMLA  Apply 1 application topically as needed (port access.).     metoCLOPramide 5 MG tablet  Commonly known as:  REGLAN  Take 1 tablet (5 mg total) by mouth every 8 (eight) hours as needed for nausea.     multivitamin with minerals Tabs tablet  Place 1 tablet into feeding tube daily.     nitroGLYCERIN 0.4 MG SL tablet  Commonly known as:  NITROSTAT  Place 0.4 mg under the tongue every 5 (five) minutes as needed for chest pain.     Omeprazole-Sodium Bicarbonate 40-1680 MG Pack  Place 1 Package into feeding tube daily.     ondansetron 4 MG disintegrating tablet  Commonly known as:  ZOFRAN ODT  Take 1 tablet (4 mg total) by mouth every 8 (  eight) hours as needed for nausea or vomiting.     thiamine 100 MG tablet  Commonly known as:  VITAMIN B-1  Place 1 tablet (100 mg total) into feeding tube daily.       No Known Allergies    The results of significant diagnostics from this hospitalization (including imaging, microbiology, ancillary and laboratory) are listed below for reference.    Significant Diagnostic Studies: Ir Fluoro Guide Cv Line Right  01/19/2014   CLINICAL DATA:  Esophageal carcinoma and need for porta cath to begin chemotherapy.  EXAM: IMPLANTED PORT A CATH PLACEMENT WITH ULTRASOUND AND FLUOROSCOPIC GUIDANCE  ANESTHESIA/SEDATION: 1.0 Mg IV Versed; 50 mcg IV Fentanyl  Total Moderate Sedation  Time:  45 minutes  Additional Medications: 2 g IV Ancef. As antibiotic prophylaxis, Ancef was ordered pre-procedure and administered intravenously within one hour of incision.  FLUOROSCOPY TIME:  18 seconds.  PROCEDURE: The procedure, risks, benefits, and alternatives were explained to the patient. Questions regarding the procedure were encouraged and answered. The patient understands and consents to the procedure.  The right neck and chest were prepped with chlorhexidine in a sterile fashion, and a sterile drape was applied covering the operative field. Maximum barrier sterile technique with sterile gowns and gloves were used for the procedure. Local anesthesia was provided with 1% lidocaine. Ultrasound was used to confirm patency of the right internal jugular vein.  After creating a small venotomy incision, a 21 gauge needle was advanced into the right internal jugular vein under direct, real-time ultrasound guidance. Ultrasound image documentation was performed. After securing guidewire access, an 8 Fr dilator was placed. A J-wire was kinked to measure appropriate catheter length.  A subcutaneous port pocket was then created along the upper chest wall utilizing sharp and blunt dissection. Portable cautery was utilized. The pocket was irrigated with sterile saline.  A single lumen power injectable port was chosen for placement. The 8 Fr catheter was tunneled from the port pocket site to the venotomy incision. The port was placed in the pocket. External catheter was trimmed to appropriate length based on guidewire measurement.  At the venotomy, an 8 Fr peel-away sheath was placed over a guidewire. The catheter was then placed through the sheath and the sheath removed. Final catheter positioning was confirmed and documented with a fluoroscopic spot image. The port was accessed with a needle and aspirated and flushed with heparinized saline. The needle was removed.  The venotomy and port pocket incisions were closed  with subcutaneous 3-0 Monocryl and subcuticular 4-0 Vicryl. Dermabond was applied to both incisions.  Complications: None. No pneumothorax.  FINDINGS: After catheter placement, the tip lies at the cavoatrial junction. The catheter aspirates normally and is ready for immediate use.  IMPRESSION: Placement of single lumen port a cath via right internal jugular vein. The catheter tip lies at the cavoatrial junction. A power injectable port a cath was placed and is ready for immediate use.   Electronically Signed   By: Aletta Edouard M.D.   On: 01/19/2014 17:23   Ir US Guide Vasc Access Right  01/19/2014   CLINICAL DATA:  Esophageal carcinoma and need for porta cath to begin chemotherapy.  EXAM: IMPLANTED PORT A CATH PLACEMENT WITH ULTRASOUND AND FLUOROSCOPIC GUIDANCE  ANESTHESIA/SEDATION: 1.0 Mg IV Versed; 50 mcg IV Fentanyl  Total Moderate Sedation Time:  45 minutes  Additional Medications: 2 g IV Ancef. As antibiotic prophylaxis, Ancef was ordered pre-procedure and administered intravenously within one hour of incision.  FLUOROSCOPY TIME:  18 seconds.  PROCEDURE: The procedure, risks, benefits, and alternatives were explained to the patient. Questions regarding the procedure were encouraged and answered. The patient understands and consents to the procedure.  The right neck and chest were prepped with chlorhexidine in a sterile fashion, and a sterile drape was applied covering the operative field. Maximum barrier sterile technique with sterile gowns and gloves were used for the procedure. Local anesthesia was provided with 1% lidocaine. Ultrasound was used to confirm patency of the right internal jugular vein.  After creating a small venotomy incision, a 21 gauge needle was advanced into the right internal jugular vein under direct, real-time ultrasound guidance. Ultrasound image documentation was performed. After securing guidewire access, an 8 Fr dilator was placed. A J-wire was kinked to measure appropriate  catheter length.  A subcutaneous port pocket was then created along the upper chest wall utilizing sharp and blunt dissection. Portable cautery was utilized. The pocket was irrigated with sterile saline.  A single lumen power injectable port was chosen for placement. The 8 Fr catheter was tunneled from the port pocket site to the venotomy incision. The port was placed in the pocket. External catheter was trimmed to appropriate length based on guidewire measurement.  At the venotomy, an 8 Fr peel-away sheath was placed over a guidewire. The catheter was then placed through the sheath and the sheath removed. Final catheter positioning was confirmed and documented with a fluoroscopic spot image. The port was accessed with a needle and aspirated and flushed with heparinized saline. The needle was removed.  The venotomy and port pocket incisions were closed with subcutaneous 3-0 Monocryl and subcuticular 4-0 Vicryl. Dermabond was applied to both incisions.  Complications: None. No pneumothorax.  FINDINGS: After catheter placement, the tip lies at the cavoatrial junction. The catheter aspirates normally and is ready for immediate use.  IMPRESSION: Placement of single lumen port a cath via right internal jugular vein. The catheter tip lies at the cavoatrial junction. A power injectable port a cath was placed and is ready for immediate use.   Electronically Signed   By: Aletta Edouard M.D.   On: 01/19/2014 17:23   Portable Chest 1 View  01/31/2014   CLINICAL DATA:  Cough and chest congestion.  EXAM: PORTABLE CHEST - 1 VIEW  COMPARISON:  04/25/2010.  FINDINGS: Normal sized heart with a marked interval decrease in size. Clear lungs. The lungs remain hyperexpanded. Right jugular port catheter tip in the superior vena cava. Mild scoliosis.  IMPRESSION: No acute abnormality.  Stable cardiomegaly.   Electronically Signed   By: Enrique Sack M.D.   On: 01/31/2014 18:57   Dg Abd 2 Views  02/04/2014   CLINICAL DATA:  Nausea and  vomiting  EXAM: ABDOMEN - 2 VIEW  COMPARISON:  None.  FINDINGS: Gastrostomy tube is in place. No free intraperitoneal gas on the decubitus image. Diffuse colonic distension is noted. Phleboliths project over the pelvis.  IMPRESSION: Diffuse colonic distention.  No free intraperitoneal gas.   Electronically Signed   By: Maryclare Bean M.D.   On: 02/04/2014 11:12    Microbiology: Recent Results (from the past 240 hour(s))  URINE CULTURE     Status: None   Collection Time    02/01/14  2:00 AM      Result Value Ref Range Status   Specimen Description URINE, RANDOM   Final   Special Requests NONE   Final   Culture  Setup Time     Final   Value:  02/01/2014 11:10     Performed at [place]     Final   Value: 4,000 COLONIES/ML     Performed at [facility]   Culture     Final   Value: INSIGNIFICANT GROWTH     Performed at [facility]   Report Status 02/02/2014 FINAL   Final  SURGICAL PCR SCREEN     Status: Abnormal   Collection Time    02/02/14  8:50 AM      Result Value Ref Range Status   MRSA, PCR INVALID RESULTS, SPECIMEN SENT FOR CULTURE (*) NEGATIVE Final   Comment: NOTIFIED C. OBERRY RN AT 1105 ON 08.05.15 BY SHUEA   Staphylococcus aureus INVALID RESULTS, SPECIMEN SENT FOR CULTURE (*) NEGATIVE Final   Comment: NOTIFIED C. OBERRY RN AT 2297 ON 08.05.15 BY SHUEA                The Xpert SA Assay (FDA     approved for NASAL specimens     in patients over 56 years of age),     is one component of     a comprehensive surveillance     program.  Test performance has     been validated by [employer] for patients greater     than or equal to 37 year old.     It is not intended     to diagnose infection nor to     guide or monitor treatment.  MRSA CULTURE     Status: None   Collection Time    02/02/14  8:50 AM      Result Value Ref Range Status   Specimen Description NOSE   Final   Special Requests NONE   Final   Culture     Final    Value: NO STAPHYLOCOCCUS AUREUS ISOLATED     Note: NOMRSA     Performed at [facility]   Report Status 02/04/2014 FINAL   Final     Labs: Basic Metabolic Panel:  Recent Labs Lab 01/31/14 1825  02/02/14 0400 02/02/14 2050 02/03/14 0405 02/04/14 0607 02/05/14 0505 02/07/14 0440  NA 137  < > 143  --  142 138 136* 137  K 4.4  < > 4.1  --  4.9 4.2 3.8 3.0*  CL 102  < > 111  --  110 108 102 105  CO2 23  < > 19  --  18* 22 21 18*  GLUCOSE 178*  < > 72  --  84 152* 170* 142*  BUN 51*  < > 27*  --  24* 20 11 10   CREATININE 1.65*  < > 1.18 1.18 1.19 0.98 0.92 1.03  CALCIUM 8.9  < > 8.4  --  8.7 8.9 9.0 8.2*  MG 2.4  --   --   --   --   --   --  1.5  PHOS 3.8  --   --   --   --   --   --   --   < > = values in this interval not displayed. Liver Function Tests:  Recent Labs Lab 01/31/14 1825 02/07/14 0440  AST 22 28  ALT 10 13  ALKPHOS 58 52  BILITOT 0.3 0.2*  PROT 7.9 6.5  ALBUMIN 3.5 2.5*   No results found for this basename: LIPASE, AMYLASE,  in the last 168 hours No results found for this basename: AMMONIA,  in the last 168 hours CBC:  Recent Labs Lab 01/31/14 1825  02/02/14 2050 02/03/14 0405 02/04/14 0607 02/05/14 0505 02/07/14 0440  WBC 1.5*  < > 1.6* 1.7* 1.9* 1.8* 3.6*  NEUTROABS 0.9*  --   --   --   --   --   --   HGB 9.2*  < > 8.8* 7.8* 7.9* 10.7* 9.2*  HCT 26.8*  < > 26.5* 23.1* 24.0* 31.8* 26.9*  MCV 85.6  < > 87.7 86.8 87.0 86.6 85.4  PLT 105*  < > 100* 100* 113* 114* 117*  < > = values in this interval not displayed. Cardiac Enzymes: No results found for this basename: CKTOTAL, CKMB, CKMBINDEX, TROPONINI,  in the last 168 hours BNP: BNP (last 3 results) No results found for this basename: PROBNP,  in the last 8760 hours CBG:  Recent Labs Lab 02/06/14 1623 02/06/14 2041 02/07/14 0025 02/07/14 0443 02/07/14 0759  GLUCAP 126* 114* 133* 129* 148*       Signed:  Sunya Humbarger A  [facility] 02/07/2014, 11:25 AM

## 2014-02-07 NOTE — Progress Notes (Signed)
   Weekly Management Note:  Inpatient Current Dose:  26 Gy  Projected Dose: 60 Gy    ICD-9-CM   1. Squamous cell esophageal cancer 150.9 topical emolient (BIAFINE) emulsion    DISCONTINUED: topical emolient (BIAFINE) emulsion  2. Cancer of upper third of esophagus 150.3    Narrative:  The patient presents for routine under treatment assessment.  CBCT/MVCT images/Port film x-rays were reviewed.  The chart was checked. Getting d/c'd to SNF today.  He has minimal complaints. Denies pain currently. Now getting PEG feedings, Can swallow a little water. Not applying cream to neck (ie Biafine) to decrease RT irritation. Renal function better with IV fluids.  Physical Findings: NAD, in wheelchair, skin intact over chest/ neck Vitals with Age-Percentiles 7/42/5956  Length   Systolic 387  Diastolic 89  Pulse 79  Respiration 18  Weight 50.304 kg  BMI   VISIT REPORT     CBC    Component Value Date/Time   WBC 3.6* 02/07/2014 0440   WBC 2.6* 01/25/2014 1220   RBC 3.15* 02/07/2014 0440   RBC 3.52* 01/25/2014 1220   RBC 3.79* 10/03/2009 0445   HGB 9.2* 02/07/2014 0440   HGB 10.1* 01/25/2014 1220   HCT 26.9* 02/07/2014 0440   HCT 31.2* 01/25/2014 1220   PLT 117* 02/07/2014 0440   PLT 126* 01/25/2014 1220   MCV 85.4 02/07/2014 0440   MCV 88.5 01/25/2014 1220   MCH 29.2 02/07/2014 0440   MCH 28.6 01/25/2014 1220   MCHC 34.2 02/07/2014 0440   MCHC 32.3 01/25/2014 1220   RDW 13.8 02/07/2014 0440   RDW 12.8 01/25/2014 1220   LYMPHSABS 0.3* 01/31/2014 1825   LYMPHSABS 0.6* 01/25/2014 1220   MONOABS 0.3 01/31/2014 1825   MONOABS 0.3 01/25/2014 1220   EOSABS 0.0 01/31/2014 1825   EOSABS 0.1 01/25/2014 1220   BASOSABS 0.0 01/31/2014 1825   BASOSABS 0.0 01/25/2014 1220     CMP     Component Value Date/Time   NA 137 02/07/2014 0440   NA 140 01/25/2014 1220   K 3.0* 02/07/2014 0440   K 4.3 01/25/2014 1220   CL 105 02/07/2014 0440   CO2 18* 02/07/2014 0440   CO2 23 01/25/2014 1220   GLUCOSE 142* 02/07/2014 0440   GLUCOSE 133 01/25/2014 1220   BUN 10 02/07/2014 0440   BUN 40.9* 01/25/2014 1220   CREATININE 1.03 02/07/2014 0440   CREATININE 1.8* 01/25/2014 1220   CALCIUM 8.2* 02/07/2014 0440   CALCIUM 9.6 01/25/2014 1220   PROT 6.5 02/07/2014 0440   PROT 8.1 01/25/2014 1220   ALBUMIN 2.5* 02/07/2014 0440   ALBUMIN 3.8 01/25/2014 1220   AST 28 02/07/2014 0440   AST 22 01/25/2014 1220   ALT 13 02/07/2014 0440   ALT 10 01/25/2014 1220   ALKPHOS 52 02/07/2014 0440   ALKPHOS 54 01/25/2014 1220   BILITOT 0.2* 02/07/2014 0440   BILITOT 0.50 01/25/2014 1220   GFRNONAA 75* 02/07/2014 0440   GFRAA 87* 02/07/2014 0440     Impression:  The patient is tolerating radiotherapy.   Plan:  Continue radiotherapy as planned. Gave him a tube of Biafine with instructions to apply BID to neck and upper torso to lessen irritation from RT. D/c to SNF today.  -----------------------------------  Eppie Gibson, MD

## 2014-02-08 ENCOUNTER — Telehealth: Payer: Self-pay | Admitting: *Deleted

## 2014-02-08 ENCOUNTER — Ambulatory Visit: Payer: Medicare Other

## 2014-02-08 NOTE — Telephone Encounter (Signed)
Received call from patient's cousin, Shanon Brow.  He indicated he rec'd call from [facility] asking for his assistance with transporting Mr. Graziani today.  He explained to her that he is unable b/c he is out-of-town attending a funeral.  I called Tammy at [facility]  She stated that she had communication with [facility] and was told that Mr. Grieshop is not enrolled.   I stated he has been taking [facility] for several weeks.  She stated she would get back to me when she has follow-up with [facility].  [name], RN, BSN, [place] at [place] [phone]

## 2014-02-08 NOTE — Telephone Encounter (Signed)
Spoke with Tammy, scheduler with [facility], to confirm transportation to [facility] for Mr. Coulibaly's RT appts.  She stated that she had not yet processed his paperwork since his arrival yesterday after and was unaware of his appts at [facility].  She stated that the patient is responsible for arranging transportation from [facility] to appts; they can provide at a cost.  She noted he has [facility], I responded that he is unable to use [facility] at this time based on my evaluation of his condition when I saw him in [place] yesterday.  I also noted he was on oxygen yesterday.  I provided Tammy Shanon Brow Jackson's phone #s (patient cousin); she said she would call him to see if he could assist with transportation today.  I noted he currently has a 2:10 appt for RT daily through 9/4 and that other appts may be scheduled.  I asked that she call me to confirm transportation arrangements for today.  She agreed.  [name], RN, BSN, [place] at [place] [phone]

## 2014-02-08 NOTE — Telephone Encounter (Addendum)
Rec'd VM from Tammy at [facility] stating she has made arrangements with [facility] for his transportation to [facility] for his appts for remainder of week.  I called Shanon Brow with this information.  [name], RN, BSN, [place] at [place] [phone]

## 2014-02-09 ENCOUNTER — Ambulatory Visit
Admission: RE | Admit: 2014-02-09 | Discharge: 2014-02-09 | Disposition: A | Discharge status: Home / Self Care | Payer: Medicare Other | Source: Ambulatory Visit | Attending: Radiation Oncology | Admitting: Radiation Oncology

## 2014-02-09 ENCOUNTER — Encounter: Payer: Self-pay | Admitting: *Deleted

## 2014-02-09 DIAGNOSIS — Z510 Encounter for antineoplastic radiation therapy: Secondary | ICD-10-CM | POA: Diagnosis not present

## 2014-02-09 DIAGNOSIS — C153 Malignant neoplasm of upper third of esophagus: Secondary | ICD-10-CM | POA: Diagnosis not present

## 2014-02-09 DIAGNOSIS — Z931 Gastrostomy status: Secondary | ICD-10-CM | POA: Diagnosis not present

## 2014-02-09 DIAGNOSIS — Z681 Body mass index (BMI) 19 or less, adult: Secondary | ICD-10-CM | POA: Diagnosis not present

## 2014-02-09 DIAGNOSIS — R1310 Dysphagia, unspecified: Secondary | ICD-10-CM | POA: Diagnosis not present

## 2014-02-09 DIAGNOSIS — R000 Tachycardia, unspecified: Secondary | ICD-10-CM | POA: Diagnosis not present

## 2014-02-09 DIAGNOSIS — E860 Dehydration: Secondary | ICD-10-CM | POA: Diagnosis not present

## 2014-02-09 DIAGNOSIS — R0989 Other specified symptoms and signs involving the circulatory and respiratory systems: Secondary | ICD-10-CM | POA: Diagnosis not present

## 2014-02-09 DIAGNOSIS — R627 Adult failure to thrive: Secondary | ICD-10-CM | POA: Diagnosis not present

## 2014-02-10 ENCOUNTER — Ambulatory Visit
Admission: RE | Admit: 2014-02-10 | Discharge: 2014-02-10 | Disposition: A | Discharge status: Home / Self Care | Payer: Medicare Other | Source: Ambulatory Visit | Attending: Radiation Oncology | Admitting: Radiation Oncology

## 2014-02-10 ENCOUNTER — Telehealth: Payer: Self-pay | Admitting: *Deleted

## 2014-02-10 DIAGNOSIS — Z510 Encounter for antineoplastic radiation therapy: Secondary | ICD-10-CM | POA: Diagnosis not present

## 2014-02-10 NOTE — Progress Notes (Signed)
Met with Mr. Sermon during scheduled RT.  He expressed concerns about [facility] scheduling of his appts while at [facility].  I explained to him that 1)  I have been in communication with Tammy at [facility] and that she is aware of his current appts at [facility], 2) Tammy will arrange [facility] appts for him while he resides at [facility].  I provided him an Epic calendar of appts for upcoming weeks, explained that I will provide him an update as necessary and notify Tammy of changes.  He verbalized understanding.  [name], RN, BSN, [place] at [place] [phone]

## 2014-02-10 NOTE — Telephone Encounter (Signed)
Spoke with Tammy at [facility].  Informed her that I called [facility] yesterday for scheduling of patient's return to [facility] from [facility] for today and tomorrow.  She confirmed shed will be responsible for arranging [facility] appts for the duration of his [facility] admission.  I stated that [facility] will be responsible for contacting [facility] if there is an unexpected tmt delay that precludes his availability for scheduled pick-up and we will request an 'on-call' pick-up.  She confirmed that she will manage [facility] appts for the duration of his [facility] admission.  [name], RN, BSN, [place] at [place] [phone]

## 2014-02-11 ENCOUNTER — Ambulatory Visit
Admission: RE | Admit: 2014-02-11 | Discharge: 2014-02-11 | Disposition: A | Discharge status: Home / Self Care | Payer: Medicare Other | Source: Ambulatory Visit | Attending: Radiation Oncology | Admitting: Radiation Oncology

## 2014-02-11 DIAGNOSIS — Z510 Encounter for antineoplastic radiation therapy: Secondary | ICD-10-CM | POA: Diagnosis not present

## 2014-02-14 ENCOUNTER — Ambulatory Visit: Admission: RE | Admit: 2014-02-14 | Payer: Medicare Other | Source: Ambulatory Visit | Admitting: Radiation Oncology

## 2014-02-14 ENCOUNTER — Ambulatory Visit: Payer: Medicare Other

## 2014-02-14 ENCOUNTER — Encounter: Payer: Self-pay | Admitting: *Deleted

## 2014-02-14 NOTE — Progress Notes (Signed)
Patient did not arrive for 2:10 RT.  1)  I called SCAT to see if he had been picked up from [facility], receptionist stated there were no transportation appointments scheduled for him.  I then spoke with Loma Sousa and at my request, she placed him 'On Call' for pick-up, to arrive him at the best of their ability to [facility] by 4:15.  [facility] therapists had indicated earlier they could treat him if he arrived by that time.  I set up his appointments for the remainder of the week. 2)  I called [facility], spoke with Brantley Persons, RN, Nursing Supervisor.  I explained the situation, indicated that in my conversation with Tammy last Thursday, she stated she would arrange Mr. Slifer's SCAT appts for this week and clearly that had not been done.   He said he would inform Mr. Brallier that a later appt was being set up for him.  I followed-up our conversation by emailing him Mr. Ciancio's SCAT schedule.  I spoke with him by phone afterwards, he confirmed email receipt. 3)  I received a call from [place] with SCAT at ca. 4:30 indicating he was enroute.  I explained that this would be too late for him to receive treatment and that he should be returned to [facility]. 4)  I called Mr. Zabriskie's cousin, Shanon Brow, to inform him of today's events.  [name], RN, BSN, [place] at [place] [phone]

## 2014-02-15 ENCOUNTER — Ambulatory Visit
Admission: RE | Admit: 2014-02-15 | Discharge: 2014-02-15 | Disposition: A | Discharge status: Home / Self Care | Payer: Medicare Other | Source: Ambulatory Visit | Attending: Radiation Oncology | Admitting: Radiation Oncology

## 2014-02-15 ENCOUNTER — Encounter: Payer: Self-pay | Admitting: Radiation Oncology

## 2014-02-15 ENCOUNTER — Encounter: Payer: Self-pay | Admitting: *Deleted

## 2014-02-15 DIAGNOSIS — Z510 Encounter for antineoplastic radiation therapy: Secondary | ICD-10-CM | POA: Diagnosis not present

## 2014-02-15 NOTE — Progress Notes (Signed)
Met Michael Keith in lobby as SCAT arrived at 2:20 (bringing him by wheelchair, on O2 [place]), facilitated his registration, escorted him to [facility] where he received tmt and met with Dr. Valere Dross.  I escorted him back to lobby, he asked that he be taken outside ("it's cold inside) to wait for SCAT.  I complied with his request, encourage him to ask for assistance if he felt he needed it, he agreed.  [name], RN, BSN, [place] at [place] [phone]

## 2014-02-15 NOTE — Progress Notes (Signed)
Weekly Management Note:  Site: Upper esophagus Current Dose:  3400  cGy Projected Dose: 6000  cGy  Narrative: The patient is seen today for routine under treatment assessment. CBCT/MVCT images/port films were reviewed. The chart was reviewed.   He is without new complaints today. His PEG tube feedings are going well. He can E. can swallow small bites of food. No pain on swallowing. He uses Biafine cream along his chest/neck. He currently resides in a nursing home and is having some difficulty getting back and forth. His weight remains stable.  Physical Examination: There were no vitals filed for this visit..  Weight: 109.4 pounds Oral cavity unremarkable to inspection. There no areas of skin desquamation.  Laboratory data:  Lab Results  Component Value Date   WBC 3.6* 02/07/2014   HGB 9.2* 02/07/2014   HCT 26.9* 02/07/2014   MCV 85.4 02/07/2014   PLT 117* 02/07/2014   CMP     Component Value Date/Time   NA 137 02/07/2014 0440   NA 140 01/25/2014 1220   K 3.0* 02/07/2014 0440   K 4.3 01/25/2014 1220   CL 105 02/07/2014 0440   CO2 18* 02/07/2014 0440   CO2 23 01/25/2014 1220   GLUCOSE 142* 02/07/2014 0440   GLUCOSE 133 01/25/2014 1220   BUN 10 02/07/2014 0440   BUN 40.9* 01/25/2014 1220   CREATININE 1.03 02/07/2014 0440   CREATININE 1.8* 01/25/2014 1220   CALCIUM 8.2* 02/07/2014 0440   CALCIUM 9.6 01/25/2014 1220   PROT 6.5 02/07/2014 0440   PROT 8.1 01/25/2014 1220   ALBUMIN 2.5* 02/07/2014 0440   ALBUMIN 3.8 01/25/2014 1220   AST 28 02/07/2014 0440   AST 22 01/25/2014 1220   ALT 13 02/07/2014 0440   ALT 10 01/25/2014 1220   ALKPHOS 52 02/07/2014 0440   ALKPHOS 54 01/25/2014 1220   BILITOT 0.2* 02/07/2014 0440   BILITOT 0.50 01/25/2014 1220   GFRNONAA 75* 02/07/2014 0440   GFRAA 87* 02/07/2014 0440     Impression: Tolerating radiation therapy well.  Plan: Continue radiation therapy as planned.

## 2014-02-16 ENCOUNTER — Ambulatory Visit
Admission: RE | Admit: 2014-02-16 | Discharge: 2014-02-16 | Disposition: A | Discharge status: Home / Self Care | Payer: Medicare Other | Source: Ambulatory Visit | Attending: Radiation Oncology | Admitting: Radiation Oncology

## 2014-02-16 ENCOUNTER — Encounter: Payer: Self-pay | Admitting: *Deleted

## 2014-02-16 DIAGNOSIS — Z510 Encounter for antineoplastic radiation therapy: Secondary | ICD-10-CM | POA: Diagnosis not present

## 2014-02-17 ENCOUNTER — Ambulatory Visit
Admission: RE | Admit: 2014-02-17 | Discharge: 2014-02-17 | Disposition: A | Discharge status: Home / Self Care | Payer: Medicare Other | Source: Ambulatory Visit | Attending: Radiation Oncology | Admitting: Radiation Oncology

## 2014-02-17 ENCOUNTER — Other Ambulatory Visit: Payer: Self-pay | Admitting: Oncology

## 2014-02-17 DIAGNOSIS — C153 Malignant neoplasm of upper third of esophagus: Secondary | ICD-10-CM

## 2014-02-17 DIAGNOSIS — Z510 Encounter for antineoplastic radiation therapy: Secondary | ICD-10-CM | POA: Diagnosis not present

## 2014-02-18 ENCOUNTER — Ambulatory Visit
Admission: RE | Admit: 2014-02-18 | Discharge: 2014-02-18 | Disposition: A | Discharge status: Home / Self Care | Payer: Medicare Other | Source: Ambulatory Visit | Attending: Radiation Oncology | Admitting: Radiation Oncology

## 2014-02-18 ENCOUNTER — Telehealth: Payer: Self-pay | Admitting: Oncology

## 2014-02-18 ENCOUNTER — Encounter: Payer: Self-pay | Admitting: *Deleted

## 2014-02-18 ENCOUNTER — Telehealth: Payer: Self-pay | Admitting: *Deleted

## 2014-02-18 DIAGNOSIS — Z510 Encounter for antineoplastic radiation therapy: Secondary | ICD-10-CM | POA: Diagnosis not present

## 2014-02-18 NOTE — Telephone Encounter (Signed)
Per staff message and POF I have scheduled appts. Advised scheduler of appts. JMW  

## 2014-02-18 NOTE — Progress Notes (Signed)
Met Mr. Zielke when he arrived in wheelchair to [employer] for tmt.  He denied any needs or concerns. I explained to him that I would be scheduling his SCAT appts for next week, he verbalized understanding.  I escorted him to [facility] entrance afterwards for his SCAT pick-up.  [name], RN, BSN, [place] at [place] [phone]

## 2014-02-18 NOTE — Progress Notes (Signed)
Arranged patient's SCAT appointments for next week, provided him 2 copies, 1 of which he was instructed to provide his RN at [facility].  I provided [facility] a copy for their records, and sent via email a copy to Brantley Persons, Production assistant, radio, at [facility].  [name], RN, BSN, [place] at [place] [phone]

## 2014-02-18 NOTE — Telephone Encounter (Signed)
Pt confirmed labs sent msg to add chemo per 08/21 POF, gave pt AVS....KJ

## 2014-02-21 ENCOUNTER — Encounter: Payer: Self-pay | Admitting: Radiation Oncology

## 2014-02-21 ENCOUNTER — Ambulatory Visit
Admission: RE | Admit: 2014-02-21 | Discharge: 2014-02-21 | Disposition: A | Discharge status: Home / Self Care | Payer: Medicare Other | Source: Ambulatory Visit | Attending: Radiation Oncology | Admitting: Radiation Oncology

## 2014-02-21 VITALS — BP 119/78 | HR 91 | Temp 98.3°F | Resp 16 | Ht 66.0 in | Wt 104.1 lb

## 2014-02-21 DIAGNOSIS — C153 Malignant neoplasm of upper third of esophagus: Secondary | ICD-10-CM

## 2014-02-21 DIAGNOSIS — Z510 Encounter for antineoplastic radiation therapy: Secondary | ICD-10-CM | POA: Diagnosis not present

## 2014-02-21 DIAGNOSIS — C159 Malignant neoplasm of esophagus, unspecified: Secondary | ICD-10-CM

## 2014-02-21 MED ORDER — SUCRALFATE 1 GM/10ML PO SUSP: ORAL | Status: DC

## 2014-02-21 NOTE — Progress Notes (Signed)
Michael Keith has completed 21 fractions to his upper esophagus.  He denies pain.  He reports he can only swallow sips of water.  He reports his throat is sore and food comes right back up.  He is receiving Jevity in the mornings and at night through his peg tube.  He is also taking hycet twice a day through his peg tube.  He has lost 5 lbs since 02/15/14.  His peg tube has crusting around the flange.  He did not want it cleaned and said he would do it when he gets back to the nursing home.  He denies fatigue.  He is currently on 2L of oxygen via nasal canula with an oxygen saturation of 97%.  He reports having an occasional cough with white sputum. The skin on his neck/chest is intact.  He reports using "regular" lotion.  Advised him to just use biafine.

## 2014-02-21 NOTE — Progress Notes (Signed)
   Weekly Management Note:  outpatient    ICD-9-CM ICD-10-CM   1. 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  CBCT/MVCT images/Port film x-rays were reviewed.  The chart was checked. Soreness with swallowing. Weight loss. Tube feedings, continuous at night, in SNF  Physical Findings:  height is 5\' 6"  (1.676 m) and weight is 104 lb 1.6 oz (47.219 kg). His oral temperature is 98.3 F (36.8 C). His blood pressure is 119/78 and his pulse is 91. His respiration is 16 and oxygen saturation is 97%.  frail, in [place]. Skin is hyperpigmented over neck  CBC    Component Value Date/Time   WBC 3.6* 02/07/2014 0440   WBC 2.6* 01/25/2014 1220   RBC 3.15* 02/07/2014 0440   RBC 3.52* 01/25/2014 1220   RBC 3.79* 10/03/2009 0445   HGB 9.2* 02/07/2014 0440   HGB 10.1* 01/25/2014 1220   HCT 26.9* 02/07/2014 0440   HCT 31.2* 01/25/2014 1220   PLT 117* 02/07/2014 0440   PLT 126* 01/25/2014 1220   MCV 85.4 02/07/2014 0440   MCV 88.5 01/25/2014 1220   MCH 29.2 02/07/2014 0440   MCH 28.6 01/25/2014 1220   MCHC 34.2 02/07/2014 0440   MCHC 32.3 01/25/2014 1220   RDW 13.8 02/07/2014 0440   RDW 12.8 01/25/2014 1220   LYMPHSABS 0.3* 01/31/2014 1825   LYMPHSABS 0.6* 01/25/2014 1220   MONOABS 0.3 01/31/2014 1825   MONOABS 0.3 01/25/2014 1220   EOSABS 0.0 01/31/2014 1825   EOSABS 0.1 01/25/2014 1220   BASOSABS 0.0 01/31/2014 1825   BASOSABS 0.0 01/25/2014 1220     CMP     Component Value Date/Time   NA 137 02/07/2014 0440   NA 140 01/25/2014 1220   K 3.0* 02/07/2014 0440   K 4.3 01/25/2014 1220   CL 105 02/07/2014 0440   CO2 18* 02/07/2014 0440   CO2 23 01/25/2014 1220   GLUCOSE 142* 02/07/2014 0440   GLUCOSE 133 01/25/2014 1220   BUN 10 02/07/2014 0440   BUN 40.9* 01/25/2014 1220   CREATININE 1.03 02/07/2014 0440   CREATININE 1.8* 01/25/2014 1220   CALCIUM 8.2* 02/07/2014 0440   CALCIUM 9.6 01/25/2014 1220   PROT 6.5 02/07/2014 0440   PROT 8.1 01/25/2014 1220   ALBUMIN 2.5* 02/07/2014 0440   ALBUMIN 3.8 01/25/2014 1220   AST 28 02/07/2014 0440   AST 22 01/25/2014 1220   ALT 13 02/07/2014 0440   ALT 10 01/25/2014 1220   ALKPHOS 52 02/07/2014 0440   ALKPHOS 54 01/25/2014 1220   BILITOT 0.2* 02/07/2014 0440   BILITOT 0.50 01/25/2014 1220   GFRNONAA 75* 02/07/2014 0440   GFRAA 87* 02/07/2014 0440     Impression:  The patient is tolerating radiotherapy.   Plan:  Continue radiotherapy as planned. Infusion in med/onc tomorrow.  [name], RN, our Head and Neck Oncology Navigator will contact [place] to address weight loss - communication with SNF may be needed.  Rx for carafate given for soreness with swallowing.  -----------------------------------  Eppie Gibson, MD

## 2014-02-22 ENCOUNTER — Other Ambulatory Visit (HOSPITAL_BASED_OUTPATIENT_CLINIC_OR_DEPARTMENT_OTHER): Payer: Medicare Other

## 2014-02-22 ENCOUNTER — Ambulatory Visit
Admission: RE | Admit: 2014-02-22 | Discharge: 2014-02-22 | Disposition: A | Discharge status: Home / Self Care | Payer: Medicare Other | Source: Ambulatory Visit | Attending: Radiation Oncology | Admitting: Radiation Oncology

## 2014-02-22 ENCOUNTER — Ambulatory Visit (HOSPITAL_BASED_OUTPATIENT_CLINIC_OR_DEPARTMENT_OTHER): Payer: Medicare Other

## 2014-02-22 ENCOUNTER — Ambulatory Visit: Payer: Medicare Other

## 2014-02-22 VITALS — BP 107/45 | HR 51 | Temp 98.0°F

## 2014-02-22 DIAGNOSIS — C159 Malignant neoplasm of esophagus, unspecified: Secondary | ICD-10-CM

## 2014-02-22 DIAGNOSIS — C153 Malignant neoplasm of upper third of esophagus: Secondary | ICD-10-CM

## 2014-02-22 DIAGNOSIS — Z5111 Encounter for antineoplastic chemotherapy: Secondary | ICD-10-CM

## 2014-02-22 DIAGNOSIS — Z95828 Presence of other vascular implants and grafts: Secondary | ICD-10-CM

## 2014-02-22 DIAGNOSIS — Z510 Encounter for antineoplastic radiation therapy: Secondary | ICD-10-CM | POA: Diagnosis not present

## 2014-02-22 LAB — COMPREHENSIVE METABOLIC PANEL (CC13)
ALK PHOS: 75 U/L (ref 40–150)
ALT: 10 U/L (ref 0–55)
AST: 20 U/L (ref 5–34)
Albumin: 3 g/dL — ABNORMAL LOW (ref 3.5–5.0)
Anion Gap: 8 mEq/L (ref 3–11)
BUN: 18.9 mg/dL (ref 7.0–26.0)
CO2: 24 meq/L (ref 22–29)
CREATININE: 1 mg/dL (ref 0.7–1.3)
Calcium: 8.9 mg/dL (ref 8.4–10.4)
Chloride: 104 mEq/L (ref 98–109)
GLUCOSE: 133 mg/dL (ref 70–140)
Potassium: 4.6 mEq/L (ref 3.5–5.1)
Sodium: 136 mEq/L (ref 136–145)
Total Bilirubin: 0.2 mg/dL (ref 0.20–1.20)
Total Protein: 7.5 g/dL (ref 6.4–8.3)

## 2014-02-22 LAB — CBC WITH DIFFERENTIAL/PLATELET
BASO%: 0.3 % (ref 0.0–2.0)
Basophils Absolute: 0 10*3/uL (ref 0.0–0.1)
EOS%: 4.8 % (ref 0.0–7.0)
Eosinophils Absolute: 0.2 10*3/uL (ref 0.0–0.5)
HEMATOCRIT: 24.5 % — AB (ref 38.4–49.9)
HGB: 8.1 g/dL — ABNORMAL LOW (ref 13.0–17.1)
LYMPH%: 16.2 % (ref 14.0–49.0)
MCH: 30.1 pg (ref 27.2–33.4)
MCHC: 33.1 g/dL (ref 32.0–36.0)
MCV: 91 fL (ref 79.3–98.0)
MONO#: 0.7 10*3/uL (ref 0.1–0.9)
MONO%: 18.6 % — ABNORMAL HIGH (ref 0.0–14.0)
NEUT#: 2.1 10*3/uL (ref 1.5–6.5)
NEUT%: 60.1 % (ref 39.0–75.0)
PLATELETS: 183 10*3/uL (ref 140–400)
RBC: 2.69 10*6/uL — ABNORMAL LOW (ref 4.20–5.82)
RDW: 14.6 % (ref 11.0–14.6)
WBC: 3.5 10*3/uL — AB (ref 4.0–10.3)
lymph#: 0.6 10*3/uL — ABNORMAL LOW (ref 0.9–3.3)

## 2014-02-22 MED ORDER — FAMOTIDINE IN NACL 20-0.9 MG/50ML-% IV SOLN
20.0000 mg | Freq: Once | INTRAVENOUS | Status: AC
  Administered 2014-02-22: 20 mg via INTRAVENOUS

## 2014-02-22 MED ORDER — SODIUM CHLORIDE 0.9 % IJ SOLN
10.0000 mL | INTRAMUSCULAR | Status: DC | PRN
  Administered 2014-02-22: 10 mL
  Filled 2014-02-22: qty 10

## 2014-02-22 MED ORDER — ONDANSETRON 16 MG/50ML IVPB (CHCC)
INTRAVENOUS | Status: AC
  Filled 2014-02-22: qty 16

## 2014-02-22 MED ORDER — FAMOTIDINE IN NACL 20-0.9 MG/50ML-% IV SOLN
INTRAVENOUS | Status: AC
  Filled 2014-02-22: qty 50

## 2014-02-22 MED ORDER — PACLITAXEL CHEMO INJECTION 300 MG/50ML
45.0000 mg/m2 | Freq: Once | INTRAVENOUS | Status: AC
  Administered 2014-02-22: 66 mg via INTRAVENOUS
  Filled 2014-02-22: qty 11

## 2014-02-22 MED ORDER — HEPARIN SOD (PORK) LOCK FLUSH 100 UNIT/ML IV SOLN
500.0000 [IU] | Freq: Once | INTRAVENOUS | Status: AC | PRN
  Administered 2014-02-22: 500 [IU]
  Filled 2014-02-22: qty 5

## 2014-02-22 MED ORDER — DIPHENHYDRAMINE HCL 50 MG/ML IJ SOLN
50.0000 mg | Freq: Once | INTRAMUSCULAR | Status: AC
  Administered 2014-02-22: 50 mg via INTRAVENOUS

## 2014-02-22 MED ORDER — SODIUM CHLORIDE 0.9 % IJ SOLN
10.0000 mL | INTRAMUSCULAR | Status: DC | PRN
  Administered 2014-02-22: 10 mL via INTRAVENOUS
  Filled 2014-02-22: qty 10

## 2014-02-22 MED ORDER — ONDANSETRON 16 MG/50ML IVPB (CHCC)
16.0000 mg | Freq: Once | INTRAVENOUS | Status: AC
  Administered 2014-02-22: 16 mg via INTRAVENOUS

## 2014-02-22 MED ORDER — DIPHENHYDRAMINE HCL 50 MG/ML IJ SOLN
INTRAMUSCULAR | Status: AC
  Filled 2014-02-22: qty 1

## 2014-02-22 MED ORDER — DEXAMETHASONE SODIUM PHOSPHATE 20 MG/5ML IJ SOLN
20.0000 mg | Freq: Once | INTRAMUSCULAR | Status: AC
  Administered 2014-02-22: 20 mg via INTRAVENOUS

## 2014-02-22 MED ORDER — SODIUM CHLORIDE 0.9 % IV SOLN
149.4000 mg | Freq: Once | INTRAVENOUS | Status: AC
  Administered 2014-02-22: 150 mg via INTRAVENOUS
  Filled 2014-02-22: qty 15

## 2014-02-22 MED ORDER — DEXAMETHASONE SODIUM PHOSPHATE 20 MG/5ML IJ SOLN
INTRAMUSCULAR | Status: AC
  Filled 2014-02-22: qty 5

## 2014-02-22 MED ORDER — SODIUM CHLORIDE 0.9 % IV SOLN
Freq: Once | INTRAVENOUS | Status: AC
  Administered 2014-02-22: 11:00:00 via INTRAVENOUS

## 2014-02-22 NOTE — Patient Instructions (Signed)
[facility] Discharge Instructions for Patients Receiving Chemotherapy  Today you received the following chemotherapy agents taxol/carboplatin  To help prevent nausea and vomiting after your treatment, we encourage you to take your nausea medication as directed   If you develop nausea and vomiting that is not controlled by your nausea medication, call the clinic.   BELOW ARE SYMPTOMS THAT SHOULD BE REPORTED IMMEDIATELY:  *FEVER GREATER THAN 100.5 F  *CHILLS WITH OR WITHOUT FEVER  NAUSEA AND VOMITING THAT IS NOT CONTROLLED WITH YOUR NAUSEA MEDICATION  *UNUSUAL SHORTNESS OF BREATH  *UNUSUAL BRUISING OR BLEEDING  TENDERNESS IN MOUTH AND THROAT WITH OR WITHOUT PRESENCE OF ULCERS  *URINARY PROBLEMS  *BOWEL PROBLEMS  UNUSUAL RASH Items with * indicate a potential emergency and should be followed up as soon as possible.  Feel free to call the clinic you have any questions or concerns. The clinic phone number is [phone].  

## 2014-02-23 ENCOUNTER — Ambulatory Visit
Admission: RE | Admit: 2014-02-23 | Discharge: 2014-02-23 | Disposition: A | Discharge status: Home / Self Care | Payer: Medicare Other | Source: Ambulatory Visit | Attending: Radiation Oncology | Admitting: Radiation Oncology

## 2014-02-23 DIAGNOSIS — Z510 Encounter for antineoplastic radiation therapy: Secondary | ICD-10-CM | POA: Diagnosis not present

## 2014-02-24 ENCOUNTER — Telehealth: Payer: Self-pay | Admitting: *Deleted

## 2014-02-24 ENCOUNTER — Ambulatory Visit
Admission: RE | Admit: 2014-02-24 | Discharge: 2014-02-24 | Disposition: A | Discharge status: Home / Self Care | Payer: Medicare Other | Source: Ambulatory Visit | Attending: Radiation Oncology | Admitting: Radiation Oncology

## 2014-02-24 DIAGNOSIS — Z510 Encounter for antineoplastic radiation therapy: Secondary | ICD-10-CM | POA: Diagnosis not present

## 2014-02-24 NOTE — Telephone Encounter (Signed)
Spoke with [employer], Discharge [place], [facility].  Indication is that Mr. Hemric will complete his therapies 9/7 and will be discharged home 9/8.  As part of that DC, [facility] will make the appropriate referrals for home health, OT, PT as needed.  I emphasized the need to provide proper guidance so he maintains compliance in meeting nutritional needs; she indicated that would be addressed.  She offered that Mr. Alphia Moh, Rehab Mgr, can be contacted for information re: Mr. Klauer therapies [phone] ext [phone]).  I shared this information with Mr. Creque cousin, [name], who indicated he was aware of the DC plan.  [name], RN, BSN, [place] at [place] [phone]

## 2014-02-25 ENCOUNTER — Telehealth: Payer: Self-pay | Admitting: *Deleted

## 2014-02-25 ENCOUNTER — Ambulatory Visit
Admission: RE | Admit: 2014-02-25 | Discharge: 2014-02-25 | Disposition: A | Discharge status: Home / Self Care | Payer: Medicare Other | Source: Ambulatory Visit | Attending: Radiation Oncology | Admitting: Radiation Oncology

## 2014-02-25 ENCOUNTER — Encounter: Payer: Self-pay | Admitting: *Deleted

## 2014-02-25 DIAGNOSIS — R5383 Other fatigue: Secondary | ICD-10-CM | POA: Diagnosis not present

## 2014-02-25 DIAGNOSIS — R5381 Other malaise: Secondary | ICD-10-CM | POA: Diagnosis not present

## 2014-02-25 DIAGNOSIS — T80211A Bloodstream infection due to central venous catheter, initial encounter: Secondary | ICD-10-CM | POA: Diagnosis not present

## 2014-02-25 NOTE — Progress Notes (Unsigned)
Delivered copies of Mr. Szeto SCAT and Epic schedules for week of 02/28/14 to [facility] for their delivery to him when he arrives for RT this afternoon.  Emailed SCAT schedule to [facility], Therapist, art, at [facility] for their information.  [name], RN, BSN, [place] at [place] [phone]

## 2014-02-25 NOTE — Telephone Encounter (Signed)
Spoke with Katharine Look at [place], arranged Mr. Terpstra's appts for week of 02/28/14.  [name], RN, BSN, [place] at [place] [phone]

## 2014-02-27 ENCOUNTER — Emergency Department (HOSPITAL_COMMUNITY)
Admission: EM | Admit: 2014-02-27 | Discharge: 2014-02-27 | Disposition: A | Discharge status: Home / Self Care | Payer: Medicare Other | Source: Home / Self Care | Attending: Emergency Medicine | Admitting: Emergency Medicine

## 2014-02-27 ENCOUNTER — Emergency Department (HOSPITAL_COMMUNITY): Payer: Medicare Other

## 2014-02-27 ENCOUNTER — Encounter (HOSPITAL_COMMUNITY): Payer: Self-pay | Admitting: Emergency Medicine

## 2014-02-27 DIAGNOSIS — I472 Ventricular tachycardia, unspecified: Secondary | ICD-10-CM | POA: Diagnosis present

## 2014-02-27 DIAGNOSIS — Z9861 Coronary angioplasty status: Secondary | ICD-10-CM

## 2014-02-27 DIAGNOSIS — K861 Other chronic pancreatitis: Secondary | ICD-10-CM

## 2014-02-27 DIAGNOSIS — I2510 Atherosclerotic heart disease of native coronary artery without angina pectoris: Secondary | ICD-10-CM

## 2014-02-27 DIAGNOSIS — I4729 Other ventricular tachycardia: Secondary | ICD-10-CM | POA: Diagnosis present

## 2014-02-27 DIAGNOSIS — Z8501 Personal history of malignant neoplasm of esophagus: Secondary | ICD-10-CM

## 2014-02-27 DIAGNOSIS — Z923 Personal history of irradiation: Secondary | ICD-10-CM

## 2014-02-27 DIAGNOSIS — R627 Adult failure to thrive: Secondary | ICD-10-CM | POA: Diagnosis present

## 2014-02-27 DIAGNOSIS — I428 Other cardiomyopathies: Secondary | ICD-10-CM | POA: Diagnosis present

## 2014-02-27 DIAGNOSIS — Z862 Personal history of diseases of the blood and blood-forming organs and certain disorders involving the immune mechanism: Secondary | ICD-10-CM | POA: Insufficient documentation

## 2014-02-27 DIAGNOSIS — N182 Chronic kidney disease, stage 2 (mild): Secondary | ICD-10-CM | POA: Diagnosis present

## 2014-02-27 DIAGNOSIS — D638 Anemia in other chronic diseases classified elsewhere: Secondary | ICD-10-CM | POA: Diagnosis present

## 2014-02-27 DIAGNOSIS — I5022 Chronic systolic (congestive) heart failure: Secondary | ICD-10-CM

## 2014-02-27 DIAGNOSIS — E119 Type 2 diabetes mellitus without complications: Secondary | ICD-10-CM | POA: Diagnosis present

## 2014-02-27 DIAGNOSIS — Z7982 Long term (current) use of aspirin: Secondary | ICD-10-CM | POA: Insufficient documentation

## 2014-02-27 DIAGNOSIS — J449 Chronic obstructive pulmonary disease, unspecified: Secondary | ICD-10-CM | POA: Insufficient documentation

## 2014-02-27 DIAGNOSIS — Z79899 Other long term (current) drug therapy: Secondary | ICD-10-CM

## 2014-02-27 DIAGNOSIS — E860 Dehydration: Secondary | ICD-10-CM | POA: Diagnosis present

## 2014-02-27 DIAGNOSIS — J9620 Acute and chronic respiratory failure, unspecified whether with hypoxia or hypercapnia: Secondary | ICD-10-CM | POA: Diagnosis present

## 2014-02-27 DIAGNOSIS — Z8639 Personal history of other endocrine, nutritional and metabolic disease: Secondary | ICD-10-CM

## 2014-02-27 DIAGNOSIS — E43 Unspecified severe protein-calorie malnutrition: Secondary | ICD-10-CM | POA: Diagnosis present

## 2014-02-27 DIAGNOSIS — T80211A Bloodstream infection due to central venous catheter, initial encounter: Principal | ICD-10-CM | POA: Diagnosis present

## 2014-02-27 DIAGNOSIS — Z87891 Personal history of nicotine dependence: Secondary | ICD-10-CM | POA: Insufficient documentation

## 2014-02-27 DIAGNOSIS — J690 Pneumonitis due to inhalation of food and vomit: Secondary | ICD-10-CM | POA: Diagnosis present

## 2014-02-27 DIAGNOSIS — R040 Epistaxis: Secondary | ICD-10-CM | POA: Insufficient documentation

## 2014-02-27 DIAGNOSIS — I509 Heart failure, unspecified: Secondary | ICD-10-CM | POA: Diagnosis present

## 2014-02-27 DIAGNOSIS — Z931 Gastrostomy status: Secondary | ICD-10-CM

## 2014-02-27 DIAGNOSIS — A4101 Sepsis due to Methicillin susceptible Staphylococcus aureus: Secondary | ICD-10-CM | POA: Diagnosis present

## 2014-02-27 DIAGNOSIS — J441 Chronic obstructive pulmonary disease with (acute) exacerbation: Secondary | ICD-10-CM | POA: Diagnosis present

## 2014-02-27 DIAGNOSIS — D899 Disorder involving the immune mechanism, unspecified: Secondary | ICD-10-CM | POA: Diagnosis present

## 2014-02-27 DIAGNOSIS — R6520 Severe sepsis without septic shock: Secondary | ICD-10-CM

## 2014-02-27 DIAGNOSIS — C159 Malignant neoplasm of esophagus, unspecified: Secondary | ICD-10-CM | POA: Diagnosis present

## 2014-02-27 DIAGNOSIS — J4489 Other specified chronic obstructive pulmonary disease: Secondary | ICD-10-CM | POA: Insufficient documentation

## 2014-02-27 DIAGNOSIS — Z681 Body mass index (BMI) 19 or less, adult: Secondary | ICD-10-CM

## 2014-02-27 DIAGNOSIS — A419 Sepsis, unspecified organism: Secondary | ICD-10-CM | POA: Diagnosis present

## 2014-02-27 DIAGNOSIS — T502X5A Adverse effect of carbonic-anhydrase inhibitors, benzothiadiazides and other diuretics, initial encounter: Secondary | ICD-10-CM | POA: Diagnosis present

## 2014-02-27 DIAGNOSIS — F17200 Nicotine dependence, unspecified, uncomplicated: Secondary | ICD-10-CM | POA: Diagnosis present

## 2014-02-27 DIAGNOSIS — Z9221 Personal history of antineoplastic chemotherapy: Secondary | ICD-10-CM

## 2014-02-27 DIAGNOSIS — Z833 Family history of diabetes mellitus: Secondary | ICD-10-CM

## 2014-02-27 DIAGNOSIS — Y849 Medical procedure, unspecified as the cause of abnormal reaction of the patient, or of later complication, without mention of misadventure at the time of the procedure: Secondary | ICD-10-CM | POA: Diagnosis present

## 2014-02-27 DIAGNOSIS — N179 Acute kidney failure, unspecified: Secondary | ICD-10-CM | POA: Diagnosis present

## 2014-02-27 LAB — CBC WITH DIFFERENTIAL/PLATELET
Basophils Absolute: 0 10*3/uL (ref 0.0–0.1)
Basophils Relative: 0 % (ref 0–1)
EOS ABS: 0.1 10*3/uL (ref 0.0–0.7)
Eosinophils Relative: 4 % (ref 0–5)
HEMATOCRIT: 22.8 % — AB (ref 39.0–52.0)
Hemoglobin: 7.5 g/dL — ABNORMAL LOW (ref 13.0–17.0)
Lymphocytes Relative: 20 % (ref 12–46)
Lymphs Abs: 0.5 10*3/uL — ABNORMAL LOW (ref 0.7–4.0)
MCH: 29.5 pg (ref 26.0–34.0)
MCHC: 32.9 g/dL (ref 30.0–36.0)
MCV: 89.8 fL (ref 78.0–100.0)
MONO ABS: 0.5 10*3/uL (ref 0.1–1.0)
Monocytes Relative: 17 % — ABNORMAL HIGH (ref 3–12)
NEUTROS ABS: 1.6 10*3/uL — AB (ref 1.7–7.7)
Neutrophils Relative %: 59 % (ref 43–77)
PLATELETS: 185 10*3/uL (ref 150–400)
RBC: 2.54 MIL/uL — ABNORMAL LOW (ref 4.22–5.81)
RDW: 15.4 % (ref 11.5–15.5)
WBC: 2.7 10*3/uL — ABNORMAL LOW (ref 4.0–10.5)

## 2014-02-27 LAB — COMPREHENSIVE METABOLIC PANEL
ALT: 10 U/L (ref 0–53)
AST: 18 U/L (ref 0–37)
Albumin: 3 g/dL — ABNORMAL LOW (ref 3.5–5.2)
Alkaline Phosphatase: 68 U/L (ref 39–117)
Anion gap: 11 (ref 5–15)
BILIRUBIN TOTAL: 0.4 mg/dL (ref 0.3–1.2)
BUN: 25 mg/dL — AB (ref 6–23)
CALCIUM: 9.1 mg/dL (ref 8.4–10.5)
CO2: 24 mEq/L (ref 19–32)
CREATININE: 1.01 mg/dL (ref 0.50–1.35)
Chloride: 101 mEq/L (ref 96–112)
GFR calc Af Amer: 89 mL/min — ABNORMAL LOW (ref >90)
GFR calc non Af Amer: 77 mL/min — ABNORMAL LOW (ref >90)
Glucose, Bld: 127 mg/dL — ABNORMAL HIGH (ref 70–99)
Potassium: 4.8 mEq/L (ref 3.7–5.3)
Sodium: 136 mEq/L — ABNORMAL LOW (ref 137–147)
Total Protein: 7.2 g/dL (ref 6.0–8.3)

## 2014-02-27 MED ORDER — SODIUM CHLORIDE 0.9 % IV SOLN
INTRAVENOUS | Status: DC
  Administered 2014-02-27: 17:00:00 via INTRAVENOUS

## 2014-02-27 MED ORDER — HEPARIN SOD (PORK) LOCK FLUSH 100 UNIT/ML IV SOLN
500.0000 [IU] | Freq: Once | INTRAVENOUS | Status: AC
  Administered 2014-02-27: 500 [IU]
  Filled 2014-02-27: qty 5

## 2014-02-27 NOTE — ED Provider Notes (Signed)
CSN: [id number]     Arrival date & time 02/27/14  1428 History   First MD Initiated Contact with Patient 02/27/14 1546     Chief Complaint  Patient presents with  . Epistaxis     (Consider location/radiation/quality/duration/timing/severity/associated sxs/prior Treatment) HPI Comments: Patient here after having a nosebleed that began when he took off his nasal cannula oxygen. Blood came out of the right nares and 4 hours ago and no bleeding since. Patient did cough up he says a small amount of blood but denies any hematemesis. Denies any black or bloody stools. Says he feels at his baseline at this time. No syncope or near-syncope. Has had sinus problems in the past. Denies any facial discomfort or headache. Symptoms resolved spontaneously. Nursing home sent patient here for evaluation due to his history of esophageal cancer.  Patient is a 63 y.o. male presenting with nosebleeds. The history is provided by the patient.  Epistaxis   Past Medical History  Diagnosis Date  . Chronic pancreatitis     CT scan shows improving pancreatitis  . Nonischemic cardiomyopathy 09/2009    Catheterization, April, 2011, questionable occlusion of the most apical portion of the LAD, LV dysfunction out of proportion to coronary disease  . Chronic systolic CHF (congestive heart failure)     April, 2011 mild troponin elevation at that time  . Mitral regurgitation     mild, echo April 2011  . Tobacco abuse   . Alcohol use     heavy until Jan 2011, patient was counseled concerning alcohol in April 2011  . COPD (chronic obstructive pulmonary disease)   . Fluid overload 03/2010    October, 2011  . Cough Jan 2012    may be from enalapril, January, 2012  . Ejection fraction < 50%     EF 35-40%, echo, April, 2011  . CAD (coronary artery disease)     Catheterization, April, 2011, questionable occlusion of the most apical portion of the LAD, LV dysfunction out of proportion to coronary disease  . Squamous cell  esophageal cancer   . Severe protein-calorie malnutrition 01/31/2014   Past Surgical History  Procedure Laterality Date  . Cardiac catheterization  April 2011    questionable occlusion of the most apical portion of the LAD / LV dysfunction out of proportion to coronary disease  . Wrist surgey      left wrist  . Laparoscopic gastrostomy N/A 02/02/2014    Procedure: LAPAROSCOPIC GASTROSTOMY TUBE PLACEMENT;  Surgeon: Pedro Earls, MD;  Location: [facility] ORS;  Service: General;  Laterality: N/A;   Family History  Problem Relation Age of Onset  . Diabetes Cousin   . Cancer Mother    History  Substance Use Topics  . Smoking status: Former Smoker -- 0.50 packs/day for 30 years    Types: Cigarettes    Quit date: 01/19/2014  . Smokeless tobacco: Never Used     Comment: smoking 3 cigarettes per day  . Alcohol Use: No     Comment: heavy untill Jan 2011    Review of Systems  HENT: Positive for nosebleeds.   All other systems reviewed and are negative.     Allergies  Review of patient's allergies indicates no known allergies.  Home Medications   Prior to Admission medications   Medication Sig Start Date End Date Taking? Authorizing Provider  aspirin EC 81 MG tablet 81 mg by PEG Tube route every morning.    Yes Historical Provider, MD  HYDROcodone-acetaminophen (HYCET) 7.5-325 mg/15 ml  solution 15 mLs by PEG Tube route every 6 (six) hours as needed (FOR PAIN RELATED TO MALIGNANT NEOPLASM OF ESOPHAGUS).    Yes Historical Provider, MD  lidocaine-prilocaine (EMLA) cream Apply 1 application topically as needed (port access.). 01/20/14  Yes Wyatt Portela, MD  metoCLOPramide (REGLAN) 5 MG tablet 5 mg by PEG Tube route every 8 (eight) hours as needed for nausea.    Yes Historical Provider, MD  Multiple Vitamin (MULTIVITAMIN WITH MINERALS) TABS tablet 1 tablet by PEG Tube route daily.    Yes Historical Provider, MD  nicotine (NICODERM CQ - DOSED IN MG/24 HOURS) 21 mg/24hr patch Place 21 mg onto  the skin daily.   Yes Historical Provider, MD  nitroGLYCERIN (NITROSTAT) 0.4 MG SL tablet Place 0.4 mg under the tongue every 5 (five) minutes x 3 doses as needed for chest pain.  01/27/14  Yes Carlena Bjornstad, MD  Nutritional Supplements (FEEDING SUPPLEMENT, OSMOLITE 1.5 CAL,) LIQD 1,000 mLs by PEG Tube route continuous. 115 ml/hr  For 10 hrs  (8pm-6am)   Yes Historical Provider, MD  Omeprazole-Sodium Bicarbonate 20-1680 MG PACK 1 Package by PEG Tube route daily.   Yes Historical Provider, MD  ondansetron (ZOFRAN-ODT) 4 MG disintegrating tablet 4 mg by PEG Tube route every 8 (eight) hours as needed for nausea or vomiting.    Yes Historical Provider, MD  PRESCRIPTION MEDICATION 120 mLs by PEG Tube route 3 (three) times daily. Med plus   Yes Historical Provider, MD  sucralfate (CARAFATE) 1 GM/10ML suspension 1 g by PEG Tube route 3 (three) times daily. FOR GASTRIC PROTECTION   Yes Historical Provider, MD  terazosin (HYTRIN) 1 MG capsule 1 mg by PEG Tube route at bedtime.    Yes Historical Provider, MD  thiamine (VITAMIN B-1) 100 MG tablet 100 mg by PEG Tube route daily.    Yes Historical Provider, MD  Water For Irrigation, Sterile (STERILE WATER FOR IRRIGATION) Irrigate with 360 mLs as directed every 8 (eight) hours.   Yes Historical Provider, MD   BP 126/74  Pulse 48  Temp(Src) 98.7 F (37.1 C)  Resp 21  SpO2 100% Physical Exam  Nursing note and vitals reviewed. Constitutional: He is oriented to person, place, and time. He appears well-developed and well-nourished.  Non-toxic appearance. No distress.  HENT:  Head: Normocephalic and atraumatic.  Nose: No nasal septal hematoma. No epistaxis.  Eyes: Conjunctivae, EOM and lids are normal. Pupils are equal, round, and reactive to light.  Neck: Normal range of motion. Neck supple. No tracheal deviation present. No mass present.  Cardiovascular: Normal rate, regular rhythm and normal heart sounds.  Exam reveals no gallop.   No murmur  heard. Pulmonary/Chest: Effort normal and breath sounds normal. No stridor. No respiratory distress. He has no decreased breath sounds. He has no wheezes. He has no rhonchi. He has no rales.  Abdominal: Soft. Normal appearance and bowel sounds are normal. He exhibits no distension. There is no tenderness. There is no rebound and no CVA tenderness.  Musculoskeletal: Normal range of motion. He exhibits no edema and no tenderness.  Neurological: He is alert and oriented to person, place, and time. He has normal strength. No cranial nerve deficit or sensory deficit. GCS eye subscore is 4. GCS verbal subscore is 5. GCS motor subscore is 6.  Skin: Skin is warm and dry. No abrasion and no rash noted.  Psychiatric: He has a normal mood and affect. His speech is normal and behavior is normal.  ED Course  Procedures (including critical care time) Labs Review Labs Reviewed  CBC WITH DIFFERENTIAL  COMPREHENSIVE METABOLIC PANEL  PROTIME-INR  APTT  TYPE AND SCREEN    Imaging Review No results found.   EKG Interpretation None      MDM   Final diagnoses:  None    Patient monitored here and no evidence of active bleeding noted. Patient's hemoglobin noted and is not significantly different from his baseline. Patient stable for discharge to home   Leota Jacobsen, MD 02/27/14 1737

## 2014-02-27 NOTE — ED Notes (Signed)
Per EMS: pt. Reports from Michael Keith with reports of vomiting blood at 11 am. Pt. States symptoms lasted about 5 min. Hx of malignant neoplasm of esophagus. Pt. Not sure whether blood is from malignant area or nosebleed. Vital signs: 130/60 BP 70 HR

## 2014-02-27 NOTE — ED Notes (Signed)
[facility] notified about transport back to facility.

## 2014-02-27 NOTE — Discharge Instructions (Signed)
Nosebleed °A nosebleed can be caused by many things, including: °· Getting hit hard in the nose. °· Infections. °· Dry nose. °· Colds. °· Medicines. °Your doctor may do lab testing if you get nosebleeds a lot and the cause is not known. °HOME CARE  °· If your nose was packed with material, keep it there until your doctor takes it out. Put the pack back in your nose if the pack falls out. °· Do not blow your nose for 12 hours after the nosebleed. °· Sit up and bend forward if your nose starts bleeding again. Pinch the front half of your nose nonstop for 20 minutes. °· Put petroleum jelly inside your nose every morning if you have a dry nose. °· Use a humidifier to make the air less dry. °· Do not take aspirin. °· Try not to strain, lift, or bend at the waist for many days after the nosebleed. °GET HELP RIGHT AWAY IF:  °· Nosebleeds keep happening and are hard to stop or control. °· You have bleeding or bruises that are not normal on other parts of the body. °· You have a fever. °· The nosebleeds get worse. °· You get lightheaded, feel faint, sweaty, or throw up (vomit) blood. °MAKE SURE YOU:  °· Understand these instructions. °· Will watch your condition. °· Will get help right away if you are not doing well or get worse. °Document Released: 03/26/2008 Document Revised: 09/09/2011 Document Reviewed: 03/26/2008 °ExitCare® Patient Information ©2015 [employer]. This information is not intended to replace advice given to you by your health care provider. Make sure you discuss any questions you have with your health care provider. ° °

## 2014-02-27 NOTE — ED Notes (Signed)
Bed: KJ17 Expected date: 02/27/14 Expected time: 2:18 PM Means of arrival: Ambulance Comments: Earlier nosebleed, controlled, nursing home wants pt checked

## 2014-02-28 ENCOUNTER — Ambulatory Visit
Admission: RE | Admit: 2014-02-28 | Discharge: 2014-02-28 | Disposition: A | Discharge status: Home / Self Care | Payer: Medicare Other | Source: Ambulatory Visit | Attending: Radiation Oncology | Admitting: Radiation Oncology

## 2014-02-28 ENCOUNTER — Encounter: Payer: Self-pay | Admitting: Radiation Oncology

## 2014-02-28 ENCOUNTER — Telehealth: Payer: Self-pay | Admitting: Radiation Oncology

## 2014-02-28 ENCOUNTER — Emergency Department (HOSPITAL_COMMUNITY): Payer: Medicare Other

## 2014-02-28 ENCOUNTER — Ambulatory Visit: Payer: Medicare Other

## 2014-02-28 ENCOUNTER — Inpatient Hospital Stay (HOSPITAL_COMMUNITY)
Admission: EM | Admit: 2014-02-28 | Discharge: 2014-03-08 | DRG: 314 | Disposition: A | Discharge status: Skilled Nursing Facility | Payer: Medicare Other | Attending: Internal Medicine | Admitting: Internal Medicine

## 2014-02-28 ENCOUNTER — Encounter (HOSPITAL_COMMUNITY): Payer: Self-pay | Admitting: Emergency Medicine

## 2014-02-28 VITALS — BP 112/67 | HR 130 | Temp 98.1°F | Resp 24 | Ht 66.0 in | Wt 103.9 lb

## 2014-02-28 DIAGNOSIS — Z9221 Personal history of antineoplastic chemotherapy: Secondary | ICD-10-CM | POA: Diagnosis not present

## 2014-02-28 DIAGNOSIS — R627 Adult failure to thrive: Secondary | ICD-10-CM

## 2014-02-28 DIAGNOSIS — I5022 Chronic systolic (congestive) heart failure: Secondary | ICD-10-CM | POA: Diagnosis present

## 2014-02-28 DIAGNOSIS — J9620 Acute and chronic respiratory failure, unspecified whether with hypoxia or hypercapnia: Secondary | ICD-10-CM | POA: Diagnosis present

## 2014-02-28 DIAGNOSIS — I272 Pulmonary hypertension, unspecified: Secondary | ICD-10-CM

## 2014-02-28 DIAGNOSIS — F1090 Alcohol use, unspecified, uncomplicated: Secondary | ICD-10-CM

## 2014-02-28 DIAGNOSIS — J189 Pneumonia, unspecified organism: Secondary | ICD-10-CM | POA: Diagnosis present

## 2014-02-28 DIAGNOSIS — Z95828 Presence of other vascular implants and grafts: Secondary | ICD-10-CM

## 2014-02-28 DIAGNOSIS — C153 Malignant neoplasm of upper third of esophagus: Secondary | ICD-10-CM | POA: Diagnosis present

## 2014-02-28 DIAGNOSIS — R5381 Other malaise: Secondary | ICD-10-CM | POA: Diagnosis present

## 2014-02-28 DIAGNOSIS — J441 Chronic obstructive pulmonary disease with (acute) exacerbation: Secondary | ICD-10-CM | POA: Diagnosis present

## 2014-02-28 DIAGNOSIS — Y849 Medical procedure, unspecified as the cause of abnormal reaction of the patient, or of later complication, without mention of misadventure at the time of the procedure: Secondary | ICD-10-CM | POA: Diagnosis present

## 2014-02-28 DIAGNOSIS — Z7982 Long term (current) use of aspirin: Secondary | ICD-10-CM | POA: Diagnosis not present

## 2014-02-28 DIAGNOSIS — Z79899 Other long term (current) drug therapy: Secondary | ICD-10-CM | POA: Diagnosis not present

## 2014-02-28 DIAGNOSIS — Z789 Other specified health status: Secondary | ICD-10-CM

## 2014-02-28 DIAGNOSIS — R4702 Dysphasia: Secondary | ICD-10-CM

## 2014-02-28 DIAGNOSIS — Z862 Personal history of diseases of the blood and blood-forming organs and certain disorders involving the immune mechanism: Secondary | ICD-10-CM | POA: Diagnosis not present

## 2014-02-28 DIAGNOSIS — Z720 Tobacco use: Secondary | ICD-10-CM

## 2014-02-28 DIAGNOSIS — E119 Type 2 diabetes mellitus without complications: Secondary | ICD-10-CM | POA: Diagnosis present

## 2014-02-28 DIAGNOSIS — C159 Malignant neoplasm of esophagus, unspecified: Secondary | ICD-10-CM | POA: Diagnosis present

## 2014-02-28 DIAGNOSIS — R05 Cough: Secondary | ICD-10-CM

## 2014-02-28 DIAGNOSIS — Z8501 Personal history of malignant neoplasm of esophagus: Secondary | ICD-10-CM | POA: Diagnosis not present

## 2014-02-28 DIAGNOSIS — I509 Heart failure, unspecified: Secondary | ICD-10-CM | POA: Diagnosis present

## 2014-02-28 DIAGNOSIS — R040 Epistaxis: Secondary | ICD-10-CM | POA: Diagnosis present

## 2014-02-28 DIAGNOSIS — J690 Pneumonitis due to inhalation of food and vomit: Secondary | ICD-10-CM | POA: Diagnosis present

## 2014-02-28 DIAGNOSIS — T80211A Bloodstream infection due to central venous catheter, initial encounter: Secondary | ICD-10-CM | POA: Diagnosis present

## 2014-02-28 DIAGNOSIS — R059 Cough, unspecified: Secondary | ICD-10-CM

## 2014-02-28 DIAGNOSIS — A4101 Sepsis due to Methicillin susceptible Staphylococcus aureus: Secondary | ICD-10-CM | POA: Diagnosis present

## 2014-02-28 DIAGNOSIS — J449 Chronic obstructive pulmonary disease, unspecified: Secondary | ICD-10-CM | POA: Diagnosis present

## 2014-02-28 DIAGNOSIS — D649 Anemia, unspecified: Secondary | ICD-10-CM | POA: Diagnosis present

## 2014-02-28 DIAGNOSIS — I428 Other cardiomyopathies: Secondary | ICD-10-CM | POA: Diagnosis present

## 2014-02-28 DIAGNOSIS — I340 Nonrheumatic mitral (valve) insufficiency: Secondary | ICD-10-CM | POA: Diagnosis present

## 2014-02-28 DIAGNOSIS — F17200 Nicotine dependence, unspecified, uncomplicated: Secondary | ICD-10-CM | POA: Diagnosis present

## 2014-02-28 DIAGNOSIS — K861 Other chronic pancreatitis: Secondary | ICD-10-CM | POA: Diagnosis present

## 2014-02-28 DIAGNOSIS — E43 Unspecified severe protein-calorie malnutrition: Secondary | ICD-10-CM | POA: Diagnosis present

## 2014-02-28 DIAGNOSIS — J432 Centrilobular emphysema: Secondary | ICD-10-CM

## 2014-02-28 DIAGNOSIS — N179 Acute kidney failure, unspecified: Secondary | ICD-10-CM | POA: Diagnosis present

## 2014-02-28 DIAGNOSIS — I472 Ventricular tachycardia: Secondary | ICD-10-CM

## 2014-02-28 DIAGNOSIS — Z923 Personal history of irradiation: Secondary | ICD-10-CM | POA: Diagnosis not present

## 2014-02-28 DIAGNOSIS — I4729 Other ventricular tachycardia: Secondary | ICD-10-CM | POA: Diagnosis present

## 2014-02-28 DIAGNOSIS — D638 Anemia in other chronic diseases classified elsewhere: Secondary | ICD-10-CM | POA: Diagnosis present

## 2014-02-28 DIAGNOSIS — R778 Other specified abnormalities of plasma proteins: Secondary | ICD-10-CM

## 2014-02-28 DIAGNOSIS — I2510 Atherosclerotic heart disease of native coronary artery without angina pectoris: Secondary | ICD-10-CM | POA: Diagnosis present

## 2014-02-28 DIAGNOSIS — Z9861 Coronary angioplasty status: Secondary | ICD-10-CM | POA: Diagnosis not present

## 2014-02-28 DIAGNOSIS — R6520 Severe sepsis without septic shock: Secondary | ICD-10-CM | POA: Diagnosis present

## 2014-02-28 DIAGNOSIS — Z833 Family history of diabetes mellitus: Secondary | ICD-10-CM | POA: Diagnosis not present

## 2014-02-28 DIAGNOSIS — J418 Mixed simple and mucopurulent chronic bronchitis: Secondary | ICD-10-CM

## 2014-02-28 DIAGNOSIS — R0789 Other chest pain: Secondary | ICD-10-CM

## 2014-02-28 DIAGNOSIS — J9621 Acute and chronic respiratory failure with hypoxia: Secondary | ICD-10-CM

## 2014-02-28 DIAGNOSIS — D899 Disorder involving the immune mechanism, unspecified: Secondary | ICD-10-CM | POA: Diagnosis present

## 2014-02-28 DIAGNOSIS — N289 Disorder of kidney and ureter, unspecified: Secondary | ICD-10-CM | POA: Diagnosis not present

## 2014-02-28 DIAGNOSIS — Z681 Body mass index (BMI) 19 or less, adult: Secondary | ICD-10-CM | POA: Diagnosis not present

## 2014-02-28 DIAGNOSIS — Z931 Gastrostomy status: Secondary | ICD-10-CM | POA: Diagnosis not present

## 2014-02-28 DIAGNOSIS — R7989 Other specified abnormal findings of blood chemistry: Secondary | ICD-10-CM

## 2014-02-28 DIAGNOSIS — T502X5A Adverse effect of carbonic-anhydrase inhibitors, benzothiadiazides and other diuretics, initial encounter: Secondary | ICD-10-CM | POA: Diagnosis present

## 2014-02-28 DIAGNOSIS — N182 Chronic kidney disease, stage 2 (mild): Secondary | ICD-10-CM | POA: Diagnosis present

## 2014-02-28 DIAGNOSIS — Z7289 Other problems related to lifestyle: Secondary | ICD-10-CM

## 2014-02-28 DIAGNOSIS — J9601 Acute respiratory failure with hypoxia: Secondary | ICD-10-CM

## 2014-02-28 DIAGNOSIS — D72819 Decreased white blood cell count, unspecified: Secondary | ICD-10-CM | POA: Diagnosis present

## 2014-02-28 DIAGNOSIS — E860 Dehydration: Secondary | ICD-10-CM | POA: Diagnosis present

## 2014-02-28 DIAGNOSIS — Z01810 Encounter for preprocedural cardiovascular examination: Secondary | ICD-10-CM

## 2014-02-28 DIAGNOSIS — Z8639 Personal history of other endocrine, nutritional and metabolic disease: Secondary | ICD-10-CM | POA: Diagnosis not present

## 2014-02-28 LAB — COMPREHENSIVE METABOLIC PANEL
ALBUMIN: 3.1 g/dL — AB (ref 3.5–5.2)
ALT: 11 U/L (ref 0–53)
AST: 22 U/L (ref 0–37)
Alkaline Phosphatase: 71 U/L (ref 39–117)
Anion gap: 17 — ABNORMAL HIGH (ref 5–15)
BUN: 27 mg/dL — ABNORMAL HIGH (ref 6–23)
CO2: 21 mEq/L (ref 19–32)
Calcium: 9.1 mg/dL (ref 8.4–10.5)
Chloride: 96 mEq/L (ref 96–112)
Creatinine, Ser: 1.15 mg/dL (ref 0.50–1.35)
GFR calc Af Amer: 76 mL/min — ABNORMAL LOW (ref >90)
GFR calc non Af Amer: 65 mL/min — ABNORMAL LOW (ref >90)
Glucose, Bld: 174 mg/dL — ABNORMAL HIGH (ref 70–99)
POTASSIUM: 4.4 meq/L (ref 3.7–5.3)
Sodium: 134 mEq/L — ABNORMAL LOW (ref 137–147)
Total Bilirubin: 0.6 mg/dL (ref 0.3–1.2)
Total Protein: 7.7 g/dL (ref 6.0–8.3)

## 2014-02-28 LAB — CBC
HCT: 19.7 % — ABNORMAL LOW (ref 39.0–52.0)
Hemoglobin: 6.7 g/dL — CL (ref 13.0–17.0)
MCH: 30.2 pg (ref 26.0–34.0)
MCHC: 34 g/dL (ref 30.0–36.0)
MCV: 88.7 fL (ref 78.0–100.0)
Platelets: 167 10*3/uL (ref 150–400)
RBC: 2.22 MIL/uL — ABNORMAL LOW (ref 4.22–5.81)
RDW: 15.5 % (ref 11.5–15.5)
WBC: 6.4 10*3/uL (ref 4.0–10.5)

## 2014-02-28 LAB — CBC WITH DIFFERENTIAL/PLATELET
BASOS ABS: 0 10*3/uL (ref 0.0–0.1)
Basophils Relative: 0 % (ref 0–1)
Eosinophils Absolute: 0 10*3/uL (ref 0.0–0.7)
Eosinophils Relative: 0 % (ref 0–5)
HEMATOCRIT: 22.2 % — AB (ref 39.0–52.0)
Hemoglobin: 7.4 g/dL — ABNORMAL LOW (ref 13.0–17.0)
LYMPHS PCT: 5 % — AB (ref 12–46)
Lymphs Abs: 0.3 10*3/uL — ABNORMAL LOW (ref 0.7–4.0)
MCH: 29.7 pg (ref 26.0–34.0)
MCHC: 33.3 g/dL (ref 30.0–36.0)
MCV: 89.2 fL (ref 78.0–100.0)
MONOS PCT: 10 % (ref 3–12)
Monocytes Absolute: 0.6 10*3/uL (ref 0.1–1.0)
NEUTROS ABS: 5.5 10*3/uL (ref 1.7–7.7)
Neutrophils Relative %: 85 % — ABNORMAL HIGH (ref 43–77)
Platelets: 185 10*3/uL (ref 150–400)
RBC: 2.49 MIL/uL — ABNORMAL LOW (ref 4.22–5.81)
RDW: 15.4 % (ref 11.5–15.5)
WBC Morphology: INCREASED
WBC: 6.4 10*3/uL (ref 4.0–10.5)

## 2014-02-28 LAB — I-STAT TROPONIN, ED: Troponin i, poc: 0.4 ng/mL (ref 0.00–0.08)

## 2014-02-28 LAB — PREPARE RBC (CROSSMATCH)

## 2014-02-28 LAB — CREATININE, SERUM
CREATININE: 1.17 mg/dL (ref 0.50–1.35)
GFR calc Af Amer: 74 mL/min — ABNORMAL LOW (ref >90)
GFR calc non Af Amer: 64 mL/min — ABNORMAL LOW (ref >90)

## 2014-02-28 LAB — PRO B NATRIURETIC PEPTIDE: Pro B Natriuretic peptide (BNP): 29729 pg/mL — ABNORMAL HIGH (ref 0–125)

## 2014-02-28 LAB — TROPONIN I
TROPONIN I: 0.55 ng/mL — AB (ref <0.30)
Troponin I: <0.3 ng/mL (ref <0.30)

## 2014-02-28 LAB — TSH: TSH: 0.554 u[IU]/mL (ref 0.350–4.500)

## 2014-02-28 LAB — LACTIC ACID, PLASMA: LACTIC ACID, VENOUS: 1.8 mmol/L (ref 0.5–2.2)

## 2014-02-28 LAB — MRSA PCR SCREENING: MRSA BY PCR: NEGATIVE

## 2014-02-28 MED ORDER — TERAZOSIN HCL 1 MG PO CAPS
1.0000 mg | ORAL_CAPSULE | Freq: Every day | ORAL | Status: DC
  Administered 2014-03-02 – 2014-03-07 (×6): 1 mg
  Filled 2014-02-28 (×10): qty 1

## 2014-02-28 MED ORDER — ACETAMINOPHEN 325 MG PO TABS
650.0000 mg | ORAL_TABLET | Freq: Four times a day (QID) | ORAL | Status: DC | PRN
  Administered 2014-02-28: 650 mg via ORAL
  Filled 2014-02-28: qty 2

## 2014-02-28 MED ORDER — MORPHINE SULFATE 2 MG/ML IJ SOLN
1.0000 mg | INTRAMUSCULAR | Status: DC | PRN
Start: 2014-02-28 — End: 2014-03-08

## 2014-02-28 MED ORDER — NICOTINE 21 MG/24HR TD PT24
21.0000 mg | MEDICATED_PATCH | Freq: Every day | TRANSDERMAL | Status: DC
  Administered 2014-03-01 – 2014-03-08 (×8): 21 mg via TRANSDERMAL
  Filled 2014-02-28 (×8): qty 1

## 2014-02-28 MED ORDER — ACETAMINOPHEN 650 MG RE SUPP: 650.0000 mg | Freq: Four times a day (QID) | RECTAL | Status: DC | PRN

## 2014-02-28 MED ORDER — LIDOCAINE-PRILOCAINE 2.5-2.5 % EX CREA
1.0000 | TOPICAL_CREAM | CUTANEOUS | Status: DC | PRN
Start: 2014-02-28 — End: 2014-03-08
  Filled 2014-02-28: qty 5

## 2014-02-28 MED ORDER — SODIUM CHLORIDE 0.9 % IV SOLN
INTRAVENOUS | Status: DC
  Administered 2014-02-28: 21:00:00 via INTRAVENOUS

## 2014-02-28 MED ORDER — HEPARIN SODIUM (PORCINE) 5000 UNIT/ML IJ SOLN
5000.0000 [IU] | Freq: Three times a day (TID) | INTRAMUSCULAR | Status: DC
  Administered 2014-02-28 – 2014-03-08 (×23): 5000 [IU] via SUBCUTANEOUS
  Filled 2014-02-28 (×25): qty 1

## 2014-02-28 MED ORDER — SODIUM CHLORIDE 0.9 % IV SOLN: Freq: Once | INTRAVENOUS | Status: DC

## 2014-02-28 MED ORDER — NITROGLYCERIN 0.4 MG SL SUBL: 0.4000 mg | SUBLINGUAL_TABLET | SUBLINGUAL | Status: DC | PRN

## 2014-02-28 MED ORDER — OSMOLITE 1.5 CAL PO LIQD
1000.0000 mL | ORAL | Status: DC
  Administered 2014-02-28: 22:00:00
  Administered 2014-03-01 – 2014-03-03 (×3): 1000 mL
  Filled 2014-02-28 (×5): qty 1000

## 2014-02-28 MED ORDER — PIPERACILLIN-TAZOBACTAM 3.375 G IVPB
3.3750 g | Freq: Three times a day (TID) | INTRAVENOUS | Status: DC
  Administered 2014-03-01 – 2014-03-03 (×8): 3.375 g via INTRAVENOUS
  Filled 2014-02-28 (×9): qty 50

## 2014-02-28 MED ORDER — FOLIC ACID 1 MG PO TABS
1.0000 mg | ORAL_TABLET | Freq: Every day | ORAL | Status: DC
  Administered 2014-02-28 – 2014-03-08 (×9): 1 mg
  Filled 2014-02-28 (×9): qty 1

## 2014-02-28 MED ORDER — PIPERACILLIN-TAZOBACTAM 3.375 G IVPB: 3.3750 g | Freq: Once | INTRAVENOUS | Status: DC

## 2014-02-28 MED ORDER — STERILE WATER FOR IRRIGATION IR SOLN
360.0000 mL | Freq: Three times a day (TID) | Status: DC
Start: 2014-02-28 — End: 2014-03-08
  Administered 2014-02-28 – 2014-03-08 (×23): 360 mL

## 2014-02-28 MED ORDER — VANCOMYCIN HCL IN DEXTROSE 1-5 GM/200ML-% IV SOLN
1000.0000 mg | INTRAVENOUS | Status: DC
  Administered 2014-03-01 – 2014-03-02 (×2): 1000 mg via INTRAVENOUS
  Filled 2014-02-28: qty 200

## 2014-02-28 MED ORDER — ADULT MULTIVITAMIN W/MINERALS CH
1.0000 | ORAL_TABLET | Freq: Every day | ORAL | Status: DC
  Administered 2014-02-28 – 2014-03-08 (×9): 1
  Filled 2014-02-28 (×10): qty 1

## 2014-02-28 MED ORDER — VITAMIN B-1 100 MG PO TABS
100.0000 mg | ORAL_TABLET | Freq: Every day | ORAL | Status: DC
  Administered 2014-03-01 – 2014-03-08 (×8): 100 mg
  Filled 2014-02-28 (×8): qty 1

## 2014-02-28 MED ORDER — SODIUM CHLORIDE 0.9 % IV SOLN
Freq: Once | INTRAVENOUS | Status: AC
  Administered 2014-02-28: 17:00:00 via INTRAVENOUS

## 2014-02-28 MED ORDER — ONDANSETRON HCL 4 MG PO TABS: 4.0000 mg | ORAL_TABLET | Freq: Four times a day (QID) | ORAL | Status: DC | PRN

## 2014-02-28 MED ORDER — VITAMIN B-1 100 MG PO TABS: 100.0000 mg | ORAL_TABLET | Freq: Every day | ORAL | Status: DC

## 2014-02-28 MED ORDER — SODIUM CHLORIDE 0.9 % IJ SOLN
3.0000 mL | Freq: Two times a day (BID) | INTRAMUSCULAR | Status: DC
  Administered 2014-03-01 – 2014-03-08 (×7): 3 mL via INTRAVENOUS

## 2014-02-28 MED ORDER — OSMOLITE 1.5 CAL PO LIQD: 1000.0000 mL | ORAL | Status: DC

## 2014-02-28 MED ORDER — SUCRALFATE 1 GM/10ML PO SUSP
1.0000 g | Freq: Three times a day (TID) | ORAL | Status: DC
  Administered 2014-02-28 – 2014-03-08 (×22): 1 g
  Filled 2014-02-28 (×25): qty 10

## 2014-02-28 MED ORDER — HYDROCODONE-ACETAMINOPHEN 7.5-325 MG/15ML PO SOLN: 15.0000 mL | Freq: Four times a day (QID) | ORAL | Status: DC | PRN

## 2014-02-28 MED ORDER — VANCOMYCIN HCL IN DEXTROSE 1-5 GM/200ML-% IV SOLN
1000.0000 mg | Freq: Once | INTRAVENOUS | Status: DC
Start: 2014-02-28 — End: 2014-02-28
  Administered 2014-02-28: 1000 mg via INTRAVENOUS
  Filled 2014-02-28: qty 200

## 2014-02-28 MED ORDER — FUROSEMIDE 10 MG/ML IJ SOLN
20.0000 mg | Freq: Once | INTRAMUSCULAR | Status: AC
  Administered 2014-03-01: 20 mg via INTRAVENOUS
  Filled 2014-02-28: qty 2

## 2014-02-28 MED ORDER — ONDANSETRON HCL 4 MG/2ML IJ SOLN
4.0000 mg | Freq: Four times a day (QID) | INTRAMUSCULAR | Status: DC | PRN
  Administered 2014-03-07: 4 mg via INTRAVENOUS
  Filled 2014-02-28: qty 2

## 2014-02-28 MED ORDER — SODIUM CHLORIDE 0.9 % IV BOLUS (SEPSIS)
500.0000 mL | Freq: Once | INTRAVENOUS | Status: AC
  Administered 2014-02-28: 500 mL via INTRAVENOUS

## 2014-02-28 MED ORDER — PIPERACILLIN-TAZOBACTAM 3.375 G IVPB 30 MIN
3.3750 g | INTRAVENOUS | Status: AC
  Administered 2014-02-28: 3.375 g via INTRAVENOUS
  Filled 2014-02-28: qty 50

## 2014-02-28 MED ORDER — ASPIRIN EC 81 MG PO TBEC
81.0000 mg | DELAYED_RELEASE_TABLET | Freq: Every morning | ORAL | Status: DC
  Administered 2014-03-01 – 2014-03-02 (×2): 81 mg via ORAL
  Filled 2014-02-28 (×2): qty 1

## 2014-02-28 MED ORDER — SORBITOL 70 % SOLN
30.0000 mL | Freq: Every day | Status: DC | PRN
  Administered 2014-02-28: 30 mL
  Filled 2014-02-28: qty 30

## 2014-02-28 NOTE — Progress Notes (Signed)
   Weekly Management Note:  outpatient    ICD-9-CM ICD-10-CM  1. 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  CBCT/MVCT images/Port film x-rays were reviewed.  The chart was checked. Pulse is thready, heart rate elevated. Legs trembling in wheelchair. Reports having blood clot removed from nose in ED last night.  Denies excessive bleeding. Mild odynophagia.He reports swallowing sips of water and receives all of this nutrition through his peg tube at night.   Physical Findings:  height is 5\' 6"  (1.676 m) and weight is 103 lb 14.4 oz (47.129 kg). His oral temperature is 98.1 F (36.7 C). His blood pressure is 112/67 and his pulse is 130. His respiration is 24 and oxygen saturation is 100%.  Legs trembling in wheelchair. Tachycardic. Thready pulse. Oropharynx clear. Neck hyperpigmented. Skin intact.  CBC    Component Value Date/Time   WBC 6.4 02/28/2014 1636   WBC 3.5* 02/22/2014 1016   RBC 2.49* 02/28/2014 1636   RBC 2.69* 02/22/2014 1016   RBC 3.79* 10/03/2009 0445   HGB 7.4* 02/28/2014 1636   HGB 8.1* 02/22/2014 1016   HCT 22.2* 02/28/2014 1636   HCT 24.5* 02/22/2014 1016   PLT 185 02/28/2014 1636   PLT 183 02/22/2014 1016   MCV 89.2 02/28/2014 1636   MCV 91.0 02/22/2014 1016   MCH 29.7 02/28/2014 1636   MCH 30.1 02/22/2014 1016   MCHC 33.3 02/28/2014 1636   MCHC 33.1 02/22/2014 1016   RDW 15.4 02/28/2014 1636   RDW 14.6 02/22/2014 1016   LYMPHSABS 0.3* 02/28/2014 1636   LYMPHSABS 0.6* 02/22/2014 1016   MONOABS 0.6 02/28/2014 1636   MONOABS 0.7 02/22/2014 1016   EOSABS 0.0 02/28/2014 1636   EOSABS 0.2 02/22/2014 1016   BASOSABS 0.0 02/28/2014 1636   BASOSABS 0.0 02/22/2014 1016     CMP     Component Value Date/Time   NA 134* 02/28/2014 1636   NA 136 02/22/2014 1016   K 4.4 02/28/2014 1636   K 4.6 02/22/2014 1016   CL 96 02/28/2014 1636   CO2 21 02/28/2014 1636   CO2 24  02/22/2014 1016   GLUCOSE 174* 02/28/2014 1636   GLUCOSE 133 02/22/2014 1016   BUN 27* 02/28/2014 1636   BUN 18.9 02/22/2014 1016   CREATININE 1.15 02/28/2014 1636   CREATININE 1.0 02/22/2014 1016   CALCIUM 9.1 02/28/2014 1636   CALCIUM 8.9 02/22/2014 1016   PROT 7.7 02/28/2014 1636   PROT 7.5 02/22/2014 1016   ALBUMIN 3.1* 02/28/2014 1636   ALBUMIN 3.0* 02/22/2014 1016   AST 22 02/28/2014 1636   AST 20 02/22/2014 1016   ALT 11 02/28/2014 1636   ALT 10 02/22/2014 1016   ALKPHOS 71 02/28/2014 1636   ALKPHOS 75 02/22/2014 1016   BILITOT 0.6 02/28/2014 1636   BILITOT 0.20 02/22/2014 1016   GFRNONAA 65* 02/28/2014 1636   GFRAA 76* 02/28/2014 1636     Impression:  The patient is tolerating radiotherapy.   Plan:  Continue radiotherapy as planned. Send to ED - concerned about heart rate, thready pulse.  Of note - he had been on carvedilol but this was held about a month ago related to hypotension, I believe.    -----------------------------------  Eppie Gibson, MD

## 2014-02-28 NOTE — Progress Notes (Signed)
[name] has completed 26/30 fractions to his upper esophagus.  He denies pain.  He reports swallowing sips of water and receives all of this nutrition through his peg tube at night. He continues to take hycet twice a day through his peg tube and also takes Carafate by mouth.  He denies pain with swallowing today.  He denies problems with his peg tube.  His site has dried, yellowing drainage around it.  He said a nurse clipped his sutures this morning.  He has lost 1 lb since last week.  He reports a dry mouth.  He said he went to the ER last night with a blood clot in his nose.  He is visible shaking today.  He said it started this morning.  Orthostatic vitals done: bp sitting 102/61, hr 119, bp standing 112/67, hr 130 and irregular.  He has a productive cough with clear/yellow sputum.  He is on 2l of oxygen via nasal canula.

## 2014-02-28 NOTE — ED Notes (Signed)
Bed: SX28 Expected date:  Expected time:  Means of arrival:  Comments: [id number] for triage 4

## 2014-02-28 NOTE — ED Notes (Signed)
2nd attempt to call report to floor, RN unavailable.

## 2014-02-28 NOTE — ED Notes (Signed)
Pt was given a urinal and is aware that we need a urine sample.

## 2014-02-28 NOTE — ED Provider Notes (Signed)
CSN: [id number]     Arrival date & time 02/28/14  1544 History   First MD Initiated Contact with Patient 02/28/14 [phone]     Chief Complaint  Patient presents with  . 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 He reports this morning he has been shaking and having some feeling of being cold. He has had a cough with white mucus sometimes yellow. He states he feels short of breath. He denies chest pain now but states last night after he got his evening medications which was after 10:30 PM he had chest pain that lasted several hours during the night. His pain was located in his left upper chest and he described it as burning. He has not had this before. He denies abdominal pain but states he did have some diarrhea this morning. He has had nausea and gagging without vomiting. He denies any abdominal pain. He describes weight loss. He was seen this afternoon by his oncologist and was noted to have a heart rate of 130. He was sent to the emergency department. Patient states he has a history of congestive heart failure has never had coronary artery disease that he is aware of.  PCP Dr Jeanie Cooks Cardiologist Dr Ron Parker Oncologist Dr Alen Blew  Past Medical History  Diagnosis Date  . Chronic pancreatitis     CT scan shows improving pancreatitis  . Nonischemic cardiomyopathy 09/2009    Catheterization, April, 2011, questionable occlusion of the most apical portion of the LAD, LV dysfunction out of proportion to coronary disease  . Chronic systolic CHF (congestive heart failure)     April, 2011 mild troponin elevation at that time  . Mitral regurgitation     mild, echo April 2011  . Tobacco abuse   . Alcohol use     heavy until Jan 2011, patient was counseled concerning alcohol in April 2011  . COPD (chronic obstructive pulmonary disease)   .  Fluid overload 03/2010    October, 2011  . Cough Jan 2012    may be from enalapril, January, 2012  . Ejection fraction < 50%     EF 35-40%, echo, April, 2011  . CAD (coronary artery disease)     Catheterization, April, 2011, questionable occlusion of the most apical portion of the LAD, LV dysfunction out of proportion to coronary disease  . Squamous cell esophageal cancer   . Severe protein-calorie malnutrition 01/31/2014   Past Surgical History  Procedure Laterality Date  . Cardiac catheterization  April 2011    questionable occlusion of the most apical portion of the LAD / LV dysfunction out of proportion to coronary disease  . Wrist surgey      left wrist  . Laparoscopic gastrostomy N/A 02/02/2014    Procedure: LAPAROSCOPIC GASTROSTOMY TUBE PLACEMENT;  Surgeon: Pedro Earls, MD;  Location: [facility];  Service: General;  Laterality: N/A;   Family History  Problem Relation Age of Onset  . Diabetes Cousin   . Cancer Mother    History  Substance Use Topics  . Smoking status: Former Smoker -- 0.50 packs/day for 30 years    Types: Cigarettes    Quit date: 01/19/2014  . Smokeless tobacco: Never Used     Comment: smoking 3 cigarettes per day  . Alcohol Use: No     Comment: heavy untill Jan 2011  oxygen 2 lpm [place] Moved into ALF 7/20  Review of  Systems  All other systems reviewed and are negative.     Allergies  Review of patient's allergies indicates no known allergies.  Home Medications   Prior to Admission medications   Medication Sig Start Date End Date Taking? Authorizing Provider  aspirin EC 81 MG tablet 81 mg by PEG Tube route every morning.     Historical Provider, MD  HYDROcodone-acetaminophen (HYCET) 7.5-325 mg/15 ml solution 15 mLs by PEG Tube route every 6 (six) hours as needed (FOR PAIN RELATED TO MALIGNANT NEOPLASM OF ESOPHAGUS).     Historical Provider, MD  lidocaine-prilocaine (EMLA) cream Apply 1 application topically as needed (port access.). 01/20/14   Wyatt Portela, MD  metoCLOPramide (REGLAN) 5 MG tablet 5 mg by PEG Tube route every 8 (eight) hours as needed for nausea.     Historical Provider, MD  Multiple Vitamin (MULTIVITAMIN WITH MINERALS) TABS tablet 1 tablet by PEG Tube route daily.     Historical Provider, MD  nicotine (NICODERM CQ - DOSED IN MG/24 HOURS) 21 mg/24hr patch Place 21 mg onto the skin daily.    Historical Provider, MD  nitroGLYCERIN (NITROSTAT) 0.4 MG SL tablet Place 0.4 mg under the tongue every 5 (five) minutes x 3 doses as needed for chest pain.  01/27/14   Carlena Bjornstad, MD  Nutritional Supplements (FEEDING SUPPLEMENT, OSMOLITE 1.5 CAL,) LIQD 1,000 mLs by PEG Tube route continuous. 115 ml/hr  For 10 hrs  (8pm-6am)    Historical Provider, MD  Omeprazole-Sodium Bicarbonate 20-1680 MG PACK 1 Package by PEG Tube route daily.    Historical Provider, MD  ondansetron (ZOFRAN-ODT) 4 MG disintegrating tablet 4 mg by PEG Tube route every 8 (eight) hours as needed for nausea or vomiting.     Historical Provider, MD  PRESCRIPTION MEDICATION 120 mLs by PEG Tube route 3 (three) times daily. Med plus    Historical Provider, MD  sucralfate (CARAFATE) 1 GM/10ML suspension 1 g by PEG Tube route 3 (three) times daily. FOR GASTRIC PROTECTION    Historical Provider, MD  terazosin (HYTRIN) 1 MG capsule 1 mg by PEG Tube route at bedtime.     Historical Provider, MD  thiamine (VITAMIN B-1) 100 MG tablet 100 mg by PEG Tube route daily.     Historical Provider, MD  Water For Irrigation, Sterile (STERILE WATER FOR IRRIGATION) Irrigate with 360 mLs as directed every 8 (eight) hours.    Historical Provider, MD   BP 119/64  Pulse 124  Temp(Src) 100.3 F (37.9 C) (Oral)  SpO2 96%  Vital signs normal except tachycardia and low-grade temp  Physical Exam  Nursing note and vitals reviewed. Constitutional: He is oriented to person, place, and time. He appears cachectic.  Non-toxic appearance. He does not appear ill. No distress.  HENT:  Head:  Normocephalic and atraumatic.  Right Ear: External ear normal.  Left Ear: External ear normal.  Nose: Nose normal. No mucosal edema or rhinorrhea.  Mouth/Throat: Oropharynx is clear and moist and mucous membranes are normal. No dental abscesses or uvula swelling.  Eyes: Conjunctivae and EOM are normal. Pupils are equal, round, and reactive to light.  Neck: Normal range of motion and full passive range of motion without pain. Neck supple.  Cardiovascular: Normal rate, regular rhythm and normal heart sounds.  Exam reveals no gallop and no friction rub.   No murmur heard. Pulmonary/Chest: Effort normal and breath sounds normal. No respiratory distress. He has no wheezes. He has no rhonchi. He has no rales. He exhibits  no tenderness and no crepitus.  Coarse breath sounds, coughing frequently  Abdominal: Soft. Normal appearance and bowel sounds are normal. He exhibits no distension. There is no tenderness. There is no rebound and no guarding.  Musculoskeletal: Normal range of motion. He exhibits no edema and no tenderness.  Moves all extremities well.   Neurological: He is alert and oriented to person, place, and time. He has normal strength. No cranial nerve deficit.  Skin: Skin is warm, dry and intact. No rash noted. No erythema. No pallor.  Psychiatric: He has a normal mood and affect. His speech is normal and behavior is normal. His mood appears not anxious.    ED Course  Procedures (including critical care time)  Medications  vancomycin (VANCOCIN) IVPB 1000 mg/200 mL premix (not administered)  feeding supplement (OSMOLITE 1.5 CAL) liquid 1,000 mL ( Per Tube New Bag/Given 02/28/14 2200)  sodium chloride 0.9 % bolus 500 mL (0 mLs Intravenous Stopped 02/28/14 1805)  0.9 %  sodium chloride infusion ( Intravenous New Bag/Given 02/28/14 1706)  piperacillin-tazobactam (ZOSYN) IVPB 3.375 g (0 g Intravenous Stopped 02/28/14 1900)   Pt given some IV fluids, but has hx of CHF.   Pt started on  antibiotics for possible HAP. Pt given results of his tests.   18:41 Dr Humphrey Rolls, admit to tele, team 8   Labs Review   Results for orders placed during the hospital encounter of 02/28/14  MRSA PCR SCREENING      Result Value Ref Range   MRSA by PCR NEGATIVE  NEGATIVE  CBC WITH DIFFERENTIAL      Result Value Ref Range   WBC 6.4  4.0 - 10.5 K/uL   RBC 2.49 (*) 4.22 - 5.81 MIL/uL   Hemoglobin 7.4 (*) 13.0 - 17.0 g/dL   HCT 22.2 (*) 39.0 - 52.0 %   MCV 89.2  78.0 - 100.0 fL   MCH 29.7  26.0 - 34.0 pg   MCHC 33.3  30.0 - 36.0 g/dL   RDW 15.4  11.5 - 15.5 %   Platelets 185  150 - 400 K/uL   Neutrophils Relative % 85 (*) 43 - 77 %   Lymphocytes Relative 5 (*) 12 - 46 %   Monocytes Relative 10  3 - 12 %   Eosinophils Relative 0  0 - 5 %   Basophils Relative 0  0 - 1 %   Neutro Abs 5.5  1.7 - 7.7 K/uL   Lymphs Abs 0.3 (*) 0.7 - 4.0 K/uL   Monocytes Absolute 0.6  0.1 - 1.0 K/uL   Eosinophils Absolute 0.0  0.0 - 0.7 K/uL   Basophils Absolute 0.0  0.0 - 0.1 K/uL   WBC Morphology INCREASED BANDS (>20% BANDS)    COMPREHENSIVE METABOLIC PANEL      Result Value Ref Range   Sodium 134 (*) 137 - 147 mEq/L   Potassium 4.4  3.7 - 5.3 mEq/L   Chloride 96  96 - 112 mEq/L   CO2 21  19 - 32 mEq/L   Glucose, Bld 174 (*) 70 - 99 mg/dL   BUN 27 (*) 6 - 23 mg/dL   Creatinine, Ser 1.15  0.50 - 1.35 mg/dL   Calcium 9.1  8.4 - 10.5 mg/dL   Total Protein 7.7  6.0 - 8.3 g/dL   Albumin 3.1 (*) 3.5 - 5.2 g/dL   AST 22  0 - 37 U/L   ALT 11  0 - 53 U/L   Alkaline Phosphatase 71  39 - 117 U/L   Total Bilirubin 0.6  0.3 - 1.2 mg/dL   GFR calc non Af Amer 65 (*) >90 mL/min   GFR calc Af Amer 76 (*) >90 mL/min   Anion gap 17 (*) 5 - 15  TROPONIN I      Result Value Ref Range   Troponin I <0.30  <0.30 ng/mL  PRO B NATRIURETIC PEPTIDE      Result Value Ref Range   Pro B Natriuretic peptide (BNP) 29729.0 (*) 0 - 125 pg/mL  I-STAT TROPOININ, ED      Result Value Ref Range   Troponin i, poc 0.40 (*) 0.00  - 0.08 ng/mL   Comment NOTIFIED PHYSICIAN     Comment 3            Laboratory interpretation all normal except slowly worsening anemia, chronically elevated BNP, normal WBC with increased bands, abnormal istat troponin with normal troponin   Results for orders placed during the hospital encounter of 02/27/14  CBC WITH DIFFERENTIAL      Result Value Ref Range   WBC 2.7 (*) 4.0 - 10.5 K/uL   RBC 2.54 (*) 4.22 - 5.81 MIL/uL   Hemoglobin 7.5 (*) 13.0 - 17.0 g/dL   HCT 22.8 (*) 39.0 - 52.0 %   MCV 89.8  78.0 - 100.0 fL   MCH 29.5  26.0 - 34.0 pg   MCHC 32.9  30.0 - 36.0 g/dL   RDW 15.4  11.5 - 15.5 %   Platelets 185  150 - 400 K/uL   Neutrophils Relative % 59  43 - 77 %   Lymphocytes Relative 20  12 - 46 %   Monocytes Relative 17 (*) 3 - 12 %   Eosinophils Relative 4  0 - 5 %   Basophils Relative 0  0 - 1 %   Neutro Abs 1.6 (*) 1.7 - 7.7 K/uL   Lymphs Abs 0.5 (*) 0.7 - 4.0 K/uL   Monocytes Absolute 0.5  0.1 - 1.0 K/uL   Eosinophils Absolute 0.1  0.0 - 0.7 K/uL   Basophils Absolute 0.0  0.0 - 0.1 K/uL   Smear Review MORPHOLOGY UNREMARKABLE    COMPREHENSIVE METABOLIC PANEL      Result Value Ref Range   Sodium 136 (*) 137 - 147 mEq/L   Potassium 4.8  3.7 - 5.3 mEq/L   Chloride 101  96 - 112 mEq/L   CO2 24  19 - 32 mEq/L   Glucose, Bld 127 (*) 70 - 99 mg/dL   BUN 25 (*) 6 - 23 mg/dL   Creatinine, Ser 1.01  0.50 - 1.35 mg/dL   Calcium 9.1  8.4 - 10.5 mg/dL   Total Protein 7.2  6.0 - 8.3 g/dL   Albumin 3.0 (*) 3.5 - 5.2 g/dL   AST 18  0 - 37 U/L   ALT 10  0 - 53 U/L   Alkaline Phosphatase 68  39 - 117 U/L   Total Bilirubin 0.4  0.3 - 1.2 mg/dL   GFR calc non Af Amer 77 (*) >90 mL/min   GFR calc Af Amer 89 (*) >90 mL/min   Anion gap 11  5 - 15        Imaging Review Dg Chest 2 View  02/28/2014   CLINICAL DATA:  Fever.  Shortness of breath.  Chest pain.  EXAM: CHEST  2 VIEW  COMPARISON:  01/31/2014  FINDINGS: The patient is rotated to the Right on today's radiograph,  reducing diagnostic sensitivity and specificity. Abnormal airspace opacities observed in the right mid lung and right lung base. Indistinct pulmonary vasculature with cardiopericardial silhouette enlarged compared to the prior exam.  Power injectable Port-A-Cath tip:  Lower SVC.  Emphysema noted.  No pleural effusion identified.  IMPRESSION: 1. Newly enlarged cardiopericardial silhouette with indistinct pulmonary vasculature favoring pulmonary venous hypertension/congestive heart failure. However, there is also confluent airspace opacity in the right lower lobe and potentially right middle lobe which could reflect asymmetric edema or pneumonia. 2. Emphysema.   Electronically Signed   By: Sherryl Barters M.D.   On: 02/28/2014 17:44    Portable Chest 1 View  01/31/2014   CLINICAL DATA:  Cough and chest congestion.   IMPRESSION: No acute abnormality.  Stable cardiomegaly.   Electronically Signed   By: Enrique Sack M.D.   On: 01/31/2014 18:57   Dg Abd 2 Views  02/04/2014   CLINICAL DATA:  Nausea and vomitingIMPRESSION: Diffuse colonic distention.  No free intraperitoneal gas.   Electronically Signed   By: Maryclare Bean M.D.   On: 02/04/2014 11:12    EKG Interpretation None        Date: 02/28/2014  Rate: 123  Rhythm: sinus tachycardia and premature ventricular contractions (PVC)  QRS Axis: right  Intervals: QT prolonged  ST/T Wave abnormalities: nonspecific T wave changes T waves have high amplitude and peaking but are inverted  Conduction Disutrbances:none  Narrative Interpretation:   Old EKG Reviewed: none available   MDM   Final diagnoses:  Healthcare-associated pneumonia  Anemia, unspecified  Other chest pain    Plan admission   Rolland Porter, MD, Alanson Aly, MD 02/28/14 [phone]

## 2014-02-28 NOTE — ED Notes (Signed)
Per Dr. Humphrey Rolls, ok for pt to continue receiving vanco at this time.

## 2014-02-28 NOTE — ED Notes (Signed)
Pt to CXR.

## 2014-02-28 NOTE — ED Notes (Signed)
HGB 6.7. Dr. Chancy Milroy made aware.

## 2014-02-28 NOTE — ED Notes (Addendum)
Patient sent over from Radiation oncology for irregular heart rate/tachycardia and shaking. Pt's heart rate at this time is in 120s. Alert andoriented. Hx of esophageal CA.

## 2014-02-28 NOTE — ED Notes (Signed)
Attempted to call report to floor, RN unavailable to take report

## 2014-02-28 NOTE — ED Notes (Signed)
Initial Contact - pt to RM18, resting on stretcher, shaking.  Pt reports feeling generally unwell, awoke with shaking this AM.  Pt reports has esophageal CA and is currently receiving radiating/chemo.  Pt denies pain.  A+Ox4.  Speaking full/clear sentences, rr even/un-lab.  Abd s/nt/nd.  Skin PWD.  Placed to cardiac/02 monitor.  NAD.

## 2014-02-28 NOTE — ED Notes (Signed)
hospitalist at bedside

## 2014-02-28 NOTE — Progress Notes (Signed)
ANTIBIOTIC CONSULT NOTE - INITIAL  Pharmacy Consult for Vancomycin and Zosyn Indication: pneumonia  No Known Allergies  Patient Measurements: Height: 5\' 6"  (167.6 cm) Weight: 103 lb (46.72 kg) IBW/kg (Calculated) : 63.8  Vital Signs: Temp: 98.7 F (37.1 C) (08/31 1825) Temp src: Oral (08/31 1825) BP: 113/93 mmHg (08/31 1900) Pulse Rate: 117 (08/31 1825) Intake/Output from previous day:   Intake/Output from this shift:    Labs:  Recent Labs  02/27/14 1620 02/28/14 1636 02/28/14 1918  WBC 2.7* 6.4 6.4  HGB 7.5* 7.4* 6.7*  PLT 185 185 167  CREATININE 1.01 1.15  --    Estimated Creatinine Clearance: 42.9 ml/min (by C-G formula based on Cr of 1.15). No results found for this basename: Letta Median, VANCORANDOM, GENTTROUGH, GENTPEAK, GENTRANDOM, TOBRATROUGH, TOBRAPEAK, TOBRARND, AMIKACINPEAK, AMIKACINTROU, AMIKACIN,  in the last 72 hours   Microbiology: Recent Results (from the past 720 hour(s))  URINE CULTURE     Status: None   Collection Time    02/01/14  2:00 AM      Result Value Ref Range Status   Specimen Description URINE, RANDOM   Final   Special Requests NONE   Final   Culture  Setup Time     Final   Value: 02/01/2014 11:10     Performed at [facility]     Final   Value: 4,000 COLONIES/ML     Performed at [facility]   Culture     Final   Value: INSIGNIFICANT GROWTH     Performed at [facility]   Report Status 02/02/2014 FINAL   Final  SURGICAL PCR SCREEN     Status: Abnormal   Collection Time    02/02/14  8:50 AM      Result Value Ref Range Status   MRSA, PCR INVALID RESULTS, SPECIMEN SENT FOR CULTURE (*) NEGATIVE Final   Comment: NOTIFIED C. OBERRY RN AT 1105 ON 08.05.15 BY SHUEA   Staphylococcus aureus INVALID RESULTS, SPECIMEN SENT FOR CULTURE (*) NEGATIVE Final   Comment: NOTIFIED C. OBERRY RN AT 8338 ON 08.05.15 BY SHUEA                The Xpert SA Assay (FDA     approved for NASAL specimens      in patients over 20 years of age),     is one component of     a comprehensive surveillance     program.  Test performance has     been validated by [employer] for patients greater     than or equal to 80 year old.     It is not intended     to diagnose infection nor to     guide or monitor treatment.  MRSA CULTURE     Status: None   Collection Time    02/02/14  8:50 AM      Result Value Ref Range Status   Specimen Description NOSE   Final   Special Requests NONE   Final   Culture     Final   Value: NO STAPHYLOCOCCUS AUREUS ISOLATED     Note: NOMRSA     Performed at [facility]   Report Status 02/04/2014 FINAL   Final    Medical History: Past Medical History  Diagnosis Date  . Chronic pancreatitis     CT scan shows improving pancreatitis  . Nonischemic cardiomyopathy 09/2009    Catheterization, April, 2011,  questionable occlusion of the most apical portion of the LAD, LV dysfunction out of proportion to coronary disease  . Chronic systolic CHF (congestive heart failure)     April, 2011 mild troponin elevation at that time  . Mitral regurgitation     mild, echo April 2011  . Tobacco abuse   . Alcohol use     heavy until Jan 2011, patient was counseled concerning alcohol in April 2011  . COPD (chronic obstructive pulmonary disease)   . Fluid overload 03/2010    October, 2011  . Cough Jan 2012    may be from enalapril, January, 2012  . Ejection fraction < 50%     EF 35-40%, echo, April, 2011  . CAD (coronary artery disease)     Catheterization, April, 2011, questionable occlusion of the most apical portion of the LAD, LV dysfunction out of proportion to coronary disease  . Squamous cell esophageal cancer   . Severe protein-calorie malnutrition 01/31/2014    Medications:  Scheduled:  . furosemide  20 mg Intravenous Once   Infusions:  . sodium chloride     PRN:   Assessment: 63 yo male with esophageal cancer on chemotherapy and radiation who  p[facility] with weak pulse. CXR shows possible PNA. Patient was recently admitted 8/3-8/10 for dehydration. Pharmacy consulted to dose vancomycin and zosyn for PNA in immunocompromised patient.  8/31 >> Vancomycin >> 8/31 >> Zosyn >>   Tmax: Afebrile WBCs: 6.4k Renal: SCr 1.15, CrCl 42 ml/min (CG), 65 ml/min (N)  8/31 blood x2: sent  Goal of Therapy:  Vancomycin trough level 15-20 mcg/ml Zosyn dose per renal function  Plan:   Vancomycin 1g IV q24h Check trough at steady state Zosyn 3.375gm IV q8h (4hr extended infusions) Follow up renal function & cultures  Peggyann Juba, PharmD, BCPS Pager: [phone] 02/28/2014,7:53 PM

## 2014-02-28 NOTE — H&P (Signed)
[facility] History and Physical  MILBERN DOESCHER DJM:[id number] DOB: 1951-02-20 DOA: 02/28/2014  Referring physician: Rolland Porter, MD PCP: Philis Fendt, MD   Chief Complaint: Pneumonia  HPI: Michael Keith is a 63 y.o. male with history of esophageal cancer presented to his oncologist for a check up noted to have weak pulse and was sent here. Patient states on questioning that he has not been feeling well for some time. He states that he has had chills and has felt shortness of breath. He has had a cough for probably a month now. He brings up yellow sputum. Patient has been a smoker also and has COPD. He was sent to the ED for further evaluation. He was noted to have on CXR increased infiltrates suggesting a pneumonia. Patient also had an appearance of CHF and could not be entirely ruled out. He states that he had some pain in his chest maybe yesterday currently he states he is pain free. Patient has no swelling has no fevers noted. He has been quite tired however. In the ED he had a POC troponin of 0.4 and his blood troponin was 0.3 which was discussed with the cardiologist on call and felt to be OK to admit to the floor with telemetry.   Review of Systems:  Constitutional:  No weight loss, night sweats, Fevers, ++chills, ++fatigue.  HEENT:  No headaches, Difficulty swallowing,Tooth/dental problems nasal congestion,  Cardio-vascular:  ++chest pain now pain free since yesterday, no Orthopnea, PND, swelling in lower extremities palpitations  GI:  No heartburn, indigestion, abdominal pain, nausea, vomiting, diarrhea  Resp:  ++shortness of breath with exertion. No excess mucus, ++ productive cough, No coughing up of blood++wheezing  Skin:  no rash or lesions.  GU:  no dysuria, change in color of urine, no urgency or frequency.  Musculoskeletal:  No joint pain or swelling. No decreased range of motion.  Psych:  No change in mood or affect. No depression or anxiety.  Past Medical  History  Diagnosis Date  . Chronic pancreatitis     CT scan shows improving pancreatitis  . Nonischemic cardiomyopathy 09/2009    Catheterization, April, 2011, questionable occlusion of the most apical portion of the LAD, LV dysfunction out of proportion to coronary disease  . Chronic systolic CHF (congestive heart failure)     April, 2011 mild troponin elevation at that time  . Mitral regurgitation     mild, echo April 2011  . Tobacco abuse   . Alcohol use     heavy until Jan 2011, patient was counseled concerning alcohol in April 2011  . COPD (chronic obstructive pulmonary disease)   . Fluid overload 03/2010    October, 2011  . Cough Jan 2012    may be from enalapril, January, 2012  . Ejection fraction < 50%     EF 35-40%, echo, April, 2011  . CAD (coronary artery disease)     Catheterization, April, 2011, questionable occlusion of the most apical portion of the LAD, LV dysfunction out of proportion to coronary disease  . Squamous cell esophageal cancer   . Severe protein-calorie malnutrition 01/31/2014   Past Surgical History  Procedure Laterality Date  . Cardiac catheterization  April 2011    questionable occlusion of the most apical portion of the LAD / LV dysfunction out of proportion to coronary disease  . Wrist surgey      left wrist  . Laparoscopic gastrostomy N/A 02/02/2014    Procedure: LAPAROSCOPIC GASTROSTOMY TUBE PLACEMENT;  Surgeon:  Pedro Earls, MD;  Location: [facility];  Service: General;  Laterality: N/A;   Social History:  reports that he quit smoking about 5 weeks ago. His smoking use included Cigarettes. He has a 15 pack-year smoking history. He has never used smokeless tobacco. He reports that he does not drink alcohol or use illicit drugs.  No Known Allergies  Family History  Problem Relation Age of Onset  . Diabetes Cousin   . Cancer Mother      Prior to Admission medications   Medication Sig Start Date End Date Taking? Authorizing Provider  aspirin  EC 81 MG tablet 81 mg by PEG Tube route every morning.    Yes Historical Provider, MD  HYDROcodone-acetaminophen (HYCET) 7.5-325 mg/15 ml solution 15 mLs by PEG Tube route every 6 (six) hours as needed (FOR PAIN RELATED TO MALIGNANT NEOPLASM OF ESOPHAGUS).    Yes Historical Provider, MD  lidocaine-prilocaine (EMLA) cream Apply 1 application topically as needed (port access.). 01/20/14  Yes Wyatt Portela, MD  Multiple Vitamin (MULTIVITAMIN WITH MINERALS) TABS tablet 1 tablet by PEG Tube route daily.    Yes Historical Provider, MD  nicotine (NICODERM CQ - DOSED IN MG/24 HOURS) 21 mg/24hr patch Place 21 mg onto the skin daily.   Yes Historical Provider, MD  nitroGLYCERIN (NITROSTAT) 0.4 MG SL tablet Place 0.4 mg under the tongue every 5 (five) minutes x 3 doses as needed for chest pain.  01/27/14  Yes Carlena Bjornstad, MD  Nutritional Supplements (FEEDING SUPPLEMENT, OSMOLITE 1.5 CAL,) LIQD 1,000 mLs by PEG Tube route continuous. 115 ml/hr  For 10 hrs  (8pm-6am)   Yes Historical Provider, MD  sucralfate (CARAFATE) 1 GM/10ML suspension 1 g by PEG Tube route 3 (three) times daily. FOR GASTRIC PROTECTION   Yes Historical Provider, MD  terazosin (HYTRIN) 1 MG capsule 1 mg by PEG Tube route at bedtime.    Yes Historical Provider, MD  thiamine (VITAMIN B-1) 100 MG tablet 100 mg by PEG Tube route daily.    Yes Historical Provider, MD  Water For Irrigation, Sterile (STERILE WATER FOR IRRIGATION) Irrigate with 360 mLs as directed every 8 (eight) hours.   Yes Historical Provider, MD  ondansetron (ZOFRAN-ODT) 4 MG disintegrating tablet 4 mg by PEG Tube route every 8 (eight) hours as needed for nausea or vomiting.     Historical Provider, MD  PRESCRIPTION MEDICATION 120 mLs by PEG Tube route 3 (three) times daily. Med plus    Historical Provider, MD   Physical Exam: Filed Vitals:   02/28/14 1715 02/28/14 1825 02/28/14 1830 02/28/14 1845  BP: 109/46 95/53 92/51  95/55  Pulse:  117    Temp:  98.7 F (37.1 C)      TempSrc:  Oral    Resp: 27 20 29 27   SpO2:  96%      Wt Readings from Last 3 Encounters:  02/28/14 47.129 kg (103 lb 14.4 oz)  02/21/14 47.219 kg (104 lb 1.6 oz)  02/15/14 49.47 kg (109 lb 1 oz)    General:  Appears calm and comfortable Eyes: PERRL, normal lids, irises & conjunctiva ENT: grossly normal hearing, lips & tongue Neck: no LAD, masses or thyromegaly Cardiovascular: RR, no m/r/g. No LE edema. Telemetry: tachycardia to 113  Respiratory:  Normal respiratory effort. ++crackle noted  Abdomen: soft, ntnd Skin: no rash or induration seen on limited exam Musculoskeletal: grossly normal tone BUE/BLE Psychiatric: grossly normal mood and affect, speech fluent and appropriate Neurologic: grossly non-focal.  Labs on Admission:  Basic Metabolic Panel:  Recent Labs Lab 02/22/14 1016 02/27/14 1620 02/28/14 1636  NA 136 136* 134*  K 4.6 4.8 4.4  CL  --  101 96  CO2 24 24 21   GLUCOSE 133 127* 174*  BUN 18.9 25* 27*  CREATININE 1.0 1.01 1.15  CALCIUM 8.9 9.1 9.1   Liver Function Tests:  Recent Labs Lab 02/22/14 1016 02/27/14 1620 02/28/14 1636  AST 20 18 22   ALT 10 10 11   ALKPHOS 75 68 71  BILITOT 0.20 0.4 0.6  PROT 7.5 7.2 7.7  ALBUMIN 3.0* 3.0* 3.1*   No results found for this basename: LIPASE, AMYLASE,  in the last 168 hours No results found for this basename: AMMONIA,  in the last 168 hours CBC:  Recent Labs Lab 02/22/14 1016 02/27/14 1620 02/28/14 1636  WBC 3.5* 2.7* 6.4  NEUTROABS 2.1 1.6* 5.5  HGB 8.1* 7.5* 7.4*  HCT 24.5* 22.8* 22.2*  MCV 91.0 89.8 89.2  PLT 183 185 185   Cardiac Enzymes:  Recent Labs Lab 02/28/14 1636  TROPONINI <0.30    BNP (last 3 results)  Recent Labs  02/28/14 1636  PROBNP 29729.0*   CBG: No results found for this basename: GLUCAP,  in the last 168 hours  Radiological Exams on Admission: Dg Chest 2 View  02/28/2014   CLINICAL DATA:  Fever.  Shortness of breath.  Chest pain.  EXAM: CHEST  2  VIEW  COMPARISON:  01/31/2014  FINDINGS: The patient is rotated to the Right on today's radiograph, reducing diagnostic sensitivity and specificity. Abnormal airspace opacities observed in the right mid lung and right lung base. Indistinct pulmonary vasculature with cardiopericardial silhouette enlarged compared to the prior exam.  Power injectable Port-A-Cath tip:  Lower SVC.  Emphysema noted.  No pleural effusion identified.  IMPRESSION: 1. Newly enlarged cardiopericardial silhouette with indistinct pulmonary vasculature favoring pulmonary venous hypertension/congestive heart failure. However, there is also confluent airspace opacity in the right lower lobe and potentially right middle lobe which could reflect asymmetric edema or pneumonia. 2. Emphysema.   Electronically Signed   By: Sherryl Barters M.D.   On: 02/28/2014 17:44    EKG: Independently reviewed. Sinus Tachy  Assessment/Plan Principal Problem:   Healthcare-associated pneumonia Active Problems:   COPD (chronic obstructive pulmonary disease)   Chronic systolic CHF (congestive heart failure)   Cancer of upper third of esophagus   Dehydration   Pneumonia   1. HCAP -will start on vancocin and zosyn for possible HCAP -monitor repeat CXR in the morning -will continue with oxygen therapy  2. COPD -will continue oxygen therapy -inhalers will be started  3. CHF -will monitor fluid status closely -repeat CXR in the morning -currently appears more dehydrated than overloaded -will hold off on lasix  4. Dehydration -gentle hydration -monitor for fluid overload  5. Tachycardia -likely as a result of pneumonia and dehydration -will place on telemetry  6. Cancer of Esophagus -concern for aspiration -will monitor  7. Troponin positive -discussed with cardiology -OK to admit to telemetry -patient is pain free and they will see in morning  8. Anemia -will get iron studies -type and screen ordered   Code Status: Full  Code (must indicate code status--if unknown or must be presumed, indicate so) DVT Prophylaxis:heparin Family Communication: None (indicate person spoken with, if applicable, with phone number if by telephone) Disposition Plan: SNF (indicate anticipated LOS)  Time spent: 67min  Stellah Donovan A [facility] Pager [phone]  **Disclaimer: This note  may have been dictated with voice recognition software. Similar sounding words can inadvertently be transcribed and this note may contain transcription errors which may not have been corrected upon publication of note.**

## 2014-02-28 NOTE — ED Notes (Signed)
When pt. Lays on left side, BP drops to 70s/40s. When placed on back, pressure comes back up to 100s/50s

## 2014-02-28 NOTE — Telephone Encounter (Signed)
Phoned Marzetta Board, triage RN for [facility]. Explained this patient is in route to her from [facility] following radiation treatment to his esophagus. Explained Dr. Isidore Moos is concerned the patient has a thready pulse and she is concerned for an arrhythmia. Also, informed her of hx of hypotension, weight loss and poor nutrition. Advised patient is no longer taking Coreg due to hypotension. She verbalized understanding.

## 2014-02-28 NOTE — ED Notes (Signed)
Patient is aware that a urine sample is needed, urinal is at the bedside.  

## 2014-03-01 ENCOUNTER — Encounter: Payer: Self-pay | Admitting: *Deleted

## 2014-03-01 ENCOUNTER — Other Ambulatory Visit: Payer: Self-pay

## 2014-03-01 ENCOUNTER — Ambulatory Visit: Payer: Medicare Other

## 2014-03-01 ENCOUNTER — Ambulatory Visit: Payer: Self-pay

## 2014-03-01 ENCOUNTER — Inpatient Hospital Stay (HOSPITAL_COMMUNITY): Payer: Medicare Other

## 2014-03-01 ENCOUNTER — Telehealth: Payer: Self-pay | Admitting: *Deleted

## 2014-03-01 DIAGNOSIS — I369 Nonrheumatic tricuspid valve disorder, unspecified: Secondary | ICD-10-CM

## 2014-03-01 DIAGNOSIS — I509 Heart failure, unspecified: Secondary | ICD-10-CM

## 2014-03-01 DIAGNOSIS — J411 Mucopurulent chronic bronchitis: Secondary | ICD-10-CM

## 2014-03-01 DIAGNOSIS — J9600 Acute respiratory failure, unspecified whether with hypoxia or hypercapnia: Secondary | ICD-10-CM

## 2014-03-01 DIAGNOSIS — D649 Anemia, unspecified: Secondary | ICD-10-CM

## 2014-03-01 DIAGNOSIS — E43 Unspecified severe protein-calorie malnutrition: Secondary | ICD-10-CM

## 2014-03-01 DIAGNOSIS — R059 Cough, unspecified: Secondary | ICD-10-CM

## 2014-03-01 DIAGNOSIS — J441 Chronic obstructive pulmonary disease with (acute) exacerbation: Secondary | ICD-10-CM

## 2014-03-01 DIAGNOSIS — J189 Pneumonia, unspecified organism: Secondary | ICD-10-CM

## 2014-03-01 DIAGNOSIS — R05 Cough: Secondary | ICD-10-CM

## 2014-03-01 DIAGNOSIS — R627 Adult failure to thrive: Secondary | ICD-10-CM

## 2014-03-01 LAB — TROPONIN I
TROPONIN I: 0.64 ng/mL — AB (ref <0.30)
Troponin I: 0.38 ng/mL (ref <0.30)

## 2014-03-01 LAB — GLUCOSE, CAPILLARY: Glucose-Capillary: 175 mg/dL — ABNORMAL HIGH (ref 70–99)

## 2014-03-01 LAB — CBC
HCT: 30.8 % — ABNORMAL LOW (ref 39.0–52.0)
Hemoglobin: 10.3 g/dL — ABNORMAL LOW (ref 13.0–17.0)
MCH: 28.9 pg (ref 26.0–34.0)
MCHC: 33.4 g/dL (ref 30.0–36.0)
MCV: 86.5 fL (ref 78.0–100.0)
PLATELETS: 159 10*3/uL (ref 150–400)
RBC: 3.56 MIL/uL — ABNORMAL LOW (ref 4.22–5.81)
RDW: 15.6 % — AB (ref 11.5–15.5)
WBC: 7.7 10*3/uL (ref 4.0–10.5)

## 2014-03-01 LAB — COMPREHENSIVE METABOLIC PANEL
ALT: 34 U/L (ref 0–53)
ANION GAP: 14 (ref 5–15)
AST: 65 U/L — ABNORMAL HIGH (ref 0–37)
Albumin: 2.6 g/dL — ABNORMAL LOW (ref 3.5–5.2)
Alkaline Phosphatase: 109 U/L (ref 39–117)
BILIRUBIN TOTAL: 1 mg/dL (ref 0.3–1.2)
BUN: 27 mg/dL — AB (ref 6–23)
CALCIUM: 8.3 mg/dL — AB (ref 8.4–10.5)
CO2: 21 meq/L (ref 19–32)
CREATININE: 1.17 mg/dL (ref 0.50–1.35)
Chloride: 100 mEq/L (ref 96–112)
GFR, EST AFRICAN AMERICAN: 74 mL/min — AB (ref >90)
GFR, EST NON AFRICAN AMERICAN: 64 mL/min — AB (ref >90)
Glucose, Bld: 154 mg/dL — ABNORMAL HIGH (ref 70–99)
Potassium: 4.2 mEq/L (ref 3.7–5.3)
Sodium: 135 mEq/L — ABNORMAL LOW (ref 137–147)
Total Protein: 7.3 g/dL (ref 6.0–8.3)

## 2014-03-01 LAB — HEMOGLOBIN A1C
HEMOGLOBIN A1C: 6.8 % — AB (ref <5.7)
MEAN PLASMA GLUCOSE: 148 mg/dL — AB (ref <117)

## 2014-03-01 LAB — IRON AND TIBC
Iron: <10 ug/dL — ABNORMAL LOW (ref 42–135)
UIBC: 180 ug/dL (ref 125–400)

## 2014-03-01 LAB — PROCALCITONIN: PROCALCITONIN: 53.44 ng/mL

## 2014-03-01 MED ORDER — LORAZEPAM 2 MG/ML IJ SOLN
0.5000 mg | Freq: Once | INTRAMUSCULAR | Status: AC
  Administered 2014-03-01: 0.5 mg via INTRAVENOUS
  Filled 2014-03-01: qty 1

## 2014-03-01 MED ORDER — LEVALBUTEROL HCL 1.25 MG/0.5ML IN NEBU
1.2500 mg | INHALATION_SOLUTION | Freq: Four times a day (QID) | RESPIRATORY_TRACT | Status: DC
  Administered 2014-03-01 – 2014-03-04 (×12): 1.25 mg via RESPIRATORY_TRACT
  Filled 2014-03-01 (×11): qty 0.5

## 2014-03-01 MED ORDER — IPRATROPIUM BROMIDE 0.02 % IN SOLN
0.5000 mg | Freq: Four times a day (QID) | RESPIRATORY_TRACT | Status: DC
  Administered 2014-03-01 – 2014-03-04 (×12): 0.5 mg via RESPIRATORY_TRACT
  Filled 2014-03-01 (×12): qty 2.5

## 2014-03-01 MED ORDER — METOPROLOL TARTRATE 1 MG/ML IV SOLN
INTRAVENOUS | Status: AC
  Filled 2014-03-01: qty 5

## 2014-03-01 MED ORDER — FUROSEMIDE 10 MG/ML IJ SOLN
20.0000 mg | Freq: Two times a day (BID) | INTRAMUSCULAR | Status: DC
  Administered 2014-03-02: 20 mg via INTRAVENOUS
  Filled 2014-03-01: qty 2

## 2014-03-01 MED ORDER — FUROSEMIDE 10 MG/ML IJ SOLN
20.0000 mg | Freq: Once | INTRAMUSCULAR | Status: AC
  Administered 2014-03-01: 20 mg via INTRAVENOUS
  Filled 2014-03-01: qty 2

## 2014-03-01 MED ORDER — SODIUM CHLORIDE 0.9 % IV BOLUS (SEPSIS)
250.0000 mL | Freq: Once | INTRAVENOUS | Status: AC
  Administered 2014-03-01: 250 mL via INTRAVENOUS

## 2014-03-01 MED ORDER — METHYLPREDNISOLONE SODIUM SUCC 40 MG IJ SOLR
40.0000 mg | Freq: Four times a day (QID) | INTRAMUSCULAR | Status: DC
  Administered 2014-03-01 – 2014-03-04 (×14): 40 mg via INTRAVENOUS
  Filled 2014-03-01 (×14): qty 1

## 2014-03-01 MED ORDER — BUDESONIDE 0.25 MG/2ML IN SUSP
0.2500 mg | Freq: Two times a day (BID) | RESPIRATORY_TRACT | Status: DC
  Administered 2014-03-01 – 2014-03-08 (×15): 0.25 mg via RESPIRATORY_TRACT
  Filled 2014-03-01 (×24): qty 2

## 2014-03-01 MED ORDER — LEVALBUTEROL HCL 1.25 MG/0.5ML IN NEBU: 1.2500 mg | INHALATION_SOLUTION | Freq: Four times a day (QID) | RESPIRATORY_TRACT | Status: DC

## 2014-03-01 MED ORDER — METOPROLOL TARTRATE 1 MG/ML IV SOLN
5.0000 mg | Freq: Once | INTRAVENOUS | Status: AC
  Administered 2014-03-01: 5 mg via INTRAVENOUS

## 2014-03-01 NOTE — Progress Notes (Signed)
Ruthell Rummage, NP with [facility] called regarding Lasix order. 20mg  of Lasix ordered inbetween pts 2 units of PRBC. After first unit was done, pts blood pressure ws 91/61. NP said to hold Lasix until after the second unit of blood was transfused and call her after to update. Pt is stable. Will continue to monitor.

## 2014-03-01 NOTE — Progress Notes (Signed)
Notified NP Minor about patient's increase in frequency of PVCs and strip reading rhythm of Vent Trigeminy. No orders placed or interventions requested. Will continue to monitor.

## 2014-03-01 NOTE — Progress Notes (Signed)
At 2215, 15 minutes into the first unit of blood being transfused oral temp changed from 99.7 oral to 102.4 oral.  Blood immediately stopped.  No other changes from previous assessment observed.  Pt asymptomatic.  Lamar Blinks, NP notified regarding questionable blood reaction.  She called back at 2225, after discussing situation, she advised not to treat it as a blood reaction.  Tylenol 325mg  given per G-tube.  Isaiah Blakes

## 2014-03-01 NOTE — Progress Notes (Signed)
INITIAL NUTRITION ASSESSMENT  Pt meets criteria for severe MALNUTRITION in the context of chronic illness as evidenced by severe wasting in upper arms, hands, and temporal region.  DOCUMENTATION CODES Per approved criteria  -Severe malnutrition in the context of chronic illness -Underweight   INTERVENTION: - Recommend continue with home TF regimen via PEG (which has already been ordered) of Osmolite 1.5 at 118ml/hr x 10 hours - Recommend MD initiate adult enteral protocol - RD to continue to monitor   NUTRITION DIAGNOSIS: Inadequate oral intake related to inability to eat as evidenced by NPO.   Goal: TF to meet >90% of estimated nutritional needs  Monitor:  Weights, labs, TF tolerance  Reason for Assessment: Malnutrition screening tool, home TF, consult for assessment   63 y.o. male  Admitting Dx: Healthcare-associated pneumonia  ASSESSMENT: Pt with history of esophageal cancer, s/p PEG placement earlier this month, presented to his oncologist for a check up noted to have weak pulse and was sent here. Patient states on questioning that he has not been feeling well for some time. He states that he has had chills and has felt shortness of breath. He has had a cough for probably a month now. He brings up yellow sputum. Patient has been a smoker also and has COPD. He was sent to the ED for further evaluation. He was noted to have on CXR increased infiltrates suggesting a pneumonia.   - Currently on venturi mask - Pt in/out of sleep during visit - Said he was not eating by mouth, only getting TF - Per home medication list, pt was getting Osmolite 1.5 at 115/hr x 10 hours via PEG PTA  - This provided 1725 calories, 72g protein, and 837ml free water  - Pt with visible severe wasting in upper arms, hands, and temporal region with moderate wasting in clavicles   Height: Ht Readings from Last 1 Encounters:  02/28/14 5\' 6"  (1.676 m)    Weight: Wt Readings from Last 1 Encounters:   02/28/14 104 lb 11.5 oz (47.5 kg)    Ideal Body Weight: 142 lbs   % Ideal Body Weight: 73%  Wt Readings from Last 10 Encounters:  02/28/14 104 lb 11.5 oz (47.5 kg)  02/28/14 103 lb 14.4 oz (47.129 kg)  02/21/14 104 lb 1.6 oz (47.219 kg)  02/15/14 109 lb 1 oz (49.47 kg)  02/07/14 110 lb 14.4 oz (50.304 kg)  02/07/14 110 lb 14.4 oz (50.304 kg)  01/31/14 101 lb 3.2 oz (45.904 kg)  01/27/14 106 lb (48.081 kg)  01/26/14 109 lb (49.442 kg)  01/24/14 104 lb 9.6 oz (47.446 kg)    Usual Body Weight: 110 lbs   % Usual Body Weight: 95%  BMI:  Body mass index is 16.91 kg/(m^2). Underweight  Estimated Nutritional Needs: Kcal: 1700-1900 Protein: 70-90g Fluid: 1.7-1.9L/day   Skin: intact   Diet Order: NPO  EDUCATION NEEDS: -No education needs identified at this time   Intake/Output Summary (Last 24 hours) at 03/01/14 1236 Last data filed at 03/01/14 1200  Gross per 24 hour  Intake 1652.5 ml  Output    720 ml  Net  932.5 ml    Last BM: 8/30  Labs:   Recent Labs Lab 02/27/14 1620 02/28/14 1636 02/28/14 1918 03/01/14 0630  NA 136* 134*  --  135*  K 4.8 4.4  --  4.2  CL 101 96  --  100  CO2 24 21  --  21  BUN 25* 27*  --  27*  CREATININE 1.01 1.15 1.17 1.17  CALCIUM 9.1 9.1  --  8.3*  GLUCOSE 127* 174*  --  154*    CBG (last 3)   Recent Labs  03/01/14 0807  GLUCAP 175*    Scheduled Meds: . sodium chloride   Intravenous Once  . aspirin EC  81 mg Oral q morning - 10a  . budesonide (PULMICORT) nebulizer solution  0.25 mg Nebulization BID  . feeding supplement (OSMOLITE 1.5 CAL)  1,000 mL Per Tube Q24H  . folic acid  1 mg Per Tube Daily  . furosemide  20 mg Intravenous Q12H  . heparin  5,000 Units Subcutaneous 3 times per day  . ipratropium  0.5 mg Nebulization Q6H  . levalbuterol  1.25 mg Nebulization Q6H  . methylPREDNISolone (SOLU-MEDROL) injection  40 mg Intravenous Q6H  . multivitamin with minerals  1 tablet Per Tube Daily  . nicotine  21 mg  Transdermal Daily  . piperacillin-tazobactam (ZOSYN)  IV  3.375 g Intravenous Q8H  . sodium chloride  3 mL Intravenous Q12H  . sterile water for irrigation  360 mL Irrigation 3 times per day  . sucralfate  1 g Per Tube TID  . terazosin  1 mg Per Tube QHS  . thiamine  100 mg Per Tube Daily  . vancomycin  1,000 mg Intravenous Q24H    Continuous Infusions:   Past Medical History  Diagnosis Date  . Chronic pancreatitis     CT scan shows improving pancreatitis  . Nonischemic cardiomyopathy 09/2009    Catheterization, April, 2011, questionable occlusion of the most apical portion of the LAD, LV dysfunction out of proportion to coronary disease  . Chronic systolic CHF (congestive heart failure)     April, 2011 mild troponin elevation at that time  . Mitral regurgitation     mild, echo April 2011  . Tobacco abuse   . Alcohol use     heavy until Jan 2011, patient was counseled concerning alcohol in April 2011  . COPD (chronic obstructive pulmonary disease)   . Fluid overload 03/2010    October, 2011  . Cough Jan 2012    may be from enalapril, January, 2012  . Ejection fraction < 50%     EF 35-40%, echo, April, 2011  . CAD (coronary artery disease)     Catheterization, April, 2011, questionable occlusion of the most apical portion of the LAD, LV dysfunction out of proportion to coronary disease  . Squamous cell esophageal cancer   . Severe protein-calorie malnutrition 01/31/2014    Past Surgical History  Procedure Laterality Date  . Cardiac catheterization  April 2011    questionable occlusion of the most apical portion of the LAD / LV dysfunction out of proportion to coronary disease  . Wrist surgey      left wrist  . Laparoscopic gastrostomy N/A 02/02/2014    Procedure: LAPAROSCOPIC GASTROSTOMY TUBE PLACEMENT;  Surgeon: Pedro Earls, MD;  Location: [facility];  Service: General;  Laterality: N/A;    Carlis Stable MS, [place], LDN [phone] Pager [phone] Weekend/After Hours Pager

## 2014-03-01 NOTE — Consult Note (Signed)
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 He has had 26/30 RTX treatments and om 8/31 was sent from RTX oncology to [facility] ED due to tachycardic, hypotension and FTT. Chest Xray was suggestive of Rt pna(suspect aspiration) and e continued to need increased FIO2. He was given PRBC for hgb of 6.7 but had a decline in blood pressure. He has been given lasix and ativan(suspected ETOH abuse) and PCCM was asked to evaluate for tachypnea, tachycardia and worsening mental status.  PAST MEDICAL HISTORY :  Past Medical History  Diagnosis Date  . Chronic pancreatitis     CT scan shows improving pancreatitis  . Nonischemic cardiomyopathy 09/2009    Catheterization, April, 2011, questionable occlusion of the most apical portion of the LAD, LV dysfunction out of proportion to coronary disease  . Chronic systolic CHF (congestive heart failure)     April, 2011 mild troponin elevation at that time  . Mitral regurgitation     mild, echo April 2011  . Tobacco abuse   . Alcohol use     heavy until Jan 2011, patient was counseled concerning alcohol in April 2011  . COPD (chronic obstructive pulmonary disease)   . Fluid overload 03/2010    October, 2011  . Cough Jan 2012    may be from enalapril, January, 2012  . Ejection fraction < 50%     EF 35-40%, echo, April, 2011  . CAD (coronary artery disease)     Catheterization, April, 2011, questionable occlusion of the most apical portion of the LAD, LV dysfunction out of proportion to coronary disease  . Squamous cell esophageal cancer   . Severe protein-calorie malnutrition 01/31/2014   Past Surgical  History  Procedure Laterality Date  . Cardiac catheterization  April 2011    questionable occlusion of the most apical portion of the LAD / LV dysfunction out of proportion to coronary disease  . Wrist surgey      left wrist  . Laparoscopic gastrostomy N/A 02/02/2014    Procedure: LAPAROSCOPIC GASTROSTOMY TUBE PLACEMENT;  Surgeon: Pedro Earls, MD;  Location: [facility];  Service: General;  Laterality: N/A;   Prior to Admission medications   Medication Sig Start Date End Date Taking? Authorizing Provider  aspirin EC 81 MG tablet 81 mg by PEG Tube route every morning.    Yes Historical Provider, MD  HYDROcodone-acetaminophen (HYCET) 7.5-325 mg/15 ml solution 15 mLs by PEG Tube route every 6 (six) hours as needed (FOR PAIN RELATED TO MALIGNANT NEOPLASM OF ESOPHAGUS).    Yes Historical Provider, MD  lidocaine-prilocaine (EMLA) cream Apply 1 application topically as needed (port access.). 01/20/14  Yes Wyatt Portela, MD  metoCLOPramide (REGLAN) 5 MG tablet Take 5 mg by mouth every 8 (eight) hours as needed for nausea.   Yes Historical Provider, MD  Multiple Vitamin (MULTIVITAMIN WITH MINERALS) TABS tablet 1 tablet by PEG Tube route daily.    Yes Historical Provider, MD  nicotine (NICODERM CQ - DOSED IN MG/24 HOURS) 21 mg/24hr patch Place 21 mg onto the skin daily.   Yes Historical Provider, MD  nitroGLYCERIN (NITROSTAT) 0.4  MG SL tablet Place 0.4 mg under the tongue every 5 (five) minutes x 3 doses as needed for chest pain.  01/27/14  Yes Carlena Bjornstad, MD  Nutritional Supplements (FEEDING SUPPLEMENT, OSMOLITE 1.5 CAL,) LIQD 1,000 mLs by PEG Tube route continuous. 115 ml/hr  For 10 hrs  (8pm-6am)   Yes Historical Provider, MD  omeprazole-sodium bicarbonate (ZEGERID) 40-1100 MG per capsule Take 1 capsule by mouth See admin instructions. Via Peg-tube once daily   Yes Historical Provider, MD  PRESCRIPTION MEDICATION 120 mLs by PEG Tube route 3 (three) times daily. Med plus   Yes Historical Provider, MD   sucralfate (CARAFATE) 1 GM/10ML suspension 1 g by PEG Tube route 3 (three) times daily. FOR GASTRIC PROTECTION   Yes Historical Provider, MD  terazosin (HYTRIN) 1 MG capsule 1 mg by PEG Tube route at bedtime.    Yes Historical Provider, MD  thiamine (VITAMIN B-1) 100 MG tablet 100 mg by PEG Tube route daily.    Yes Historical Provider, MD  TUBERCULIN PPD ID Inject 1 each into the skin See admin instructions. Every 10 days. Two step PPD.   Yes Historical Provider, MD  Water For Irrigation, Sterile (STERILE WATER FOR IRRIGATION) Irrigate with 360 mLs as directed every 8 (eight) hours.   Yes Historical Provider, MD  ondansetron (ZOFRAN-ODT) 4 MG disintegrating tablet 4 mg by PEG Tube route every 8 (eight) hours as needed for nausea or vomiting.     Historical Provider, MD   No Known Allergies  FAMILY HISTORY:  Family History  Problem Relation Age of Onset  . Diabetes Cousin   . Cancer Mother    SOCIAL HISTORY:  reports that he quit smoking about 5 weeks ago. His smoking use included Cigarettes. He has a 15 pack-year smoking history. He has never used smokeless tobacco. He reports that he does not drink alcohol or use illicit drugs.  REVIEW OF SYSTEMS:  NA  SUBJECTIVE:   VITAL SIGNS: Temp:  [97.8 F (36.6 C)-102.6 F (39.2 C)] 98.9 F (37.2 C) (09/01 0408) Pulse Rate:  [29-176] 29 (09/01 0536) Resp:  [20-32] 25 (09/01 0536) BP: (65-121)/(43-93) 108/60 mmHg (09/01 0536) SpO2:  [86 %-100 %] 98 % (09/01 0826) FiO2 (%):  [50 %] 50 % (09/01 0826) Weight:  [103 lb (46.72 kg)-104 lb 11.5 oz (47.5 kg)] 104 lb 11.5 oz (47.5 kg) (08/31 2103) HEMODYNAMICS:   VENTILATOR SETTINGS: Vent Mode:  [-]  FiO2 (%):  [50 %] 50 % INTAKE / OUTPUT:  Intake/Output Summary (Last 24 hours) at 03/01/14 1125 Last data filed at 03/01/14 0600  Gross per 24 hour  Intake 1492.5 ml  Output    350 ml  Net 1142.5 ml    PHYSICAL EXAMINATION: General: Frail, thin , lethargic AAM Neuro:  Arouses but has dull  affect HEENT:  No JVD/LAN Cardiovascular:  HSR RRR Tachycardia Lungs:  Coarse rhonchi Abdomen: PEG Musculoskeletal:  intact Skin:  warm  LABS:  CBC  Recent Labs Lab 02/28/14 1636 02/28/14 1918 03/01/14 0630  WBC 6.4 6.4 7.7  HGB 7.4* 6.7* 10.3*  HCT 22.2* 19.7* 30.8*  PLT 185 167 159   Coag's No results found for this basename: APTT, INR,  in the last 168 hours BMET  Recent Labs Lab 02/27/14 1620 02/28/14 1636 02/28/14 1918 03/01/14 0630  NA 136* 134*  --  135*  K 4.8 4.4  --  4.2  CL 101 96  --  100  CO2 24 21  --  21  BUN 25* 27*  --  27*  CREATININE 1.01 1.15 1.17 1.17  GLUCOSE 127* 174*  --  154*   Electrolytes  Recent Labs Lab 02/27/14 1620 02/28/14 1636 03/01/14 0630  CALCIUM 9.1 9.1 8.3*   Sepsis Markers  Recent Labs Lab 02/28/14 2023  LATICACIDVEN 1.8   ABG No results found for this basename: PHART, PCO2ART, PO2ART,  in the last 168 hours Liver Enzymes  Recent Labs Lab 02/27/14 1620 02/28/14 1636 03/01/14 0630  AST 18 22 65*  ALT 10 11 34  ALKPHOS 68 71 109  BILITOT 0.4 0.6 1.0  ALBUMIN 3.0* 3.1* 2.6*   Cardiac Enzymes  Recent Labs Lab 02/28/14 1636 02/28/14 1918 03/01/14 0630  TROPONINI <0.30 0.55* 0.38*  PROBNP 29729.0*  --   --    Glucose  Recent Labs Lab 03/01/14 0807  GLUCAP 175*    Imaging Dg Chest 2 View  02/28/2014   CLINICAL DATA:  Fever.  Shortness of breath.  Chest pain.  EXAM: CHEST  2 VIEW  COMPARISON:  01/31/2014  FINDINGS: The patient is rotated to the Right on today's radiograph, reducing diagnostic sensitivity and specificity. Abnormal airspace opacities observed in the right mid lung and right lung base. Indistinct pulmonary vasculature with cardiopericardial silhouette enlarged compared to the prior exam.  Power injectable Port-A-Cath tip:  Lower SVC.  Emphysema noted.  No pleural effusion identified.  IMPRESSION: 1. Newly enlarged cardiopericardial silhouette with indistinct pulmonary vasculature  favoring pulmonary venous hypertension/congestive heart failure. However, there is also confluent airspace opacity in the right lower lobe and potentially right middle lobe which could reflect asymmetric edema or pneumonia. 2. Emphysema.   Electronically Signed   By: Sherryl Barters M.D.   On: 02/28/2014 17:44     ASSESSMENT / PLAN:  PULMONARY OETT A: Hypoxic resp failure with presumed pna in  esophogeal cancer pt (?aspiration),  COPD with recent tobacco cessation ? Hypercarbia(lethargic) AECOPD  P:   O2 as needed May need intubation BD's Abx On steroids BD's  CARDIOVASCULAR CVL Porta cath A:  Tachycardia Hypotension  CAD NICM CHF ? Early sepsis   P:  May need pressor support  Check BNP Check pro calcitonin Abx  Consider 2 d echo ? Cards consult  RENAL A:   No acute issue P:     GASTROINTESTINAL A:    PEG tube P:   Per IM TF as tolerated  HEMATOLOGIC A:   Anemia of chronic dz Esophogeal cancer P:  Follow cbc and tx per protocol Hemonc input  INFECTIOUS A:  Presumed Pna  In immunocompromised pt P:   BCx2 8/31>> UC  8/31>> Sputum Abx: Vanc, start date 8/31, day 1/x  Abx zoysn , start date 8/31, day 1/x   ENDOCRINE A:   Hyperglycemia on steroids  P:   SSI  NEUROLOGIC A:   Lethargic but arouable P:   RASS goal: 1 Rule out hypercarbia  TODAY'S SUMMARY:  63 yo former smoker, dx with squamous cell carcinoma 6/15, he has had 26 /30 rtx treatments and  was sent from [facility] oncology to [facility] ED 8/31 for elevated heart rate, increased wob, presumed pna and FTT. He ws admitted by [facility] and has continued to to have increasing wob and hypoxia and PCCM asked to evaluate.  I have personally obtained a history, examined the patient, evaluated laboratory and imaging results, formulated the assessment and plan and placed orders. CRITICAL CARE: The patient is critically ill with multiple organ systems failure and requires high complexity  decision  making for assessment and support, frequent evaluation and titration of therapies, application of advanced monitoring technologies and extensive interpretation of multiple databases. Critical Care Time devoted to patient care services described in this note is --- minutes.    Pulmonary and [place] Pager: [phone]  03/01/2014, 11:25 AM

## 2014-03-01 NOTE — Telephone Encounter (Signed)
Spoke with Loma Sousa at [place], cancelled Mr. Moede's appts for today.  [name], RN, BSN, [place] at [place] [phone]

## 2014-03-01 NOTE — Progress Notes (Signed)
NP Michael Keith Minor on unit and notified about patient's new troponin results of 0.64 and Procalcitonin results. No new orders or requested interventions. Will continue to monitor.

## 2014-03-01 NOTE — Plan of Care (Signed)
Problem: Phase I Progression Outcomes Goal: Pain controlled with appropriate interventions Outcome: Completed/Met Date Met:  03/01/14 Denies any pain Goal: Voiding-avoid urinary catheter unless indicated Outcome: Completed/Met Date Met:  03/01/14 Using urinal Goal: Hemodynamically stable Outcome: Progressing Pressure at times low in the 15Q systolic

## 2014-03-01 NOTE — Progress Notes (Signed)
To provide support, encouragement and care continuity, visited pt in [place].    [name], RN, BSN, [place] at [place] [phone]

## 2014-03-01 NOTE — Consult Note (Signed)
Patient seen and examined with NP S. Minor.  Lab, images, and vitals reviewed. Agree with NP S. Minor assessment and plan with the following exceptions:  1. Dyspnea\Respiratory distress - AECOPD with possible hypercarbia - check ABG - patient awake and mentating well on NRB, will cont with this for now - bipap would be optimal for this patient, but given his poor cough and possible aspiration, it is not the best choice at this time.  Can re-evaluate - spirometry and chest physiotherapy - if no improvement may need intubation - spoke with patient stated that his cousin, [name] [phone]), is HCPOA.      Initial Consult Time = 7mins  Vilinda Boehringer, MD [place] [facility] Pager [phone] On Call Pager [phone]

## 2014-03-01 NOTE — Progress Notes (Signed)
Patient's current BP soft and SBP in low 80's. Paged MD Dyann Kief and was advised to continue to monitor and to notify if MAP becomes less then 60. Also approved order to place external catheter.

## 2014-03-01 NOTE — Progress Notes (Signed)
  Echocardiogram 2D Echocardiogram has been performed.  Donata Clay 03/01/2014, 3:33 PM

## 2014-03-01 NOTE — Progress Notes (Signed)
Called respiratory about patient's acute status. Was advised was on 4th floor and will be there as soon as able.

## 2014-03-01 NOTE — Progress Notes (Addendum)
[facility] PROGRESS NOTE  Michael Keith LKT:[id number] DOB: July 12, 1950 DOA: 02/28/2014 PCP: Philis Fendt, MD  Assessment/Plan: 1-acute resp failure with hypoxia: multifactorial. Secondary to HCAP, COPD exacerbation and CHF exacerbation.  -remains in stepdown unit -continue IV lasix -continue solumedrol, pulmicort and PRN nebulizer -patient is on vanc and zosyn -continue oxygen supplementation -PCCM consulted for assistance (patient might required to be intubated due to continuous WOB)  2-sepsis microorganism unspecified: most likely due to HCAP and bacteremia -follow cx's sensitivity -continue supportive care and broad spectrum antibiotics -PCCM consulted for assistance in management as patient might need pressors   3-elevated troponin: appears to be demand ischemia in nature. -will check 2-D echo -patient no complaining of CP -will continue monitoring on telemetry -cardiology will see him and will follow rec's  4-acute on chronic CHF exacerbation -daily weight -strict intake and output -IV lasix -daily BMET -follow 2-D echo  5-severe protein calorie malnutrition: follow dietician rec's -continue feeding supplements  6-esophagus cancer -to continue follow up and treatment by oncology service  Code Status: Full Family Communication: no family at bedside Disposition Plan: to be determine   Consultants:  Cardiology  PCCM  Procedures:  See below for x-ray  2-D echo: pending   Antibiotics:  vanc and zosyn   HPI/Subjective: resp distress and difficulty speaking in full sentences; mild use of accessory muscles and tachypnea.  Objective: Filed Vitals:   03/01/14 0536  BP: 108/60  Pulse: 29  Temp:   Resp: 25    Intake/Output Summary (Last 24 hours) at 03/01/14 0819 Last data filed at 03/01/14 0600  Gross per 24 hour  Intake 1492.5 ml  Output    350 ml  Net 1142.5 ml   Filed Weights   02/28/14 1943 02/28/14 2103  Weight: 46.72 kg (103 lb)  47.5 kg (104 lb 11.5 oz)    Exam:   General:  Acute resp distress; tachypnea, audible wheezing and mild use of accessories muscles   Cardiovascular: tachycardic, S1 and S2, no rubs or gallops; mild JVD  Respiratory: exp wheezing; positive crackles bibasilar  Abdomen: soft, NT, ND, positive BS  Musculoskeletal: no edema, no cyanosis or clubbing   Data Reviewed: Basic Metabolic Panel:  Recent Labs Lab 02/22/14 1016 02/27/14 1620 02/28/14 1636 02/28/14 1918 03/01/14 0630  NA 136 136* 134*  --  135*  K 4.6 4.8 4.4  --  4.2  CL  --  101 96  --  100  CO2 24 24 21   --  21  GLUCOSE 133 127* 174*  --  154*  BUN 18.9 25* 27*  --  27*  CREATININE 1.0 1.01 1.15 1.17 1.17  CALCIUM 8.9 9.1 9.1  --  8.3*   Liver Function Tests:  Recent Labs Lab 02/22/14 1016 02/27/14 1620 02/28/14 1636 03/01/14 0630  AST 20 18 22  65*  ALT 10 10 11  34  ALKPHOS 75 68 71 109  BILITOT 0.20 0.4 0.6 1.0  PROT 7.5 7.2 7.7 7.3  ALBUMIN 3.0* 3.0* 3.1* 2.6*   CBC:  Recent Labs Lab 02/22/14 1016 02/27/14 1620 02/28/14 1636 02/28/14 1918 03/01/14 0630  WBC 3.5* 2.7* 6.4 6.4 7.7  NEUTROABS 2.1 1.6* 5.5  --   --   HGB 8.1* 7.5* 7.4* 6.7* 10.3*  HCT 24.5* 22.8* 22.2* 19.7* 30.8*  MCV 91.0 89.8 89.2 88.7 86.5  PLT 183 185 185 167 159   Cardiac Enzymes:  Recent Labs Lab 02/28/14 1636 02/28/14 1918 03/01/14 0630  TROPONINI <0.30 0.55* 0.38*  BNP (last 3 results)  Recent Labs  02/28/14 1636  PROBNP 29729.0*   CBG:  Recent Labs Lab 03/01/14 0807  GLUCAP 175*    Recent Results (from the past 240 hour(s))  CULTURE, BLOOD (ROUTINE X 2)     Status: None   Collection Time    02/28/14  4:36 PM      Result Value Ref Range Status   Specimen Description BLOOD RIGHT HAND   Final   Special Requests BOTTLES DRAWN AEROBIC AND ANAEROBIC 3 ML   Final   Culture  Setup Time     Final   Value: 02/28/2014 18:52     Performed at [facility]   Culture     Final   Value: GRAM  POSITIVE COCCI IN CLUSTERS     Note: Gram Stain Report Called to,Read Back By and Verified With:  LISA FREY @[id number]  03/01/14 SMIAS     Performed at [facility]   Report Status PENDING   Incomplete  CULTURE, BLOOD (ROUTINE X 2)     Status: None   Collection Time    02/28/14  4:36 PM      Result Value Ref Range Status   Specimen Description BLOOD RIGHT PORT   Final   Special Requests BOTTLES DRAWN AEROBIC AND ANAEROBIC 5 ML   Final   Culture  Setup Time     Final   Value: 02/28/2014 18:53     Performed at [facility]   Culture     Final   Value: GRAM POSITIVE COCCI IN CLUSTERS     Note: Gram Stain Report Called to,Read Back By and Verified With:  Clydene Fake [phone] 03/01/14 SMIAS     Performed at [facility]   Report Status PENDING   Incomplete  MRSA PCR SCREENING     Status: None   Collection Time    02/28/14  9:33 PM      Result Value Ref Range Status   MRSA by PCR NEGATIVE  NEGATIVE Final   Comment:            The GeneXpert MRSA Assay (FDA     approved for NASAL specimens     only), is one component of a     comprehensive MRSA colonization     surveillance program. It is not     intended to diagnose MRSA     infection nor to guide or     monitor treatment for     MRSA infections.     Studies: Dg Chest 2 View  02/28/2014   CLINICAL DATA:  Fever.  Shortness of breath.  Chest pain.  EXAM: CHEST  2 VIEW  COMPARISON:  01/31/2014  FINDINGS: The patient is rotated to the Right on today's radiograph, reducing diagnostic sensitivity and specificity. Abnormal airspace opacities observed in the right mid lung and right lung base. Indistinct pulmonary vasculature with cardiopericardial silhouette enlarged compared to the prior exam.  Power injectable Port-A-Cath tip:  Lower SVC.  Emphysema noted.  No pleural effusion identified.  IMPRESSION: 1. Newly enlarged cardiopericardial silhouette with indistinct pulmonary vasculature favoring pulmonary venous  hypertension/congestive heart failure. However, there is also confluent airspace opacity in the right lower lobe and potentially right middle lobe which could reflect asymmetric edema or pneumonia. 2. Emphysema.   Electronically Signed   By: Sherryl Barters M.D.   On: 02/28/2014 17:44   Dg Chest Port 1 View  03/01/2014   CLINICAL DATA:  Pneumonia.  EXAM:  PORTABLE CHEST - 1 VIEW  COMPARISON:  PA and lateral chest 02/28/2014 and 01/31/2014. PET CT scan 12/21/2013.  FINDINGS: Port-A-Cath remains in place. The lungs are emphysematous. Airspace opacity in the right mid lung zone persists but appears mildly improved. Cardiomegaly and pulmonary vascular congestion are again seen. PEG tube is noted.  IMPRESSION: Airspace opacity right mid lung zone likely due to pneumonia appears mildly improved.  Cardiomegaly and vascular congestion.  Emphysema.   Electronically Signed   By: Inge Rise M.D.   On: 03/01/2014 07:27    Scheduled Meds: . sodium chloride   Intravenous Once  . aspirin EC  81 mg Oral q morning - 10a  . budesonide (PULMICORT) nebulizer solution  0.25 mg Nebulization BID  . feeding supplement (OSMOLITE 1.5 CAL)  1,000 mL Per Tube Q24H  . folic acid  1 mg Per Tube Daily  . heparin  5,000 Units Subcutaneous 3 times per day  . ipratropium  0.5 mg Nebulization Q6H  . levalbuterol  1.25 mg Nebulization Q6H  . LORazepam  0.5 mg Intravenous Once  . methylPREDNISolone (SOLU-MEDROL) injection  40 mg Intravenous Q6H  . multivitamin with minerals  1 tablet Per Tube Daily  . nicotine  21 mg Transdermal Daily  . piperacillin-tazobactam (ZOSYN)  IV  3.375 g Intravenous Q8H  . sodium chloride  3 mL Intravenous Q12H  . sterile water for irrigation  360 mL Irrigation 3 times per day  . sucralfate  1 g Per Tube TID  . terazosin  1 mg Per Tube QHS  . thiamine  100 mg Per Tube Daily  . vancomycin  1,000 mg Intravenous Q24H    Principal Problem:   Healthcare-associated pneumonia Active Problems:    COPD (chronic obstructive pulmonary disease)   Chronic systolic CHF (congestive heart failure)   Cancer of upper third of esophagus   Dehydration   Pneumonia   HCAP (healthcare-associated pneumonia)    Time spent: >30 minutes    Barton Dubois  [facility] Pager [phone]. If 7PM-7AM, please contact night-coverage at [email or web address], password [facility] 03/01/2014, 8:19 AM  LOS: 1 day

## 2014-03-01 NOTE — Progress Notes (Signed)
CARE MANAGEMENT NOTE 03/01/2014  Patient:  Michael Keith, Michael Keith   Account Number:  [id number]  Date Initiated:  03/01/2014  Documentation initiated by:  DAVIS,RHONDA  Subjective/Objective Assessment:   pt with anemia and hgb 6.4 on admit, cxr-pna,hx of multiple health problems     Action/Plan:   snf   Anticipated DC Date:  03/04/2014   Anticipated DC Plan:  SKILLED NURSING FACILITY  In-house referral  Clinical Social Worker      DC Planning Services  CM consult      [facility] Choice  NA   Choice offered to / List presented to:  NA   DME arranged  NA      DME agency  NA     [place] arranged  NA      [place] agency  NA   Status of service:  In process, will continue to follow Medicare Important Message given?  NA - LOS <3 / Initial given by admissions (If response is "NO", the following Medicare IM given date fields will be blank) Date Medicare IM given:   Medicare IM given by:   Date Additional Medicare IM given:   Additional Medicare IM given by:    Discharge Disposition:    Per UR Regulation:  Reviewed for med. necessity/level of care/duration of stay  If discussed at [place] of Stay Meetings, dates discussed:    Comments:  Suanne Marker Davis,RN,BSN,CCM

## 2014-03-01 NOTE — Progress Notes (Signed)
Around 0145, Pts heart rate increased into the 170s. Pt was alert and oriented. He did not complain of any chest pain, shortness of breath or dizziness. Pts blood pressure was 89/66 with a MAP of 71. Pts blood transfusion was paused and Michael Rummage, NP with [facility] paged. Order for EKG was given and done. EKG was read by Michael Keith and the order for 5mg  of Metoprolol was ordered and given by RN. Within 10 minutes of administration, pts heart rate came down to the 90s. Another EKG was done and Michael Keith paged to update on effectiveness of med. Michael Keith said to continue with the blood transfusion as she does not see that the ST was from it. No new orders given. Pt is stable. Will continue to montior.

## 2014-03-01 NOTE — Progress Notes (Signed)
Text-paged MD Dyann Kief to ensure was aware patient's troponin at 06:30 was 0.38.

## 2014-03-02 ENCOUNTER — Encounter: Payer: Self-pay | Admitting: *Deleted

## 2014-03-02 ENCOUNTER — Ambulatory Visit
Admission: RE | Admit: 2014-03-02 | Discharge: 2014-03-02 | Disposition: A | Discharge status: Home / Self Care | Payer: Medicare Other | Source: Ambulatory Visit | Attending: Radiation Oncology | Admitting: Radiation Oncology

## 2014-03-02 ENCOUNTER — Ambulatory Visit: Payer: Medicare Other

## 2014-03-02 ENCOUNTER — Telehealth: Payer: Self-pay | Admitting: *Deleted

## 2014-03-02 ENCOUNTER — Inpatient Hospital Stay (HOSPITAL_COMMUNITY): Payer: Medicare Other

## 2014-03-02 DIAGNOSIS — J9620 Acute and chronic respiratory failure, unspecified whether with hypoxia or hypercapnia: Secondary | ICD-10-CM

## 2014-03-02 DIAGNOSIS — E860 Dehydration: Secondary | ICD-10-CM

## 2014-03-02 DIAGNOSIS — R7989 Other specified abnormal findings of blood chemistry: Secondary | ICD-10-CM

## 2014-03-02 DIAGNOSIS — C153 Malignant neoplasm of upper third of esophagus: Secondary | ICD-10-CM

## 2014-03-02 DIAGNOSIS — I5022 Chronic systolic (congestive) heart failure: Secondary | ICD-10-CM

## 2014-03-02 DIAGNOSIS — J690 Pneumonitis due to inhalation of food and vomit: Secondary | ICD-10-CM

## 2014-03-02 DIAGNOSIS — I428 Other cardiomyopathies: Secondary | ICD-10-CM

## 2014-03-02 DIAGNOSIS — J438 Other emphysema: Secondary | ICD-10-CM

## 2014-03-02 DIAGNOSIS — R0902 Hypoxemia: Secondary | ICD-10-CM

## 2014-03-02 LAB — BASIC METABOLIC PANEL
ANION GAP: 15 (ref 5–15)
BUN: 36 mg/dL — ABNORMAL HIGH (ref 6–23)
CALCIUM: 8.4 mg/dL (ref 8.4–10.5)
CO2: 21 meq/L (ref 19–32)
Chloride: 99 mEq/L (ref 96–112)
Creatinine, Ser: 1.41 mg/dL — ABNORMAL HIGH (ref 0.50–1.35)
GFR calc Af Amer: 59 mL/min — ABNORMAL LOW (ref >90)
GFR, EST NON AFRICAN AMERICAN: 51 mL/min — AB (ref >90)
Glucose, Bld: 466 mg/dL — ABNORMAL HIGH (ref 70–99)
Potassium: 3.9 mEq/L (ref 3.7–5.3)
Sodium: 135 mEq/L — ABNORMAL LOW (ref 137–147)

## 2014-03-02 LAB — GLUCOSE, CAPILLARY
GLUCOSE-CAPILLARY: 241 mg/dL — AB (ref 70–99)
Glucose-Capillary: 446 mg/dL — ABNORMAL HIGH (ref 70–99)
Glucose-Capillary: 198 mg/dL — ABNORMAL HIGH (ref 70–99)
Glucose-Capillary: 143 mg/dL — ABNORMAL HIGH (ref 70–99)
Glucose-Capillary: 252 mg/dL — ABNORMAL HIGH (ref 70–99)

## 2014-03-02 LAB — TYPE AND SCREEN
ABO/RH(D): A POS
ANTIBODY SCREEN: NEGATIVE
UNIT DIVISION: 0
Unit division: 0

## 2014-03-02 LAB — PROCALCITONIN: Procalcitonin: 52.46 ng/mL

## 2014-03-02 LAB — TROPONIN I: Troponin I: <0.3 ng/mL (ref <0.30)

## 2014-03-02 MED ORDER — INSULIN ASPART 100 UNIT/ML ~~LOC~~ SOLN
12.0000 [IU] | Freq: Once | SUBCUTANEOUS | Status: AC
  Administered 2014-03-02: 12 [IU] via SUBCUTANEOUS

## 2014-03-02 MED ORDER — INSULIN ASPART 100 UNIT/ML ~~LOC~~ SOLN
0.0000 [IU] | SUBCUTANEOUS | Status: DC
  Administered 2014-03-02: 5 [IU] via SUBCUTANEOUS
  Administered 2014-03-02: 1 [IU] via SUBCUTANEOUS
  Administered 2014-03-02: 3 [IU] via SUBCUTANEOUS
  Administered 2014-03-03 (×2): 7 [IU] via SUBCUTANEOUS
  Administered 2014-03-03: 3 [IU] via SUBCUTANEOUS

## 2014-03-02 NOTE — Progress Notes (Signed)
Patient refuses chest vest at this time. RT will continue to monitor.

## 2014-03-02 NOTE — Consult Note (Signed)
Cosign done, see note from 03/01/14  Vilinda Boehringer, MD [place] [facility] Pager [phone] On Call Pager [phone]

## 2014-03-02 NOTE — Telephone Encounter (Signed)
Spoke with Michael Keith at [facility], cancelled Mr. Emley's appts for today.  [name], RN, BSN, [place] at [place] [phone]

## 2014-03-02 NOTE — Consult Note (Signed)
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 He has had 26/30 RTX treatments and om 8/31 was sent from RTX oncology to [facility] ED due to tachycardic, hypotension and FTT. Chest Xray was suggestive of Rt pna(suspect aspiration) and e continued to need increased FIO2. He was given PRBC for hgb of 6.7 but had a decline in blood pressure. He has been given lasix and ativan(suspected ETOH abuse) and PCCM was asked to evaluate for tachypnea, tachycardia and worsening mental status.  PAST MEDICAL HISTORY :  Past Medical History  Diagnosis Date  . Chronic pancreatitis     CT scan shows improving pancreatitis  . Nonischemic cardiomyopathy 09/2009    Catheterization, April, 2011, questionable occlusion of the most apical portion of the LAD, LV dysfunction out of proportion to coronary disease  . Chronic systolic CHF (congestive heart failure)     April, 2011 mild troponin elevation at that time  . Mitral regurgitation     mild, echo April 2011  . Tobacco abuse   . Alcohol use     heavy until Jan 2011, patient was counseled concerning alcohol in April 2011  . COPD (chronic obstructive pulmonary disease)   . Fluid overload 03/2010    October, 2011  . Cough Jan 2012    may be from enalapril, January, 2012  . Ejection fraction < 50%     EF 35-40%, echo, April, 2011  . CAD (coronary artery disease)     Catheterization, April, 2011, questionable occlusion of the most apical portion of the LAD, LV dysfunction out of proportion to coronary disease  . Squamous cell  esophageal cancer   . Severe protein-calorie malnutrition 01/31/2014   Past Surgical History  Procedure Laterality Date  . Cardiac catheterization  April 2011    questionable occlusion of the most apical portion of the LAD / LV dysfunction out of proportion to coronary disease  . Wrist surgey      left wrist  . Laparoscopic gastrostomy N/A 02/02/2014    Procedure: LAPAROSCOPIC GASTROSTOMY TUBE PLACEMENT;  Surgeon: Pedro Earls, MD;  Location: [facility] ORS;  Service: General;  Laterality: N/A;   Prior to Admission medications   Medication Sig Start Date End Date Taking? Authorizing Provider  aspirin EC 81 MG tablet 81 mg by PEG Tube route every morning.    Yes Historical Provider, MD  HYDROcodone-acetaminophen (HYCET) 7.5-325 mg/15 ml solution 15 mLs by PEG Tube route every 6 (six) hours as needed (FOR PAIN RELATED TO MALIGNANT NEOPLASM OF ESOPHAGUS).    Yes Historical Provider, MD  lidocaine-prilocaine (EMLA) cream Apply 1 application topically as needed (port access.). 01/20/14  Yes Wyatt Portela, MD  metoCLOPramide (REGLAN) 5 MG tablet Take 5 mg by mouth every 8 (eight) hours as needed for nausea.   Yes Historical Provider, MD  Multiple Vitamin (MULTIVITAMIN WITH MINERALS) TABS tablet 1 tablet by PEG Tube route daily.    Yes Historical Provider, MD  nicotine (NICODERM CQ - DOSED IN MG/24 HOURS) 21 mg/24hr patch Place 21 mg onto the  skin daily.   Yes Historical Provider, MD  nitroGLYCERIN (NITROSTAT) 0.4 MG SL tablet Place 0.4 mg under the tongue every 5 (five) minutes x 3 doses as needed for chest pain.  01/27/14  Yes Carlena Bjornstad, MD  Nutritional Supplements (FEEDING SUPPLEMENT, OSMOLITE 1.5 CAL,) LIQD 1,000 mLs by PEG Tube route continuous. 115 ml/hr  For 10 hrs  (8pm-6am)   Yes Historical Provider, MD  omeprazole-sodium bicarbonate (ZEGERID) 40-1100 MG per capsule Take 1 capsule by mouth See admin instructions. Via Peg-tube once daily   Yes Historical Provider, MD  PRESCRIPTION MEDICATION 120  mLs by PEG Tube route 3 (three) times daily. Med plus   Yes Historical Provider, MD  sucralfate (CARAFATE) 1 GM/10ML suspension 1 g by PEG Tube route 3 (three) times daily. FOR GASTRIC PROTECTION   Yes Historical Provider, MD  terazosin (HYTRIN) 1 MG capsule 1 mg by PEG Tube route at bedtime.    Yes Historical Provider, MD  thiamine (VITAMIN B-1) 100 MG tablet 100 mg by PEG Tube route daily.    Yes Historical Provider, MD  TUBERCULIN PPD ID Inject 1 each into the skin See admin instructions. Every 10 days. Two step PPD.   Yes Historical Provider, MD  Water For Irrigation, Sterile (STERILE WATER FOR IRRIGATION) Irrigate with 360 mLs as directed every 8 (eight) hours.   Yes Historical Provider, MD  ondansetron (ZOFRAN-ODT) 4 MG disintegrating tablet 4 mg by PEG Tube route every 8 (eight) hours as needed for nausea or vomiting.     Historical Provider, MD   No Known Allergies  FAMILY HISTORY:  Family History  Problem Relation Age of Onset  . Diabetes Cousin   . Cancer Mother    SOCIAL HISTORY:  reports that he quit smoking about 6 weeks ago. His smoking use included Cigarettes. He has a 15 pack-year smoking history. He has never used smokeless tobacco. He reports that he does not drink alcohol or use illicit drugs.  REVIEW OF SYSTEMS:  NA  SUBJECTIVE:   VITAL SIGNS: Temp:  [98.1 F (36.7 C)-99.2 F (37.3 C)] 98.6 F (37 C) (09/02 0800) Pulse Rate:  [32-100] 100 (09/02 0900) Resp:  [18-31] 27 (09/02 0900) BP: (79-119)/(46-82) 90/55 mmHg (09/02 0900) SpO2:  [97 %-100 %] 99 % (09/02 0900) FiO2 (%):  [45 %-50 %] 45 % (09/01 1300) HEMODYNAMICS:   VENTILATOR SETTINGS: Vent Mode:  [-]  FiO2 (%):  [45 %-50 %] 45 % INTAKE / OUTPUT:  Intake/Output Summary (Last 24 hours) at 03/02/14 1102 Last data filed at 03/02/14 0600  Gross per 24 hour  Intake   1525 ml  Output    845 ml  Net    680 ml    PHYSICAL EXAMINATION: General: Frail, thin , lethargic AAM Neuro:  Arouses but has dull  affect HEENT:  No JVD/LAN Cardiovascular:  HSR RRR Tachycardia Lungs:  Coarse rhonchi (improved today), improving respiratory effort, mild dec bs at the bases Abdomen: PEG Musculoskeletal:  intact Skin:  warm  LABS:  CBC  Recent Labs Lab 02/28/14 1636 02/28/14 1918 03/01/14 0630  WBC 6.4 6.4 7.7  HGB 7.4* 6.7* 10.3*  HCT 22.2* 19.7* 30.8*  PLT 185 167 159   Coag's No results found for this basename: APTT, INR,  in the last 168 hours BMET  Recent Labs Lab 02/28/14 1636 02/28/14 1918 03/01/14 0630 03/02/14 0545  NA 134*  --  135* 135*  K 4.4  --  4.2 3.9  CL 96  --  100 99  CO2 21  --  21 21  BUN 27*  --  27* 36*  CREATININE 1.15 1.17 1.17 1.41*  GLUCOSE 174*  --  154* 466*   Electrolytes  Recent Labs Lab 02/28/14 1636 03/01/14 0630 03/02/14 0545  CALCIUM 9.1 8.3* 8.4   Sepsis Markers  Recent Labs Lab 02/28/14 2023 03/01/14 1218 03/02/14 0545  LATICACIDVEN 1.8  --   --   PROCALCITON  --  53.44 52.46   ABG No results found for this basename: PHART, PCO2ART, PO2ART,  in the last 168 hours Liver Enzymes  Recent Labs Lab 02/27/14 1620 02/28/14 1636 03/01/14 0630  AST 18 22 65*  ALT 10 11 34  ALKPHOS 68 71 109  BILITOT 0.4 0.6 1.0  ALBUMIN 3.0* 3.1* 2.6*   Cardiac Enzymes  Recent Labs Lab 02/28/14 1636 02/28/14 1918 03/01/14 0630 03/01/14 1218  TROPONINI <0.30 0.55* 0.38* 0.64*  PROBNP 29729.0*  --   --   --    Glucose  Recent Labs Lab 03/01/14 0807 03/02/14 0726  GLUCAP 175* 446*    Imaging Dg Chest Port 1 View  03/01/2014   CLINICAL DATA:  Shortness of breath.  EXAM: PORTABLE CHEST - 1 VIEW  COMPARISON:  March 09, 2014.  FINDINGS: Stable cardiomediastinal silhouette. Right internal jugular Port-A-Cath is unchanged in position. No pneumothorax or significant pleural effusion is noted. Stable airspace opacity is noted laterally in the right midlung concerning for pneumonia or subsegmental atelectasis. Stable central pulmonary  vascular congestion is noted as well as probable bilateral pulmonary edema.  IMPRESSION: Stable central pulmonary vascular congestion and pulmonary edema is noted. Stable opacity is noted laterally in right midlung consistent with atelectasis or pneumonia.   Electronically Signed   By: Sabino Dick M.D.   On: 03/01/2014 08:25   Dg Chest Port 1 View  03/01/2014   CLINICAL DATA:  Pneumonia.  EXAM: PORTABLE CHEST - 1 VIEW  COMPARISON:  PA and lateral chest 02/28/2014 and 01/31/2014. PET CT scan 12/21/2013.  FINDINGS: Port-A-Cath remains in place. The lungs are emphysematous. Airspace opacity in the right mid lung zone persists but appears mildly improved. Cardiomegaly and pulmonary vascular congestion are again seen. PEG tube is noted.  IMPRESSION: Airspace opacity right mid lung zone likely due to pneumonia appears mildly improved.  Cardiomegaly and vascular congestion.  Emphysema.   Electronically Signed   By: Inge Rise M.D.   On: 03/01/2014 07:27     ASSESSMENT / PLAN:  PULMONARY OETT A: Hypoxic resp failure with presumed pna in  esophogeal cancer pt (?aspiration),  COPD with recent tobacco cessation ? Hypercarbia(lethargic) AECOPD  P:   O2 as needed, maintain sats >88% Back to baseline today, no need for intubation CPAP as needed BD's Abx On steroids  CARDIOVASCULAR CVL Porta cath A:  Tachycardia Hypotension  CAD NICM CHF  P:  BP mildly dec, but no need for pressors Cont with Abx  Consider 2 d echo ? Cards consult  RENAL A:   No acute issue P:     GASTROINTESTINAL A:    PEG tube P:   Per IM TF as tolerated  HEMATOLOGIC A:   Anemia of chronic dz Esophogeal cancer P:  Follow cbc and tx per protocol Hemonc input for possible transfusion rxn (patient developed respiratory distress, fever, and hypotension during PRBC infusion).   INFECTIOUS A:  Presumed Pna (aspiration,inflitrate mostly in RLL) in immunocompromised pt P:   BCx2 8/31>> UC   8/31>> Sputum Abx: Vanc, start  date 8/31, day 2/7  Abx zoysn , start date 8/31, day 2/7   ENDOCRINE A:   Hyperglycemia on steroids  P:   SSI  NEUROLOGIC A:   Lethargic but arouable P:   RASS goal: 1 Rule out hypercarbia  TODAY'S SUMMARY:  63 yo former smoker, dx with squamous cell carcinoma 6/15, he has had 26 /30 rtx treatments and  was sent from [facility] oncology to [facility] ED 8/31 for elevated heart rate, increased wob, presumed pna and FTT, now with improvement back to baseline respiratory status with mild diuresis and supplemental O2. Will signoff today  I have personally obtained a history, examined the patient, evaluated laboratory and imaging results, formulated the assessment and plan and placed orders.  Consult time - 26mins   Vilinda Boehringer, MD [facility] Pager [phone] On Call Pager [phone]

## 2014-03-02 NOTE — Progress Notes (Addendum)
PROGRESS NOTE  Michael Keith VOJ:[id number] DOB: 20-Mar-1951 DOA: 02/28/2014 PCP: Philis Fendt, MD  Assessment/Plan: acute on chronic resp failure with hypoxia: multifactorial.  -Secondary to HCAP, COPD exacerbation  -remains in stepdown unit  -d/c IV lasix  -continue solumedrol, pulmicort and PRN nebulizer  -patient is on vanc and zosyn  -continue oxygen supplementation  -appreciate PCCM assistance -normally on [facility]L at [facility] -wean steroids per PCCM sepsis  -present at time of admission - due to HCAP and bacteremia  -follow cx's sensitivity  -continue supportive care and broad spectrum antibiotics  -PCCM consulted for assistance in management as patient might need pressors Bacteremia -staphylococcus species -surveillance blood culture -may need removal of port-a-cath elevated troponin:  -appears to be demand ischemia in nature.  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  Shortness of breath.  Chest pain.  EXAM: CHEST  2 VIEW  COMPARISON:  01/31/2014  FINDINGS: The patient is rotated to the Right on today's radiograph, reducing diagnostic sensitivity and specificity. Abnormal airspace opacities observed in the right mid lung and right lung base. Indistinct pulmonary  vasculature with cardiopericardial silhouette enlarged compared to the prior exam.  Power injectable Port-A-Cath tip:  Lower SVC.  Emphysema noted.  No pleural effusion identified.  IMPRESSION: 1. Newly enlarged cardiopericardial silhouette with indistinct pulmonary vasculature favoring pulmonary venous hypertension/congestive heart failure. However, there is also confluent airspace opacity in the right lower lobe and potentially right middle lobe which could reflect asymmetric edema or pneumonia. 2. Emphysema.   Electronically Signed   By: Sherryl Barters M.D.   On: 02/28/2014 17:44   Dg Chest Port 1 View  03/02/2014   CLINICAL DATA:  Shortness of breath, infiltrate.  EXAM: PORTABLE CHEST - 1 VIEW  COMPARISON:  03/01/2014  FINDINGS: Degraded by rotation. Prominent cardiomediastinal contours. Aortic tortuosity. Right chest wall Port-A-Cath with tip projecting over the mid SVC. Mild peripheral opacity right lower lung, improving. Mild increased left infrahilar opacity. Background interstitial prominence/ COPD. Small right pleural effusion. No pneumothorax. Osteopenia.  IMPRESSION: Improving aeration along the periphery of the right lung. Mild residual right lung base opacity and mild increased left infrahilar opacity; atelectasis versus infiltrate.  Small right pleural effusion.  Interstitial prominence/COPD.  Mild interstitial edema not excluded.   Electronically Signed   By: Carlos Levering M.D.   On: 03/02/2014 05:57   Dg Chest Port 1 View  03/01/2014   CLINICAL DATA:  Shortness of breath.  EXAM: PORTABLE CHEST - 1 VIEW  COMPARISON:  March 09, 2014.  FINDINGS: Stable cardiomediastinal silhouette. Right internal jugular Port-A-Cath is unchanged in position. No pneumothorax or significant pleural effusion is noted. Stable airspace opacity is noted laterally in the right midlung concerning for pneumonia or subsegmental atelectasis. Stable central pulmonary  vascular congestion is noted as well as probable  bilateral pulmonary edema.  IMPRESSION: Stable central pulmonary vascular congestion and pulmonary edema is noted. Stable opacity is noted laterally in right midlung consistent with atelectasis or pneumonia.   Electronically Signed   By: Sabino Dick M.D.   On: 03/01/2014 08:25   Dg Chest Port 1 View  03/01/2014   CLINICAL DATA:  Pneumonia.  EXAM: PORTABLE CHEST - 1 VIEW  COMPARISON:  PA and lateral chest 02/28/2014 and 01/31/2014. PET CT scan 12/21/2013.  FINDINGS: Port-A-Cath remains in place. The lungs are emphysematous. Airspace opacity in the right mid lung zone persists but appears mildly improved. Cardiomegaly and pulmonary vascular congestion are again seen. PEG tube is noted.  IMPRESSION: Airspace opacity right mid lung zone likely due to pneumonia appears mildly improved.  Cardiomegaly and vascular congestion.  Emphysema.   Electronically Signed   By: Inge Rise M.D.   On: 03/01/2014 07:27   Portable Chest 1 View  01/31/2014   CLINICAL DATA:  Cough and chest congestion.  EXAM: PORTABLE CHEST - 1 VIEW  COMPARISON:  04/25/2010.  FINDINGS: Normal sized heart with a marked interval decrease in size. Clear lungs. The lungs remain hyperexpanded. Right jugular port catheter tip in the superior vena cava. Mild scoliosis.  IMPRESSION: No acute abnormality.  Stable cardiomegaly.   Electronically Signed   By: Enrique Sack M.D.   On: 01/31/2014 18:57   Dg Abd 2 Views  02/04/2014   CLINICAL DATA:  Nausea and vomiting  EXAM: ABDOMEN - 2 VIEW  COMPARISON:  None.  FINDINGS: Gastrostomy tube is in place. No free intraperitoneal gas on the decubitus image. Diffuse colonic distension is noted. Phleboliths project over the pelvis.  IMPRESSION: Diffuse colonic distention.  No free intraperitoneal gas.   Electronically Signed   By: Maryclare Bean M.D.   On: 02/04/2014 11:12         Subjective: Patient is breathing better. Denies any fevers, chills, chest discomfort, shortness breath, nausea, vomiting,  diarrhea  Objective: Filed Vitals:   03/02/14 0600 03/02/14 0700 03/02/14 0800 03/02/14 0900  BP: 119/61 109/82 92/46   Pulse: 46 49 47   Temp:   98.6 F (37 C)   TempSrc:   Oral   Resp: 18 24 21    Height:      Weight:      SpO2: 100% 100% 99% 99%    Intake/Output Summary (Last 24 hours) at 03/02/14 0928 Last data filed at 03/02/14 0600  Gross per 24 hour  Intake   1625 ml  Output    845 ml  Net    780 ml   Weight change:  Exam:   General:  Pt is alert, follows commands appropriately, not in acute distress  HEENT: No icterus, No thrush, [place]/AT  Cardiovascular: RRR, S1/S2, no rubs, no gallops  Respiratory: Bibasilar crackles. No wheezing. Good air movement  Abdomen: Soft/+BS, non tender, non distended, no guarding  Extremities: trace LE edema, No lymphangitis, No petechiae, No rashes, no synovitis  Data Reviewed: Basic Metabolic Panel:  Recent Labs Lab 02/27/14 1620 02/28/14 1636 02/28/14 1918 03/01/14 0630 03/02/14 0545  NA 136* 134*  --  135* 135*  K 4.8 4.4  --  4.2 3.9  CL 101 96  --  100 99  CO2 24 21  --  21 21  GLUCOSE 127* 174*  --  154* 466*  BUN 25* 27*  --  27* 36*  CREATININE 1.01 1.15 1.17 1.17 1.41*  CALCIUM 9.1 9.1  --  8.3* 8.4   Liver Function Tests:  Recent Labs Lab 02/27/14 1620 02/28/14 1636 03/01/14 0630  AST 18 22 65*  ALT 10 11 34  ALKPHOS 68 71 109  BILITOT 0.4 0.6 1.0  PROT 7.2 7.7 7.3  ALBUMIN 3.0* 3.1* 2.6*   No results found for this basename: LIPASE, AMYLASE,  in the last 168 hours No results found for this basename: AMMONIA,  in the last 168 hours CBC:  Recent Labs Lab 02/27/14 1620 02/28/14 1636 02/28/14 1918 03/01/14 0630  WBC 2.7* 6.4 6.4 7.7  NEUTROABS 1.6* 5.5  --   --   HGB 7.5* 7.4* 6.7* 10.3*  HCT 22.8* 22.2* 19.7* 30.8*  MCV 89.8 89.2 88.7 86.5  PLT 185 185 167 159   Cardiac Enzymes:  Recent Labs Lab 02/28/14 1636 02/28/14 1918 03/01/14 0630 03/01/14 1218  TROPONINI <0.30 0.55* 0.38*  0.64*   BNP: No components found with this basename: POCBNP,  CBG:  Recent Labs Lab 03/01/14 0807 03/02/14 0726  GLUCAP 175* 446*    Recent Results (from the past 240 hour(s))  CULTURE, BLOOD (ROUTINE X 2)     Status: None   Collection Time    02/28/14  4:36 PM      Result Value Ref Range Status   Specimen Description BLOOD RIGHT HAND   Final   Special Requests BOTTLES DRAWN AEROBIC AND ANAEROBIC 3 ML   Final   Culture  Setup Time     Final   Value: 02/28/2014 18:52     Performed at [facility]   Culture     Final   Value: STAPHYLOCOCCUS SPECIES     Note: Gram Stain Report Called to,Read Back By and Verified With:  LISA FREY @[id number]  03/01/14 SMIAS     Performed at [facility]   Report Status PENDING   Incomplete  CULTURE, BLOOD (ROUTINE X 2)     Status: None   Collection Time    02/28/14  4:36 PM      Result Value Ref Range Status   Specimen Description BLOOD RIGHT PORT   Final   Special Requests BOTTLES DRAWN AEROBIC AND ANAEROBIC 5 ML   Final   Culture  Setup Time     Final   Value: 02/28/2014 18:53     Performed at [facility]   Culture     Final   Value: STAPHYLOCOCCUS SPECIES     Note: RIFAMPIN AND GENTAMICIN SHOULD NOT BE USED AS SINGLE DRUGS FOR TREATMENT OF STAPH INFECTIONS.     Note: Gram Stain Report Called to,Read Back By and Verified With:  Clydene Fake [phone] 03/01/14 SMIAS     Performed at [facility]   Report Status PENDING   Incomplete  MRSA PCR SCREENING     Status: None   Collection Time    02/28/14  9:33 PM      Result Value Ref Range Status   MRSA by PCR NEGATIVE  NEGATIVE Final   Comment:            The GeneXpert MRSA Assay (FDA     approved for NASAL specimens     only), is one component of a     comprehensive MRSA colonization     surveillance program. It is not     intended to diagnose MRSA     infection nor to guide or     monitor treatment for     MRSA  infections.     Scheduled Meds: . sodium  chloride   Intravenous Once  . aspirin EC  81 mg Oral q morning - 10a  . budesonide (PULMICORT) nebulizer solution  0.25 mg Nebulization BID  . feeding supplement (OSMOLITE 1.5 CAL)  1,000 mL Per Tube Q24H  . folic acid  1 mg Per Tube Daily  . furosemide  20 mg Intravenous Q12H  . heparin  5,000 Units Subcutaneous 3 times per day  . insulin aspart  0-9 Units Subcutaneous Q4H  . ipratropium  0.5 mg Nebulization Q6H  . levalbuterol  1.25 mg Nebulization Q6H  . methylPREDNISolone (SOLU-MEDROL) injection  40 mg Intravenous Q6H  . multivitamin with minerals  1 tablet Per Tube Daily  . nicotine  21 mg Transdermal Daily  . piperacillin-tazobactam (ZOSYN)  IV  3.375 g Intravenous Q8H  . sodium chloride  3 mL Intravenous Q12H  . sterile water for irrigation  360 mL Irrigation 3 times per day  . sucralfate  1 g Per Tube TID  . terazosin  1 mg Per Tube QHS  . thiamine  100 mg Per Tube Daily  . vancomycin  1,000 mg Intravenous Q24H   Continuous Infusions:    Hisako Bugh, DO  [facility] Pager [phone]  If 7PM-7AM, please contact night-coverage [email or web address] Password [email or web address] 03/02/2014, 9:28 AM   LOS: 2 days

## 2014-03-02 NOTE — Progress Notes (Signed)
Pharmacist Heart Failure Core Measure Documentation  Patient has an EF documented as 15% on 03/01/14 by 2D Echo.  Rationale: Heart failure patients with left ventricular systolic dysfunction (LVSD) and an EF < 40% should be prescribed an angiotensin converting enzyme inhibitor (ACEI) or angiotensin receptor blocker (ARB) at discharge unless a contraindication is documented in the medical record.  This patient is not currently on an ACEI or ARB for HF.  This note is being placed in the record in order to provide documentation that a contraindication to the use of these agents is present for this encounter.  ACE Inhibitor or Angiotensin Receptor Blocker is currently contraindicated (specify all that apply).  MD to re-evaluate status as warranted.  []   ACEI allergy AND ARB allergy []   Angioedema []   Moderate or severe aortic stenosis []   Hyperkalemia [x]   Hypotension []   Renal artery stenosis []   Worsening renal function, preexisting renal disease or dysfunction   Clayburn Pert, PharmD, BCPS Pager: [phone] 03/02/2014  6:51 AM

## 2014-03-02 NOTE — Consult Note (Signed)
[facility] CONSULT NOTE  Date: 03/02/2014               Patient Name:  Michael Keith MRN: [id number]  DOB: 1951/06/16 Age / Sex: 63 y.o., male        PCP: Nolene Ebbs A Primary Cardiologist: Ron Parker            Referring Physician: Tat              Reason for Consult: + Troponin           History of Present Illness: Patient is a 63 y.o. male with a PMHx of esophageal cancer, COPD, chronic systolic congestive heart failure, who was admitted to [facility] on 02/28/2014 for evaluation of generalized weakness, cough, and production of yellow sputum. He was originally seen by his oncologist. He was sent to the emergency room and was found to have infiltrates on his chest x-ray.Marland Kitchen   He's had some generalized chest pain. The pain was fairly constant and did not sound anginal. Troponin levels were drawn and were found to be incidentally mildly elevated. He denies any worsening shortness of breath. He's had multiple visits to the oncologist and radiation oncologist recently for treatment of his esophageal cancer.  Medications: Outpatient medications: Prescriptions prior to admission  Medication Sig Dispense Refill  . aspirin EC 81 MG tablet 81 mg by PEG Tube route every morning.       Marland Kitchen HYDROcodone-acetaminophen (HYCET) 7.5-325 mg/15 ml solution 15 mLs by PEG Tube route every 6 (six) hours as needed (FOR PAIN RELATED TO MALIGNANT NEOPLASM OF ESOPHAGUS).       Marland Kitchen lidocaine-prilocaine (EMLA) cream Apply 1 application topically as needed (port access.).      Marland Kitchen metoCLOPramide (REGLAN) 5 MG tablet Take 5 mg by mouth every 8 (eight) hours as needed for nausea.      . Multiple Vitamin (MULTIVITAMIN WITH MINERALS) TABS tablet 1 tablet by PEG Tube route daily.       . nicotine (NICODERM CQ - DOSED IN MG/24 HOURS) 21 mg/24hr patch Place 21 mg onto the skin daily.      . nitroGLYCERIN (NITROSTAT) 0.4 MG SL tablet Place 0.4 mg under the tongue every 5 (five) minutes x 3 doses as needed for chest pain.       .  Nutritional Supplements (FEEDING SUPPLEMENT, OSMOLITE 1.5 CAL,) LIQD 1,000 mLs by PEG Tube route continuous. 115 ml/hr  For 10 hrs  (8pm-6am)      . omeprazole-sodium bicarbonate (ZEGERID) 40-1100 MG per capsule Take 1 capsule by mouth See admin instructions. Via Peg-tube once daily      . PRESCRIPTION MEDICATION 120 mLs by PEG Tube route 3 (three) times daily. Med plus      . sucralfate (CARAFATE) 1 GM/10ML suspension 1 g by PEG Tube route 3 (three) times daily. FOR GASTRIC PROTECTION      . terazosin (HYTRIN) 1 MG capsule 1 mg by PEG Tube route at bedtime.       . thiamine (VITAMIN B-1) 100 MG tablet 100 mg by PEG Tube route daily.       . TUBERCULIN PPD ID Inject 1 each into the skin See admin instructions. Every 10 days. Two step PPD.      . Water For Irrigation, Sterile (STERILE WATER FOR IRRIGATION) Irrigate with 360 mLs as directed every 8 (eight) hours.      . ondansetron (ZOFRAN-ODT) 4 MG disintegrating tablet 4 mg by PEG Tube route every 8 (eight)  hours as needed for nausea or vomiting.         Current medications: Current Facility-Administered Medications  Medication Dose Route Frequency Provider Last Rate Last Dose  . 0.9 %  sodium chloride infusion   Intravenous Once Allyne Gee, MD      . acetaminophen (TYLENOL) tablet 650 mg  650 mg Oral Q6H PRN Allyne Gee, MD   650 mg at 02/28/14 2258   Or  . acetaminophen (TYLENOL) suppository 650 mg  650 mg Rectal Q6H PRN Allyne Gee, MD      . aspirin EC tablet 81 mg  81 mg Oral q morning - 10a Allyne Gee, MD   81 mg at 03/02/14 0916  . budesonide (PULMICORT) nebulizer solution 0.25 mg  0.25 mg Nebulization BID Barton Dubois, MD   0.25 mg at 03/02/14 0857  . feeding supplement (OSMOLITE 1.5 CAL) liquid 1,000 mL  1,000 mL Per Tube Q24H Allyne Gee, MD 115 mL/hr at 03/01/14 2120 1,000 mL at 50/09/38 1829  . folic acid (FOLVITE) tablet 1 mg  1 mg Per Tube Daily Allyne Gee, MD   1 mg at 03/02/14 0916  . heparin injection 5,000  Units  5,000 Units Subcutaneous 3 times per day Allyne Gee, MD   5,000 Units at 03/02/14 0543  . HYDROcodone-acetaminophen (HYCET) 7.5-325 mg/15 ml solution 15 mL  15 mL Per Tube Q6H PRN Allyne Gee, MD      . insulin aspart (novoLOG) injection 0-9 Units  0-9 Units Subcutaneous Q4H David Tat, MD      . ipratropium (ATROVENT) nebulizer solution 0.5 mg  0.5 mg Nebulization Q6H Barton Dubois, MD   0.5 mg at 03/02/14 0857  . levalbuterol (XOPENEX) nebulizer solution 1.25 mg  1.25 mg Nebulization Q6H Barton Dubois, MD   1.25 mg at 03/02/14 0857  . lidocaine-prilocaine (EMLA) cream 1 application  1 application Topical PRN Allyne Gee, MD      . methylPREDNISolone sodium succinate (SOLU-MEDROL) 40 mg/mL injection 40 mg  40 mg Intravenous Q6H Barton Dubois, MD   40 mg at 03/02/14 0740  . morphine 2 MG/ML injection 1 mg  1 mg Intravenous Q4H PRN Allyne Gee, MD      . multivitamin with minerals tablet 1 tablet  1 tablet Per Tube Daily Allyne Gee, MD   1 tablet at 03/02/14 0916  . nicotine (NICODERM CQ - dosed in mg/24 hours) patch 21 mg  21 mg Transdermal Daily Allyne Gee, MD   21 mg at 03/02/14 0916  . nitroGLYCERIN (NITROSTAT) SL tablet 0.4 mg  0.4 mg Sublingual Q5 Min x 3 PRN Allyne Gee, MD      . ondansetron [facility]) tablet 4 mg  4 mg Oral Q6H PRN Allyne Gee, MD       Or  . ondansetron (ZOFRAN) injection 4 mg  4 mg Intravenous Q6H PRN Allyne Gee, MD      . piperacillin-tazobactam (ZOSYN) IVPB 3.375 g  3.375 g Intravenous Q8H Emiliano Dyer, RPH   3.375 g at 03/02/14 0916  . sodium chloride 0.9 % injection 3 mL  3 mL Intravenous Q12H Allyne Gee, MD   3 mL at 03/01/14 2139  . sorbitol 70 % solution 30 mL  30 mL Per Tube Daily PRN Allyne Gee, MD   30 mL at 02/28/14 2248  . sterile water for irrigation for irrigation 360 mL  360 mL Irrigation 3  times per day Allyne Gee, MD   360 mL at 03/02/14 0545  . sucralfate (CARAFATE) 1 GM/10ML suspension 1 g  1 g Per Tube TID Allyne Gee, MD   1 g at 03/02/14 0915  . terazosin (HYTRIN) capsule 1 mg  1 mg Per Tube QHS Allyne Gee, MD      . thiamine (VITAMIN B-1) tablet 100 mg  100 mg Per Tube Daily Allyne Gee, MD   100 mg at 03/02/14 0916  . vancomycin (VANCOCIN) IVPB 1000 mg/200 mL premix  1,000 mg Intravenous Q24H Emiliano Dyer, RPH   1,000 mg at 03/01/14 1700     No Known Allergies   Past Medical History  Diagnosis Date  . Chronic pancreatitis     CT scan shows improving pancreatitis  . Nonischemic cardiomyopathy 09/2009    Catheterization, April, 2011, questionable occlusion of the most apical portion of the LAD, LV dysfunction out of proportion to coronary disease  . Chronic systolic CHF (congestive heart failure)     April, 2011 mild troponin elevation at that time  . Mitral regurgitation     mild, echo April 2011  . Tobacco abuse   . Alcohol use     heavy until Jan 2011, patient was counseled concerning alcohol in April 2011  . COPD (chronic obstructive pulmonary disease)   . Fluid overload 03/2010    October, 2011  . Cough Jan 2012    may be from enalapril, January, 2012  . Ejection fraction < 50%     EF 35-40%, echo, April, 2011  . CAD (coronary artery disease)     Catheterization, April, 2011, questionable occlusion of the most apical portion of the LAD, LV dysfunction out of proportion to coronary disease  . Squamous cell esophageal cancer   . Severe protein-calorie malnutrition 01/31/2014    Past Surgical History  Procedure Laterality Date  . Cardiac catheterization  April 2011    questionable occlusion of the most apical portion of the LAD / LV dysfunction out of proportion to coronary disease  . Wrist surgey      left wrist  . Laparoscopic gastrostomy N/A 02/02/2014    Procedure: LAPAROSCOPIC GASTROSTOMY TUBE PLACEMENT;  Surgeon: Pedro Earls, MD;  Location: [facility];  Service: General;  Laterality: N/A;    Family History  Problem Relation Age of Onset  . Diabetes Cousin   .  Cancer Mother     Social History:  reports that he quit smoking about 6 weeks ago. His smoking use included Cigarettes. He has a 15 pack-year smoking history. He has never used smokeless tobacco. He reports that he does not drink alcohol or use illicit drugs.   Review of Systems: Constitutional:  admits to fever, chills.  He has a good  appetite but has not been able to eat much because of a sore throat ( XRT to throat)   HEENT: denies photophobia, eye pain, redness, hearing loss, ear pain, congestion, sore throat, rhinorrhea, sneezing, neck pain, neck stiffness and tinnitus.  Respiratory: denies SOB, DOE, cough, chest tightness, and wheezing.  Cardiovascular: admits to  mild atypical chest pain,  Gastrointestinal: denies nausea, vomiting, abdominal pain, diarrhea, constipation, blood in stool.  Genitourinary: denies dysuria, urgency, frequency, hematuria, flank pain and difficulty urinating.  Musculoskeletal: denies  myalgias, back pain, joint swelling, arthralgias and gait problem.   Skin: denies pallor, rash and wound.  Neurological: admits to dizziness,  light-headedness, numbness and headaches.   Hematological: denies  adenopathy, easy bruising, personal or family bleeding history.  Psychiatric/ Behavioral: denies suicidal ideation, mood changes, confusion, nervousness, sleep disturbance and agitation.    Physical Exam: BP 90/55  Pulse 100  Temp(Src) 98.6 F (37 C) (Oral)  Resp 27  Ht 5\' 6"  (1.676 m)  Wt 104 lb 11.5 oz (47.5 kg)  BMI 16.91 kg/m2  SpO2 99%  Wt Readings from Last 3 Encounters:  02/28/14 104 lb 11.5 oz (47.5 kg)  02/28/14 103 lb 14.4 oz (47.129 kg)  02/21/14 104 lb 1.6 oz (47.219 kg)    General: Vital signs reviewed and noted. Chronically ill-appearing black gentleman. He is lying supine in bed without any distress.   Head: Normocephalic, atraumatic, sclera anicteric, poor dentition  Neck: Supple. Negative for carotid bruits. No JVD   Lungs:  Clear  bilaterally, no  wheezes, rales, or rhonchi. Breathing is normal   Heart: RRR with S1 S2. Soft systolic murmur  Abdomen:  Soft, non-tender, non-distended with normoactive bowel sounds. No hepatomegaly. No rebound/guarding. No obvious abdominal masses.  Very thin  MSK: Strength and the appear normal for age.   Extremities: No clubbing or cyanosis. No edema.  Distal pedal pulses are 2+ and equal   Neurologic: Alert and oriented X 3. Moves all extremities spontaneously.  Psych: Responds to questions appropriately with a normal affect.     Lab results: Basic Metabolic Panel:  Recent Labs Lab 02/28/14 1636 02/28/14 1918 03/01/14 0630 03/02/14 0545  NA 134*  --  135* 135*  K 4.4  --  4.2 3.9  CL 96  --  100 99  CO2 21  --  21 21  GLUCOSE 174*  --  154* 466*  BUN 27*  --  27* 36*  CREATININE 1.15 1.17 1.17 1.41*  CALCIUM 9.1  --  8.3* 8.4    Liver Function Tests:  Recent Labs Lab 02/27/14 1620 02/28/14 1636 03/01/14 0630  AST 18 22 65*  ALT 10 11 34  ALKPHOS 68 71 109  BILITOT 0.4 0.6 1.0  PROT 7.2 7.7 7.3  ALBUMIN 3.0* 3.1* 2.6*   No results found for this basename: LIPASE, AMYLASE,  in the last 168 hours No results found for this basename: AMMONIA,  in the last 168 hours  CBC:  Recent Labs Lab 02/27/14 1620 02/28/14 1636 02/28/14 1918 03/01/14 0630  WBC 2.7* 6.4 6.4 7.7  NEUTROABS 1.6* 5.5  --   --   HGB 7.5* 7.4* 6.7* 10.3*  HCT 22.8* 22.2* 19.7* 30.8*  MCV 89.8 89.2 88.7 86.5  PLT 185 185 167 159    Cardiac Enzymes:  Recent Labs Lab 02/28/14 1636 02/28/14 1918 03/01/14 0630 03/01/14 1218  TROPONINI <0.30 0.55* 0.38* 0.64*    BNP: No components found with this basename: POCBNP,   CBG:  Recent Labs Lab 03/01/14 0807 03/02/14 0726  GLUCAP 175* 446*    Coagulation Studies: No results found for this basename: LABPROT, INR,  in the last 72 hours   Other results: EKG :    NSR with frequent PVCs   Imaging: Dg Chest 2 View  02/28/2014    CLINICAL DATA:  Fever.  Shortness of breath.  Chest pain.  EXAM: CHEST  2 VIEW  COMPARISON:  01/31/2014  FINDINGS: The patient is rotated to the Right on today's radiograph, reducing diagnostic sensitivity and specificity. Abnormal airspace opacities observed in the right mid lung and right lung base. Indistinct pulmonary vasculature with cardiopericardial silhouette enlarged compared to the prior exam.  Power injectable  Port-A-Cath tip:  Lower SVC.  Emphysema noted.  No pleural effusion identified.  IMPRESSION: 1. Newly enlarged cardiopericardial silhouette with indistinct pulmonary vasculature favoring pulmonary venous hypertension/congestive heart failure. However, there is also confluent airspace opacity in the right lower lobe and potentially right middle lobe which could reflect asymmetric edema or pneumonia. 2. Emphysema.   Electronically Signed   By: Sherryl Barters M.D.   On: 02/28/2014 17:44   Dg Chest Port 1 View  03/02/2014   CLINICAL DATA:  Shortness of breath, infiltrate.  EXAM: PORTABLE CHEST - 1 VIEW  COMPARISON:  03/01/2014  FINDINGS: Degraded by rotation. Prominent cardiomediastinal contours. Aortic tortuosity. Right chest wall Port-A-Cath with tip projecting over the mid SVC. Mild peripheral opacity right lower lung, improving. Mild increased left infrahilar opacity. Background interstitial prominence/ COPD. Small right pleural effusion. No pneumothorax. Osteopenia.  IMPRESSION: Improving aeration along the periphery of the right lung. Mild residual right lung base opacity and mild increased left infrahilar opacity; atelectasis versus infiltrate.  Small right pleural effusion.  Interstitial prominence/COPD.  Mild interstitial edema not excluded.   Electronically Signed   By: Carlos Levering M.D.   On: 03/02/2014 05:57   Dg Chest Port 1 View  03/01/2014   CLINICAL DATA:  Shortness of breath.  EXAM: PORTABLE CHEST - 1 VIEW  COMPARISON:  March 09, 2014.  FINDINGS: Stable cardiomediastinal  silhouette. Right internal jugular Port-A-Cath is unchanged in position. No pneumothorax or significant pleural effusion is noted. Stable airspace opacity is noted laterally in the right midlung concerning for pneumonia or subsegmental atelectasis. Stable central pulmonary vascular congestion is noted as well as probable bilateral pulmonary edema.  IMPRESSION: Stable central pulmonary vascular congestion and pulmonary edema is noted. Stable opacity is noted laterally in right midlung consistent with atelectasis or pneumonia.   Electronically Signed   By: Sabino Dick M.D.   On: 03/01/2014 08:25   Dg Chest Port 1 View  03/01/2014   CLINICAL DATA:  Pneumonia.  EXAM: PORTABLE CHEST - 1 VIEW  COMPARISON:  PA and lateral chest 02/28/2014 and 01/31/2014. PET CT scan 12/21/2013.  FINDINGS: Port-A-Cath remains in place. The lungs are emphysematous. Airspace opacity in the right mid lung zone persists but appears mildly improved. Cardiomegaly and pulmonary vascular congestion are again seen. PEG tube is noted.  IMPRESSION: Airspace opacity right mid lung zone likely due to pneumonia appears mildly improved.  Cardiomegaly and vascular congestion.  Emphysema.   Electronically Signed   By: Inge Rise M.D.   On: 03/01/2014 07:27          Assessment & Plan:  1. Elevated troponin levels: The patient was admitted with symptoms of dehydration, hospital acquired pneumonia,  markedly elevated procalcitonin level, and a very minimally elevated troponin level. His EKG does not show any signs of ischemia. He's not having any symptoms of unstable angina. I suspect that this is due to demand ischemia.  At present he's not a candidate for any invasive procedures. In addition, given all his other medical issues I would not pursue any further noninvasive testing such as a Myoview.   Continue supportive care.  His echocardiogram shows that his left ventricle systolic function has fallen.    At present he is somewhat  hypotensive and really do not have any rhythm at 80 medications such as beta blockers or ACE inhibitors/ ARBs.    No further recs.      Thayer Headings, Brooke Bonito., MD, [facility] 03/02/2014, 10:54 AM Office - [phone] Pager [phone]       

## 2014-03-03 ENCOUNTER — Ambulatory Visit: Payer: Medicare Other

## 2014-03-03 ENCOUNTER — Telehealth: Payer: Self-pay | Admitting: *Deleted

## 2014-03-03 ENCOUNTER — Encounter (HOSPITAL_COMMUNITY): Payer: Self-pay | Admitting: *Deleted

## 2014-03-03 ENCOUNTER — Ambulatory Visit
Admission: RE | Admit: 2014-03-03 | Discharge: 2014-03-03 | Disposition: A | Discharge status: Home / Self Care | Payer: Medicare Other | Source: Ambulatory Visit | Attending: Radiation Oncology | Admitting: Radiation Oncology

## 2014-03-03 DIAGNOSIS — I079 Rheumatic tricuspid valve disease, unspecified: Secondary | ICD-10-CM

## 2014-03-03 DIAGNOSIS — I059 Rheumatic mitral valve disease, unspecified: Secondary | ICD-10-CM

## 2014-03-03 DIAGNOSIS — A4901 Methicillin susceptible Staphylococcus aureus infection, unspecified site: Secondary | ICD-10-CM

## 2014-03-03 DIAGNOSIS — I472 Ventricular tachycardia, unspecified: Secondary | ICD-10-CM

## 2014-03-03 DIAGNOSIS — D649 Anemia, unspecified: Secondary | ICD-10-CM | POA: Diagnosis present

## 2014-03-03 DIAGNOSIS — F17200 Nicotine dependence, unspecified, uncomplicated: Secondary | ICD-10-CM

## 2014-03-03 DIAGNOSIS — D72819 Decreased white blood cell count, unspecified: Secondary | ICD-10-CM | POA: Diagnosis present

## 2014-03-03 DIAGNOSIS — R7881 Bacteremia: Secondary | ICD-10-CM

## 2014-03-03 DIAGNOSIS — A419 Sepsis, unspecified organism: Secondary | ICD-10-CM

## 2014-03-03 DIAGNOSIS — A4101 Sepsis due to Methicillin susceptible Staphylococcus aureus: Secondary | ICD-10-CM

## 2014-03-03 DIAGNOSIS — N289 Disorder of kidney and ureter, unspecified: Secondary | ICD-10-CM | POA: Diagnosis not present

## 2014-03-03 DIAGNOSIS — I4729 Other ventricular tachycardia: Secondary | ICD-10-CM

## 2014-03-03 LAB — CBC
HEMATOCRIT: 27.5 % — AB (ref 39.0–52.0)
Hemoglobin: 9.6 g/dL — ABNORMAL LOW (ref 13.0–17.0)
MCH: 29.4 pg (ref 26.0–34.0)
MCHC: 34.9 g/dL (ref 30.0–36.0)
MCV: 84.4 fL (ref 78.0–100.0)
PLATELETS: 157 10*3/uL (ref 150–400)
RBC: 3.26 MIL/uL — AB (ref 4.22–5.81)
RDW: 15.8 % — ABNORMAL HIGH (ref 11.5–15.5)
WBC: 6.4 10*3/uL (ref 4.0–10.5)

## 2014-03-03 LAB — BASIC METABOLIC PANEL
Anion gap: 18 — ABNORMAL HIGH (ref 5–15)
BUN: 45 mg/dL — AB (ref 6–23)
CHLORIDE: 98 meq/L (ref 96–112)
CO2: 21 meq/L (ref 19–32)
Calcium: 8.9 mg/dL (ref 8.4–10.5)
Creatinine, Ser: 1.5 mg/dL — ABNORMAL HIGH (ref 0.50–1.35)
GFR calc Af Amer: 55 mL/min — ABNORMAL LOW (ref >90)
GFR, EST NON AFRICAN AMERICAN: 47 mL/min — AB (ref >90)
GLUCOSE: 344 mg/dL — AB (ref 70–99)
Potassium: 3.3 mEq/L — ABNORMAL LOW (ref 3.7–5.3)
Sodium: 137 mEq/L (ref 137–147)

## 2014-03-03 LAB — GLUCOSE, CAPILLARY
GLUCOSE-CAPILLARY: 301 mg/dL — AB (ref 70–99)
GLUCOSE-CAPILLARY: 128 mg/dL — AB (ref 70–99)
Glucose-Capillary: 332 mg/dL — ABNORMAL HIGH (ref 70–99)
Glucose-Capillary: 314 mg/dL — ABNORMAL HIGH (ref 70–99)
Glucose-Capillary: 65 mg/dL — ABNORMAL LOW (ref 70–99)
Glucose-Capillary: 67 mg/dL — ABNORMAL LOW (ref 70–99)
Glucose-Capillary: 124 mg/dL — ABNORMAL HIGH (ref 70–99)

## 2014-03-03 LAB — CULTURE, BLOOD (ROUTINE X 2)

## 2014-03-03 LAB — GLUCOSE, RANDOM: Glucose, Bld: 62 mg/dL — ABNORMAL LOW (ref 70–99)

## 2014-03-03 LAB — PROCALCITONIN: PROCALCITONIN: 35.19 ng/mL

## 2014-03-03 MED ORDER — DEXTROSE 50 % IV SOLN
25.0000 mL | Freq: Once | INTRAVENOUS | Status: AC | PRN
  Administered 2014-03-03: 25 mL via INTRAVENOUS

## 2014-03-03 MED ORDER — ASPIRIN 81 MG PO CHEW
81.0000 mg | CHEWABLE_TABLET | Freq: Every day | ORAL | Status: DC
  Administered 2014-03-03 – 2014-03-08 (×6): 81 mg
  Filled 2014-03-03 (×6): qty 1

## 2014-03-03 MED ORDER — DEXTROSE 50 % IV SOLN
INTRAVENOUS | Status: AC
  Filled 2014-03-03: qty 50

## 2014-03-03 MED ORDER — CEFAZOLIN SODIUM-DEXTROSE 2-3 GM-% IV SOLR
2.0000 g | Freq: Three times a day (TID) | INTRAVENOUS | Status: DC
  Administered 2014-03-03 – 2014-03-08 (×15): 2 g via INTRAVENOUS
  Filled 2014-03-03 (×17): qty 50

## 2014-03-03 MED ORDER — POTASSIUM CHLORIDE 10 MEQ/100ML IV SOLN
10.0000 meq | INTRAVENOUS | Status: AC
  Administered 2014-03-03 (×3): 10 meq via INTRAVENOUS
  Filled 2014-03-03 (×3): qty 100

## 2014-03-03 MED ORDER — INSULIN ASPART 100 UNIT/ML ~~LOC~~ SOLN
0.0000 [IU] | SUBCUTANEOUS | Status: DC
  Administered 2014-03-03: 5 [IU] via SUBCUTANEOUS
  Administered 2014-03-04: 3 [IU] via SUBCUTANEOUS
  Administered 2014-03-04: 2 [IU] via SUBCUTANEOUS
  Administered 2014-03-04: 9 [IU] via SUBCUTANEOUS
  Administered 2014-03-04: 7 [IU] via SUBCUTANEOUS
  Administered 2014-03-04 – 2014-03-05 (×2): 3 [IU] via SUBCUTANEOUS
  Administered 2014-03-05: 5 [IU] via SUBCUTANEOUS
  Administered 2014-03-05: 7 [IU] via SUBCUTANEOUS
  Administered 2014-03-05: 1 [IU] via SUBCUTANEOUS
  Administered 2014-03-06: 3 [IU] via SUBCUTANEOUS
  Administered 2014-03-06: 2 [IU] via SUBCUTANEOUS
  Administered 2014-03-06: 1 [IU] via SUBCUTANEOUS
  Administered 2014-03-06 (×2): 2 [IU] via SUBCUTANEOUS
  Administered 2014-03-07: 3 [IU] via SUBCUTANEOUS
  Administered 2014-03-07 – 2014-03-08 (×5): 1 [IU] via SUBCUTANEOUS
  Administered 2014-03-08: 2 [IU] via SUBCUTANEOUS

## 2014-03-03 MED ORDER — INSULIN ASPART 100 UNIT/ML ~~LOC~~ SOLN
0.0000 [IU] | SUBCUTANEOUS | Status: DC
  Administered 2014-03-03: 11 [IU] via SUBCUTANEOUS

## 2014-03-03 NOTE — Progress Notes (Signed)
Inpatient Diabetes Program Recommendations  AACE/ADA: New Consensus Statement on Inpatient Glycemic Control (2013)  Target Ranges:  Prepandial:   less than 140 mg/dL      Peak postprandial:   less than 180 mg/dL (1-2 hours)      Critically ill patients:  140 - 180 mg/dL     Results for Michael Keith, Michael Keith (MRN [id number]) as of 03/03/2014 09:24  Ref. Range 03/02/2014 07:26 03/02/2014 11:39 03/02/2014 16:24 03/02/2014 19:36  Glucose-Capillary Latest Range: 70-99 mg/dL 446 (H) 198 (H) 143 (H) 252 (H)    Results for Michael Keith, Michael Keith (MRN [id number]) as of 03/03/2014 09:24  Ref. Range 03/02/2014 23:49 03/03/2014 04:08 03/03/2014 08:05  Glucose-Capillary Latest Range: 70-99 mg/dL 241 (H) 332 (H) 301 (H)     Patient receiving IV Solumedrol 40 mg Q6 hours at present.    Patient also getting Osmolite tube feeds at 115 cc/hour from the hours of 8PM-6AM.  Having significant glucose elevations.  Current Orders= Novolog Sensitive SSI Q4 hours    MD- Please consider the following insulin adjustments:  1. Increase Novolog SSI to Moderate Q4 hours (currently ordered as Sensitive scale) 2. Add tube feed coverage while patient gets tube feeds during the night- Scheduled Novolog 4 units at 8PM, 12 midnight, and 4AM    Will follow Wyn Quaker RN, MSN, CDE Diabetes Coordinator Inpatient Diabetes Program Team Pager: [phone] (8a-10p)

## 2014-03-03 NOTE — Progress Notes (Addendum)
ANTIBIOTIC CONSULT NOTE   Pharmacy Consult for Vancomycin and Zosyn --> cefazolin Indication: MSSA bacteremia (suspected CRBSI)  No Known Allergies  Patient Measurements: Height: 5\' 6"  (167.6 cm) Weight: 104 lb 11.5 oz (47.5 kg) IBW/kg (Calculated) : 63.8  Vital Signs: Temp: 97.7 F (36.5 C) (09/03 0838) Temp src: Oral (09/03 0838) BP: 108/49 mmHg (09/03 0929) Pulse Rate: 122 (09/03 0929) Intake/Output from previous day: 09/02 0701 - 09/03 0700 In: 2400 [NG/GT:1150; IV Piggyback:350] Out: 575 [Urine:575] Intake/Output from this shift:    Labs:  Recent Labs  02/28/14 1918 03/01/14 0630 03/02/14 0545 03/03/14 0555  WBC 6.4 7.7  --  6.4  HGB 6.7* 10.3*  --  9.6*  PLT 167 159  --  157  CREATININE 1.17 1.17 1.41* 1.50*   Estimated Creatinine Clearance: 33.4 ml/min (by C-G formula based on Cr of 1.5). No results found for this basename: VANCOTROUGH, Corlis Leak, VANCORANDOM, GENTTROUGH, GENTPEAK, GENTRANDOM, TOBRATROUGH, TOBRAPEAK, TOBRARND, AMIKACINPEAK, AMIKACINTROU, AMIKACIN,  in the last 72 hours   Microbiology: Recent Results (from the past 720 hour(s))  SURGICAL PCR SCREEN     Status: Abnormal   Collection Time    02/02/14  8:50 AM      Result Value Ref Range Status   MRSA, PCR INVALID RESULTS, SPECIMEN SENT FOR CULTURE (*) NEGATIVE Final   Comment: NOTIFIED C. OBERRY RN AT 5427 ON 08.05.15 BY SHUEA   Staphylococcus aureus INVALID RESULTS, SPECIMEN SENT FOR CULTURE (*) NEGATIVE Final   Comment: NOTIFIED C. OBERRY RN AT 0623 ON 08.05.15 BY SHUEA                The Xpert SA Assay (FDA     approved for NASAL specimens     in patients over 73 years of age),     is one component of     a comprehensive surveillance     program.  Test performance has     been validated by [employer] for patients greater     than or equal to 38 year old.     It is not intended     to diagnose infection nor to     guide or monitor treatment.  MRSA CULTURE     Status: None   Collection Time    02/02/14  8:50 AM      Result Value Ref Range Status   Specimen Description NOSE   Final   Special Requests NONE   Final   Culture     Final   Value: NO STAPHYLOCOCCUS AUREUS ISOLATED     Note: NOMRSA     Performed at [facility]   Report Status 02/04/2014 FINAL   Final  CULTURE, BLOOD (ROUTINE X 2)     Status: None   Collection Time    02/28/14  4:36 PM      Result Value Ref Range Status   Specimen Description BLOOD RIGHT HAND   Final   Special Requests BOTTLES DRAWN AEROBIC AND ANAEROBIC 3 ML   Final   Culture  Setup Time     Final   Value: 02/28/2014 18:52     Performed at [facility]   Culture     Final   Value: STAPHYLOCOCCUS AUREUS     Note: SUSCEPTIBILITIES PERFORMED ON PREVIOUS CULTURE WITHIN THE LAST 5 DAYS.     Note: Gram Stain Report Called to,Read Back By and Verified With:  LISA FREY @[id number]  03/01/14 SMIAS  Performed at [facility]   Report Status 03/03/2014 FINAL   Final  CULTURE, BLOOD (ROUTINE X 2)     Status: None   Collection Time    02/28/14  4:36 PM      Result Value Ref Range Status   Specimen Description BLOOD RIGHT PORT   Final   Special Requests BOTTLES DRAWN AEROBIC AND ANAEROBIC 5 ML   Final   Culture  Setup Time     Final   Value: 02/28/2014 18:53     Performed at [facility]   Culture     Final   Value: STAPHYLOCOCCUS AUREUS     Note: RIFAMPIN AND GENTAMICIN SHOULD NOT BE USED AS SINGLE DRUGS FOR TREATMENT OF STAPH INFECTIONS.     Note: Gram Stain Report Called to,Read Back By and Verified With:  Clydene Fake [phone] 03/01/14 SMIAS     Performed at [facility]   Report Status 03/03/2014 FINAL   Final   Organism ID, Bacteria STAPHYLOCOCCUS AUREUS   Final  MRSA PCR SCREENING     Status: None   Collection Time    02/28/14  9:33 PM      Result Value Ref Range Status   MRSA by PCR NEGATIVE  NEGATIVE Final   Comment:            The GeneXpert MRSA Assay (FDA     approved for NASAL  specimens     only), is one component of a     comprehensive MRSA colonization     surveillance program. It is not     intended to diagnose MRSA     infection nor to guide or     monitor treatment for     MRSA infections.  CULTURE, BLOOD (ROUTINE X 2)     Status: None   Collection Time    03/02/14 10:30 AM      Result Value Ref Range Status   Specimen Description BLOOD PAC   Final   Special Requests BOTTLES DRAWN AEROBIC AND ANAEROBIC 10CC   Final   Culture  Setup Time     Final   Value: 03/02/2014 13:24     Performed at [facility]   Culture     Final   Value:        BLOOD CULTURE RECEIVED NO GROWTH TO DATE CULTURE WILL BE HELD FOR 5 DAYS BEFORE ISSUING A FINAL NEGATIVE REPORT     Performed at [facility]   Report Status PENDING   Incomplete  CULTURE, BLOOD (ROUTINE X 2)     Status: None   Collection Time    03/02/14 10:40 AM      Result Value Ref Range Status   Specimen Description BLOOD RIGHT HAND   Final   Special Requests BOTTLES DRAWN AEROBIC AND ANAEROBIC 6CC   Final   Culture  Setup Time     Final   Value: 03/02/2014 13:24     Performed at [facility]   Culture     Final   Value:        BLOOD CULTURE RECEIVED NO GROWTH TO DATE CULTURE WILL BE HELD FOR 5 DAYS BEFORE ISSUING A FINAL NEGATIVE REPORT     Performed at [facility]   Report Status PENDING   Incomplete    Assessment: 63 yo male with esophageal cancer on chemotherapy and radiation who presents from [facility] with weak pulse. CXR shows possible PNA. Patient was recently admitted  8/3-8/10 for dehydration. Blood cultures reveals MSSA, 1 was a peripheral culture, 2nd was from port. Pharmacy consulted to dose vancomycin and zosyn for PNA in immunocompromised patient with orders 9/3 to narrow antibiotics to cefazolin for MSSA bactermia. TTE performed, TEE will not likely be possible d/t esophageal cancer.   8/31 >> Vancomycin >> 9/3 8/31 >> Zosyn >> 9/3 9/3 >> cefazolin  Tmax:  Afebrile WBCs: WNL Renal: SCr 1.5, trending up over last several days on vancomycin  8/31 blood x2: MSSA 9/2 blood x2: NGTD  Goal of Therapy:  Vancomycin trough level 15-20 mcg/ml Zosyn dose per renal function  Plan:  Cefazolin 2gm IV q8h  - if renal function worsens will need ot change to q12h dosing  - repeat blood cultures NGTD  - May need PAC removed   Doreene Eland, PharmD, BCPS.   Pager: [phone] 03/03/2014,11:36 AM

## 2014-03-03 NOTE — Telephone Encounter (Signed)
Called [facility] to cancel patient's appts; Loma Sousa for cancellation of today's appts, Katharine Look for cancellation of tomorrow's appts.    [name], RN, BSN, [place] [facility] [place] [phone]

## 2014-03-03 NOTE — Progress Notes (Addendum)
Visited patient at [facility] to check on his well being.  He indicated he was feeling better, "I went for my radiation today". We talked about his remaining 3 RTs and his pending DC.  In context of his continuing admission, I explained that I would cancel his [facility] appts for remainder of the week and reschedule as appropriate.  He verbalized understanding.  [name], RN, BSN, [place] [facility] [place] [phone]

## 2014-03-03 NOTE — Telephone Encounter (Signed)
Spoke with Suanne Marker CM and Suzanna LCSW re: Dabney's pending DC.  Explained that he has 3 RT remaining.  Suzanna indicated [facility] has skilled nursing available.  Requested that I be notified when/where his DC is finalized so that I can facilitate SCAT appts if needed.    [name], RN, BSN, [place] [facility] [place] [phone]

## 2014-03-03 NOTE — Progress Notes (Signed)
Clinical Social Work Department BRIEF PSYCHOSOCIAL ASSESSMENT 03/03/2014  Patient:  Michael Keith, Michael Keith     Account Number:  [id number]     Admit date:  02/28/2014  Clinical Social Worker:  Ulyess Blossom  Date/Time:  03/03/2014 04:16 PM  Referred by:  Physician  Date Referred:  03/03/2014 Referred for  SNF Placement   Other Referral:   Interview type:  Patient Other interview type:    PSYCHOSOCIAL DATA Living Status:  FACILITY Admitted from facility:  [facility] Level of care:  [place] Primary support name:  Shanon Brow Jackson/[phone] Primary support relationship to patient:  [place] Degree of support available:   adequate    CURRENT CONCERNS Current Concerns  Post-Acute Placement   Other Concerns:    SOCIAL WORK ASSESSMENT / PLAN CSW received referral that pt admitted from [facility].    CSW met with pt at bedside. CSW introduced self and explained role. Pt confirmed that he is a resident at [facility]. Pt shared that he transitioned to [facility] approximately August 20th. CSW provided support to pt as pt discussed that there have been "good days and bad days" at the facility. CSW discussed with pt that pt will need continued rehab once stable from the hospital and inquired with pt about if he wanted to return to [facility] or explore other options. Pt states the pt brother is helping him explore other options. CSW asked permission from pt to communicate with pt brother regarding placement and pt agreeable.    CSW to follow up with pt brother regarding the information that he has already gathered regarding alternative placement for pt and initiate SNF search to explore other options.    CSW completed FL2 and will initiate search for alternative options once this CSW speaks further with pt brother.    CSW to continue to follow to assist with pt disposition needs.   Assessment/plan status:   Psychosocial Support/Ongoing Assessment of Needs Other assessment/ plan:   discharge planning   Information/referral to community resources:   [facility] list    PATIENT'S/FAMILY'S RESPONSE TO PLAN OF CARE: Pt alert and oriented x 4. Pt has not been completely satisfied with the care at [facility] and has been communicating with pt brother about other options for facilities. CSW will reach out to pt brother to discuss further and assist. Pt appreciative of CSW visit and support.   Alison Murray, MSW, [place] [facility] [phone]

## 2014-03-03 NOTE — Progress Notes (Signed)
Chest vest and neb done. Pt had no adverse reaction to therapy.

## 2014-03-03 NOTE — Consult Note (Signed)
[facility] for Infectious Disease    Date of Admission:  02/28/2014   Total days of antibiotics 4               Reason for Consult: MSSA bacteremia and pneumonia    Referring Physician: Automatic consultation for staph aureus bacteremia   Principal Problem:   Staphylococcus aureus bacteremia with sepsis Active Problems:   Mitral regurgitation   Port-a-cath in place   HCAP (healthcare-associated pneumonia)   Nonischemic cardiomyopathy   COPD (chronic obstructive pulmonary disease)   CAD (coronary artery disease)   Chronic systolic CHF (congestive heart failure)   Squamous cell esophageal cancer   Severe protein-calorie malnutrition   Acute-on-chronic respiratory failure   Renal insufficiency   Normocytic anemia   Leukopenia   . sodium chloride   Intravenous Once  . aspirin  81 mg Per Tube Daily  . budesonide (PULMICORT) nebulizer solution  0.25 mg Nebulization BID  .  ceFAZolin (ANCEF) IV  2 g Intravenous 3 times per day  . feeding supplement (OSMOLITE 1.5 CAL)  1,000 mL Per Tube Q24H  . folic acid  1 mg Per Tube Daily  . heparin  5,000 Units Subcutaneous 3 times per day  . insulin aspart  0-15 Units Subcutaneous Q4H  . ipratropium  0.5 mg Nebulization Q6H  . levalbuterol  1.25 mg Nebulization Q6H  . methylPREDNISolone (SOLU-MEDROL) injection  40 mg Intravenous Q6H  . multivitamin with minerals  1 tablet Per Tube Daily  . nicotine  21 mg Transdermal Daily  . sodium chloride  3 mL Intravenous Q12H  . sterile water for irrigation  360 mL Irrigation 3 times per day  . sucralfate  1 g Per Tube TID  . terazosin  1 mg Per Tube QHS  . thiamine  100 mg Per Tube Daily    Recommendations: 1. I agree with Dr. Doristine Devoid management plan 2. Repeat blood cultures are pending   Assessment: He has MSSA bacteremia and pneumonia. His Port-A-Cath may be the source for his infection or could now be infected as a result of his infection. I would agree with removal of his  Port-A-Cath and placement of a temporary PICC once repeat blood cultures are negative. I agree that with his esophageal cancer transesophageal echocardiography should not be attempted.    HPI: Michael Keith is a 63 y.o. male with recently diagnosed, inoperable esophageal cancer who has been undergoing chemotherapy and radiation therapy. He recently started having fever, chills, productive and cough and shortness of breath leading to admission 4 days ago. His admission chest x-ray revealed a new right base infiltrate in both admission blood cultures have grown MSSA. Transthoracic echocardiography showed global hypokinesis that is much worse over the past 2 years. He has thickened mitral valve leaflets with severe mitral regurgitation and tricuspid regurgitation. No vegetations were seen. He's had a Port-A-Cath in place since July 22 for his chemotherapy. He states that he has noticed some increased soreness over the Port-A-Cath when he wears his shield during radiation treatments. He is feeling significantly better since admission.   Review of Systems: Constitutional: positive for anorexia, chills, fevers, malaise and weight loss Eyes: negative Ears, nose, mouth, throat, and face: negative Respiratory: positive for cough, dyspnea on exertion, pleurisy/chest pain and sputum, negative for hemoptysis and wheezing Cardiovascular: positive for chest pain and dyspnea Gastrointestinal: negative Genitourinary:negative  Past Medical History  Diagnosis Date  . Chronic pancreatitis     CT  scan shows improving pancreatitis  . Nonischemic cardiomyopathy 09/2009    Catheterization, April, 2011, questionable occlusion of the most apical portion of the LAD, LV dysfunction out of proportion to coronary disease  . Chronic systolic CHF (congestive heart failure)     April, 2011 mild troponin elevation at that time  . Mitral regurgitation     mild, echo April 2011  . Tobacco abuse   . Alcohol use     heavy until  Jan 2011, patient was counseled concerning alcohol in April 2011  . COPD (chronic obstructive pulmonary disease)   . Fluid overload 03/2010    October, 2011  . Cough Jan 2012    may be from enalapril, January, 2012  . Ejection fraction < 50%     EF 35-40%, echo, April, 2011  . CAD (coronary artery disease)     Catheterization, April, 2011, questionable occlusion of the most apical portion of the LAD, LV dysfunction out of proportion to coronary disease  . Squamous cell esophageal cancer   . Severe protein-calorie malnutrition 01/31/2014    History  Substance Use Topics  . Smoking status: Former Smoker -- 0.50 packs/day for 30 years    Types: Cigarettes    Quit date: 01/19/2014  . Smokeless tobacco: Never Used     Comment: smoking 3 cigarettes per day  . Alcohol Use: No     Comment: heavy untill Jan 2011    Family History  Problem Relation Age of Onset  . Diabetes Cousin   . Cancer Mother    No Known Allergies  OBJECTIVE: Blood pressure 123/77, pulse 93, temperature 97.7 F (36.5 C), temperature source Oral, resp. rate 22, height 5\' 6"  (1.676 m), weight 104 lb 11.5 oz (47.5 kg), SpO2 96.00%. General: He is thin and appears frail  Skin: No splinter or conjunctival hemorrhages  Lungs: Few crackles in right base  Cor: Very distant heart sounds  Chest: No obvious abnormalities of Port-A-Cath  Abdomen: Soft and nontender. PEG tube in place   Lab Results Lab Results  Component Value Date   WBC 6.4 03/03/2014   HGB 9.6* 03/03/2014   HCT 27.5* 03/03/2014   MCV 84.4 03/03/2014   PLT 157 03/03/2014    Lab Results  Component Value Date   CREATININE 1.50* 03/03/2014   BUN 45* 03/03/2014   NA 137 03/03/2014   K 3.3* 03/03/2014   CL 98 03/03/2014   CO2 21 03/03/2014    Lab Results  Component Value Date   ALT 34 03/01/2014   AST 65* 03/01/2014   ALKPHOS 109 03/01/2014   BILITOT 1.0 03/01/2014     Microbiology: Recent Results (from the past 240 hour(s))  CULTURE, BLOOD (ROUTINE X 2)     Status:  None   Collection Time    02/28/14  4:36 PM      Result Value Ref Range Status   Specimen Description BLOOD RIGHT HAND   Final   Special Requests BOTTLES DRAWN AEROBIC AND ANAEROBIC 3 ML   Final   Culture  Setup Time     Final   Value: 02/28/2014 18:52     Performed at [facility]   Culture     Final   Value: STAPHYLOCOCCUS AUREUS     Note: SUSCEPTIBILITIES PERFORMED ON PREVIOUS CULTURE WITHIN THE LAST 5 DAYS.     Note: Gram Stain Report Called to,Read Back By and Verified With:  LISA FREY @[id number]  03/01/14 SMIAS     Performed at [facility]  Report Status 03/03/2014 FINAL   Final  CULTURE, BLOOD (ROUTINE X 2)     Status: None   Collection Time    02/28/14  4:36 PM      Result Value Ref Range Status   Specimen Description BLOOD RIGHT PORT   Final   Special Requests BOTTLES DRAWN AEROBIC AND ANAEROBIC 5 ML   Final   Culture  Setup Time     Final   Value: 02/28/2014 18:53     Performed at [facility]   Culture     Final   Value: STAPHYLOCOCCUS AUREUS     Note: RIFAMPIN AND GENTAMICIN SHOULD NOT BE USED AS SINGLE DRUGS FOR TREATMENT OF STAPH INFECTIONS.     Note: Gram Stain Report Called to,Read Back By and Verified With:  Clydene Fake [phone] 03/01/14 SMIAS     Performed at [facility]   Report Status 03/03/2014 FINAL   Final   Organism ID, Bacteria STAPHYLOCOCCUS AUREUS   Final  MRSA PCR SCREENING     Status: None   Collection Time    02/28/14  9:33 PM      Result Value Ref Range Status   MRSA by PCR NEGATIVE  NEGATIVE Final   Comment:            The GeneXpert MRSA Assay (FDA     approved for NASAL specimens     only), is one component of a     comprehensive MRSA colonization     surveillance program. It is not     intended to diagnose MRSA     infection nor to guide or     monitor treatment for     MRSA infections.  CULTURE, BLOOD (ROUTINE X 2)     Status: None   Collection Time    03/02/14 10:30 AM      Result Value Ref Range Status    Specimen Description BLOOD PAC   Final   Special Requests BOTTLES DRAWN AEROBIC AND ANAEROBIC 10CC   Final   Culture  Setup Time     Final   Value: 03/02/2014 13:24     Performed at [facility]   Culture     Final   Value:        BLOOD CULTURE RECEIVED NO GROWTH TO DATE CULTURE WILL BE HELD FOR 5 DAYS BEFORE ISSUING A FINAL NEGATIVE REPORT     Performed at [facility]   Report Status PENDING   Incomplete  CULTURE, BLOOD (ROUTINE X 2)     Status: None   Collection Time    03/02/14 10:40 AM      Result Value Ref Range Status   Specimen Description BLOOD RIGHT HAND   Final   Special Requests BOTTLES DRAWN AEROBIC AND ANAEROBIC 6CC   Final   Culture  Setup Time     Final   Value: 03/02/2014 13:24     Performed at [facility]   Culture     Final   Value:        BLOOD CULTURE RECEIVED NO GROWTH TO DATE CULTURE WILL BE HELD FOR 5 DAYS BEFORE ISSUING A FINAL NEGATIVE REPORT     Performed at [facility]   Report Status PENDING   Incomplete   CHEST 2 VIEW    IMPRESSION:  1. Newly enlarged cardiopericardial silhouette with indistinct  pulmonary vasculature favoring pulmonary venous  hypertension/congestive heart failure. However, there is also  confluent airspace opacity in  the right lower lobe and potentially  right middle lobe which could reflect asymmetric edema or pneumonia.  2. Emphysema.     By: Sherryl Barters M.D.  On: 02/28/2014 17:44   Transthoracic echocardiogram   - Left ventricle: The cavity size was mildly dilated. Wall thickness was normal. The estimated ejection fraction was 15%. Severe global hypokinesis. The study is not technically sufficient to allow evaluation of LV diastolic function, however, there is likely significant diastolic dysfunction with high LV filling pressure. - Mitral valve: Mildly thickened leaflets . There was severe functional mitral regurgitation. - Left atrium: The atrium was mildly dilated. - Right  atrium: Moderately dilated. - Tricuspid valve: There was severe regurgitation. - Pulmonary arteries: PA peak pressure: 75 mm Hg (S). - Systemic veins: The IVC measures <2.1 cm, but does not collapse more than 50%, suggesting an RA pressure of 8 mmHg.  Impressions:  - Compared to the prior echo in 2013, the EF has now dropped to 15% from 30%, with signs of significantly elevated left and right heart filling pressures and severe functional MR and TR.  Lyman Bishop MD 2015-09-01T17:27:02    Michel Bickers, [place] [facility] [place] [facility] [phone] pager   [phone] cell 03/03/2014, 4:15 PM

## 2014-03-03 NOTE — Telephone Encounter (Signed)
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  She indicated he is doing well and expects to keep appt for today's RT.  She reported that it is unlikely he will be discharged tomorrow based on his current progress.  [name], RN, BSN, [place] [facility] [place] [phone]

## 2014-03-03 NOTE — Progress Notes (Signed)
PROGRESS NOTE  Michael Keith QPY:[id number] DOB: 1951/04/04 DOA: 02/28/2014 PCP: Philis Fendt, MD  Assessment/Plan: acute on chronic resp failure with hypoxia: multifactorial.  -Secondary to HCAP, COPD exacerbation  -remains in stepdown unit  -d/c IV lasix  -continue solumedrol, pulmicort and PRN nebulizer  -continue oxygen supplementation  -appreciate PCCM assistance  -normally on 2L at SNF  -wean steroids per PCCM  sepsis  -present at time of admission  - due to HCAP and bacteremia  -follow cx's sensitivity  Bacteremia-MSSA  -d/c zosyn -d/c vanco -start cefazolin  -surveillance blood culture--neg so far -may need removal of port-a-cath  -will not be able to perform TEE due to esophageal cancer elevated troponin:  -appears to be demand ischemia in nature.  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 Alen Blew Code Status: Full  Family Communication: no family at bedside  Disposition Plan: to be determine   Antibiotics:  Zosyn 02/28/14>>>03/03/14  vanco 02/28/14>>>03/03/14  Cefazolin 03/03/14>>>    Procedures/Studies: Dg Chest 2 View  02/28/2014   CLINICAL DATA:  Fever.  Shortness of breath.  Chest pain.  EXAM: CHEST  2 VIEW  COMPARISON:  01/31/2014  FINDINGS: The patient is rotated to the Right on today's radiograph, reducing diagnostic sensitivity and specificity.  Abnormal airspace opacities observed in the right mid lung and right lung base. Indistinct pulmonary vasculature with cardiopericardial silhouette enlarged compared to the prior exam.  Power injectable Port-A-Cath tip:  Lower SVC.  Emphysema noted.  No pleural effusion identified.  IMPRESSION: 1. Newly enlarged cardiopericardial silhouette with indistinct pulmonary vasculature favoring pulmonary venous hypertension/congestive heart failure. However, there is also confluent airspace opacity in the right lower lobe and potentially right middle lobe which could reflect asymmetric edema or pneumonia. 2. Emphysema.   Electronically Signed   By: Sherryl Barters M.D.   On: 02/28/2014 17:44   Dg Chest Port 1 View  03/02/2014   CLINICAL DATA:  Shortness of breath, infiltrate.  EXAM: PORTABLE CHEST - 1 VIEW  COMPARISON:  03/01/2014  FINDINGS: Degraded by rotation. Prominent cardiomediastinal contours. Aortic tortuosity. Right chest wall Port-A-Cath with tip projecting over the mid SVC. Mild peripheral opacity right lower lung, improving. Mild increased left infrahilar opacity. Background interstitial prominence/ COPD. Small right pleural effusion. No pneumothorax. Osteopenia.  IMPRESSION: Improving aeration along the periphery of the right lung. Mild residual right lung base opacity and mild increased left infrahilar opacity; atelectasis versus infiltrate.  Small right pleural effusion.  Interstitial prominence/COPD.  Mild interstitial edema not excluded.   Electronically Signed   By: Carlos Levering M.D.   On: 03/02/2014 05:57   Dg Chest Port 1 View  03/01/2014   CLINICAL DATA:  Shortness of breath.  EXAM: PORTABLE CHEST - 1 VIEW  COMPARISON:  March 09, 2014.  FINDINGS: Stable cardiomediastinal silhouette. Right internal jugular Port-A-Cath is unchanged in position. No pneumothorax  or significant pleural effusion is noted. Stable airspace opacity is noted laterally in the right midlung concerning for pneumonia or  subsegmental atelectasis. Stable central pulmonary vascular congestion is noted as well as probable bilateral pulmonary edema.  IMPRESSION: Stable central pulmonary vascular congestion and pulmonary edema is noted. Stable opacity is noted laterally in right midlung consistent with atelectasis or pneumonia.   Electronically Signed   By: Sabino Dick M.D.   On: 03/01/2014 08:25   Dg Chest Port 1 View  03/01/2014   CLINICAL DATA:  Pneumonia.  EXAM: PORTABLE CHEST - 1 VIEW  COMPARISON:  PA and lateral chest 02/28/2014 and 01/31/2014. PET CT scan 12/21/2013.  FINDINGS: Port-A-Cath remains in place. The lungs are emphysematous. Airspace opacity in the right mid lung zone persists but appears mildly improved. Cardiomegaly and pulmonary vascular congestion are again seen. PEG tube is noted.  IMPRESSION: Airspace opacity right mid lung zone likely due to pneumonia appears mildly improved.  Cardiomegaly and vascular congestion.  Emphysema.   Electronically Signed   By: Inge Rise M.D.   On: 03/01/2014 07:27   Dg Abd 2 Views  02/04/2014   CLINICAL DATA:  Nausea and vomiting  EXAM: ABDOMEN - 2 VIEW  COMPARISON:  None.  FINDINGS: Gastrostomy tube is in place. No free intraperitoneal gas on the decubitus image. Diffuse colonic distension is noted. Phleboliths project over the pelvis.  IMPRESSION: Diffuse colonic distention.  No free intraperitoneal gas.   Electronically Signed   By: Maryclare Bean M.D.   On: 02/04/2014 11:12         Subjective: Patient still complains of some shortness of breath but his breathing much better than at the time of admission. Denies any fevers, chills, chest pain, nausea, vomiting, diarrhea, abdominal pain, dysuria  Objective: Filed Vitals:   03/03/14 0126 03/03/14 0400 03/03/14 0838 03/03/14 0929  BP:  109/70  108/49  Pulse:  87  122  Temp:  97.8 F (36.6 C) 97.7 F (36.5 C)   TempSrc:  Oral Oral   Resp:  19    Height:      Weight:      SpO2: 97% 96% 96% 100%     Intake/Output Summary (Last 24 hours) at 03/03/14 1031 Last data filed at 03/03/14 0600  Gross per 24 hour  Intake   2320 ml  Output    575 ml  Net   1745 ml   Weight change:  Exam:   General:  Pt is alert, follows commands appropriately, not in acute distress  HEENT: No icterus, No thrush,  [place]/AT  Cardiovascular: RRR, S1/S2, no rubs, no gallops  Respiratory: Bibasilar crackles, right greater than left. No wheezing.  Abdomen: Soft/+BS, non tender, non distended, no guarding  Extremities: No edema, No lymphangitis, No petechiae, No rashes, no synovitis  Data Reviewed: Basic Metabolic Panel:  Recent Labs Lab 02/27/14 1620 02/28/14 1636 02/28/14 1918 03/01/14 0630 03/02/14 0545 03/03/14 0555  NA 136* 134*  --  135* 135* 137  K 4.8 4.4  --  4.2 3.9 3.3*  CL 101 96  --  100 99 98  CO2 24 21  --  21 21 21   GLUCOSE 127* 174*  --  154* 466* 344*  BUN 25* 27*  --  27* 36* 45*  CREATININE 1.01 1.15 1.17 1.17 1.41* 1.50*  CALCIUM 9.1 9.1  --  8.3* 8.4 8.9   Liver Function Tests:  Recent Labs Lab 02/27/14 1620 02/28/14 1636 03/01/14 0630  AST 18 22  65*  ALT 10 11 34  ALKPHOS 68 71 109  BILITOT 0.4 0.6 1.0  PROT 7.2 7.7 7.3  ALBUMIN 3.0* 3.1* 2.6*   No results found for this basename: LIPASE, AMYLASE,  in the last 168 hours No results found for this basename: AMMONIA,  in the last 168 hours CBC:  Recent Labs Lab 02/27/14 1620 02/28/14 1636 02/28/14 1918 03/01/14 0630 03/03/14 0555  WBC 2.7* 6.4 6.4 7.7 6.4  NEUTROABS 1.6* 5.5  --   --   --   HGB 7.5* 7.4* 6.7* 10.3* 9.6*  HCT 22.8* 22.2* 19.7* 30.8* 27.5*  MCV 89.8 89.2 88.7 86.5 84.4  PLT 185 185 167 159 157   Cardiac Enzymes:  Recent Labs Lab 02/28/14 1918 03/01/14 0630 03/01/14 1218 03/02/14 1030 03/02/14 1556  TROPONINI 0.55* 0.38* 0.64* <0.30 <0.30   BNP: No components found with this basename: POCBNP,  CBG:  Recent Labs Lab 03/02/14 1624 03/02/14 1936 03/02/14 2349  03/03/14 0408 03/03/14 0805  GLUCAP 143* 252* 241* 332* 301*    Recent Results (from the past 240 hour(s))  CULTURE, BLOOD (ROUTINE X 2)     Status: None   Collection Time    02/28/14  4:36 PM      Result Value Ref Range Status   Specimen Description BLOOD RIGHT HAND   Final   Special Requests BOTTLES DRAWN AEROBIC AND ANAEROBIC 3 ML   Final   Culture  Setup Time     Final   Value: 02/28/2014 18:52     Performed at [facility]   Culture     Final   Value: STAPHYLOCOCCUS AUREUS     Note: SUSCEPTIBILITIES PERFORMED ON PREVIOUS CULTURE WITHIN THE LAST 5 DAYS.     Note: Gram Stain Report Called to,Read Back By and Verified With:  LISA FREY @[id number]  03/01/14 SMIAS     Performed at [facility]   Report Status 03/03/2014 FINAL   Final  CULTURE, BLOOD (ROUTINE X 2)     Status: None   Collection Time    02/28/14  4:36 PM      Result Value Ref Range Status   Specimen Description BLOOD RIGHT PORT   Final   Special Requests BOTTLES DRAWN AEROBIC AND ANAEROBIC 5 ML   Final   Culture  Setup Time     Final   Value: 02/28/2014 18:53     Performed at [facility]   Culture     Final   Value: STAPHYLOCOCCUS AUREUS     Note: RIFAMPIN AND GENTAMICIN SHOULD NOT BE USED AS SINGLE DRUGS FOR TREATMENT OF STAPH INFECTIONS.     Note: Gram Stain Report Called to,Read Back By and Verified With:  Clydene Fake [phone] 03/01/14 SMIAS     Performed at [facility]   Report Status 03/03/2014 FINAL   Final   Organism ID, Bacteria STAPHYLOCOCCUS AUREUS   Final  MRSA PCR SCREENING     Status: None   Collection Time    02/28/14  9:33 PM      Result Value Ref Range Status   MRSA by PCR NEGATIVE  NEGATIVE Final   Comment:            The GeneXpert MRSA Assay (FDA     approved for NASAL specimens     only), is one component of a     comprehensive MRSA colonization     surveillance program. It is not     intended to diagnose  MRSA     infection nor to guide or     monitor  treatment for     MRSA infections.  CULTURE, BLOOD (ROUTINE X 2)     Status: None   Collection Time    03/02/14 10:30 AM      Result Value Ref Range Status   Specimen Description BLOOD PAC   Final   Special Requests BOTTLES DRAWN AEROBIC AND ANAEROBIC 10CC   Final   Culture  Setup Time     Final   Value: 03/02/2014 13:24     Performed at [facility]   Culture     Final   Value:        BLOOD CULTURE RECEIVED NO GROWTH TO DATE CULTURE WILL BE HELD FOR 5 DAYS BEFORE ISSUING A FINAL NEGATIVE REPORT     Performed at [facility]   Report Status PENDING   Incomplete  CULTURE, BLOOD (ROUTINE X 2)     Status: None   Collection Time    03/02/14 10:40 AM      Result Value Ref Range Status   Specimen Description BLOOD RIGHT HAND   Final   Special Requests BOTTLES DRAWN AEROBIC AND ANAEROBIC 6CC   Final   Culture  Setup Time     Final   Value: 03/02/2014 13:24     Performed at [facility]   Culture     Final   Value:        BLOOD CULTURE RECEIVED NO GROWTH TO DATE CULTURE WILL BE HELD FOR 5 DAYS BEFORE ISSUING A FINAL NEGATIVE REPORT     Performed at [facility]   Report Status PENDING   Incomplete     Scheduled Meds: . sodium chloride   Intravenous Once  . aspirin  81 mg Per Tube Daily  . budesonide (PULMICORT) nebulizer solution  0.25 mg Nebulization BID  . feeding supplement (OSMOLITE 1.5 CAL)  1,000 mL Per Tube Q24H  . folic acid  1 mg Per Tube Daily  . heparin  5,000 Units Subcutaneous 3 times per day  . insulin aspart  0-15 Units Subcutaneous Q4H  . ipratropium  0.5 mg Nebulization Q6H  . levalbuterol  1.25 mg Nebulization Q6H  . methylPREDNISolone (SOLU-MEDROL) injection  40 mg Intravenous Q6H  . multivitamin with minerals  1 tablet Per Tube Daily  . nicotine  21 mg Transdermal Daily  . piperacillin-tazobactam (ZOSYN)  IV  3.375 g Intravenous Q8H  . sodium chloride  3 mL Intravenous Q12H  . sterile water for irrigation  360 mL Irrigation 3  times per day  . sucralfate  1 g Per Tube TID  . terazosin  1 mg Per Tube QHS  . thiamine  100 mg Per Tube Daily  . vancomycin  1,000 mg Intravenous Q24H   Continuous Infusions:    Maureen Delatte, DO  [facility] Pager [phone]  If 7PM-7AM, please contact night-coverage [email or web address] Password [email or web address] 03/03/2014, 10:31 AM   LOS: 3 days

## 2014-03-03 NOTE — Progress Notes (Signed)
TELEMETRY: Reviewed telemetry pt in NSR with frequent PVCs and runs of NSVT. 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 He has had 26/30 RTX treatments and om 8/31 was sent from RTX oncology to [facility] ED due to tachycardic, hypotension and FTT. Chest Xray was suggestive of Rt pna(suspect aspiration) and he continued to need increased FIO2. He was given PRBC for hgb of 6.7 but had a decline in blood pressure. Troponins mildly elevated. Echo shows decline in EF to 15%.  Patient reports he feels all right. No chest pain, dyspnea, or cough.  . sodium chloride   Intravenous Once  . aspirin EC  81 mg Oral q morning - 10a  . budesonide (PULMICORT) nebulizer solution  0.25 mg Nebulization BID  . feeding supplement (OSMOLITE 1.5 CAL)  1,000 mL Per Tube Q24H  . folic acid  1 mg Per Tube Daily  . heparin  5,000 Units Subcutaneous 3 times per day  . insulin aspart  0-9 Units Subcutaneous Q4H  . ipratropium  0.5 mg Nebulization Q6H  . levalbuterol  1.25 mg Nebulization Q6H  . methylPREDNISolone (SOLU-MEDROL) injection  40 mg Intravenous Q6H  . multivitamin with minerals  1 tablet Per Tube Daily  . nicotine  21 mg Transdermal Daily  . piperacillin-tazobactam (ZOSYN)  IV  3.375 g Intravenous Q8H  . sodium chloride  3 mL Intravenous Q12H  . sterile water for irrigation  360 mL Irrigation 3 times  per day  . sucralfate  1 g Per Tube TID  . terazosin  1 mg Per Tube QHS  . thiamine  100 mg Per Tube Daily  . vancomycin  1,000 mg Intravenous Q24H      LABS: Basic Metabolic Panel:  Recent Labs  03/02/14 0545 03/03/14 0555  NA 135* 137  K 3.9 3.3*  CL 99 98  CO2 21 21  GLUCOSE 466* 344*  BUN 36* 45*  CREATININE 1.41* 1.50*  CALCIUM 8.4 8.9   Liver Function Tests:  Recent Labs  02/28/14 1636 03/01/14 0630  AST 22 65*  ALT 11 34  ALKPHOS 71 109  BILITOT 0.6 1.0  PROT 7.7 7.3  ALBUMIN 3.1* 2.6*   No results found for this basename: LIPASE, AMYLASE,  in the last 72 hours CBC:  Recent Labs  02/28/14 1636  03/01/14 0630 03/03/14 0555  WBC 6.4  < > 7.7 6.4  NEUTROABS 5.5  --   --   --   HGB 7.4*  < > 10.3* 9.6*  HCT 22.2*  < > 30.8* 27.5*  MCV 89.2  < > 86.5 84.4  PLT 185  < > 159 157  < > =  values in this interval not displayed. Cardiac Enzymes:  Recent Labs  03/01/14 1218 03/02/14 1030 03/02/14 1556  TROPONINI 0.Michael* <0.30 <0.30   BNP:  Recent Labs  02/28/14 1636  PROBNP 29729.0*   D-Dimer: No results found for this basename: DDIMER,  in the last 72 hours Hemoglobin A1C:  Recent Labs  02/28/14 1918  HGBA1C 6.8*   Fasting Lipid Panel: No results found for this basename: CHOL, HDL, LDLCALC, TRIG, CHOLHDL, LDLDIRECT,  in the last 72 hours Thyroid Function Tests:  Recent Labs  02/28/14 1918  TSH 0.554     Radiology/Studies:  Dg Chest Port 1 View  03/02/2014   CLINICAL DATA:  Shortness of breath, infiltrate.  EXAM: PORTABLE CHEST - 1 VIEW  COMPARISON:  03/01/2014  FINDINGS: Degraded by rotation. Prominent cardiomediastinal contours. Aortic tortuosity. Right chest wall Port-A-Cath with tip projecting over the mid SVC. Mild peripheral opacity right lower lung, improving. Mild increased left infrahilar opacity. Background interstitial prominence/ COPD. Small right pleural effusion. No pneumothorax. Osteopenia.  IMPRESSION: Improving aeration  along the periphery of the right lung. Mild residual right lung base opacity and mild increased left infrahilar opacity; atelectasis versus infiltrate.  Small right pleural effusion.  Interstitial prominence/COPD.  Mild interstitial edema not excluded.   Electronically Signed   By: Michael Keith M.D.   On: 03/02/2014 05:57   Dg Chest Port 1 View  03/01/2014   CLINICAL DATA:  Shortness of breath.  EXAM: PORTABLE CHEST - 1 VIEW  COMPARISON:  March 09, 2014.  FINDINGS: Stable cardiomediastinal silhouette. Right internal jugular Port-A-Cath is unchanged in position. No pneumothorax or significant pleural effusion is noted. Stable airspace opacity is noted laterally in the right midlung concerning for pneumonia or subsegmental atelectasis. Stable central pulmonary vascular congestion is noted as well as probable bilateral pulmonary edema.  IMPRESSION: Stable central pulmonary vascular congestion and pulmonary edema is noted. Stable opacity is noted laterally in right midlung consistent with atelectasis or pneumonia.   Electronically Signed   By: Sabino Dick M.D.   On: 03/01/2014 08:25    PHYSICAL EXAM General: Chronically ill appearing BM, thin Head: Normocephalic, atraumatic, sclera non-icteric, oropharynx is clear Neck: Negative for carotid bruits. JVD not elevated. No adenopathy Lungs: Few rhonchi. Breathing is unlabored. Lying supine. Heart: RRR S1 S2 without murmurs, rubs, or gallops.  Abdomen: Soft, non-tender, non-distended with normoactive bowel sounds. PEG tube in place. Extremities: No clubbing, cyanosis or edema.  Distal pedal pulses are 2+ and equal bilaterally. Neuro: Alert and oriented X 3. Moves all extremities spontaneously. Psych:  Responds to questions appropriately with a normal affect.  ASSESSMENT AND PLAN: 1. Hypoxic respiratory failure. Improved on [place] oxygen. In no distress.  2. Severe nonischemic cardiomyopathy. EF 15%. Appears to be euvolemic at this time. Low BP limits  use of ACEi or beta blocker. Options very limited. 3. Elevated troponin. Mild. Due to multiple stressors. Not a candidate for further evaluation. 4. Aspiration PNA- on IV antibiotics. 5. Esophageal CA 6. NSVT. Maintain potassium >4.0, magnesium > 2.0. Consider beta blocker when BP allows.  7. Anemia of chronic disease. Improved post transfusion.  Present on Admission:  . Healthcare-associated pneumonia . COPD (chronic obstructive pulmonary disease) . Chronic systolic CHF (congestive heart failure) . Cancer of upper third of esophagus . Dehydration . Pneumonia . HCAP (healthcare-associated pneumonia)  Signed, Peter Martin[place], [place] 03/03/2014 7:46 AM

## 2014-03-04 ENCOUNTER — Ambulatory Visit
Admission: RE | Admit: 2014-03-04 | Discharge: 2014-03-04 | Disposition: A | Discharge status: Home / Self Care | Payer: Medicare Other | Source: Ambulatory Visit | Attending: Radiation Oncology | Admitting: Radiation Oncology

## 2014-03-04 ENCOUNTER — Telehealth: Payer: Self-pay | Admitting: *Deleted

## 2014-03-04 ENCOUNTER — Ambulatory Visit: Payer: Medicare Other

## 2014-03-04 ENCOUNTER — Inpatient Hospital Stay (HOSPITAL_COMMUNITY): Payer: Medicare Other

## 2014-03-04 DIAGNOSIS — E46 Unspecified protein-calorie malnutrition: Secondary | ICD-10-CM

## 2014-03-04 LAB — CBC
HCT: 28.3 % — ABNORMAL LOW (ref 39.0–52.0)
Hemoglobin: 9.6 g/dL — ABNORMAL LOW (ref 13.0–17.0)
MCH: 29.4 pg (ref 26.0–34.0)
MCHC: 33.9 g/dL (ref 30.0–36.0)
MCV: 86.5 fL (ref 78.0–100.0)
Platelets: 148 10*3/uL — ABNORMAL LOW (ref 150–400)
RBC: 3.27 MIL/uL — ABNORMAL LOW (ref 4.22–5.81)
RDW: 15.9 % — AB (ref 11.5–15.5)
WBC: 5.2 10*3/uL (ref 4.0–10.5)

## 2014-03-04 LAB — BASIC METABOLIC PANEL
ANION GAP: 17 — AB (ref 5–15)
BUN: 41 mg/dL — ABNORMAL HIGH (ref 6–23)
CALCIUM: 8.9 mg/dL (ref 8.4–10.5)
CO2: 21 mEq/L (ref 19–32)
CREATININE: 1.28 mg/dL (ref 0.50–1.35)
Chloride: 97 mEq/L (ref 96–112)
GFR calc non Af Amer: 58 mL/min — ABNORMAL LOW (ref >90)
GFR, EST AFRICAN AMERICAN: 67 mL/min — AB (ref >90)
Glucose, Bld: 357 mg/dL — ABNORMAL HIGH (ref 70–99)
Potassium: 3.6 mEq/L — ABNORMAL LOW (ref 3.7–5.3)
SODIUM: 135 meq/L — AB (ref 137–147)

## 2014-03-04 LAB — PROTIME-INR
INR: 1.17 (ref 0.00–1.49)
Prothrombin Time: 14.9 seconds (ref 11.6–15.2)

## 2014-03-04 LAB — GLUCOSE, CAPILLARY
GLUCOSE-CAPILLARY: 117 mg/dL — AB (ref 70–99)
GLUCOSE-CAPILLARY: 170 mg/dL — AB (ref 70–99)
GLUCOSE-CAPILLARY: 227 mg/dL — AB (ref 70–99)
GLUCOSE-CAPILLARY: 275 mg/dL — AB (ref 70–99)
Glucose-Capillary: 304 mg/dL — ABNORMAL HIGH (ref 70–99)
Glucose-Capillary: 375 mg/dL — ABNORMAL HIGH (ref 70–99)
Glucose-Capillary: 238 mg/dL — ABNORMAL HIGH (ref 70–99)

## 2014-03-04 LAB — APTT: APTT: 46 s — AB (ref 24–37)

## 2014-03-04 MED ORDER — FENTANYL CITRATE 0.05 MG/ML IJ SOLN
INTRAMUSCULAR | Status: AC | PRN
  Administered 2014-03-04: 50 ug via INTRAVENOUS

## 2014-03-04 MED ORDER — OSMOLITE 1.5 CAL PO LIQD
1000.0000 mL | ORAL | Status: DC
  Administered 2014-03-04 – 2014-03-07 (×4): 1000 mL
  Filled 2014-03-04 (×6): qty 1000

## 2014-03-04 MED ORDER — FENTANYL CITRATE 0.05 MG/ML IJ SOLN
INTRAMUSCULAR | Status: AC
  Filled 2014-03-04: qty 2

## 2014-03-04 MED ORDER — LEVALBUTEROL HCL 1.25 MG/0.5ML IN NEBU
1.2500 mg | INHALATION_SOLUTION | Freq: Four times a day (QID) | RESPIRATORY_TRACT | Status: DC
  Administered 2014-03-04 – 2014-03-07 (×12): 1.25 mg via RESPIRATORY_TRACT
  Filled 2014-03-04 (×16): qty 0.5

## 2014-03-04 MED ORDER — METHYLPREDNISOLONE SODIUM SUCC 40 MG IJ SOLR
40.0000 mg | Freq: Every day | INTRAMUSCULAR | Status: DC
  Administered 2014-03-05: 40 mg via INTRAVENOUS
  Filled 2014-03-04: qty 1

## 2014-03-04 MED ORDER — IPRATROPIUM BROMIDE 0.02 % IN SOLN
0.5000 mg | Freq: Four times a day (QID) | RESPIRATORY_TRACT | Status: DC
  Administered 2014-03-04 – 2014-03-07 (×12): 0.5 mg via RESPIRATORY_TRACT
  Filled 2014-03-04 (×15): qty 2.5

## 2014-03-04 MED ORDER — MIDAZOLAM HCL 2 MG/2ML IJ SOLN
INTRAMUSCULAR | Status: AC | PRN
  Administered 2014-03-04: 1 mg via INTRAVENOUS

## 2014-03-04 MED ORDER — MIDAZOLAM HCL 2 MG/2ML IJ SOLN
INTRAMUSCULAR | Status: AC
  Filled 2014-03-04: qty 2

## 2014-03-04 NOTE — Procedures (Signed)
Port removal requested due to bacteremia.  The external port site looked healthy.  The port was easily removed.  The port catheter had a thin yellow film around it, otherwise, no signs of pocket infection.  The port was sent for culture.  The pocket was irrigated with saline.  The pocket was closed with interrupted Ethilon suture.  Will follow up on incision and plan to remove sutures in 10-14 days.  No immediate complication.

## 2014-03-04 NOTE — Progress Notes (Signed)
Patient ID: Michael Keith, male   DOB: 1951-01-20, 63 y.o.   MRN: [id number] Request received from ID service for port a cath removal/poss PICC placement in pt with history of MSSA bacteremia. Pt had port placed 01/19/14 for chemotherapy secondary to esophageal cancer.He recently started having fever, chills, productive  cough and shortness of breath leading to admission 4 days ago. His admission chest x-ray revealed a new right base infiltrate and both admission blood cultures have grown MSSA. Follow up  03/02/14 blood cx's are pending at this time. Additional PMH as below. Exam: pt awake/alert; chest- dim BS bases; clean , intact rt chest wall PAC with minimal tenderness, no fluctuance or drainage, currently accessed and infusing medications; heart- occ ectopy noted; abd- soft,+BS, G tube intact but will small amt drainage at insertion site; ext- no edema.   Filed Vitals:   03/04/14 0400 03/04/14 0415 03/04/14 0600 03/04/14 0805  BP: 134/81 134/76 100/58   Pulse: 79 83    Temp: 98.2 F (36.8 C)   98.2 F (36.8 C)  TempSrc: Oral   Oral  Resp: 18 19    Height:      Weight:      SpO2: 96% 95%  98%   Past Medical History  Diagnosis Date  . Chronic pancreatitis     CT scan shows improving pancreatitis  . Nonischemic cardiomyopathy 09/2009    Catheterization, April, 2011, questionable occlusion of the most apical portion of the LAD, LV dysfunction out of proportion to coronary disease  . Chronic systolic CHF (congestive heart failure)     April, 2011 mild troponin elevation at that time  . Mitral regurgitation     mild, echo April 2011  . Tobacco abuse   . Alcohol use     heavy until Jan 2011, patient was counseled concerning alcohol in April 2011  . COPD (chronic obstructive pulmonary disease)   . Fluid overload 03/2010    October, 2011  . Cough Jan 2012    may be from enalapril, January, 2012  . Ejection fraction < 50%     EF 35-40%, echo, April, 2011  . CAD (coronary artery disease)      Catheterization, April, 2011, questionable occlusion of the most apical portion of the LAD, LV dysfunction out of proportion to coronary disease  . Squamous cell esophageal cancer   . Severe protein-calorie malnutrition 01/31/2014   Past Surgical History  Procedure Laterality Date  . Cardiac catheterization  April 2011    questionable occlusion of the most apical portion of the LAD / LV dysfunction out of proportion to coronary disease  . Wrist surgey      left wrist  . Laparoscopic gastrostomy N/A 02/02/2014    Procedure: LAPAROSCOPIC GASTROSTOMY TUBE PLACEMENT;  Surgeon: Pedro Earls, MD;  Location: [facility];  Service: General;  Laterality: N/A;   Dg Chest 2 View  02/28/2014   CLINICAL DATA:  Fever.  Shortness of breath.  Chest pain.  EXAM: CHEST  2 VIEW  COMPARISON:  01/31/2014  FINDINGS: The patient is rotated to the Right on today's radiograph, reducing diagnostic sensitivity and specificity. Abnormal airspace opacities observed in the right mid lung and right lung base. Indistinct pulmonary vasculature with cardiopericardial silhouette enlarged compared to the prior exam.  Power injectable Port-A-Cath tip:  Lower SVC.  Emphysema noted.  No pleural effusion identified.  IMPRESSION: 1. Newly enlarged cardiopericardial silhouette with indistinct pulmonary vasculature favoring pulmonary venous hypertension/congestive heart failure. However, there is also confluent  airspace opacity in the right lower lobe and potentially right middle lobe which could reflect asymmetric edema or pneumonia. 2. Emphysema.   Electronically Signed   By: Sherryl Barters M.D.   On: 02/28/2014 17:44   Dg Chest Port 1 View  03/02/2014   CLINICAL DATA:  Shortness of breath, infiltrate.  EXAM: PORTABLE CHEST - 1 VIEW  COMPARISON:  03/01/2014  FINDINGS: Degraded by rotation. Prominent cardiomediastinal contours. Aortic tortuosity. Right chest wall Port-A-Cath with tip projecting over the mid SVC. Mild peripheral opacity right  lower lung, improving. Mild increased left infrahilar opacity. Background interstitial prominence/ COPD. Small right pleural effusion. No pneumothorax. Osteopenia.  IMPRESSION: Improving aeration along the periphery of the right lung. Mild residual right lung base opacity and mild increased left infrahilar opacity; atelectasis versus infiltrate.  Small right pleural effusion.  Interstitial prominence/COPD.  Mild interstitial edema not excluded.   Electronically Signed   By: Carlos Levering M.D.   On: 03/02/2014 05:57   Dg Chest Port 1 View  03/01/2014   CLINICAL DATA:  Shortness of breath.  EXAM: PORTABLE CHEST - 1 VIEW  COMPARISON:  March 09, 2014.  FINDINGS: Stable cardiomediastinal silhouette. Right internal jugular Port-A-Cath is unchanged in position. No pneumothorax or significant pleural effusion is noted. Stable airspace opacity is noted laterally in the right midlung concerning for pneumonia or subsegmental atelectasis. Stable central pulmonary vascular congestion is noted as well as probable bilateral pulmonary edema.  IMPRESSION: Stable central pulmonary vascular congestion and pulmonary edema is noted. Stable opacity is noted laterally in right midlung consistent with atelectasis or pneumonia.   Electronically Signed   By: Sabino Dick M.D.   On: 03/01/2014 08:25   Dg Chest Port 1 View  03/01/2014   CLINICAL DATA:  Pneumonia.  EXAM: PORTABLE CHEST - 1 VIEW  COMPARISON:  PA and lateral chest 02/28/2014 and 01/31/2014. PET CT scan 12/21/2013.  FINDINGS: Port-A-Cath remains in place. The lungs are emphysematous. Airspace opacity in the right mid lung zone persists but appears mildly improved. Cardiomegaly and pulmonary vascular congestion are again seen. PEG tube is noted.  IMPRESSION: Airspace opacity right mid lung zone likely due to pneumonia appears mildly improved.  Cardiomegaly and vascular congestion.  Emphysema.   Electronically Signed   By: Inge Rise M.D.   On: 03/01/2014 07:27    Dg Abd 2 Views  02/04/2014   CLINICAL DATA:  Nausea and vomiting  EXAM: ABDOMEN - 2 VIEW  COMPARISON:  None.  FINDINGS: Gastrostomy tube is in place. No free intraperitoneal gas on the decubitus image. Diffuse colonic distension is noted. Phleboliths project over the pelvis.  IMPRESSION: Diffuse colonic distention.  No free intraperitoneal gas.   Electronically Signed   By: Maryclare Bean M.D.   On: 02/04/2014 11:12  Results for orders placed during the hospital encounter of 02/28/14  CULTURE, BLOOD (ROUTINE X 2)      Result Value Ref Range   Specimen Description BLOOD RIGHT HAND     Special Requests BOTTLES DRAWN AEROBIC AND ANAEROBIC 3 ML     Culture  Setup Time       Value: 02/28/2014 18:52     Performed at [facility]   Culture       Value: STAPHYLOCOCCUS AUREUS     Note: SUSCEPTIBILITIES PERFORMED ON PREVIOUS CULTURE WITHIN THE LAST 5 DAYS.     Note: Gram Stain Report Called to,Read Back By and Verified With:  LISA FREY @[id number]  03/01/14 SMIAS  Performed at [facility]   Report Status 03/03/2014 FINAL    CULTURE, BLOOD (ROUTINE X 2)      Result Value Ref Range   Specimen Description BLOOD RIGHT PORT     Special Requests BOTTLES DRAWN AEROBIC AND ANAEROBIC 5 ML     Culture  Setup Time       Value: 02/28/2014 18:53     Performed at [facility]   Culture       Value: STAPHYLOCOCCUS AUREUS     Note: RIFAMPIN AND GENTAMICIN SHOULD NOT BE USED AS SINGLE DRUGS FOR TREATMENT OF STAPH INFECTIONS.     Note: Gram Stain Report Called to,Read Back By and Verified With:  Clydene Fake [phone] 03/01/14 SMIAS     Performed at [facility]   Report Status 03/03/2014 FINAL     Organism ID, Bacteria STAPHYLOCOCCUS AUREUS    MRSA PCR SCREENING      Result Value Ref Range   MRSA by PCR NEGATIVE  NEGATIVE  CULTURE, BLOOD (ROUTINE X 2)      Result Value Ref Range   Specimen Description BLOOD RIGHT HAND     Special Requests BOTTLES DRAWN AEROBIC AND ANAEROBIC 6CC      Culture  Setup Time       Value: 03/02/2014 13:24     Performed at [facility]   Culture       Value:        BLOOD CULTURE RECEIVED NO GROWTH TO DATE CULTURE WILL BE HELD FOR 5 DAYS BEFORE ISSUING A FINAL NEGATIVE REPORT     Performed at [facility]   Report Status PENDING    CULTURE, BLOOD (ROUTINE X 2)      Result Value Ref Range   Specimen Description BLOOD PAC     Special Requests BOTTLES DRAWN AEROBIC AND ANAEROBIC 10CC     Culture  Setup Time       Value: 03/02/2014 13:24     Performed at [facility]   Culture       Value:        BLOOD CULTURE RECEIVED NO GROWTH TO DATE CULTURE WILL BE HELD FOR 5 DAYS BEFORE ISSUING A FINAL NEGATIVE REPORT     Performed at [facility]   Report Status PENDING    CBC WITH DIFFERENTIAL      Result Value Ref Range   WBC 6.4  4.0 - 10.5 K/uL   RBC 2.49 (*) 4.22 - 5.81 MIL/uL   Hemoglobin 7.4 (*) 13.0 - 17.0 g/dL   HCT 22.2 (*) 39.0 - 52.0 %   MCV 89.2  78.0 - 100.0 fL   MCH 29.7  26.0 - 34.0 pg   MCHC 33.3  30.0 - 36.0 g/dL   RDW 15.4  11.5 - 15.5 %   Platelets 185  150 - 400 K/uL   Neutrophils Relative % 85 (*) 43 - 77 %   Lymphocytes Relative 5 (*) 12 - 46 %   Monocytes Relative 10  3 - 12 %   Eosinophils Relative 0  0 - 5 %   Basophils Relative 0  0 - 1 %   Neutro Abs 5.5  1.7 - 7.7 K/uL   Lymphs Abs 0.3 (*) 0.7 - 4.0 K/uL   Monocytes Absolute 0.6  0.1 - 1.0 K/uL   Eosinophils Absolute 0.0  0.0 - 0.7 K/uL   Basophils Absolute 0.0  0.0 - 0.1 K/uL   WBC Morphology INCREASED  BANDS (>20% BANDS)    COMPREHENSIVE METABOLIC PANEL      Result Value Ref Range   Sodium 134 (*) 137 - 147 mEq/L   Potassium 4.4  3.7 - 5.3 mEq/L   Chloride 96  96 - 112 mEq/L   CO2 21  19 - 32 mEq/L   Glucose, Bld 174 (*) 70 - 99 mg/dL   BUN 27 (*) 6 - 23 mg/dL   Creatinine, Ser 1.15  0.50 - 1.35 mg/dL   Calcium 9.1  8.4 - 10.5 mg/dL   Total Protein 7.7  6.0 - 8.3 g/dL   Albumin 3.1 (*) 3.5 - 5.2 g/dL   AST 22  0 - 37 U/L    ALT 11  0 - 53 U/L   Alkaline Phosphatase 71  39 - 117 U/L   Total Bilirubin 0.6  0.3 - 1.2 mg/dL   GFR calc non Af Amer 65 (*) >90 mL/min   GFR calc Af Amer 76 (*) >90 mL/min   Anion gap 17 (*) 5 - 15  TROPONIN I      Result Value Ref Range   Troponin I <0.30  <0.30 ng/mL  PRO B NATRIURETIC PEPTIDE      Result Value Ref Range   Pro B Natriuretic peptide (BNP) 29729.0 (*) 0 - 125 pg/mL  CBC      Result Value Ref Range   WBC 6.4  4.0 - 10.5 K/uL   RBC 2.22 (*) 4.22 - 5.81 MIL/uL   Hemoglobin 6.7 (*) 13.0 - 17.0 g/dL   HCT 19.7 (*) 39.0 - 52.0 %   MCV 88.7  78.0 - 100.0 fL   MCH 30.2  26.0 - 34.0 pg   MCHC 34.0  30.0 - 36.0 g/dL   RDW 15.5  11.5 - 15.5 %   Platelets 167  150 - 400 K/uL  CREATININE, SERUM      Result Value Ref Range   Creatinine, Ser 1.17  0.50 - 1.35 mg/dL   GFR calc non Af Amer 64 (*) >90 mL/min   GFR calc Af Amer 74 (*) >90 mL/min  TSH      Result Value Ref Range   TSH 0.554  0.350 - 4.500 uIU/mL  TROPONIN I      Result Value Ref Range   Troponin I 0.55 (*) <0.30 ng/mL  TROPONIN I      Result Value Ref Range   Troponin I 0.38 (*) <0.30 ng/mL  TROPONIN I      Result Value Ref Range   Troponin I 0.64 (*) <0.30 ng/mL  HEMOGLOBIN A1C      Result Value Ref Range   Hemoglobin A1C 6.8 (*) <5.7 %   Mean Plasma Glucose 148 (*) <117 mg/dL  IRON AND TIBC      Result Value Ref Range   Iron <10 (*) 42 - 135 ug/dL   TIBC Not calculated due to Iron <10.  215 - 435 ug/dL   Saturation Ratios Not calculated due to Iron <10.  20 - 55 %   UIBC 180  125 - 400 ug/dL  LACTIC ACID, PLASMA      Result Value Ref Range   Lactic Acid, Venous 1.8  0.5 - 2.2 mmol/L  COMPREHENSIVE METABOLIC PANEL      Result Value Ref Range   Sodium 135 (*) 137 - 147 mEq/L   Potassium 4.2  3.7 - 5.3 mEq/L   Chloride 100  96 - 112 mEq/L   CO2 21  19 - 32 mEq/L   Glucose, Bld 154 (*) 70 - 99 mg/dL   BUN 27 (*) 6 - 23 mg/dL   Creatinine, Ser 1.17  0.50 - 1.35 mg/dL   Calcium 8.3 (*)  8.4 - 10.5 mg/dL   Total Protein 7.3  6.0 - 8.3 g/dL   Albumin 2.6 (*) 3.5 - 5.2 g/dL   AST 65 (*) 0 - 37 U/L   ALT 34  0 - 53 U/L   Alkaline Phosphatase 109  39 - 117 U/L   Total Bilirubin 1.0  0.3 - 1.2 mg/dL   GFR calc non Af Amer 64 (*) >90 mL/min   GFR calc Af Amer 74 (*) >90 mL/min   Anion gap 14  5 - 15  CBC      Result Value Ref Range   WBC 7.7  4.0 - 10.5 K/uL   RBC 3.56 (*) 4.22 - 5.81 MIL/uL   Hemoglobin 10.3 (*) 13.0 - 17.0 g/dL   HCT 30.8 (*) 39.0 - 52.0 %   MCV 86.5  78.0 - 100.0 fL   MCH 28.9  26.0 - 34.0 pg   MCHC 33.4  30.0 - 36.0 g/dL   RDW 15.6 (*) 11.5 - 15.5 %   Platelets 159  150 - 400 K/uL  GLUCOSE, CAPILLARY      Result Value Ref Range   Glucose-Capillary 175 (*) 70 - 99 mg/dL  PROCALCITONIN      Result Value Ref Range   Procalcitonin 72.53    BASIC METABOLIC PANEL      Result Value Ref Range   Sodium 135 (*) 137 - 147 mEq/L   Potassium 3.9  3.7 - 5.3 mEq/L   Chloride 99  96 - 112 mEq/L   CO2 21  19 - 32 mEq/L   Glucose, Bld 466 (*) 70 - 99 mg/dL   BUN 36 (*) 6 - 23 mg/dL   Creatinine, Ser 1.41 (*) 0.50 - 1.35 mg/dL   Calcium 8.4  8.4 - 10.5 mg/dL   GFR calc non Af Amer 51 (*) >90 mL/min   GFR calc Af Amer 59 (*) >90 mL/min   Anion gap 15  5 - 15  PROCALCITONIN      Result Value Ref Range   Procalcitonin 52.46    GLUCOSE, CAPILLARY      Result Value Ref Range   Glucose-Capillary 446 (*) 70 - 99 mg/dL  TROPONIN I      Result Value Ref Range   Troponin I <0.30  <0.30 ng/mL  TROPONIN I      Result Value Ref Range   Troponin I <0.30  <0.30 ng/mL  GLUCOSE, CAPILLARY      Result Value Ref Range   Glucose-Capillary 198 (*) 70 - 99 mg/dL  GLUCOSE, CAPILLARY      Result Value Ref Range   Glucose-Capillary 143 (*) 70 - 99 mg/dL  BASIC METABOLIC PANEL      Result Value Ref Range   Sodium 137  137 - 147 mEq/L   Potassium 3.3 (*) 3.7 - 5.3 mEq/L   Chloride 98  96 - 112 mEq/L   CO2 21  19 - 32 mEq/L   Glucose, Bld 344 (*) 70 - 99 mg/dL   BUN  45 (*) 6 - 23 mg/dL   Creatinine, Ser 1.50 (*) 0.50 - 1.35 mg/dL   Calcium 8.9  8.4 - 10.5 mg/dL   GFR calc non Af Amer 47 (*) >90 mL/min   GFR calc  Af Amer 55 (*) >90 mL/min   Anion gap 18 (*) 5 - 15  PROCALCITONIN      Result Value Ref Range   Procalcitonin 35.19    CBC      Result Value Ref Range   WBC 6.4  4.0 - 10.5 K/uL   RBC 3.26 (*) 4.22 - 5.81 MIL/uL   Hemoglobin 9.6 (*) 13.0 - 17.0 g/dL   HCT 27.5 (*) 39.0 - 52.0 %   MCV 84.4  78.0 - 100.0 fL   MCH 29.4  26.0 - 34.0 pg   MCHC 34.9  30.0 - 36.0 g/dL   RDW 15.8 (*) 11.5 - 15.5 %   Platelets 157  150 - 400 K/uL  GLUCOSE, CAPILLARY      Result Value Ref Range   Glucose-Capillary 252 (*) 70 - 99 mg/dL   Comment 1 Notify RN    GLUCOSE, CAPILLARY      Result Value Ref Range   Glucose-Capillary 241 (*) 70 - 99 mg/dL   Comment 1 Notify RN    GLUCOSE, CAPILLARY      Result Value Ref Range   Glucose-Capillary 332 (*) 70 - 99 mg/dL  GLUCOSE, CAPILLARY      Result Value Ref Range   Glucose-Capillary 301 (*) 70 - 99 mg/dL   Comment 1 Documented in Chart     Comment 2 Notify RN    GLUCOSE, CAPILLARY      Result Value Ref Range   Glucose-Capillary 314 (*) 70 - 99 mg/dL   Comment 1 Documented in Chart     Comment 2 Notify RN    BASIC METABOLIC PANEL      Result Value Ref Range   Sodium 135 (*) 137 - 147 mEq/L   Potassium 3.6 (*) 3.7 - 5.3 mEq/L   Chloride 97  96 - 112 mEq/L   CO2 21  19 - 32 mEq/L   Glucose, Bld 357 (*) 70 - 99 mg/dL   BUN 41 (*) 6 - 23 mg/dL   Creatinine, Ser 1.28  0.50 - 1.35 mg/dL   Calcium 8.9  8.4 - 10.5 mg/dL   GFR calc non Af Amer 58 (*) >90 mL/min   GFR calc Af Amer 67 (*) >90 mL/min   Anion gap 17 (*) 5 - 15  GLUCOSE, RANDOM      Result Value Ref Range   Glucose, Bld 62 (*) 70 - 99 mg/dL  GLUCOSE, CAPILLARY      Result Value Ref Range   Glucose-Capillary 65 (*) 70 - 99 mg/dL   Comment 1 Documented in Chart     Comment 2 Repeat Test     Comment 3 Notify RN    GLUCOSE, CAPILLARY       Result Value Ref Range   Glucose-Capillary 67 (*) 70 - 99 mg/dL   Comment 1 Documented in Chart     Comment 2 Notify RN    PROTIME-INR      Result Value Ref Range   Prothrombin Time 14.9  11.6 - 15.2 seconds   INR 1.17  0.00 - 1.49  APTT      Result Value Ref Range   aPTT 46 (*) 24 - 37 seconds  CBC      Result Value Ref Range   WBC 5.2  4.0 - 10.5 K/uL   RBC 3.27 (*) 4.22 - 5.81 MIL/uL   Hemoglobin 9.6 (*) 13.0 - 17.0 g/dL   HCT 28.3 (*) 39.0 - 52.0 %  MCV 86.5  78.0 - 100.0 fL   MCH 29.4  26.0 - 34.0 pg   MCHC 33.9  30.0 - 36.0 g/dL   RDW 15.9 (*) 11.5 - 15.5 %   Platelets 148 (*) 150 - 400 K/uL  GLUCOSE, CAPILLARY      Result Value Ref Range   Glucose-Capillary 128 (*) 70 - 99 mg/dL   Comment 1 Documented in Chart     Comment 2 Notify RN    GLUCOSE, CAPILLARY      Result Value Ref Range   Glucose-Capillary 124 (*) 70 - 99 mg/dL  GLUCOSE, CAPILLARY      Result Value Ref Range   Glucose-Capillary 275 (*) 70 - 99 mg/dL   Comment 1 Documented in Chart     Comment 2 Notify RN    GLUCOSE, CAPILLARY      Result Value Ref Range   Glucose-Capillary 304 (*) 70 - 99 mg/dL   Comment 1 Documented in Chart     Comment 2 Notify RN    GLUCOSE, CAPILLARY      Result Value Ref Range   Glucose-Capillary 375 (*) 70 - 99 mg/dL   Comment 1 Documented in Chart     Comment 2 Notify RN    GLUCOSE, CAPILLARY      Result Value Ref Range   Glucose-Capillary 227 (*) 70 - 99 mg/dL   Comment 1 Documented in Chart     Comment 2 Notify RN    I-STAT TROPOININ, ED      Result Value Ref Range   Troponin i, poc 0.40 (*) 0.00 - 0.08 ng/mL   Comment NOTIFIED PHYSICIAN     Comment 3           TYPE AND SCREEN      Result Value Ref Range   ABO/RH(D) A POS     Antibody Screen NEG     Sample Expiration 03/03/2014     Unit Number [id number]     Blood Component Type RED CELLS,LR     Unit division 00     Status of Unit ISSUED,FINAL     Transfusion Status OK TO TRANSFUSE     Crossmatch Result  Compatible     Unit Number [id number]     Blood Component Type RED CELLS,LR     Unit division 00     Status of Unit ISSUED,FINAL     Transfusion Status OK TO TRANSFUSE     Crossmatch Result Compatible    PREPARE RBC (CROSSMATCH)      Result Value Ref Range   Order Confirmation ORDER PROCESSED BY BLOOD BANK     A/P: Pt with hx esophageal carcinoma and recent admission for MSSA bacteremia/PNA. Tent plan is for port a cath removal today with possible PICC placement either today or tomorrow. Details/risks of procedure d/w pt with his understanding and consent.

## 2014-03-04 NOTE — Progress Notes (Signed)
TELEMETRY: Reviewed telemetry pt in NSR with frequent PVCs and 4-5 beat runs of NSVT.  Filed Vitals:   03/04/14 0333 03/04/14 0400 03/04/14 0415 03/04/14 0600  BP: 125/75 134/81 134/76 100/58  Pulse: 85 79 83   Temp:  98.2 F (36.8 C)    TempSrc:  Oral    Resp: 20 18 19    Height:      Weight:      SpO2: 95% 96% 95%     Intake/Output Summary (Last 24 hours) at 03/04/14 0659 Last data filed at 03/04/14 0600  Gross per 24 hour  Intake   4360 ml  Output    800 ml  Net   3560 ml   Filed Weights   02/28/14 1943 02/28/14 2103  Weight: 103 lb (46.72 kg) 104 lb 11.5 oz (47.5 kg)    Subjective 63 AAM dx with squamous carcinoma of the esophagus 6/15 and quit smoking in July. He has had 26/30 RTX treatments and om 8/31 was sent from RTX oncology to [facility] ED due to tachycardic, hypotension and FTT. Chest Xray was suggestive of Rt pna(suspect aspiration) and he continued to need increased FIO2. He was given PRBC for hgb of 6.7 but had a decline in blood pressure. Troponins mildly elevated. Echo shows decline in EF to 15%. Blood cultures positive for  MSSA. Patient reports he feels all right. No chest pain, dyspnea, or cough.  . sodium chloride   Intravenous Once  . aspirin  81 mg Per Tube Daily  . budesonide (PULMICORT) nebulizer solution  0.25 mg Nebulization BID  .  ceFAZolin (ANCEF) IV  2 g Intravenous 3 times per day  . feeding supplement (OSMOLITE 1.5 CAL)  1,000 mL Per Tube Q24H  . folic acid  1 mg Per Tube Daily  . heparin  5,000 Units Subcutaneous 3 times per day  . insulin aspart  0-9 Units Subcutaneous Q4H  . ipratropium  0.5 mg Nebulization Q6H WA  . levalbuterol  1.25 mg Nebulization Q6H WA  . methylPREDNISolone (SOLU-MEDROL) injection  40 mg Intravenous Q6H  . multivitamin with minerals  1 tablet Per Tube Daily  . nicotine  21 mg Transdermal Daily  . sodium chloride  3 mL Intravenous Q12H  . sterile water for irrigation  360 mL Irrigation 3 times per day  . sucralfate   1 g Per Tube TID  . terazosin  1 mg Per Tube QHS  . thiamine  100 mg Per Tube Daily      LABS: Basic Metabolic Panel:  Recent Labs  03/03/14 0555 03/03/14 1750 03/04/14 0418  NA 137  --  135*  K 3.3*  --  3.6*  CL 98  --  97  CO2 21  --  21  GLUCOSE 344* 62* 357*  BUN 45*  --  41*  CREATININE 1.50*  --  1.28  CALCIUM 8.9  --  8.9   Liver Function Tests: No results found for this basename: AST, ALT, ALKPHOS, BILITOT, PROT, ALBUMIN,  in the last 72 hours No results found for this basename: LIPASE, AMYLASE,  in the last 72 hours CBC:  Recent Labs  03/03/14 0555 03/04/14 0418  WBC 6.4 5.2  HGB 9.6* 9.6*  HCT 27.5* 28.3*  MCV 84.4 86.5  PLT 157 148*   Cardiac Enzymes:  Recent Labs  03/01/14 1218 03/02/14 1030 03/02/14 1556  TROPONINI 0.64* <0.30 <0.30   BNP: No results found for this basename: PROBNP,  in the last 72 hours  D-Dimer: No results found for this basename: DDIMER,  in the last 72 hours Hemoglobin A1C: No results found for this basename: HGBA1C,  in the last 72 hours Fasting Lipid Panel: No results found for this basename: CHOL, HDL, LDLCALC, TRIG, CHOLHDL, LDLDIRECT,  in the last 72 hours Thyroid Function Tests: No results found for this basename: TSH, T4TOTAL, FREET3, T3FREE, THYROIDAB,  in the last 72 hours   Radiology/Studies:  No results found.  PHYSICAL EXAM General: Chronically ill appearing BM, thin Head: Normocephalic, atraumatic, sclera non-icteric, oropharynx is clear Neck: Negative for carotid bruits. JVD not elevated. No adenopathy Lungs: Few rhonchi. Breathing is unlabored. Lying supine. Heart: RRR S1 S2 without murmurs, rubs, or gallops.  Abdomen: Soft, non-tender, non-distended with normoactive bowel sounds. PEG tube in place. Extremities: No clubbing, cyanosis or edema.  Distal pedal pulses are 2+ and equal bilaterally. Neuro: Alert and oriented X 3. Moves all extremities spontaneously. Psych:  Responds to questions  appropriately with a normal affect.  ASSESSMENT AND PLAN: 1. Hypoxic respiratory failure. Improved on [place] oxygen. In no distress.  2. Severe nonischemic cardiomyopathy. EF 15%. Appears to be euvolemic at this time. Low BP limits use of ACEi or beta blocker. Options very limited.  3. Elevated troponin. Mild. Due to multiple stressors. Not a candidate for further evaluation. 4. Aspiration PNA- on IV antibiotics. 5. Esophageal CA 6. NSVT. Maintain potassium >4.0, magnesium > 2.0. Consider beta blocker when BP allows.  7. Anemia of chronic disease. Improved post transfusion. 8. MSSA  Mr. Vera is stable from a cardiac standpoint. Unfortunately further treatment of cardiomyopathy limited by hypotension, renal insufficiency, COPD. May consider ACEi or beta blocker in the future if medical condition improves. For now will sign off. Please call with questions.  Present on Admission:  . (Resolved) Healthcare-associated pneumonia . COPD (chronic obstructive pulmonary disease) . Chronic systolic CHF (congestive heart failure) . (Resolved) Cancer of upper third of esophagus . (Resolved) Dehydration . (Resolved) Pneumonia . HCAP (healthcare-associated pneumonia) . Acute-on-chronic respiratory failure . Staphylococcus aureus bacteremia with sepsis . Squamous cell esophageal cancer . Severe protein-calorie malnutrition . Nonischemic cardiomyopathy . CAD (coronary artery disease) . Mitral regurgitation . Normocytic anemia . Leukopenia  Signed, Peter Martin[place], [place] 03/04/2014 6:59 AM

## 2014-03-04 NOTE — Progress Notes (Signed)
Patient ID: Michael Keith, male   DOB: 1951/04/22, 63 y.o.   MRN: [id number]         [facility]    Date of Admission:  02/28/2014   Total days of antibiotics 5        Day 2 cefazolin         Principal Problem:   Staphylococcus aureus bacteremia with sepsis Active Problems:   Mitral regurgitation   Port-a-cath in place   HCAP (healthcare-associated pneumonia)   Nonischemic cardiomyopathy   COPD (chronic obstructive pulmonary disease)   CAD (coronary artery disease)   Chronic systolic CHF (congestive heart failure)   Squamous cell esophageal cancer   Severe protein-calorie malnutrition   Acute-on-chronic respiratory failure   Renal insufficiency   Normocytic anemia   Leukopenia   . sodium chloride   Intravenous Once  . aspirin  81 mg Per Tube Daily  . budesonide (PULMICORT) nebulizer solution  0.25 mg Nebulization BID  .  ceFAZolin (ANCEF) IV  2 g Intravenous 3 times per day  . feeding supplement (OSMOLITE 1.5 CAL)  1,000 mL Per Tube Q24H  . folic acid  1 mg Per Tube Daily  . heparin  5,000 Units Subcutaneous 3 times per day  . insulin aspart  0-9 Units Subcutaneous Q4H  . ipratropium  0.5 mg Nebulization Q6H WA  . levalbuterol  1.25 mg Nebulization Q6H WA  . methylPREDNISolone (SOLU-MEDROL) injection  40 mg Intravenous Q6H  . multivitamin with minerals  1 tablet Per Tube Daily  . nicotine  21 mg Transdermal Daily  . sodium chloride  3 mL Intravenous Q12H  . sterile water for irrigation  360 mL Irrigation 3 times per day  . sucralfate  1 g Per Tube TID  . terazosin  1 mg Per Tube QHS  . thiamine  100 mg Per Tube Daily    Subjective: He is feeling better. He states that his cough is more of a dry cough now. He's not having any shortness of breath.  Objective: Temp:  [97.7 F (36.5 C)-98.4 F (36.9 C)] 98.2 F (36.8 C) (09/04 0805) Pulse Rate:  [79-94] 83 (09/04 0415) Resp:  [17-24] 19 (09/04 0415) BP: (94-134)/(57-81) 100/58 mmHg (09/04  0600) SpO2:  [95 %-98 %] 98 % (09/04 0805)  General: He is more alert and comfortable Skin: No splinter or conjunctival hemorrhages Lungs: Clear Cor: Distant heart sounds  Lab Results Lab Results  Component Value Date   WBC 5.2 03/04/2014   HGB 9.6* 03/04/2014   HCT 28.3* 03/04/2014   MCV 86.5 03/04/2014   PLT 148* 03/04/2014    Lab Results  Component Value Date   CREATININE 1.28 03/04/2014   BUN 41* 03/04/2014   NA 135* 03/04/2014   K 3.6* 03/04/2014   CL 97 03/04/2014   CO2 21 03/04/2014    Lab Results  Component Value Date   ALT 34 03/01/2014   AST 65* 03/01/2014   ALKPHOS 109 03/01/2014   BILITOT 1.0 03/01/2014      Microbiology: Recent Results (from the past 240 hour(s))  CULTURE, BLOOD (ROUTINE X 2)     Status: None   Collection Time    02/28/14  4:36 PM      Result Value Ref Range Status   Specimen Description BLOOD RIGHT HAND   Final   Special Requests BOTTLES DRAWN AEROBIC AND ANAEROBIC 3 ML   Final   Culture  Setup Time     Final  Value: 02/28/2014 18:52     Performed at [facility]   Culture     Final   Value: STAPHYLOCOCCUS AUREUS     Note: SUSCEPTIBILITIES PERFORMED ON PREVIOUS CULTURE WITHIN THE LAST 5 DAYS.     Note: Gram Stain Report Called to,Read Back By and Verified With:  LISA FREY @[id number]  03/01/14 SMIAS     Performed at [facility]   Report Status 03/03/2014 FINAL   Final  CULTURE, BLOOD (ROUTINE X 2)     Status: None   Collection Time    02/28/14  4:36 PM      Result Value Ref Range Status   Specimen Description BLOOD RIGHT PORT   Final   Special Requests BOTTLES DRAWN AEROBIC AND ANAEROBIC 5 ML   Final   Culture  Setup Time     Final   Value: 02/28/2014 18:53     Performed at [facility]   Culture     Final   Value: STAPHYLOCOCCUS AUREUS     Note: RIFAMPIN AND GENTAMICIN SHOULD NOT BE USED AS SINGLE DRUGS FOR TREATMENT OF STAPH INFECTIONS.     Note: Gram Stain Report Called to,Read Back By and Verified With:  Clydene Fake [phone]  03/01/14 SMIAS     Performed at [facility]   Report Status 03/03/2014 FINAL   Final   Organism ID, Bacteria STAPHYLOCOCCUS AUREUS   Final  MRSA PCR SCREENING     Status: None   Collection Time    02/28/14  9:33 PM      Result Value Ref Range Status   MRSA by PCR NEGATIVE  NEGATIVE Final   Comment:            The GeneXpert MRSA Assay (FDA     approved for NASAL specimens     only), is one component of a     comprehensive MRSA colonization     surveillance program. It is not     intended to diagnose MRSA     infection nor to guide or     monitor treatment for     MRSA infections.  CULTURE, BLOOD (ROUTINE X 2)     Status: None   Collection Time    03/02/14 10:30 AM      Result Value Ref Range Status   Specimen Description BLOOD PAC   Final   Special Requests BOTTLES DRAWN AEROBIC AND ANAEROBIC 10CC   Final   Culture  Setup Time     Final   Value: 03/02/2014 13:24     Performed at [facility]   Culture     Final   Value:        BLOOD CULTURE RECEIVED NO GROWTH TO DATE CULTURE WILL BE HELD FOR 5 DAYS BEFORE ISSUING A FINAL NEGATIVE REPORT     Performed at [facility]   Report Status PENDING   Incomplete  CULTURE, BLOOD (ROUTINE X 2)     Status: None   Collection Time    03/02/14 10:40 AM      Result Value Ref Range Status   Specimen Description BLOOD RIGHT HAND   Final   Special Requests BOTTLES DRAWN AEROBIC AND ANAEROBIC [facility]   Final   Culture  Setup Time     Final   Value: 03/02/2014 13:24     Performed at [facility]   Culture     Final   Value:        BLOOD  CULTURE RECEIVED NO GROWTH TO DATE CULTURE WILL BE HELD FOR 5 DAYS BEFORE ISSUING A FINAL NEGATIVE REPORT     Performed at [facility]   Report Status PENDING   Incomplete   Assessment: He is improving on therapy for MSSA bacteremia and pneumonia. I would recommend continuing cefazolin for 4 weeks total and the Port-A-Cath removal with temporary PICC placement if repeat  blood cultures remain negative tomorrow. Unfortunately, his overall prognosis is extremely poor given his severe cardiomyopathy, esophageal cancer and protein calorie malnutrition. I would also consider platelet of care of evaluation while he is here.  Plan: 1. Continue cefazolin 2. Recommend Port-A-Cath removal and PICC placement with a repeat blood cultures are negative 3. Recommend palliative care evaluation 4. Please call Dr. Lita Mains [phone]) for any infectious disease questions this weekend  Michel Bickers, MD [facility] [phone] pager   [phone] cell 03/04/2014, 9:42 AM

## 2014-03-04 NOTE — Progress Notes (Signed)
[facility] Therapy Treatment Record Phone [phone]   Radiation Therapy was administered to Michael Keith on: 03/04/2014  2:31 PM and was treatment # 29 out of a planned course of 30 treatments.

## 2014-03-04 NOTE — Plan of Care (Signed)
Problem: Phase I Progression Outcomes Goal: OOB as tolerated unless otherwise ordered Outcome: Progressing Up with PT today     

## 2014-03-04 NOTE — Progress Notes (Addendum)
Inpatient Diabetes Program Recommendations  AACE/ADA: New Consensus Statement on Inpatient Glycemic Control (2013)  Target Ranges:  Prepandial:   less than 140 mg/dL      Peak postprandial:   less than 180 mg/dL (1-2 hours)      Critically ill patients:  140 - 180 mg/dL    Results for Michael Keith, Michael Keith (MRN [id number]) as of 03/04/2014 07:48  Ref. Range 03/02/2014 23:49 03/03/2014 04:08 03/03/2014 08:05 03/03/2014 12:33 03/03/2014 17:16 03/03/2014 17:19 03/03/2014 19:18 03/03/2014 20:20  Glucose-Capillary Latest Range: 70-99 mg/dL 241 (H) 332 (H) 301 (H) 314 (H) 65 (L) 67 (L) 128 (H) 124 (H)    Results for Michael Keith, Michael Keith (MRN [id number]) as of 03/04/2014 07:48  Ref. Range 03/03/2014 23:40 03/04/2014 03:48  Glucose-Capillary Latest Range: 70-99 mg/dL 275 (H) 304 (H)    Patient receiving IV Solumedrol 40 mg Q6 hours at present.   Patient also getting Osmolite tube feeds at 115 cc/hour from the hours of 8PM-6AM.   Having significant glucose elevations in the night time hours likely due to a combination of the tube feeds and IV steroids.  Current Orders= Novolog Sensitive SSI Q4 hours     MD- Please consider the following insulin adjustments:   Add tube feed coverage while patient gets tube feeds during the night- Scheduled Novolog 4 units at 8PM, 12 midnight, and 4AM

## 2014-03-04 NOTE — Plan of Care (Signed)
Problem: Phase II Progression Outcomes Goal: Progress activity as tolerated unless otherwise ordered Outcome: Progressing Up with PT

## 2014-03-04 NOTE — Plan of Care (Signed)
Problem: Phase I Progression Outcomes Goal: OOB as tolerated unless otherwise ordered Outcome: Progressing amb with PT

## 2014-03-04 NOTE — Progress Notes (Signed)
PROGRESS NOTE  Michael Keith GMW:[id number] DOB: Feb 01, 1951 DOA: 02/28/2014 PCP: Philis Fendt, MD  Assessment/Plan: acute on chronic resp failure with hypoxia: multifactorial.  -Secondary to HCAP, COPD exacerbation  -remains in stepdown unit  -d/c IV lasix  -continue solumedrol, pulmicort and PRN nebulizer  -continue oxygen supplementation  -appreciate PCCM assistance  -normally on 2L at SNF  -wean steroids  sepsis  -present at time of admission  - due to HCAP and bacteremia  -continue IV abx Bacteremia-MSSA  -d/c zosyn  -d/c vanco  -03/03/14--start cefazolin  -surveillance blood culture--neg so far  -03/04/14--removal of port-a-cath (placed 01/19/14) -PICC placement on 03/05/14 if surveillance cultures are negative -will not be able to perform TEE due to esophageal cancer  -appreciate ID eval elevated troponin:  -appears to be demand ischemia in nature.  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 Josepha Pigg chemotherapy 01/25/2014  -continue enteral feeds 8PM-6AM--tolerating well -Laparoscopic gastrostomy tube placement 02/02/2014--Dr. Johnathan Hausen Code Status: Full  Family Communication: no family at bedside    Disposition Plan: SNF   Antibiotics:  Zosyn 02/28/14>>>03/03/14  vanco 02/28/14>>>03/03/14  Cefazolin 03/03/14>>>       Procedures/Studies: Dg Chest 2 View  02/28/2014   CLINICAL DATA:  Fever.  Shortness of breath.  Chest pain.  EXAM: CHEST  2 VIEW  COMPARISON:  01/31/2014  FINDINGS: The patient is rotated to the Right on today's radiograph, reducing diagnostic sensitivity and specificity. Abnormal airspace opacities observed in the right mid lung and right lung base. Indistinct pulmonary vasculature with cardiopericardial silhouette enlarged compared to the prior exam.  Power injectable Port-A-Cath tip:  Lower SVC.  Emphysema noted.  No pleural effusion identified.  IMPRESSION: 1. Newly enlarged cardiopericardial silhouette with indistinct pulmonary vasculature favoring pulmonary venous hypertension/congestive heart failure. However, there is also confluent airspace opacity in the right lower lobe and potentially right middle lobe which could reflect asymmetric edema or pneumonia. 2. Emphysema.   Electronically Signed   By: Sherryl Barters M.D.   On: 02/28/2014 17:44   Dg Chest Port 1 View  03/02/2014   CLINICAL DATA:  Shortness of breath, infiltrate.  EXAM: PORTABLE CHEST - 1 VIEW  COMPARISON:  03/01/2014  FINDINGS: Degraded by rotation. Prominent cardiomediastinal contours. Aortic tortuosity. Right chest wall Port-A-Cath with tip projecting over the mid SVC. Mild peripheral opacity right lower lung, improving. Mild increased left infrahilar opacity. Background interstitial prominence/ COPD. Small right pleural effusion. No pneumothorax. Osteopenia.  IMPRESSION: Improving aeration along the periphery of the right lung. Mild residual right lung base opacity and mild increased left infrahilar opacity; atelectasis versus infiltrate.  Small right pleural effusion.  Interstitial prominence/COPD.  Mild interstitial edema not excluded.   Electronically Signed   By: Delano Metz.D.  On: 03/02/2014 05:57    Dg Chest Port 1 View  03/01/2014   CLINICAL DATA:  Shortness of breath.  EXAM: PORTABLE CHEST - 1 VIEW  COMPARISON:  March 09, 2014.  FINDINGS: Stable cardiomediastinal silhouette. Right internal jugular Port-A-Cath is unchanged in position. No pneumothorax or significant pleural effusion is noted. Stable airspace opacity is noted laterally in the right midlung concerning for pneumonia or subsegmental atelectasis. Stable central pulmonary vascular congestion is noted as well as probable bilateral pulmonary edema.  IMPRESSION: Stable central pulmonary vascular congestion and pulmonary edema is noted. Stable opacity is noted laterally in right midlung consistent with atelectasis or pneumonia.   Electronically Signed   By: Sabino Dick M.D.   On: 03/01/2014 08:25   Dg Chest Port 1 View  03/01/2014   CLINICAL DATA:  Pneumonia.  EXAM: PORTABLE CHEST - 1 VIEW  COMPARISON:  PA and lateral chest 02/28/2014 and 01/31/2014. PET CT scan 12/21/2013.  FINDINGS: Port-A-Cath remains in place. The lungs are emphysematous. Airspace opacity in the right mid lung zone persists but appears mildly improved. Cardiomegaly and pulmonary vascular congestion are again seen. PEG tube is noted.  IMPRESSION: Airspace opacity right mid lung zone likely due to pneumonia appears mildly improved.  Cardiomegaly and vascular congestion.  Emphysema.   Electronically Signed   By: Inge Rise M.D.   On: 03/01/2014 07:27   Dg Abd 2 Views  02/04/2014   CLINICAL DATA:  Nausea and vomiting  EXAM: ABDOMEN - 2 VIEW  COMPARISON:  None.  FINDINGS: Gastrostomy tube is in place. No free intraperitoneal gas on the decubitus image. Diffuse colonic distension is noted. Phleboliths project over the pelvis.  IMPRESSION: Diffuse colonic distention.  No free intraperitoneal gas.   Electronically Signed   By: Maryclare Bean M.D.   On: 02/04/2014 11:12         Subjective: Patient denies fevers, chills, headache, chest pain, dyspnea, nausea, vomiting,  diarrhea, abdominal pain, dysuria, hematuria   Objective: Filed Vitals:   03/04/14 0415 03/04/14 0600 03/04/14 0805 03/04/14 1200  BP: 134/76 100/58    Pulse: 83     Temp:   98.2 F (36.8 C) 97.7 F (36.5 C)  TempSrc:   Oral Oral  Resp: 19     Height:      Weight:      SpO2: 95%  98%     Intake/Output Summary (Last 24 hours) at 03/04/14 1525 Last data filed at 03/04/14 0600  Gross per 24 hour  Intake   3900 ml  Output    500 ml  Net   3400 ml   Weight change:  Exam:   General:  Pt is alert, follows commands appropriately, not in acute distress  HEENT: No icterus, No thrush,  [place]/AT  Cardiovascular: RRR, S1/S2, no rubs, no gallops  Respiratory: Bibasilar crackles, right greater than left. No wheezing.   Abdomen: Soft/+BS, non tender, non distended, no guarding  Extremities: trace LE edema, No lymphangitis, No petechiae, No rashes, no synovitis  Data Reviewed: Basic Metabolic Panel:  Recent Labs Lab 02/28/14 1636 02/28/14 1918 03/01/14 0630 03/02/14 0545 03/03/14 0555 03/03/14 1750 03/04/14 0418  NA 134*  --  135* 135* 137  --  135*  K 4.4  --  4.2 3.9 3.3*  --  3.6*  CL 96  --  100 99 98  --  97  CO2 21  --  21 21 21   --  21  GLUCOSE 174*  --  154* 466*  344* 62* 357*  BUN 27*  --  27* 36* 45*  --  41*  CREATININE 1.15 1.17 1.17 1.41* 1.50*  --  1.28  CALCIUM 9.1  --  8.3* 8.4 8.9  --  8.9   Liver Function Tests:  Recent Labs Lab 02/27/14 1620 02/28/14 1636 03/01/14 0630  AST 18 22 65*  ALT 10 11 34  ALKPHOS 68 71 109  BILITOT 0.4 0.6 1.0  PROT 7.2 7.7 7.3  ALBUMIN 3.0* 3.1* 2.6*   No results found for this basename: LIPASE, AMYLASE,  in the last 168 hours No results found for this basename: AMMONIA,  in the last 168 hours CBC:  Recent Labs Lab 02/27/14 1620 02/28/14 1636 02/28/14 1918 03/01/14 0630 03/03/14 0555 03/04/14 0418  WBC 2.7* 6.4 6.4 7.7 6.4 5.2  NEUTROABS 1.6* 5.5  --   --   --   --   HGB 7.5* 7.4* 6.7* 10.3* 9.6* 9.6*   HCT 22.8* 22.2* 19.7* 30.8* 27.5* 28.3*  MCV 89.8 89.2 88.7 86.5 84.4 86.5  PLT 185 185 167 159 157 148*   Cardiac Enzymes:  Recent Labs Lab 02/28/14 1918 03/01/14 0630 03/01/14 1218 03/02/14 1030 03/02/14 1556  TROPONINI 0.55* 0.38* 0.64* <0.30 <0.30   BNP: No components found with this basename: POCBNP,  CBG:  Recent Labs Lab 03/03/14 2020 03/03/14 2340 03/04/14 0348 03/04/14 0836 03/04/14 1205  GLUCAP 124* 275* 304* 375* 227*    Recent Results (from the past 240 hour(s))  CULTURE, BLOOD (ROUTINE X 2)     Status: None   Collection Time    02/28/14  4:36 PM      Result Value Ref Range Status   Specimen Description BLOOD RIGHT HAND   Final   Special Requests BOTTLES DRAWN AEROBIC AND ANAEROBIC 3 ML   Final   Culture  Setup Time     Final   Value: 02/28/2014 18:52     Performed at [facility]   Culture     Final   Value: STAPHYLOCOCCUS AUREUS     Note: SUSCEPTIBILITIES PERFORMED ON PREVIOUS CULTURE WITHIN THE LAST 5 DAYS.     Note: Gram Stain Report Called to,Read Back By and Verified With:  LISA FREY @[id number]  03/01/14 SMIAS     Performed at [facility]   Report Status 03/03/2014 FINAL   Final  CULTURE, BLOOD (ROUTINE X 2)     Status: None   Collection Time    02/28/14  4:36 PM      Result Value Ref Range Status   Specimen Description BLOOD RIGHT PORT   Final   Special Requests BOTTLES DRAWN AEROBIC AND ANAEROBIC 5 ML   Final   Culture  Setup Time     Final   Value: 02/28/2014 18:53     Performed at [facility]   Culture     Final   Value: STAPHYLOCOCCUS AUREUS     Note: RIFAMPIN AND GENTAMICIN SHOULD NOT BE USED AS SINGLE DRUGS FOR TREATMENT OF STAPH INFECTIONS.     Note: Gram Stain Report Called to,Read Back By and Verified With:  Clydene Fake [phone] 03/01/14 SMIAS     Performed at [facility]   Report Status 03/03/2014 FINAL   Final   Organism ID, Bacteria STAPHYLOCOCCUS AUREUS   Final  MRSA PCR SCREENING     Status:  None   Collection Time    02/28/14  9:33 PM      Result Value Ref Range  Status   MRSA by PCR NEGATIVE  NEGATIVE Final   Comment:            The GeneXpert MRSA Assay (FDA     approved for NASAL specimens     only), is one component of a     comprehensive MRSA colonization     surveillance program. It is not     intended to diagnose MRSA     infection nor to guide or     monitor treatment for     MRSA infections.  CULTURE, BLOOD (ROUTINE X 2)     Status: None   Collection Time    03/02/14 10:30 AM      Result Value Ref Range Status   Specimen Description BLOOD PAC   Final   Special Requests BOTTLES DRAWN AEROBIC AND ANAEROBIC 10CC   Final   Culture  Setup Time     Final   Value: 03/02/2014 13:24     Performed at [facility]   Culture     Final   Value:        BLOOD CULTURE RECEIVED NO GROWTH TO DATE CULTURE WILL BE HELD FOR 5 DAYS BEFORE ISSUING A FINAL NEGATIVE REPORT     Performed at [facility]   Report Status PENDING   Incomplete  CULTURE, BLOOD (ROUTINE X 2)     Status: None   Collection Time    03/02/14 10:40 AM      Result Value Ref Range Status   Specimen Description BLOOD RIGHT HAND   Final   Special Requests BOTTLES DRAWN AEROBIC AND ANAEROBIC 6CC   Final   Culture  Setup Time     Final   Value: 03/02/2014 13:24     Performed at [facility]   Culture     Final   Value:        BLOOD CULTURE RECEIVED NO GROWTH TO DATE CULTURE WILL BE HELD FOR 5 DAYS BEFORE ISSUING A FINAL NEGATIVE REPORT     Performed at [facility]   Report Status PENDING   Incomplete     Scheduled Meds: . sodium chloride   Intravenous Once  . aspirin  81 mg Per Tube Daily  . budesonide (PULMICORT) nebulizer solution  0.25 mg Nebulization BID  .  ceFAZolin (ANCEF) IV  2 g Intravenous 3 times per day  . feeding supplement (OSMOLITE 1.5 CAL)  1,000 mL Per Tube Q24H  . folic acid  1 mg Per Tube Daily  . heparin  5,000 Units Subcutaneous 3 times per day  .  insulin aspart  0-9 Units Subcutaneous Q4H  . ipratropium  0.5 mg Nebulization Q6H WA  . levalbuterol  1.25 mg Nebulization Q6H WA  . methylPREDNISolone (SOLU-MEDROL) injection  40 mg Intravenous Q6H  . multivitamin with minerals  1 tablet Per Tube Daily  . nicotine  21 mg Transdermal Daily  . sodium chloride  3 mL Intravenous Q12H  . sterile water for irrigation  360 mL Irrigation 3 times per day  . sucralfate  1 g Per Tube TID  . terazosin  1 mg Per Tube QHS  . thiamine  100 mg Per Tube Daily   Continuous Infusions:    Joselynn Amoroso, DO  [facility] Pager [phone]  If 7PM-7AM, please contact night-coverage [email or web address] Password [email or web address] 03/04/2014, 3:25 PM   LOS: 4 days

## 2014-03-04 NOTE — Progress Notes (Signed)
Pt refuses the CPT with vest.  Pt states "tired and sore form radiation and surgery." Pt told to call if he decides during the night that he wants vest therapy.

## 2014-03-04 NOTE — Progress Notes (Signed)
CSW continuing to follow for disposition planning.  CSW spoke with pt cousin, Shanon Brow today via telephone.  Pt cousin expressed what pt had discusssed with this CSW that pt has not been satisfied with [facility] and had expressed to pt cousin that he wants to explore other options for SNF. Pt cousin stated that pt had expressed interest in [facility] and [facility] as alternative options. CSW discussed that CSW will initiate a SNF search in [place] in order to explore other options.   CSW completed FL2 and initiated SNF search to [place].  CSW to follow up with pt and pt cousin regarding SNF options.   CSW to continue to follow to assist with pt disposition needs.   Alison Murray, MSW, [place] [facility] [phone]

## 2014-03-04 NOTE — Evaluation (Signed)
Physical Therapy Evaluation Patient Details Name: Michael Keith MRN: [id number] DOB: 1950/10/26 Today's Date: 03/04/2014   History of Present Illness   Michael Keith is a 64 y.o. male with history of esophageal cancer presented to his oncologist for a check up noted to have weak pulse and was sent to ED on 02/28/14. Currently   at [facility] for rehab after previous hospitalization. Pt found with  lung infiltrates. Patient  has a PEG.  Clinical Impression  Pt  Appears  Weaker and less steady than when previously in hospital. Pt did ambulate off of O2 in room with sats > 92%. Patient will benefit from PT to address problems listed in note below.   Follow Up Recommendations SNF    Equipment Recommendations  None recommended by PT    Recommendations for Other Services       Precautions / Restrictions Precautions Precautions: Fall Precaution Comments: monitor sats      Mobility  Bed Mobility Overal bed mobility: Needs Assistance Bed Mobility: Supine to Sit     Supine to sit: Supervision        Transfers Overall transfer level: Needs assistance Equipment used: None Transfers: Sit to/from Stand Sit to Stand: Min assist         General transfer comment: pt  stood and sat right back down,  stood quickly, stabilized second attempt.  Ambulation/Gait Ambulation/Gait assistance: Min assist Ambulation Distance (Feet): 45 Feet Assistive device: None       General Gait Details: gait is unsteady , min steady assist required.   Stairs            Wheelchair Mobility    Modified Rankin (Stroke Patients Only)       Balance Overall balance assessment: Needs assistance Sitting-balance support: Feet supported;No upper extremity supported Sitting balance-Leahy Scale: Normal     Standing balance support: No upper extremity supported;During functional activity Standing balance-Leahy Scale: Fair                               Pertinent Vitals/Pain Pain  Assessment: No/denies pain    Home Living Family/patient expects to be discharged to:: Skilled nursing facility                      Prior Function           Comments: plans to return to a SNF for rehab.     Hand Dominance        Extremity/Trunk Assessment   Upper Extremity Assessment: Generalized weakness           Lower Extremity Assessment: Generalized weakness      Cervical / Trunk Assessment: Normal  Communication      Cognition Arousal/Alertness: Awake/alert Behavior During Therapy: WFL for tasks assessed/performed;Flat affect Overall Cognitive Status: Within Functional Limits for tasks assessed                      General Comments      Exercises        Assessment/Plan    PT Assessment Patient needs continued PT services  PT Diagnosis Difficulty walking   PT Problem List Decreased activity tolerance;Decreased balance  PT Treatment Interventions DME instruction;Gait training;Stair training;Functional mobility training;Therapeutic activities;Therapeutic exercise;Patient/family education   PT Goals (Current goals can be found in the Care Plan section) Acute Rehab PT Goals Patient Stated Goal: to go home PT Goal Formulation: With patient  Time For Goal Achievement: 03/18/14 Potential to Achieve Goals: Good    Frequency Min 2X/week   Barriers to discharge        Co-evaluation               End of Session Equipment Utilized During Treatment: Gait belt Activity Tolerance: Patient tolerated treatment well Patient left: in chair;with call bell/phone within reach (no alrm pads available. CNA is aware. ) Nurse Communication: Mobility status         Time: 1050-1130 PT Time Calculation (min): 40 min   Charges:   PT Evaluation $Initial PT Evaluation Tier I: 1 Procedure PT Treatments $Gait Training: 23-37 mins $Therapeutic Activity: 8-22 mins   PT G Codes:          Marcelino Freestone  PT [phone]  03/04/2014, 11:48 AM

## 2014-03-04 NOTE — Progress Notes (Signed)
CARE MANAGE MENT UTILIZATION REVIEW NOTE 03/04/2014     Patient:  Michael Keith, Michael Keith   Account Number:  [id number]  Documented by:  Suanne Marker DAVIS   Per Ur Regulation Reviewed for med. necessity/level of care/duration of stay

## 2014-03-04 NOTE — Progress Notes (Addendum)
Clinical Social Work Department CLINICAL SOCIAL WORK PLACEMENT NOTE 03/04/2014  Patient:  HARMON, BOMMARITO  Account Number:  [id number] Admit date:  02/28/2014  Clinical Social Worker:  Ulyess Blossom  Date/time:  03/04/2014 01:00 PM  Clinical Social Work is seeking post-discharge placement for this patient at the following level of care:   [place]   (*CSW will update this form in Epic as items are completed)   03/04/2014  Patient/family provided with [facility] Department of Clinical Social Work's list of facilities offering this level of care within the geographic area requested by the patient (or if unable, by the patient's family).  03/04/2014  Patient/family informed of their freedom to choose among providers that offer the needed level of care, that participate in Medicare, Medicaid or managed care program needed by the patient, have an available bed and are willing to accept the patient.  03/04/2014  Patient/family informed of [facility]' ownership interest in [facility], as well as of the fact that they are under no obligation to receive care at this facility.  PASARR submitted to EDS on 03/04/2014 PASARR number received on 03/04/2014  FL2 transmitted to all facilities in geographic area requested by pt/family on  03/04/2014 FL2 transmitted to all facilities within larger geographic area on   Patient informed that his/her managed care company has contracts with or will negotiate with  certain facilities, including the following:     Patient/family informed of bed offers received:  03/05/14 Patient chooses bed at [facility] Physician recommends and patient chooses bed at    Patient to be transferred to  on   Patient to be transferred to facility by  Patient and family notified of transfer on  Name of family member notified:    The following physician request were entered in Epic:   Additional Comments:   Alison Murray, MSW, [place]  Work [phone]

## 2014-03-04 NOTE — Telephone Encounter (Signed)
I called Mr. Michael Keith's cousin, Michael Keith, to check on his understanding of Michael Keith's status.  He confirmed understanding that Michael Keith will likely continue his hospitalization through the Labor Day weekend with a possible discharge next week.  He indicated he has had conversation with Michael Keith re: identification of an alternate to Michael Keith when DC is confirmed.  He confirmed understanding that next Tuesday is [name]'s last radiation treatment, that another chemotherapy treatment is not expected at this point in time.  Michael Orem, RN, BSN, [place] [facility] [place] [phone]

## 2014-03-04 NOTE — Progress Notes (Signed)
Pt did not receive chest vest and neb due to pt in radiation. If pt needs PRN neb there will be one available.

## 2014-03-04 NOTE — Progress Notes (Signed)
NUTRITION FOLLOW UP   INTERVENTION: - Will increase TF Osmotlite 1.5 via PEG to 115 ml/hr 12 hours per night (8pm to 8 am)  This will provide:  2070 kcal, 87 gm protein, 1052 ml free water. -  Patient receiving additional fluid with IV KVO (240 ml daily) and 360 ml per PEG every 8 hours (1080 ml daily). - RD to follow  NUTRITION DIAGNOSIS: Inadequate oral intake related to inability to eat as evidenced by NPO. -continues.  Goal: TF to meet >90% of estimated nutritional needs- met  Monitor:  Weights, labs, TF tolerance  Reason for Assessment: Malnutrition screening tool, home TF, consult for assessment   63 y.o. male  Admitting Dx: Staphylococcus aureus bacteremia with sepsis  ASSESSMENT: Pt with history of esophageal cancer, s/p PEG placement earlier this month, presented to his oncologist for a check up noted to have weak pulse and was sent here. Patient states on questioning that he has not been feeling well for some time. He states that he has had chills and has felt shortness of breath. He has had a cough for probably a month now. He brings up yellow sputum. Patient has been a smoker also and has COPD. He was sent to the ED for further evaluation. He was noted to have on CXR increased infiltrates suggesting a pneumonia.   9/1: Cardiomyopathy EF 15% treatment limited by hypotension, renal insufficiency, COPD.   - Currently on venturi mask - Pt in/out of sleep during visit - Said he was not eating by mouth, only getting TF - Per home medication list, pt was getting Osmolite 1.5 at 115/hr x 10 hours via PEG PTA  - This provided 1725 calories, 72g protein, and 877m free water  - Pt with visible severe wasting in upper arms, hands, and temporal region with moderate wasting in clavicles  9/4:   - Consult for Enteral Nutrition Management received. - Patient stated that he is doing well with the TF. - Patient reported that he was on Jevity 1.5 at nursing center.  Current formula  is equivalent but without fiber. - [facility] - Patient concerned about his low weight.  Weight checked with bed scale but inaccurate weight of 120 lbs. - Discussed with nurse.  Patient tolerating TF well. - BM today.  Diarrhea but this is fairly usual per patient. - Blood sugars 124-375.  Diabetes RN gave recommendations for insulin during TF. - HgbA1C was 6.8%  02/28/14.   Height: Ht Readings from Last 1 Encounters:  02/28/14 5' 6"  (1.676 m)    Weight: Wt Readings from Last 1 Encounters:  02/28/14 104 lb 11.5 oz (47.5 kg)    Ideal Body Weight: 142 lbs   % Ideal Body Weight: 73%  Wt Readings from Last 10 Encounters:  02/28/14 104 lb 11.5 oz (47.5 kg)  02/28/14 103 lb 14.4 oz (47.129 kg)  02/21/14 104 lb 1.6 oz (47.219 kg)  02/15/14 109 lb 1 oz (49.47 kg)  02/07/14 110 lb 14.4 oz (50.304 kg)  02/07/14 110 lb 14.4 oz (50.304 kg)  01/31/14 101 lb 3.2 oz (45.904 kg)  01/27/14 106 lb (48.081 kg)  01/26/14 109 lb (49.442 kg)  01/24/14 104 lb 9.6 oz (47.446 kg)    Usual Body Weight: 110 lbs   % Usual Body Weight: 95%  BMI:  Body mass index is 16.91 kg/(m^2). Underweight  Estimated Nutritional Needs: Kcal: 1700-1900 Protein: 70-90g Fluid: 1.7-1.9L/day   Skin: intact   Diet Order: NPO  EDUCATION NEEDS: -No education needs identified at this time   Intake/Output Summary (Last 24 hours) at 03/04/14 1103 Last data filed at 03/04/14 0600  Gross per 24 hour  Intake   4310 ml  Output    800 ml  Net   3510 ml    Last BM: 8/30  Labs:   Recent Labs Lab 03/02/14 0545 03/03/14 0555 03/03/14 1750 03/04/14 0418  NA 135* 137  --  135*  K 3.9 3.3*  --  3.6*  CL 99 98  --  97  CO2 21 21  --  21  BUN 36* 45*  --  41*  CREATININE 1.41* 1.50*  --  1.28  CALCIUM 8.4 8.9  --  8.9  GLUCOSE 466* 344* 62* 357*    CBG (last 3)   Recent Labs  03/03/14 2340 03/04/14 0348 03/04/14 0836  GLUCAP 275* 304* 375*    Scheduled Meds: .  sodium chloride   Intravenous Once  . aspirin  81 mg Per Tube Daily  . budesonide (PULMICORT) nebulizer solution  0.25 mg Nebulization BID  .  ceFAZolin (ANCEF) IV  2 g Intravenous 3 times per day  . feeding supplement (OSMOLITE 1.5 CAL)  1,000 mL Per Tube Q24H  . folic acid  1 mg Per Tube Daily  . heparin  5,000 Units Subcutaneous 3 times per day  . insulin aspart  0-9 Units Subcutaneous Q4H  . ipratropium  0.5 mg Nebulization Q6H WA  . levalbuterol  1.25 mg Nebulization Q6H WA  . methylPREDNISolone (SOLU-MEDROL) injection  40 mg Intravenous Q6H  . multivitamin with minerals  1 tablet Per Tube Daily  . nicotine  21 mg Transdermal Daily  . sodium chloride  3 mL Intravenous Q12H  . sterile water for irrigation  360 mL Irrigation 3 times per day  . sucralfate  1 g Per Tube TID  . terazosin  1 mg Per Tube QHS  . thiamine  100 mg Per Tube Daily    Continuous Infusions:   Past Medical History  Diagnosis Date  . Chronic pancreatitis     CT scan shows improving pancreatitis  . Nonischemic cardiomyopathy 09/2009    Catheterization, April, 2011, questionable occlusion of the most apical portion of the LAD, LV dysfunction out of proportion to coronary disease  . Chronic systolic CHF (congestive heart failure)     April, 2011 mild troponin elevation at that time  . Mitral regurgitation     mild, echo April 2011  . Tobacco abuse   . Alcohol use     heavy until Jan 2011, patient was counseled concerning alcohol in April 2011  . COPD (chronic obstructive pulmonary disease)   . Fluid overload 03/2010    October, 2011  . Cough Jan 2012    may be from enalapril, January, 2012  . Ejection fraction < 50%     EF 35-40%, echo, April, 2011  . CAD (coronary artery disease)     Catheterization, April, 2011, questionable occlusion of the most apical portion of the LAD, LV dysfunction out of proportion to coronary disease  . Squamous cell esophageal cancer   . Severe protein-calorie malnutrition  01/31/2014    Past Surgical History  Procedure Laterality Date  . Cardiac catheterization  April 2011    questionable occlusion of the most apical portion of the LAD / LV dysfunction out of proportion to coronary disease  . Wrist surgey      left wrist  . Laparoscopic gastrostomy N/A  02/02/2014    Procedure: LAPAROSCOPIC GASTROSTOMY TUBE PLACEMENT;  Surgeon: Pedro Earls, MD;  Location: [facility];  Service: General;  Laterality: N/A;    Antonieta Iba, RD, LDN Clinical Inpatient Dietitian Pager:  [phone] Weekend and after hours pager:  [phone]

## 2014-03-05 DIAGNOSIS — C159 Malignant neoplasm of esophagus, unspecified: Secondary | ICD-10-CM

## 2014-03-05 LAB — BASIC METABOLIC PANEL
ANION GAP: 15 (ref 5–15)
BUN: 41 mg/dL — ABNORMAL HIGH (ref 6–23)
CALCIUM: 9.3 mg/dL (ref 8.4–10.5)
CO2: 23 meq/L (ref 19–32)
CREATININE: 1.14 mg/dL (ref 0.50–1.35)
Chloride: 98 mEq/L (ref 96–112)
GFR calc Af Amer: 77 mL/min — ABNORMAL LOW (ref >90)
GFR, EST NON AFRICAN AMERICAN: 66 mL/min — AB (ref >90)
Glucose, Bld: 308 mg/dL — ABNORMAL HIGH (ref 70–99)
Potassium: 3.7 mEq/L (ref 3.7–5.3)
SODIUM: 136 meq/L — AB (ref 137–147)

## 2014-03-05 LAB — GLUCOSE, CAPILLARY
GLUCOSE-CAPILLARY: 267 mg/dL — AB (ref 70–99)
GLUCOSE-CAPILLARY: 81 mg/dL (ref 70–99)
Glucose-Capillary: 305 mg/dL — ABNORMAL HIGH (ref 70–99)
Glucose-Capillary: 216 mg/dL — ABNORMAL HIGH (ref 70–99)
Glucose-Capillary: 135 mg/dL — ABNORMAL HIGH (ref 70–99)

## 2014-03-05 LAB — CLOSTRIDIUM DIFFICILE BY PCR: Toxigenic C. Difficile by PCR: NEGATIVE

## 2014-03-05 MED ORDER — INSULIN ASPART 100 UNIT/ML ~~LOC~~ SOLN
4.0000 [IU] | Freq: Two times a day (BID) | SUBCUTANEOUS | Status: DC
  Administered 2014-03-06 – 2014-03-08 (×5): 4 [IU] via SUBCUTANEOUS

## 2014-03-05 MED ORDER — PREDNISONE 20 MG PO TABS
20.0000 mg | ORAL_TABLET | Freq: Every day | ORAL | Status: DC
  Administered 2014-03-06: 20 mg
  Filled 2014-03-05 (×3): qty 1

## 2014-03-05 MED ORDER — INSULIN ASPART 100 UNIT/ML ~~LOC~~ SOLN
4.0000 [IU] | Freq: Every day | SUBCUTANEOUS | Status: DC
  Administered 2014-03-05 – 2014-03-07 (×3): 4 [IU] via SUBCUTANEOUS

## 2014-03-05 NOTE — Progress Notes (Signed)
PROGRESS NOTE  Michael Keith KZS:[id number] DOB: 1951/06/26 DOA: 02/28/2014 PCP: Philis Fendt, MD  Assessment/Plan: acute on chronic resp failure with hypoxia: multifactorial.  -Secondary to HCAP, COPD exacerbation  -d/c IV lasix  -continue pulmicort and PRN nebulizer  -continue oxygen supplementation-->now weaned to room air  -appreciate PCCM assistance  -normally on 2L at SNF  -wean steroids  sepsis  -present at time of admission  - due to HCAP and bacteremia  -continue IV abx  Bacteremia-MSSA  -d/c zosyn  -d/c vanco  -03/03/14--start cefazolin  -surveillance blood culture--neg so far  -03/04/14--removal of port-a-cath (placed 01/19/14)  -PICC placement on 03/05/14 -will not be able to perform TEE due to esophageal cancer  -appreciate ID eval  elevated troponin:  -appears to be demand ischemia in nature.  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 Josepha Pigg chemotherapy 01/25/2014  -continue enteral feeds 8PM-6AM--tolerating well  -Laparoscopic gastrostomy tube placement 02/02/2014--Dr. Johnathan Hausen  -discussed goals of care  with patient-->he wants full curative therapy Code Status: Full  Family Communication: no family at bedside  Disposition Plan: SNF  Antibiotics:  Zosyn 02/28/14>>>03/03/14  vanco 02/28/14>>>03/03/14  Cefazolin 03/03/14>>>          Procedures/Studies: Dg Chest 2 View  02/28/2014   CLINICAL DATA:  Fever.  Shortness of breath.  Chest pain.  EXAM: CHEST  2 VIEW  COMPARISON:  01/31/2014  FINDINGS: The patient is rotated to the Right on today's radiograph, reducing diagnostic sensitivity and specificity. Abnormal airspace opacities observed in the right mid lung and right lung base. Indistinct pulmonary vasculature with cardiopericardial silhouette enlarged compared to the prior exam.  Power injectable Port-A-Cath tip:  Lower SVC.  Emphysema noted.  No pleural effusion identified.  IMPRESSION: 1. Newly enlarged cardiopericardial silhouette with indistinct pulmonary vasculature favoring pulmonary venous hypertension/congestive heart failure. However, there is also confluent airspace opacity in the right lower lobe and potentially right middle lobe which could reflect asymmetric edema or pneumonia. 2. Emphysema.   Electronically Signed   By: Sherryl Barters M.D.   On: 02/28/2014 17:44   Ir Removal [employer] Access W/ Port W/o Fl Mod Sed  03/04/2014   CLINICAL DATA:  63 year old with bacteremia and request for Port-A-Cath removal.  EXAM: REMOVAL OF SUBCUTANEOUS PORT-A-CATH  Physician: Stephan Minister. Anselm Pancoast, MD  FLUOROSCOPY TIME:  None  MEDICATIONS: 1 mg Versed, 50 mcg fentanyl.  ANESTHESIA/SEDATION: Patient was monitored by the ICU nurse during the procedure.  PROCEDURE: The procedure was explained to the patient. The risks and benefits of the procedure were discussed and the patient's questions were addressed. Informed consent was obtained from the  patient. The right anterior chest was prepped and draped in sterile fashion. Maximal barrier sterile technique was utilized including caps, mask, sterile gowns, sterile gloves,  sterile drape, hand hygiene and skin antiseptic. The port skin site appeared to be healthy. The old incision area was anesthetized with 1% lidocaine. An incision was made at the old incision site. The port and catheter were easily removed. There was a thin yellow film around the port catheter but no other purulent fluid in the pocket. The pocket was thoroughly irrigated with saline. Pocket was closed using interrupted 2-0 Ethilon sutures. Dressing was placed over the incision site. The port was sent for culture.  FINDINGS: Small amount of yellow material around the catheter. Otherwise, the pocket appeared to be healthy.  COMPLICATIONS: None  IMPRESSION: Successful removal of the right chest Port-A-Cath.  Skin sutures will need to be removed in 10-14 days.   Electronically Signed   By: Markus Daft M.D.   On: 03/04/2014 18:30   Dg Chest Port 1 View  03/02/2014   CLINICAL DATA:  Shortness of breath, infiltrate.  EXAM: PORTABLE CHEST - 1 VIEW  COMPARISON:  03/01/2014  FINDINGS: Degraded by rotation. Prominent cardiomediastinal contours. Aortic tortuosity. Right chest wall Port-A-Cath with tip projecting over the mid SVC. Mild peripheral opacity right lower lung, improving. Mild increased left infrahilar opacity. Background interstitial prominence/ COPD. Small right pleural effusion. No pneumothorax. Osteopenia.  IMPRESSION: Improving aeration along the periphery of the right lung. Mild residual right lung base opacity and mild increased left infrahilar opacity; atelectasis versus infiltrate.  Small right pleural effusion.  Interstitial prominence/COPD.  Mild interstitial edema not excluded.   Electronically Signed   By: Carlos Levering M.D.   On: 03/02/2014 05:57   Dg Chest Port 1 View  03/01/2014   CLINICAL DATA:  Shortness of breath.  EXAM: PORTABLE CHEST - 1 VIEW  COMPARISON:  March 09, 2014.  FINDINGS: Stable cardiomediastinal silhouette. Right internal jugular Port-A-Cath is unchanged in position. No  pneumothorax or significant pleural effusion is noted. Stable airspace opacity is noted laterally in the right midlung concerning for pneumonia or subsegmental atelectasis. Stable central pulmonary vascular congestion is noted as well as probable bilateral pulmonary edema.  IMPRESSION: Stable central pulmonary vascular congestion and pulmonary edema is noted. Stable opacity is noted laterally in right midlung consistent with atelectasis or pneumonia.   Electronically Signed   By: Sabino Dick M.D.   On: 03/01/2014 08:25   Dg Chest Port 1 View  03/01/2014   CLINICAL DATA:  Pneumonia.  EXAM: PORTABLE CHEST - 1 VIEW  COMPARISON:  PA and lateral chest 02/28/2014 and 01/31/2014. PET CT scan 12/21/2013.  FINDINGS: Port-A-Cath remains in place. The lungs are emphysematous. Airspace opacity in the right mid lung zone persists but appears mildly improved. Cardiomegaly and pulmonary vascular congestion are again seen. PEG tube is noted.  IMPRESSION: Airspace opacity right mid lung zone likely due to pneumonia appears mildly improved.  Cardiomegaly and vascular congestion.  Emphysema.   Electronically Signed   By: Inge Rise M.D.   On: 03/01/2014 07:27   Dg Abd 2 Views  02/04/2014   CLINICAL DATA:  Nausea and vomiting  EXAM: ABDOMEN - 2 VIEW  COMPARISON:  None.  FINDINGS: Gastrostomy tube is in place. No free intraperitoneal gas on the decubitus image. Diffuse colonic distension is noted. Phleboliths project over the pelvis.  IMPRESSION: Diffuse colonic distention.  No free intraperitoneal gas.   Electronically Signed   By: Maryclare Bean  M.D.   On: 02/04/2014 11:12         Subjective: Patient denies fevers, chills, headache, chest pain, dyspnea, nausea, vomiting, diarrhea, abdominal pain, dysuria, hematuria   Objective: Filed Vitals:   03/05/14 0000 03/05/14 0329 03/05/14 0340 03/05/14 0800  BP: 115/70 130/77  143/75  Pulse: 82  83 78  Temp:    98.8 F (37.1 C)  TempSrc:    Oral  Resp: 18  23 16     Height:      Weight:      SpO2: 92%  94% 95%    Intake/Output Summary (Last 24 hours) at 03/05/14 1234 Last data filed at 03/05/14 0603  Gross per 24 hour  Intake   1452 ml  Output    600 ml  Net    852 ml   Weight change:  Exam:   General:  Pt is alert, follows commands appropriately, not in acute distress  HEENT: No icterus, No thrush, No neck mass, [place]/AT  Cardiovascular: RRR, S1/S2, no rubs, no gallops  Respiratory: CTA bilaterally, no wheezing, no crackles, no rhonchi  Abdomen: Soft/+BS, non tender, non distended, no guarding  Extremities: No edema, No lymphangitis, No petechiae, No rashes, no synovitis  Data Reviewed: Basic Metabolic Panel:  Recent Labs Lab 03/01/14 0630 03/02/14 0545 03/03/14 0555 03/03/14 1750 03/04/14 0418 03/05/14 0335  NA 135* 135* 137  --  135* 136*  K 4.2 3.9 3.3*  --  3.6* 3.7  CL 100 99 98  --  97 98  CO2 21 21 21   --  21 23  GLUCOSE 154* 466* 344* 62* 357* 308*  BUN 27* 36* 45*  --  41* 41*  CREATININE 1.17 1.41* 1.50*  --  1.28 1.14  CALCIUM 8.3* 8.4 8.9  --  8.9 9.3   Liver Function Tests:  Recent Labs Lab 02/27/14 1620 02/28/14 1636 03/01/14 0630  AST 18 22 65*  ALT 10 11 34  ALKPHOS 68 71 109  BILITOT 0.4 0.6 1.0  PROT 7.2 7.7 7.3  ALBUMIN 3.0* 3.1* 2.6*   No results found for this basename: LIPASE, AMYLASE,  in the last 168 hours No results found for this basename: AMMONIA,  in the last 168 hours CBC:  Recent Labs Lab 02/27/14 1620 02/28/14 1636 02/28/14 1918 03/01/14 0630 03/03/14 0555 03/04/14 0418  WBC 2.7* 6.4 6.4 7.7 6.4 5.2  NEUTROABS 1.6* 5.5  --   --   --   --   HGB 7.5* 7.4* 6.7* 10.3* 9.6* 9.6*  HCT 22.8* 22.2* 19.7* 30.8* 27.5* 28.3*  MCV 89.8 89.2 88.7 86.5 84.4 86.5  PLT 185 185 167 159 157 148*   Cardiac Enzymes:  Recent Labs Lab 02/28/14 1918 03/01/14 0630 03/01/14 1218 03/02/14 1030 03/02/14 1556  TROPONINI 0.55* 0.38* 0.64* <0.30 <0.30   BNP: No components found with  this basename: POCBNP,  CBG:  Recent Labs Lab 03/04/14 1909 03/04/14 2311 03/05/14 0320 03/05/14 0804 03/05/14 1141  GLUCAP 170* 238* 305* 216* 267*    Recent Results (from the past 240 hour(s))  CULTURE, BLOOD (ROUTINE X 2)     Status: None   Collection Time    02/28/14  4:36 PM      Result Value Ref Range Status   Specimen Description BLOOD RIGHT HAND   Final   Special Requests BOTTLES DRAWN AEROBIC AND ANAEROBIC 3 ML   Final   Culture  Setup Time     Final   Value: 02/28/2014 18:52  Performed at [facility]     Final   Value: STAPHYLOCOCCUS AUREUS     Note: SUSCEPTIBILITIES PERFORMED ON PREVIOUS CULTURE WITHIN THE LAST 5 DAYS.     Note: Gram Stain Report Called to,Read Back By and Verified With:  LISA FREY @[id number]  03/01/14 SMIAS     Performed at [facility]   Report Status 03/03/2014 FINAL   Final  CULTURE, BLOOD (ROUTINE X 2)     Status: None   Collection Time    02/28/14  4:36 PM      Result Value Ref Range Status   Specimen Description BLOOD RIGHT PORT   Final   Special Requests BOTTLES DRAWN AEROBIC AND ANAEROBIC 5 ML   Final   Culture  Setup Time     Final   Value: 02/28/2014 18:53     Performed at [facility]   Culture     Final   Value: STAPHYLOCOCCUS AUREUS     Note: RIFAMPIN AND GENTAMICIN SHOULD NOT BE USED AS SINGLE DRUGS FOR TREATMENT OF STAPH INFECTIONS.     Note: Gram Stain Report Called to,Read Back By and Verified With:  Clydene Fake [phone] 03/01/14 SMIAS     Performed at [facility]   Report Status 03/03/2014 FINAL   Final   Organism ID, Bacteria STAPHYLOCOCCUS AUREUS   Final  MRSA PCR SCREENING     Status: None   Collection Time    02/28/14  9:33 PM      Result Value Ref Range Status   MRSA by PCR NEGATIVE  NEGATIVE Final   Comment:            The GeneXpert MRSA Assay (FDA     approved for NASAL specimens     only), is one component of a     comprehensive MRSA colonization     surveillance  program. It is not     intended to diagnose MRSA     infection nor to guide or     monitor treatment for     MRSA infections.  CULTURE, BLOOD (ROUTINE X 2)     Status: None   Collection Time    03/02/14 10:30 AM      Result Value Ref Range Status   Specimen Description BLOOD PAC   Final   Special Requests BOTTLES DRAWN AEROBIC AND ANAEROBIC 10CC   Final   Culture  Setup Time     Final   Value: 03/02/2014 13:24     Performed at [facility]   Culture     Final   Value:        BLOOD CULTURE RECEIVED NO GROWTH TO DATE CULTURE WILL BE HELD FOR 5 DAYS BEFORE ISSUING A FINAL NEGATIVE REPORT     Performed at [facility]   Report Status PENDING   Incomplete  CULTURE, BLOOD (ROUTINE X 2)     Status: None   Collection Time    03/02/14 10:40 AM      Result Value Ref Range Status   Specimen Description BLOOD RIGHT HAND   Final   Special Requests BOTTLES DRAWN AEROBIC AND ANAEROBIC [facility]   Final   Culture  Setup Time     Final   Value: 03/02/2014 13:24     Performed at [facility]   Culture     Final   Value:        BLOOD CULTURE RECEIVED NO GROWTH TO DATE CULTURE  WILL BE HELD FOR 5 DAYS BEFORE ISSUING A FINAL NEGATIVE REPORT     Performed at [facility]   Report Status PENDING   Incomplete     Scheduled Meds: . sodium chloride   Intravenous Once  . aspirin  81 mg Per Tube Daily  . budesonide (PULMICORT) nebulizer solution  0.25 mg Nebulization BID  .  ceFAZolin (ANCEF) IV  2 g Intravenous 3 times per day  . feeding supplement (OSMOLITE 1.5 CAL)  1,000 mL Per Tube Q24H  . folic acid  1 mg Per Tube Daily  . heparin  5,000 Units Subcutaneous 3 times per day  . insulin aspart  0-9 Units Subcutaneous Q4H  . insulin aspart  4 Units Subcutaneous Q2000  . [START ON 03/06/2014] insulin aspart  4 Units Subcutaneous q12n4p  . ipratropium  0.5 mg Nebulization Q6H WA  . levalbuterol  1.25 mg Nebulization Q6H WA  . methylPREDNISolone (SOLU-MEDROL) injection  40 mg  Intravenous Daily  . multivitamin with minerals  1 tablet Per Tube Daily  . nicotine  21 mg Transdermal Daily  . sodium chloride  3 mL Intravenous Q12H  . sterile water for irrigation  360 mL Irrigation 3 times per day  . sucralfate  1 g Per Tube TID  . terazosin  1 mg Per Tube QHS  . thiamine  100 mg Per Tube Daily   Continuous Infusions:    Michael Wojnar, DO  [facility] Pager [phone]  If 7PM-7AM, please contact night-coverage [email or web address] Password [email or web address] 03/05/2014, 12:34 PM   LOS: 5 days

## 2014-03-05 NOTE — Progress Notes (Signed)
Spoke with pt regarding PICC placement.  Pt uncertain of procedure.  States he prefers to wait until tomorrow so he can d/w doctor the chemo and radiation plans and antibiotic therapy.  Stated he did not think they were going to proceed with chemo, only  Radiation.  Also clarification of length of time for ABT.  Due to uncertainty, will recheck with pt 03/06/2014 for consent. Maudie Mercury, RN aware.

## 2014-03-05 NOTE — Clinical Social Work Note (Signed)
SNF bed offers given.  Liz Beach MSW, [place], [place], [phone]

## 2014-03-05 NOTE — Progress Notes (Signed)
Pt had large watery stool.  Not sent for cdiff protocol because pt states he has had watery stools ever since he has been on tube feedings, even before hospitalization.

## 2014-03-05 NOTE — Progress Notes (Signed)
Physical Therapy Treatment Patient Details Name: Michael Keith MRN: [id number] DOB: September 03, 1950 Today's Date: 03/05/2014    History of Present Illness  Michael Keith is a 63 y.o. male with history of esophageal cancer presented to his oncologist for a check up noted to have weak pulse and was sent to ED on 02/28/14. Currently   at [facility] for rehab after previous hospitalization. Pt found with  lung infiltrates. Patient  has a PEG.    PT Comments    Pt in bed with RN in room giving meds.  Assisted pt OOB to amb in hallway required increased time and monitoring RA sats which avg 93% and HR 97.  Pt declined use of walker and held to IV pole.  Mild unsteady gait with increased unsteadiness with increased distance.  Assisted pt back to bed per pt request.   Follow Up Recommendations        Equipment Recommendations       Recommendations for Other Services       Precautions / Restrictions Precautions Precautions: Fall Precaution Comments: monitor sats/NPO Restrictions Weight Bearing Restrictions: No    Mobility  Bed Mobility Overal bed mobility: Needs Assistance Bed Mobility: Supine to Sit;Sit to Supine       Sit to supine: Supervision   General bed mobility comments: increased time  Transfers Overall transfer level: Needs assistance Equipment used: None Transfers: Sit to/from Stand Sit to Stand: Min guard         General transfer comment:  increased time and one VC for hand placement with stand to sit  Ambulation/Gait Ambulation/Gait assistance: Min guard;Min assist Ambulation Distance (Feet): 350 Feet Assistive device: None (held to IV pole second half due to increased fatigue and gait instability) Gait Pattern/deviations: Step-to pattern;Step-through pattern;Shuffle;Staggering left;Staggering right Gait velocity: decreased   General Gait Details: mild unsteady gait with decreased balance with increased gait distance and c/o fatigue.  Pt declined any suggestion to use  walker.     Stairs            Wheelchair Mobility    Modified Rankin (Stroke Patients Only)       Balance                                    Cognition                            Exercises      General Comments        Pertinent Vitals/Pain      Home Living                      Prior Function            PT Goals (current goals can now be found in the care plan section) Progress towards PT goals: Progressing toward goals    Frequency       PT Plan      Co-evaluation             End of Session           Time: 1000-1025 PT Time Calculation (min): 25 min  Charges:  $Gait Training: 8-22 mins $Therapeutic Activity: 8-22 mins                    G Codes:      Rica Koyanagi  PTA [facility]  Acute  Rehab Pager      [phone]

## 2014-03-06 LAB — BASIC METABOLIC PANEL
Anion gap: 13 (ref 5–15)
BUN: 43 mg/dL — AB (ref 6–23)
CO2: 26 mEq/L (ref 19–32)
CREATININE: 1.08 mg/dL (ref 0.50–1.35)
Calcium: 9.7 mg/dL (ref 8.4–10.5)
Chloride: 106 mEq/L (ref 96–112)
GFR calc Af Amer: 82 mL/min — ABNORMAL LOW (ref >90)
GFR, EST NON AFRICAN AMERICAN: 71 mL/min — AB (ref >90)
GLUCOSE: 205 mg/dL — AB (ref 70–99)
POTASSIUM: 3.4 meq/L — AB (ref 3.7–5.3)
Sodium: 145 mEq/L (ref 137–147)

## 2014-03-06 LAB — GLUCOSE, CAPILLARY
GLUCOSE-CAPILLARY: 111 mg/dL — AB (ref 70–99)
GLUCOSE-CAPILLARY: 185 mg/dL — AB (ref 70–99)
GLUCOSE-CAPILLARY: 217 mg/dL — AB (ref 70–99)
Glucose-Capillary: 199 mg/dL — ABNORMAL HIGH (ref 70–99)
Glucose-Capillary: 130 mg/dL — ABNORMAL HIGH (ref 70–99)
Glucose-Capillary: 153 mg/dL — ABNORMAL HIGH (ref 70–99)

## 2014-03-06 LAB — MAGNESIUM: Magnesium: 1.9 mg/dL (ref 1.5–2.5)

## 2014-03-06 MED ORDER — SODIUM CHLORIDE 0.9 % IJ SOLN
10.0000 mL | INTRAMUSCULAR | Status: DC | PRN
  Administered 2014-03-07 – 2014-03-08 (×2): 10 mL

## 2014-03-06 NOTE — Progress Notes (Signed)
Peripherally Inserted Central Catheter/Midline Placement  The IV Nurse has discussed with the patient and/or persons authorized to consent for the patient, the purpose of this procedure and the potential benefits and risks involved with this procedure.  The benefits include less needle sticks, lab draws from the catheter and patient may be discharged home with the catheter.  Risks include, but not limited to, infection, bleeding, blood clot (thrombus formation), and puncture of an artery; nerve damage and irregular heat beat.  Alternatives to this procedure were also discussed.  Consent obtained by Jake Samples, RN  PICC/Midline Placement Documentation        Jesaiah Fabiano, Nicolette Bang 03/06/2014, 11:57 AM

## 2014-03-06 NOTE — Progress Notes (Signed)
Pt has a sore on his nose please use caution when putting mask on.

## 2014-03-06 NOTE — Progress Notes (Signed)
PROGRESS NOTE  Michael Keith VFI:[id number] DOB: 08-21-1950 DOA: 02/28/2014 PCP: Philis Fendt, MD  Assessment/Plan: acute on chronic resp failure with hypoxia: multifactorial.  -Secondary to HCAP, COPD exacerbation  -d/c IV lasix  -continue pulmicort and PRN nebulizer  -continue oxygen supplementation-->now weaned to room air  -appreciate PCCM assistance  -wean steroids  sepsis  -Resolved -present at time of admission  - due to HCAP and bacteremia  -continue IV abx  [place] is the patient's Port-A-Cath -d/c zosyn  -d/c vanco  -03/03/14--started cefazolin  -surveillance blood culture--neg so far  -03/04/14--removal of port-a-cath (placed 01/19/14)  -cath tip culture-->S.aureus -PICC placement on 03/06/14  -will not be able to perform TEE due to esophageal cancer  -appreciate ID eval  elevated troponin:  -appears to be demand ischemia in nature.  -check 2-D echo--EF 15%, global HK  -patient no complaining of CP  -will continue monitoring on telemetry  -appreciate cardiology  Elevated proBNP/chronic systolic CHF  -appears euvolemic today  -in the setting of CKD and sepsis, EF 15%  -strict intake and output  -IV lasix started initially-->stopped and remained clinically stable -daily BMET  -2-D echo as above  Diabetes mellitus type 2  -02/28/2014 hemoglobin A1c 6.8  -Given the patient's clinical condition--I will allow more liberal glycemic control  -add novolog 4 units @ 2000, 2400, 0400 during TF  -Enteral feeds run from 2000 to 0600 daily  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 Josepha Pigg chemotherapy 01/25/2014  -continue enteral feeds 8PM-6AM--tolerating well    -Laparoscopic gastrostomy tube placement 02/02/2014--Dr. Johnathan Hausen  -discussed goals of care with patient-->he wants full curative therapy  -Will allow for ice chips Code Status: Full  Family Communication: nephew updated at bedside Disposition Plan: SNF  Antibiotics:  Zosyn 02/28/14>>>03/03/14  vanco 02/28/14>>>03/03/14  Cefazolin 03/03/14>>>         Procedures/Studies: Dg Chest 2 View  02/28/2014   CLINICAL DATA:  Fever.  Shortness of breath.  Chest pain.  EXAM: CHEST  2 VIEW  COMPARISON:  01/31/2014  FINDINGS: The patient is rotated to the Right on today's radiograph, reducing diagnostic sensitivity and specificity. Abnormal airspace opacities observed in the right mid lung and right lung base. Indistinct pulmonary vasculature with cardiopericardial silhouette enlarged compared to the prior exam.  Power injectable Port-A-Cath tip:  Lower SVC.  Emphysema noted.  No pleural effusion identified.  IMPRESSION: 1. Newly enlarged cardiopericardial silhouette with indistinct pulmonary vasculature favoring pulmonary venous hypertension/congestive heart failure. However, there is also confluent airspace opacity in the right lower lobe and potentially right middle lobe which could reflect asymmetric edema or pneumonia. 2. Emphysema.   Electronically Signed   By: Sherryl Barters M.D.   On: 02/28/2014 17:44   Ir Removal [employer] Access W/ Port W/o Fl Mod Sed  03/04/2014   CLINICAL DATA:  63 year old with bacteremia and request for Port-A-Cath removal.  EXAM: REMOVAL OF SUBCUTANEOUS PORT-A-CATH  Physician: Stephan Minister. Anselm Pancoast, MD  FLUOROSCOPY TIME:  None  MEDICATIONS: 1 mg Versed, 50 mcg fentanyl.  ANESTHESIA/SEDATION: Patient was monitored by the ICU nurse during the procedure.  PROCEDURE: The procedure was explained to the patient. The risks and benefits of the procedure  were discussed and the patient's questions were addressed. Informed consent was obtained from the patient. The right anterior chest was prepped  and draped in sterile fashion. Maximal barrier sterile technique was utilized including caps, mask, sterile gowns, sterile gloves, sterile drape, hand hygiene and skin antiseptic. The port skin site appeared to be healthy. The old incision area was anesthetized with 1% lidocaine. An incision was made at the old incision site. The port and catheter were easily removed. There was a thin yellow film around the port catheter but no other purulent fluid in the pocket. The pocket was thoroughly irrigated with saline. Pocket was closed using interrupted 2-0 Ethilon sutures. Dressing was placed over the incision site. The port was sent for culture.  FINDINGS: Small amount of yellow material around the catheter. Otherwise, the pocket appeared to be healthy.  COMPLICATIONS: None  IMPRESSION: Successful removal of the right chest Port-A-Cath.  Skin sutures will need to be removed in 10-14 days.   Electronically Signed   By: Markus Daft M.D.   On: 03/04/2014 18:30   Dg Chest Port 1 View  03/02/2014   CLINICAL DATA:  Shortness of breath, infiltrate.  EXAM: PORTABLE CHEST - 1 VIEW  COMPARISON:  03/01/2014  FINDINGS: Degraded by rotation. Prominent cardiomediastinal contours. Aortic tortuosity. Right chest wall Port-A-Cath with tip projecting over the mid SVC. Mild peripheral opacity right lower lung, improving. Mild increased left infrahilar opacity. Background interstitial prominence/ COPD. Small right pleural effusion. No pneumothorax. Osteopenia.  IMPRESSION: Improving aeration along the periphery of the right lung. Mild residual right lung base opacity and mild increased left infrahilar opacity; atelectasis versus infiltrate.  Small right pleural effusion.  Interstitial prominence/COPD.  Mild interstitial edema not excluded.   Electronically Signed   By: Carlos Levering M.D.   On: 03/02/2014 05:57   Dg Chest Port 1 View  03/01/2014   CLINICAL DATA:  Shortness of breath.  EXAM: PORTABLE CHEST - 1 VIEW  COMPARISON:   March 09, 2014.  FINDINGS: Stable cardiomediastinal silhouette. Right internal jugular Port-A-Cath is unchanged in position. No pneumothorax or significant pleural effusion is noted. Stable airspace opacity is noted laterally in the right midlung concerning for pneumonia or subsegmental atelectasis. Stable central pulmonary vascular congestion is noted as well as probable bilateral pulmonary edema.  IMPRESSION: Stable central pulmonary vascular congestion and pulmonary edema is noted. Stable opacity is noted laterally in right midlung consistent with atelectasis or pneumonia.   Electronically Signed   By: Sabino Dick M.D.   On: 03/01/2014 08:25   Dg Chest Port 1 View  03/01/2014   CLINICAL DATA:  Pneumonia.  EXAM: PORTABLE CHEST - 1 VIEW  COMPARISON:  PA and lateral chest 02/28/2014 and 01/31/2014. PET CT scan 12/21/2013.  FINDINGS: Port-A-Cath remains in place. The lungs are emphysematous. Airspace opacity in the right mid lung zone persists but appears mildly improved. Cardiomegaly and pulmonary vascular congestion are again seen. PEG tube is noted.  IMPRESSION: Airspace opacity right mid lung zone likely due to pneumonia appears mildly improved.  Cardiomegaly and vascular congestion.  Emphysema.   Electronically Signed   By: Inge Rise M.D.   On: 03/01/2014 07:27         Subjective: Patient denies fevers, chills, headache, chest pain, dyspnea, nausea, vomiting, diarrhea, abdominal pain, dysuria, hematuria   Objective: Filed Vitals:   03/06/14 0556 03/06/14 0832 03/06/14 1337 03/06/14 1416  BP: 140/80  137/92   Pulse: 87  79   Temp: 98.3 F (36.8 C)  98.3 F (36.8 C)   TempSrc: Oral  Oral   Resp: 18  20   Height:      Weight:      SpO2: 99% 95% 100% 98%    Intake/Output Summary (Last 24 hours) at 03/06/14 1612 Last data filed at 03/06/14 1338  Gross per 24 hour  Intake    263 ml  Output    775 ml  Net   -512 ml   Weight change:  Exam:   General:  Pt is alert,  follows commands appropriately, not in acute distress  HEENT: No icterus, No thrush,  [place]/AT  Cardiovascular: RRR, S1/S2, no rubs, no gallops  Respiratory:  L-CTA, right basilar crackles,  no wheezing, no crackles, no rhonchi  Abdomen: Soft/+BS, non tender, non distended, no guarding  Extremities: trace LE edema, No lymphangitis, No petechiae, No rashes, no synovitis  Data Reviewed: Basic Metabolic Panel:  Recent Labs Lab 03/02/14 0545 03/03/14 0555 03/03/14 1750 03/04/14 0418 03/05/14 0335 03/06/14 0519  NA 135* 137  --  135* 136* 145  K 3.9 3.3*  --  3.6* 3.7 3.4*  CL 99 98  --  97 98 106  CO2 21 21  --  21 23 26   GLUCOSE 466* 344* 62* 357* 308* 205*  BUN 36* 45*  --  41* 41* 43*  CREATININE 1.41* 1.50*  --  1.28 1.14 1.08  CALCIUM 8.4 8.9  --  8.9 9.3 9.7  MG  --   --   --   --   --  1.9   Liver Function Tests:  Recent Labs Lab 02/27/14 1620 02/28/14 1636 03/01/14 0630  AST 18 22 65*  ALT 10 11 34  ALKPHOS 68 71 109  BILITOT 0.4 0.6 1.0  PROT 7.2 7.7 7.3  ALBUMIN 3.0* 3.1* 2.6*   No results found for this basename: LIPASE, AMYLASE,  in the last 168 hours No results found for this basename: AMMONIA,  in the last 168 hours CBC:  Recent Labs Lab 02/27/14 1620 02/28/14 1636 02/28/14 1918 03/01/14 0630 03/03/14 0555 03/04/14 0418  WBC 2.7* 6.4 6.4 7.7 6.4 5.2  NEUTROABS 1.6* 5.5  --   --   --   --   HGB 7.5* 7.4* 6.7* 10.3* 9.6* 9.6*  HCT 22.8* 22.2* 19.7* 30.8* 27.5* 28.3*  MCV 89.8 89.2 88.7 86.5 84.4 86.5  PLT 185 185 167 159 157 148*   Cardiac Enzymes:  Recent Labs Lab 02/28/14 1918 03/01/14 0630 03/01/14 1218 03/02/14 1030 03/02/14 1556  TROPONINI 0.55* 0.38* 0.64* <0.30 <0.30   BNP: No components found with this basename: POCBNP,  CBG:  Recent Labs Lab 03/05/14 2024 03/06/14 0007 03/06/14 0442 03/06/14 0717 03/06/14 1216  GLUCAP 135* 217* 199* 130* 185*    Recent Results (from the past 240 hour(s))  CULTURE, BLOOD (ROUTINE  X 2)     Status: None   Collection Time    02/28/14  4:36 PM      Result Value Ref Range Status   Specimen Description BLOOD RIGHT HAND   Final   Special Requests BOTTLES DRAWN AEROBIC AND ANAEROBIC 3 ML   Final   Culture  Setup Time     Final   Value: 02/28/2014 18:52     Performed at [facility]   Culture     Final   Value: STAPHYLOCOCCUS AUREUS     Note: SUSCEPTIBILITIES PERFORMED ON PREVIOUS CULTURE WITHIN THE LAST 5 DAYS.     Note: Gram  Stain Report Called to,Read Back By and Verified With:  LISA FREY @[id number]  03/01/14 SMIAS     Performed at [facility]   Report Status 03/03/2014 FINAL   Final  CULTURE, BLOOD (ROUTINE X 2)     Status: None   Collection Time    02/28/14  4:36 PM      Result Value Ref Range Status   Specimen Description BLOOD RIGHT PORT   Final   Special Requests BOTTLES DRAWN AEROBIC AND ANAEROBIC 5 ML   Final   Culture  Setup Time     Final   Value: 02/28/2014 18:53     Performed at [facility]   Culture     Final   Value: STAPHYLOCOCCUS AUREUS     Note: RIFAMPIN AND GENTAMICIN SHOULD NOT BE USED AS SINGLE DRUGS FOR TREATMENT OF STAPH INFECTIONS.     Note: Gram Stain Report Called to,Read Back By and Verified With:  Clydene Fake [phone] 03/01/14 SMIAS     Performed at [facility]   Report Status 03/03/2014 FINAL   Final   Organism ID, Bacteria STAPHYLOCOCCUS AUREUS   Final  MRSA PCR SCREENING     Status: None   Collection Time    02/28/14  9:33 PM      Result Value Ref Range Status   MRSA by PCR NEGATIVE  NEGATIVE Final   Comment:            The GeneXpert MRSA Assay (FDA     approved for NASAL specimens     only), is one component of a     comprehensive MRSA colonization     surveillance program. It is not     intended to diagnose MRSA     infection nor to guide or     monitor treatment for     MRSA infections.  CULTURE, BLOOD (ROUTINE X 2)     Status: None   Collection Time    03/02/14 10:30 AM      Result Value  Ref Range Status   Specimen Description BLOOD PAC   Final   Special Requests BOTTLES DRAWN AEROBIC AND ANAEROBIC 10CC   Final   Culture  Setup Time     Final   Value: 03/02/2014 13:24     Performed at [facility]   Culture     Final   Value:        BLOOD CULTURE RECEIVED NO GROWTH TO DATE CULTURE WILL BE HELD FOR 5 DAYS BEFORE ISSUING A FINAL NEGATIVE REPORT     Performed at [facility]   Report Status PENDING   Incomplete  CULTURE, BLOOD (ROUTINE X 2)     Status: None   Collection Time    03/02/14 10:40 AM      Result Value Ref Range Status   Specimen Description BLOOD RIGHT HAND   Final   Special Requests BOTTLES DRAWN AEROBIC AND ANAEROBIC 6CC   Final   Culture  Setup Time     Final   Value: 03/02/2014 13:24     Performed at [facility]   Culture     Final   Value:        BLOOD CULTURE RECEIVED NO GROWTH TO DATE CULTURE WILL BE HELD FOR 5 DAYS BEFORE ISSUING A FINAL NEGATIVE REPORT     Performed at [facility]   Report Status PENDING   Incomplete  CATH TIP CULTURE     Status: None  Collection Time    03/04/14  5:52 PM      Result Value Ref Range Status   Specimen Description CATH TIP   Final   Special Requests Normal   Final   Culture     Final   Value: >100 COLONIES STAPHYLOCOCCUS AUREUS     Performed at [facility]   Report Status PENDING   Incomplete  CLOSTRIDIUM DIFFICILE BY PCR     Status: None   Collection Time    03/05/14  3:58 PM      Result Value Ref Range Status   C difficile by pcr NEGATIVE  NEGATIVE Final   Comment: Performed at [facility]     Scheduled Meds: . sodium chloride   Intravenous Once  . aspirin  81 mg Per Tube Daily  . budesonide (PULMICORT) nebulizer solution  0.25 mg Nebulization BID  .  ceFAZolin (ANCEF) IV  2 g Intravenous 3 times per day  . feeding supplement (OSMOLITE 1.5 CAL)  1,000 mL Per Tube Q24H  . folic acid  1 mg Per Tube Daily  . heparin  5,000 Units Subcutaneous 3 times per  day  . insulin aspart  0-9 Units Subcutaneous Q4H  . insulin aspart  4 Units Subcutaneous Q2000  . insulin aspart  4 Units Subcutaneous q12n4p  . ipratropium  0.5 mg Nebulization Q6H WA  . levalbuterol  1.25 mg Nebulization Q6H WA  . multivitamin with minerals  1 tablet Per Tube Daily  . nicotine  21 mg Transdermal Daily  . predniSONE  20 mg Per Tube Q breakfast  . sodium chloride  3 mL Intravenous Q12H  . sterile water for irrigation  360 mL Irrigation 3 times per day  . sucralfate  1 g Per Tube TID  . terazosin  1 mg Per Tube QHS  . thiamine  100 mg Per Tube Daily   Continuous Infusions:    Lilburn Straw, DO  [facility] Pager [phone]  If 7PM-7AM, please contact night-coverage [email or web address] Password [email or web address] 03/06/2014, 4:12 PM   LOS: 6 days

## 2014-03-06 NOTE — Progress Notes (Signed)
ANTIBIOTIC CONSULT NOTE - Follow Up  Pharmacy Consult for Cefazolin Indication: MSSA bacteremia (suspected CRBSI)  No Known Allergies  Patient Measurements: Height: 5\' 6"  (167.6 cm) Weight: 104 lb 11.5 oz (47.5 kg) IBW/kg (Calculated) : 63.8  Vital Signs: Temp: 98.3 F (36.8 C) (09/06 0556) Temp src: Oral (09/06 0556) BP: 140/80 mmHg (09/06 0556) Pulse Rate: 87 (09/06 0556) Intake/Output from previous day: 09/05 0701 - 09/06 0700 In: 955.5 [I.V.:3; NG/GT:402.5; IV Piggyback:100] Out: 475 [Urine:475] Intake/Output from this shift: Total I/O In: 0  Out: 300 [Urine:300]  Labs:  Recent Labs  03/04/14 0418 03/05/14 0335 03/06/14 0519  WBC 5.2  --   --   HGB 9.6*  --   --   PLT 148*  --   --   CREATININE 1.28 1.14 1.08   Estimated Creatinine Clearance: 46.4 ml/min (by C-G formula based on Cr of 1.08). No results found for this basename: VANCOTROUGH, Corlis Leak, VANCORANDOM, [place], GENTPEAK, GENTRANDOM, TOBRATROUGH, TOBRAPEAK, TOBRARND, AMIKACINPEAK, AMIKACINTROU, AMIKACIN,  in the last 72 hours   Microbiology: Recent Results (from the past 720 hour(s))  CULTURE, BLOOD (ROUTINE X 2)     Status: None   Collection Time    02/28/14  4:36 PM      Result Value Ref Range Status   Specimen Description BLOOD RIGHT HAND   Final   Special Requests BOTTLES DRAWN AEROBIC AND ANAEROBIC 3 ML   Final   Culture  Setup Time     Final   Value: 02/28/2014 18:52     Performed at [facility]   Culture     Final   Value: STAPHYLOCOCCUS AUREUS     Note: SUSCEPTIBILITIES PERFORMED ON PREVIOUS CULTURE WITHIN THE LAST 5 DAYS.     Note: Gram Stain Report Called to,Read Back By and Verified With:  LISA FREY @[id number]  03/01/14 SMIAS     Performed at [facility]   Report Status 03/03/2014 FINAL   Final  CULTURE, BLOOD (ROUTINE X 2)     Status: None   Collection Time    02/28/14  4:36 PM      Result Value Ref Range Status   Specimen Description BLOOD RIGHT PORT   Final   Special Requests BOTTLES DRAWN AEROBIC AND ANAEROBIC 5 ML   Final   Culture  Setup Time     Final   Value: 02/28/2014 18:53     Performed at [facility]   Culture     Final   Value: STAPHYLOCOCCUS AUREUS     Note: RIFAMPIN AND GENTAMICIN SHOULD NOT BE USED AS SINGLE DRUGS FOR TREATMENT OF STAPH INFECTIONS.     Note: Gram Stain Report Called to,Read Back By and Verified With:  Clydene Fake [phone] 03/01/14 SMIAS     Performed at [facility]   Report Status 03/03/2014 FINAL   Final   Organism ID, Bacteria STAPHYLOCOCCUS AUREUS   Final  MRSA PCR SCREENING     Status: None   Collection Time    02/28/14  9:33 PM      Result Value Ref Range Status   MRSA by PCR NEGATIVE  NEGATIVE Final   Comment:            The GeneXpert MRSA Assay (FDA     approved for NASAL specimens     only), is one component of a     comprehensive MRSA colonization     surveillance program. It is not     intended to diagnose  MRSA     infection nor to guide or     monitor treatment for     MRSA infections.  CULTURE, BLOOD (ROUTINE X 2)     Status: None   Collection Time    03/02/14 10:30 AM      Result Value Ref Range Status   Specimen Description BLOOD PAC   Final   Special Requests BOTTLES DRAWN AEROBIC AND ANAEROBIC 10CC   Final   Culture  Setup Time     Final   Value: 03/02/2014 13:24     Performed at [facility]   Culture     Final   Value:        BLOOD CULTURE RECEIVED NO GROWTH TO DATE CULTURE WILL BE HELD FOR 5 DAYS BEFORE ISSUING A FINAL NEGATIVE REPORT     Performed at [facility]   Report Status PENDING   Incomplete  CULTURE, BLOOD (ROUTINE X 2)     Status: None   Collection Time    03/02/14 10:40 AM      Result Value Ref Range Status   Specimen Description BLOOD RIGHT HAND   Final   Special Requests BOTTLES DRAWN AEROBIC AND ANAEROBIC 6CC   Final   Culture  Setup Time     Final   Value: 03/02/2014 13:24     Performed at [facility]   Culture      Final   Value:        BLOOD CULTURE RECEIVED NO GROWTH TO DATE CULTURE WILL BE HELD FOR 5 DAYS BEFORE ISSUING A FINAL NEGATIVE REPORT     Performed at [facility]   Report Status PENDING   Incomplete  CLOSTRIDIUM DIFFICILE BY PCR     Status: None   Collection Time    03/05/14  3:58 PM      Result Value Ref Range Status   C difficile by pcr NEGATIVE  NEGATIVE Final   Comment: Performed at [facility]    Assessment: 63 yo male with esophageal cancer on chemotherapy and radiation who presents from [facility] with weak pulse. CXR shows possible PNA. Patient was recently admitted 8/3-8/10 for dehydration. Blood cultures reveals MSSA, 1 was a peripheral culture, 2nd was from port. Pharmacy consulted to dose vancomycin and zosyn for PNA in  immunocompromised patient.  On 9/3, orders to narrow antibiotics to cefazolin for MSSA bactermia. TTE performed, TEE will not likely be possible d/t esophageal cancer. Planning 4 weeks total per ID recommendations.  8/31 >> Vancomycin >> 9/3 8/31 >> Zosyn >> 9/3 9/03 >> cefazolin  Tmax: Afebrile WBCs: WNL Renal: ARF resolving, SCr down 1.08, CrCl 46 ml/min  8/31 blood x2: MSSA 9/02 blood x2: NGTD 9/04 cath tip: sent 9/05 C.diff: neg  Goal of Therapy:  Eradication of infection Appropriate dose per renal function  Plan:  Continue Cefazolin 2gm IV q8h No further dosing adjustments anticipated  Peggyann Juba, PharmD, BCPS Pager: [phone] 03/06/2014,9:39 AM

## 2014-03-06 NOTE — Progress Notes (Signed)
Wants to try chest vest on next tx. Needs a size M vest.

## 2014-03-07 LAB — GLUCOSE, CAPILLARY
GLUCOSE-CAPILLARY: 87 mg/dL (ref 70–99)
GLUCOSE-CAPILLARY: 140 mg/dL — AB (ref 70–99)
Glucose-Capillary: 124 mg/dL — ABNORMAL HIGH (ref 70–99)
Glucose-Capillary: 133 mg/dL — ABNORMAL HIGH (ref 70–99)
Glucose-Capillary: 140 mg/dL — ABNORMAL HIGH (ref 70–99)
Glucose-Capillary: 215 mg/dL — ABNORMAL HIGH (ref 70–99)

## 2014-03-07 LAB — BASIC METABOLIC PANEL WITH GFR
Anion gap: 12 (ref 5–15)
BUN: 34 mg/dL — ABNORMAL HIGH (ref 6–23)
CO2: 27 meq/L (ref 19–32)
Calcium: 9.1 mg/dL (ref 8.4–10.5)
Chloride: 102 meq/L (ref 96–112)
Creatinine, Ser: 0.95 mg/dL (ref 0.50–1.35)
GFR calc Af Amer: >90 mL/min
GFR calc non Af Amer: 86 mL/min — ABNORMAL LOW
Glucose, Bld: 98 mg/dL (ref 70–99)
Potassium: 3.1 meq/L — ABNORMAL LOW (ref 3.7–5.3)
Sodium: 141 meq/L (ref 137–147)

## 2014-03-07 LAB — CBC
HEMATOCRIT: 31.1 % — AB (ref 39.0–52.0)
Hemoglobin: 10.2 g/dL — ABNORMAL LOW (ref 13.0–17.0)
MCH: 29.1 pg (ref 26.0–34.0)
MCHC: 32.8 g/dL (ref 30.0–36.0)
MCV: 88.9 fL (ref 78.0–100.0)
Platelets: 158 10*3/uL (ref 150–400)
RBC: 3.5 MIL/uL — AB (ref 4.22–5.81)
RDW: 16 % — ABNORMAL HIGH (ref 11.5–15.5)
WBC: 4.4 10*3/uL (ref 4.0–10.5)

## 2014-03-07 MED ORDER — IPRATROPIUM BROMIDE 0.02 % IN SOLN
0.5000 mg | Freq: Two times a day (BID) | RESPIRATORY_TRACT | Status: DC
  Administered 2014-03-08: 0.5 mg via RESPIRATORY_TRACT
  Filled 2014-03-07: qty 2.5

## 2014-03-07 MED ORDER — PREDNISONE 10 MG PO TABS
10.0000 mg | ORAL_TABLET | Freq: Every day | ORAL | Status: AC
  Administered 2014-03-07: 10 mg
  Filled 2014-03-07: qty 1

## 2014-03-07 MED ORDER — LEVALBUTEROL HCL 1.25 MG/0.5ML IN NEBU
1.2500 mg | INHALATION_SOLUTION | Freq: Two times a day (BID) | RESPIRATORY_TRACT | Status: DC
  Administered 2014-03-08: 1.25 mg via RESPIRATORY_TRACT
  Filled 2014-03-07 (×3): qty 0.5

## 2014-03-07 NOTE — Progress Notes (Signed)
Patient complaining of sore at the bottom of his nose. He is also complaining about sores on the inside of his body that is coming from his breathing treatments. RN aware.

## 2014-03-07 NOTE — Progress Notes (Signed)
PROGRESS NOTE  Michael Keith OVZ:[id number] DOB: 08-13-1950 DOA: 02/28/2014 PCP: Philis Fendt, MD  Assessment/Plan: acute on chronic resp failure with hypoxia: multifactorial.  -Secondary to HCAP, COPD exacerbation  -d/c IV lasix  -continue pulmicort and PRN nebulizer  -continue oxygen supplementation-->now weaned to room air  -appreciate PCCM assistance  -wean steroids to off sepsis  -Resolved  -present at time of admission  - due to HCAP and bacteremia  -continue IV abx  [place] is the patient's Port-A-Cath  -d/c zosyn  -d/c vanco  -03/03/14--started cefazolin  -surveillance blood culture--neg so far  -03/04/14--removal of port-a-cath (placed 01/19/14)  -cath tip culture-->S.aureus  -PICC placement on 03/06/14  -will not be able to perform TEE due to esophageal cancer  -appreciate ID eval  elevated troponin:  -appears to be demand ischemia in nature.  -check 2-D echo--EF 15%, global HK  -patient no complaining of CP  -will continue monitoring on telemetry  -appreciate cardiology  Elevated proBNP/chronic systolic CHF  -appears euvolemic today  -in the setting of CKD and sepsis, EF 15%  -strict intake and output  -IV lasix started initially-->stopped and remained clinically stable  -daily BMET  -2-D echo as above  Diabetes mellitus type 2  -02/28/2014 hemoglobin A1c 6.8  -Given the patient's clinical condition--I will allow more liberal glycemic control  -add novolog 4 units @ 2000, 2400, 0400 during TF  -Enteral feeds run from 2000 to 0600 daily  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 Josepha Pigg chemotherapy 01/25/2014  -continue enteral feeds  8PM-6AM--tolerating well  -Laparoscopic gastrostomy tube placement 02/02/2014--Dr. Johnathan Hausen  -discussed goals of care with patient-->he wants full curative therapy  -Will allow for ice chips  Code Status: Full  Family Communication: nephew updated at bedside  Disposition Plan: [facility] on 03/08/14  Antibiotics:  Zosyn 02/28/14>>>03/03/14  vanco 02/28/14>>>03/03/14  Cefazolin 03/03/14>>>          Procedures/Studies: Dg Chest 2 View  02/28/2014   CLINICAL DATA:  Fever.  Shortness of breath.  Chest pain.  EXAM: CHEST  2 VIEW  COMPARISON:  01/31/2014  FINDINGS: The patient is rotated to the Right on today's radiograph, reducing diagnostic sensitivity and specificity. Abnormal airspace opacities observed in the right mid lung and right lung base. Indistinct pulmonary vasculature with cardiopericardial silhouette enlarged compared to the prior exam.  Power injectable Port-A-Cath tip:  Lower SVC.  Emphysema noted.  No pleural effusion identified.  IMPRESSION: 1. Newly enlarged cardiopericardial silhouette with indistinct pulmonary vasculature favoring pulmonary venous hypertension/congestive heart failure. However, there is also confluent airspace opacity in the right lower lobe and potentially right middle lobe which could reflect asymmetric edema or pneumonia. 2. Emphysema.   Electronically Signed   By: Sherryl Barters M.D.   On: 02/28/2014 17:44   Ir Removal [employer] Access W/ Port W/o Fl Mod Sed  03/04/2014   CLINICAL DATA:  63 year old with bacteremia and request for Port-A-Cath removal.  EXAM: REMOVAL OF SUBCUTANEOUS PORT-A-CATH  Physician: Stephan Minister. Anselm Pancoast, MD  FLUOROSCOPY TIME:  None  MEDICATIONS: 1 mg Versed, 50 mcg fentanyl.  ANESTHESIA/SEDATION: Patient was monitored by the ICU nurse during the procedure.  PROCEDURE: The procedure was explained  to the patient. The risks and benefits of the procedure were discussed and the patient's questions were addressed. Informed consent was obtained from the  patient. The right anterior chest was prepped and draped in sterile fashion. Maximal barrier sterile technique was utilized including caps, mask, sterile gowns, sterile gloves, sterile drape, hand hygiene and skin antiseptic. The port skin site appeared to be healthy. The old incision area was anesthetized with 1% lidocaine. An incision was made at the old incision site. The port and catheter were easily removed. There was a thin yellow film around the port catheter but no other purulent fluid in the pocket. The pocket was thoroughly irrigated with saline. Pocket was closed using interrupted 2-0 Ethilon sutures. Dressing was placed over the incision site. The port was sent for culture.  FINDINGS: Small amount of yellow material around the catheter. Otherwise, the pocket appeared to be healthy.  COMPLICATIONS: None  IMPRESSION: Successful removal of the right chest Port-A-Cath.  Skin sutures will need to be removed in 10-14 days.   Electronically Signed   By: Markus Daft M.D.   On: 03/04/2014 18:30   Dg Chest Port 1 View  03/02/2014   CLINICAL DATA:  Shortness of breath, infiltrate.  EXAM: PORTABLE CHEST - 1 VIEW  COMPARISON:  03/01/2014  FINDINGS: Degraded by rotation. Prominent cardiomediastinal contours. Aortic tortuosity. Right chest wall Port-A-Cath with tip projecting over the mid SVC. Mild peripheral opacity right lower lung, improving. Mild increased left infrahilar opacity. Background interstitial prominence/ COPD. Small right pleural effusion. No pneumothorax. Osteopenia.  IMPRESSION: Improving aeration along the periphery of the right lung. Mild residual right lung base opacity and mild increased left infrahilar opacity; atelectasis versus infiltrate.  Small right pleural effusion.  Interstitial prominence/COPD.  Mild interstitial edema not excluded.   Electronically Signed   By: Carlos Levering M.D.   On: 03/02/2014 05:57   Dg Chest Port 1 View  03/01/2014   CLINICAL DATA:  Shortness of breath.  EXAM:  PORTABLE CHEST - 1 VIEW  COMPARISON:  March 09, 2014.  FINDINGS: Stable cardiomediastinal silhouette. Right internal jugular Port-A-Cath is unchanged in position. No pneumothorax or significant pleural effusion is noted. Stable airspace opacity is noted laterally in the right midlung concerning for pneumonia or subsegmental atelectasis. Stable central pulmonary vascular congestion is noted as well as probable bilateral pulmonary edema.  IMPRESSION: Stable central pulmonary vascular congestion and pulmonary edema is noted. Stable opacity is noted laterally in right midlung consistent with atelectasis or pneumonia.   Electronically Signed   By: Sabino Dick M.D.   On: 03/01/2014 08:25   Dg Chest Port 1 View  03/01/2014   CLINICAL DATA:  Pneumonia.  EXAM: PORTABLE CHEST - 1 VIEW  COMPARISON:  PA and lateral chest 02/28/2014 and 01/31/2014. PET CT scan 12/21/2013.  FINDINGS: Port-A-Cath remains in place. The lungs are emphysematous. Airspace opacity in the right mid lung zone persists but appears mildly improved. Cardiomegaly and pulmonary vascular congestion are again seen. PEG tube is noted.  IMPRESSION: Airspace opacity right mid lung zone likely due to pneumonia appears mildly improved.  Cardiomegaly and vascular congestion.  Emphysema.   Electronically Signed   By: Inge Rise M.D.   On: 03/01/2014 07:27         Subjective: Patient denies fevers, chills, headache, chest pain, dyspnea, nausea, vomiting, diarrhea, abdominal pain, dysuria, hematuria   Objective: Filed Vitals:   03/06/14 2001 03/07/14 0602 03/07/14 0849 03/07/14 1350  BP: 140/78 108/70  128/60  Pulse:  88 93  97  Temp: 97.7 F (36.5 C) 97.6 F (36.4 C)  98.8 F (37.1 C)  TempSrc: Oral Oral  Oral  Resp: 18 20  18   Height:      Weight: 51.6 kg (113 lb 12.1 oz) 53.6 kg (118 lb 2.7 oz)    SpO2: 100% 99% 100% 99%    Intake/Output Summary (Last 24 hours) at 03/07/14 1820 Last data filed at 03/07/14 1749  Gross per 24  hour  Intake    540 ml  Output   1850 ml  Net  -1310 ml   Weight change:  Exam:   General:  Pt is alert, follows commands appropriately, not in acute distress  HEENT: No icterus, No thrush,  [place]/AT  Cardiovascular: RRR, S1/S2, no rubs, no gallops  Respiratory: CTA bilaterally, no wheezing, no crackles, no rhonchi  Abdomen: Soft/+BS, non tender, non distended, no guarding  Extremities: No edema, No lymphangitis, No petechiae, No rashes, no synovitis  Data Reviewed: Basic Metabolic Panel:  Recent Labs Lab 03/03/14 0555 03/03/14 1750 03/04/14 0418 03/05/14 0335 03/06/14 0519 03/07/14 0555  NA 137  --  135* 136* 145 141  K 3.3*  --  3.6* 3.7 3.4* 3.1*  CL 98  --  97 98 106 102  CO2 21  --  21 23 26 27   GLUCOSE 344* 62* 357* 308* 205* 98  BUN 45*  --  41* 41* 43* 34*  CREATININE 1.50*  --  1.28 1.14 1.08 0.95  CALCIUM 8.9  --  8.9 9.3 9.7 9.1  MG  --   --   --   --  1.9  --    Liver Function Tests:  Recent Labs Lab 03/01/14 0630  AST 65*  ALT 34  ALKPHOS 109  BILITOT 1.0  PROT 7.3  ALBUMIN 2.6*   No results found for this basename: LIPASE, AMYLASE,  in the last 168 hours No results found for this basename: AMMONIA,  in the last 168 hours CBC:  Recent Labs Lab 02/28/14 1918 03/01/14 0630 03/03/14 0555 03/04/14 0418 03/07/14 0555  WBC 6.4 7.7 6.4 5.2 4.4  HGB 6.7* 10.3* 9.6* 9.6* 10.2*  HCT 19.7* 30.8* 27.5* 28.3* 31.1*  MCV 88.7 86.5 84.4 86.5 88.9  PLT 167 159 157 148* 158   Cardiac Enzymes:  Recent Labs Lab 02/28/14 1918 03/01/14 0630 03/01/14 1218 03/02/14 1030 03/02/14 1556  TROPONINI 0.55* 0.38* 0.64* <0.30 <0.30   BNP: No components found with this basename: POCBNP,  CBG:  Recent Labs Lab 03/07/14 0051 03/07/14 0416 03/07/14 0749 03/07/14 1211 03/07/14 1710  GLUCAP 215* 124* 87 140* 133*    Recent Results (from the past 240 hour(s))  CULTURE, BLOOD (ROUTINE X 2)     Status: None   Collection Time    02/28/14  4:36 PM       Result Value Ref Range Status   Specimen Description BLOOD RIGHT HAND   Final   Special Requests BOTTLES DRAWN AEROBIC AND ANAEROBIC 3 ML   Final   Culture  Setup Time     Final   Value: 02/28/2014 18:52     Performed at [facility]   Culture     Final   Value: STAPHYLOCOCCUS AUREUS     Note: SUSCEPTIBILITIES PERFORMED ON PREVIOUS CULTURE WITHIN THE LAST 5 DAYS.     Note: Gram Stain Report Called to,Read Back By and Verified With:  LISA FREY @[id number]  03/01/14 SMIAS     Performed at [facility]   Report Status 03/03/2014 FINAL   Final  CULTURE, BLOOD (ROUTINE X 2)     Status: None   Collection Time    02/28/14  4:36 PM      Result Value Ref Range Status   Specimen Description BLOOD RIGHT PORT   Final   Special Requests BOTTLES DRAWN AEROBIC AND ANAEROBIC 5 ML   Final   Culture  Setup Time     Final   Value: 02/28/2014 18:53     Performed at [facility]   Culture     Final   Value: STAPHYLOCOCCUS AUREUS     Note: RIFAMPIN AND GENTAMICIN SHOULD NOT BE USED AS SINGLE DRUGS FOR TREATMENT OF STAPH INFECTIONS.     Note: Gram Stain Report Called to,Read Back By and Verified With:  Clydene Fake [phone] 03/01/14 SMIAS     Performed at [facility]   Report Status 03/03/2014 FINAL   Final   Organism ID, Bacteria STAPHYLOCOCCUS AUREUS   Final  MRSA PCR SCREENING     Status: None   Collection Time    02/28/14  9:33 PM      Result Value Ref Range Status   MRSA by PCR NEGATIVE  NEGATIVE Final   Comment:            The GeneXpert MRSA Assay (FDA     approved for NASAL specimens     only), is one component of a     comprehensive MRSA colonization     surveillance program. It is not     intended to diagnose MRSA     infection nor to guide or     monitor treatment for     MRSA infections.  CULTURE, BLOOD (ROUTINE X 2)     Status: None   Collection Time    03/02/14 10:30 AM      Result Value Ref Range Status   Specimen Description BLOOD PAC   Final    Special Requests BOTTLES DRAWN AEROBIC AND ANAEROBIC 10CC   Final   Culture  Setup Time     Final   Value: 03/02/2014 13:24     Performed at [facility]   Culture     Final   Value:        BLOOD CULTURE RECEIVED NO GROWTH TO DATE CULTURE WILL BE HELD FOR 5 DAYS BEFORE ISSUING A FINAL NEGATIVE REPORT     Performed at [facility]   Report Status PENDING   Incomplete  CULTURE, BLOOD (ROUTINE X 2)     Status: None   Collection Time    03/02/14 10:40 AM      Result Value Ref Range Status   Specimen Description BLOOD RIGHT HAND   Final   Special Requests BOTTLES DRAWN AEROBIC AND ANAEROBIC 6CC   Final   Culture  Setup Time     Final   Value: 03/02/2014 13:24     Performed at [facility]   Culture     Final   Value:        BLOOD CULTURE RECEIVED NO GROWTH TO DATE CULTURE WILL BE HELD FOR 5 DAYS BEFORE ISSUING A FINAL NEGATIVE REPORT     Performed at [facility]   Report Status PENDING   Incomplete  CATH TIP CULTURE     Status: None   Collection Time    03/04/14  5:52 PM      Result Value Ref Range Status  Specimen Description CATH TIP   Final   Special Requests Normal   Final   Culture     Final   Value: >100 COLONIES STAPHYLOCOCCUS AUREUS     Performed at [facility]   Report Status PENDING   Incomplete  CLOSTRIDIUM DIFFICILE BY PCR     Status: None   Collection Time    03/05/14  3:58 PM      Result Value Ref Range Status   C difficile by pcr NEGATIVE  NEGATIVE Final   Comment: Performed at [facility]     Scheduled Meds: . sodium chloride   Intravenous Once  . aspirin  81 mg Per Tube Daily  . budesonide (PULMICORT) nebulizer solution  0.25 mg Nebulization BID  .  ceFAZolin (ANCEF) IV  2 g Intravenous 3 times per day  . feeding supplement (OSMOLITE 1.5 CAL)  1,000 mL Per Tube Q24H  . folic acid  1 mg Per Tube Daily  . heparin  5,000 Units Subcutaneous 3 times per day  . insulin aspart  0-9 Units Subcutaneous Q4H  .  insulin aspart  4 Units Subcutaneous Q2000  . insulin aspart  4 Units Subcutaneous q12n4p  . ipratropium  0.5 mg Nebulization Q6H WA  . levalbuterol  1.25 mg Nebulization Q6H WA  . multivitamin with minerals  1 tablet Per Tube Daily  . nicotine  21 mg Transdermal Daily  . sodium chloride  3 mL Intravenous Q12H  . sterile water for irrigation  360 mL Irrigation 3 times per day  . sucralfate  1 g Per Tube TID  . terazosin  1 mg Per Tube QHS  . thiamine  100 mg Per Tube Daily   Continuous Infusions:    Marlean Mortell, DO  [facility] Pager [phone]  If 7PM-7AM, please contact night-coverage [email or web address] Password [email or web address] 03/07/2014, 6:20 PM   LOS: 7 days

## 2014-03-07 NOTE — Progress Notes (Signed)
Physical Therapy Treatment Patient Details Name: AMIRR ACHORD MRN: [id number] DOB: Oct 27, 1950 Today's Date: 31-Mar-2014    History of Present Illness  Michael Keith is a 63 y.o. male with history of esophageal cancer presented to his oncologist for a check up noted to have weak pulse and was sent to ED on 02/28/14. Currently   at [facility] for rehab after previous hospitalization. Pt found with  lung infiltrates. Patient  has a PEG.    PT Comments    Pt ambulated in hallway and still presents with some unsteadiness however declines use of RW.  Pt reports likely d/c to SNF tomorrow.  Follow Up Recommendations  SNF     Equipment Recommendations  None recommended by PT    Recommendations for Other Services       Precautions / Restrictions Precautions Precautions: Fall    Mobility  Bed Mobility Overal bed mobility: Modified Independent                Transfers Overall transfer level: Needs assistance Equipment used: None Transfers: Sit to/from Stand Sit to Stand: Min assist         General transfer comment: initial LOB posteriorly upon rise so controlled descent to sitting, pt performed again with better balance  Ambulation/Gait Ambulation/Gait assistance: Min guard Ambulation Distance (Feet): 400 Feet Assistive device:  (held to IV pole due to initial unsteadiness upon standing) Gait Pattern/deviations: Step-through pattern;Decreased stride length Gait velocity: decreased   General Gait Details: mild unsteady gait however pt reports no arches in feet and numbness, no overt LOB observed, pt declines RW again today, pushed IV pole   Stairs            Wheelchair Mobility    Modified Rankin (Stroke Patients Only)       Balance                                    Cognition Arousal/Alertness: Awake/alert Behavior During Therapy: WFL for tasks assessed/performed Overall Cognitive Status: Within Functional Limits for tasks assessed                       Exercises      General Comments        Pertinent Vitals/Pain      Home Living                      Prior Function            PT Goals (current goals can now be found in the care plan section) Progress towards PT goals: Progressing toward goals    Frequency  Min 2X/week    PT Plan Current plan remains appropriate    Co-evaluation             End of Session   Activity Tolerance: Patient tolerated treatment well Patient left: in bed;with call bell/phone within reach     Time: 1456-1510 PT Time Calculation (min): 14 min  Charges:  $Gait Training: 8-22 mins                    G Codes:      Kash Davie,KATHrine E 03/31/14, 3:23 PM Carmelia Bake, PT, DPT 03-31-14 Pager: [phone]

## 2014-03-07 NOTE — Progress Notes (Signed)
Clinical Social Work  CSW spoke with patient and cousin who would prefer placement at [facility] [place]. CSW tried calling SNF but admissions coordinator not in office. CSW will continue to follow and will update [place] on patient's DC plans.  Sindy Messing, LCSW (Coverage for [employer])

## 2014-03-07 NOTE — Care Management Note (Addendum)
    Page 1 of 2   03/09/2014     4:39:54 PM CARE MANAGEMENT NOTE 03/09/2014  Patient:  Michael Keith, Michael Keith   Account Number:  [id number]  Date Initiated:  03/01/2014  Documentation initiated by:  DAVIS,RHONDA  Subjective/Objective Assessment:   pt with anemia and hgb 6.4 on admit, cxr-pna,hx of multiple health problems     Action/Plan:   snf   Anticipated DC Date:  03/09/2014   Anticipated DC Plan:  SKILLED NURSING FACILITY  In-house referral  Clinical Social Worker  Hospice / [place]  CM consult      [facility] Choice  NA   Choice offered to / List presented to:  NA   DME arranged  NA      DME agency  NA     [place] arranged  NA      [place] agency  NA   Status of service:  Completed, signed off Medicare Important Message given?  YES (If response is "NO", the following Medicare IM given date fields will be blank) Date Medicare IM given:  03/07/2014 Medicare IM given by:  [facility] Date Additional Medicare IM given:   Additional Medicare IM given by:    Discharge Disposition:  [place]  Per UR Regulation:  Reviewed for med. necessity/level of care/duration of stay  If discussed at [place] of Stay Meetings, dates discussed:   03/08/2014    Comments:  03/09/14 Nandana Krolikowski RN,BSN NCM [phone] D/C SNF.  03/08/14 Andray Assefa RN,BSN NCM [phone] D/C SNF.  03/07/14 Khamari Yousuf RN,BSN NCM [phone] PICC.IV ABX,TF-GT.D/C PLAN SNF.  Suanne Marker Davis,RN,BSN,CCM [phone]/YTKPT review for updates.

## 2014-03-08 ENCOUNTER — Encounter: Payer: Self-pay | Admitting: Radiation Oncology

## 2014-03-08 ENCOUNTER — Ambulatory Visit
Admission: RE | Admit: 2014-03-08 | Discharge: 2014-03-08 | Disposition: A | Discharge status: Home / Self Care | Payer: Medicare Other | Source: Ambulatory Visit

## 2014-03-08 ENCOUNTER — Ambulatory Visit: Payer: Medicare Other

## 2014-03-08 ENCOUNTER — Telehealth: Payer: Self-pay | Admitting: *Deleted

## 2014-03-08 ENCOUNTER — Encounter: Payer: Self-pay | Admitting: *Deleted

## 2014-03-08 LAB — GLUCOSE, CAPILLARY
GLUCOSE-CAPILLARY: 124 mg/dL — AB (ref 70–99)
GLUCOSE-CAPILLARY: 130 mg/dL — AB (ref 70–99)
GLUCOSE-CAPILLARY: 139 mg/dL — AB (ref 70–99)
Glucose-Capillary: 162 mg/dL — ABNORMAL HIGH (ref 70–99)

## 2014-03-08 LAB — CULTURE, BLOOD (ROUTINE X 2)
CULTURE: NO GROWTH
Culture: NO GROWTH

## 2014-03-08 LAB — BASIC METABOLIC PANEL
Anion gap: 9 (ref 5–15)
BUN: 29 mg/dL — ABNORMAL HIGH (ref 6–23)
CO2: 26 meq/L (ref 19–32)
Calcium: 8.4 mg/dL (ref 8.4–10.5)
Chloride: 100 mEq/L (ref 96–112)
Creatinine, Ser: 0.87 mg/dL (ref 0.50–1.35)
GFR calc non Af Amer: 89 mL/min — ABNORMAL LOW (ref >90)
Glucose, Bld: 143 mg/dL — ABNORMAL HIGH (ref 70–99)
POTASSIUM: 3.5 meq/L — AB (ref 3.7–5.3)
Sodium: 135 mEq/L — ABNORMAL LOW (ref 137–147)

## 2014-03-08 LAB — CATH TIP CULTURE: Special Requests: NORMAL

## 2014-03-08 MED ORDER — FOLIC ACID 1 MG PO TABS: 1.0000 mg | ORAL_TABLET | Freq: Every day | ORAL | Status: AC

## 2014-03-08 MED ORDER — HYDROCODONE-ACETAMINOPHEN 7.5-325 MG/15ML PO SOLN: 15.0000 mL | Freq: Four times a day (QID) | ORAL | Status: DC | PRN

## 2014-03-08 MED ORDER — CEFAZOLIN SODIUM-DEXTROSE 2-3 GM-% IV SOLR: 2.0000 g | Freq: Three times a day (TID) | INTRAVENOUS | Status: AC

## 2014-03-08 MED ORDER — INSULIN NPH (HUMAN) (ISOPHANE) 100 UNIT/ML ~~LOC~~ SUSP: 10.0000 [IU] | Freq: Every day | SUBCUTANEOUS | Status: AC

## 2014-03-08 MED ORDER — HEPARIN SOD (PORK) LOCK FLUSH 100 UNIT/ML IV SOLN
250.0000 [IU] | INTRAVENOUS | Status: AC | PRN
  Administered 2014-03-08: 250 [IU]

## 2014-03-08 NOTE — Progress Notes (Signed)
Met with pt after his final RT to offer support and to celebrate end of radiation treatment.  I explained that my role as navigator will continue for several more months and that I will be calling and/or joining him during follow-up visits.  Pt expressed understanding.  Initiating navigation as L3 patient (treatments completed) with this encounter.  [name], RN, BSN, [place] at [place] [phone]

## 2014-03-08 NOTE — Progress Notes (Signed)
PICC line capped off, flushed with 10cc NS, GBR followed by Heparin 250 unit flush. [employer]

## 2014-03-08 NOTE — Progress Notes (Signed)
Report called to M. Truman Hayward RN,BSN @[facility]  [facility]

## 2014-03-08 NOTE — Progress Notes (Signed)
Weekly Management Note:  Site: Upper esophagus Current Dose:  6000  cGy Projected Dose: 6000  cGy  Narrative: The patient is seen today for routine under treatment assessment. CBCT/MVCT images/port films were reviewed. The chart was reviewed.   He finishes his radiation therapy today. He is without new complaints today. He remains hospitalized. He was admitted for a possible pneumonia/congestive heart failure.  Physical Examination: There were no vitals filed for this visit..  Weight:  . He is not examined today.  Impression: Radiation therapy completed.  Plan: Followup with Dr. Isidore Moos in one month.

## 2014-03-08 NOTE — Progress Notes (Signed)
Patient is set to discharge to [facility] SNF today. Patient & cousin, Shanon Brow (ph#: [phone]) aware. Discharge packet in [place], [name] aware. [facility] called for transport pickup @ 3:30pm.   Clinical Social Work Department CLINICAL SOCIAL WORK PLACEMENT NOTE 03/08/2014  Patient:  Michael Keith, Michael Keith  Account Number:  [id number] Admit date:  02/28/2014  Clinical Social Worker:  Ulyess Blossom  Date/time:  03/04/2014 01:00 PM  Clinical Social Work is seeking post-discharge placement for this patient at the following level of care:   [facility]   (*CSW will update this form in Epic as items are completed)   03/04/2014  Patient/family provided with [place] Department of Clinical Social Work's list of facilities offering this level of care within the geographic area requested by the patient (or if unable, by the patient's family).  03/04/2014  Patient/family informed of their freedom to choose among providers that offer the needed level of care, that participate in Medicare, Medicaid or managed care program needed by the patient, have an available bed and are willing to accept the patient.  03/04/2014  Patient/family informed of [facility]' ownership interest in [facility], as well as of the fact that they are under no obligation to receive care at this facility.  PASARR submitted to EDS on 03/04/2014 PASARR number received on 03/04/2014  FL2 transmitted to all facilities in geographic area requested by pt/family on  03/04/2014 FL2 transmitted to all facilities within larger geographic area on   Patient informed that his/her managed care company has contracts with or will negotiate with  certain facilities, including the following:     Patient/family informed of bed offers received:  03/08/2014 Patient chooses bed at [facility] Physician recommends and patient chooses bed at    Patient to be transferred to [facility] on   03/08/2014 Patient to be transferred to facility by [facility] Patient and family notified of transfer on 03/08/2014 Name of family member notified:  patient's cousin, Shanon Brow via phone (ph#: [phone])  The following physician request were entered in Epic:   Additional Comments:   Raynaldo Opitz, [place] Social Worker cell #: [phone]

## 2014-03-08 NOTE — Discharge Summary (Signed)
Physician Discharge Summary  Michael Keith:[id number] DOB: 06-29-1951 DOA: 02/28/2014  PCP: Philis Fendt, MD  Admit date: 02/28/2014 Discharge date: 03/08/2014  Recommendations for Outpatient Follow-up:  1. Pt will need to follow up with PCP in 2 weeks post discharge 2. Please obtain BMP in one week 3. Please also check CBC in one week 4. Continue cefazolin 2 grams IV every 8 hr through 03/31/14 5. Discontinue PICC line after dose on 03/31/14  Discharge Diagnoses:  Principal Problem:   Staphylococcus aureus bacteremia with sepsis Active Problems:   Nonischemic cardiomyopathy   Mitral regurgitation   COPD (chronic obstructive pulmonary disease)   CAD (coronary artery disease)   Chronic systolic CHF (congestive heart failure)   Squamous cell esophageal cancer   Port-a-cath in place   Severe protein-calorie malnutrition   HCAP (healthcare-associated pneumonia)   Acute-on-chronic respiratory failure   Renal insufficiency   Normocytic anemia   Leukopenia acute on chronic resp failure with hypoxia: multifactorial.  -Secondary to HCAP, COPD exacerbation  -d/c IV lasix  -continue pulmicort and PRN nebulizer  -continue oxygen supplementation-->now weaned to room air  -appreciate PCCM assistance  -wean steroids to off  sepsis  -Resolved  -present at time of admission  - due to HCAP and bacteremia  -continue IV abx  [place] is the patient's Port-A-Cath  -d/c zosyn  -d/c vanco  -03/03/14--started cefazolin  -surveillance blood culture--neg so far  -03/04/14--removal of port-a-cath (placed 01/19/14)  -cath tip culture-->S.aureus  -PICC placement on 03/06/14  -will not be able to perform TEE due to esophageal cancer  -appreciate ID eval  -The patient will continue on cefazolin 2 g IV every 8 hours third of 03/31/2014 which will mark 28 days of therapy from today the Port-A-Cath was removed elevated troponin:  -appears to be demand ischemia in nature.  -check  2-D echo--EF 15%, global HK  -patient no complaining of CP  -will continue monitoring on telemetry  -appreciate cardiology-no further workup at this time Elevated proBNP/chronic systolic CHF  -appears euvolemic today  -in the setting of CKD and sepsis, EF 15%  -strict intake and output  -IV lasix started initially-->stopped and remained clinically stable  -daily BMET  -2-D echo as above  Diabetes mellitus type 2  -02/28/2014 hemoglobin A1c 6.8  -Given the patient's clinical condition--I will allow more liberal glycemic control  -add novolog 4 units @ 2000, 2400, 0400 during TF during the hospitalization -Enteral feeds run from 2000 to 0600 daily  -During the daytime, the patient's glycemic control is very good with CBGs 120 to 130 in the evening when the patient is receiving tube feeds, his CBGs -- increased to the mid 200s.  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 Josepha Pigg chemotherapy 01/25/2014  -continue enteral feeds 8PM-6AM--tolerating well  -Laparoscopic gastrostomy tube placement 02/02/2014--Dr. Johnathan Hausen  -discussed goals of care with patient-->he wants full  curative therapy  -Will allow for ice chips  Code Status: Full  Family Communication: nephew updated at bedside  Disposition Plan: Michael Keith on 03/08/14    Antibiotics:  Zosyn 02/28/14>>>03/03/14  vanco 02/28/14>>>03/03/14  Cefazolin 03/03/14>>>   Discharge Condition: stable  Disposition: [facility]  Diet:Osmolite@115  cc/hr from 8pm to 6am every day Wt Readings from Last 3 Encounters:  03/08/14 51.6 kg (113 lb 12.1 oz)  02/28/14 47.129 kg (103 lb 14.4 oz)  02/21/14 47.219 kg (104 lb 1.6 oz)    History of present illness:  63 y.o. male with history of esophageal cancer presented to his oncologist for a check up noted to have weak pulse and was sent to ED.  Patient states on questioning that he has not been feeling well for some time. He states that he has had chills and has felt shortness of breath. He has had a cough for probably a month now. He brings up yellow sputum. Patient has been a smoker also and has COPD.  He was noted to have on CXR increased infiltrates suggesting a pneumonia. Patient also had an appearance of CHF and could not be entirely ruled out. He states that he had some pain in his chest maybe yesterday currently he states he is pain free. Patient has no swelling has no fevers noted. He has been quite tired however. In the ED he had a POC troponin of 0.4 and his blood troponin was 0.3 which was discussed with the cardiologist on call and felt to be OK to admit to the floor with telemetry.  Admission, the patient was initially started on Solu-Medrol, furosemide, and broad-spectrum antibiotics and. As his clinical situation was clarified, his furosemide was discontinued. Blood cultures were obtained and ultimately grew MSSA. The patient was weaned off of his steroids. The patient's tube feeds were restarted which he tolerated. The patient's antibiotics were narrowed. He was started on cefazolin. Surveillance blood cultures were negative. The patient's Port-A-Cath was removed. Cath tip culture grew MSSA. Patient finished his course of radiation therapy during his hospitalization. Infectious disease, Dr. Megan Salon saw the patient and agreed  with present management. He recommended to continue cefazolin for a total of 4 weeks after removal of the Port-A-Cath. Cardiology saw the patient for elevated troponins and an echocardiogram which showed EF of 15%. The felt that part of his cardiomyopathy was due to sepsis and it could be anticipated that this would improve as his condition was treated. For his elevated troponin, they felt that this was due to demand ischemia. CCM initially saw the patient for his respiratory failure. They agree with present management and signed off. The patient was restarted on his tube feeds which she tolerated well.     Consultants: ID--Dr. Megan Salon CCM--Dr. [facility] cardiology  Discharge Exam: Filed Vitals:   03/08/14 0432  BP: 125/56  Pulse: 100  Temp: 98.7 F (37.1 C)  Resp: 16   Filed Vitals:   03/07/14 1350 03/07/14 2013 03/08/14 0432 03/08/14 0831  BP: 128/60 124/71 125/56   Pulse: 97 86 100   Temp: 98.8 F (37.1 C) 98.9 F (37.2 C) 98.7 F (37.1 C)   TempSrc: Oral Oral Oral   Resp: 18 16 16    Height:      Weight:   51.6 kg (113 lb 12.1 oz)   SpO2: 99% 100% 97% 93%   General: A&O x 3, NAD, pleasant, cooperative Cardiovascular: RRR, no rub, no gallop, no S3 Respiratory: CTAB, no wheeze, no rhonchi Abdomen:soft, nontender,  nondistended, positive bowel sounds Extremities: No edema, No lymphangitis, no petechiae  Discharge Instructions      Discharge Instructions   Diet - low sodium heart healthy    Complete by:  As directed      Increase activity slowly    Complete by:  As directed             Medication List    STOP taking these medications       TUBERCULIN PPD ID      TAKE these medications       aspirin EC 81 MG tablet  81 mg by PEG Tube route every morning.     ceFAZolin 2-3 GM-% Solr  Commonly known as:  ANCEF  Inject 50 mLs (2 g total) into the vein every 8 (eight) hours. With last day on 03/31/14     feeding supplement (OSMOLITE 1.5 CAL) Liqd  1,000  mLs by PEG Tube route continuous. 115 ml/hr  For 10 hrs  (1OX-0RU)     folic acid 1 MG tablet  Commonly known as:  FOLVITE  Place 1 tablet (1 mg total) into feeding tube daily.     HYDROcodone-acetaminophen 7.5-325 mg/15 ml solution  Commonly known as:  HYCET  Place 15 mLs into feeding tube every 6 (six) hours as needed (FOR PAIN RELATED TO MALIGNANT NEOPLASM OF ESOPHAGUS).     insulin NPH Human 100 UNIT/ML injection  Commonly known as:  NOVOLIN N  Inject 0.1 mLs (10 Units total) into the skin daily at 8 pm.     lidocaine-prilocaine cream  Commonly known as:  EMLA  Apply 1 application topically as needed (port access.).     metoCLOPramide 5 MG tablet  Commonly known as:  REGLAN  Take 5 mg by mouth every 8 (eight) hours as needed for nausea.     multivitamin with minerals Tabs tablet  1 tablet by PEG Tube route daily.     nicotine 21 mg/24hr patch  Commonly known as:  NICODERM CQ - dosed in mg/24 hours  Place 21 mg onto the skin daily.     nitroGLYCERIN 0.4 MG SL tablet  Commonly known as:  NITROSTAT  Place 0.4 mg under the tongue every 5 (five) minutes x 3 doses as needed for chest pain.     omeprazole-sodium bicarbonate 40-1100 MG per capsule  Commonly known as:  ZEGERID  Take 1 capsule by mouth See admin instructions. Via Peg-tube once daily     ondansetron 4 MG disintegrating tablet  Commonly known as:  ZOFRAN-ODT  4 mg by PEG Tube route every 8 (eight) hours as needed for nausea or vomiting.     PRESCRIPTION MEDICATION  120 mLs by PEG Tube route 3 (three) times daily. Med plus     sterile water for irrigation  Irrigate with 360 mLs as directed every 8 (eight) hours.     sucralfate 1 GM/10ML suspension  Commonly known as:  CARAFATE  1 g by PEG Tube route 3 (three) times daily. FOR GASTRIC PROTECTION     terazosin 1 MG capsule  Commonly known as:  HYTRIN  1 mg by PEG Tube route at bedtime.     thiamine 100 MG tablet  Commonly known as:  VITAMIN B-1  100 mg  by PEG Tube route daily.         The results of significant diagnostics from this hospitalization (including imaging, microbiology, ancillary and laboratory) are listed below for reference.    Significant Diagnostic Studies: Dg  Chest 2 View  02/28/2014   CLINICAL DATA:  Fever.  Shortness of breath.  Chest pain.  EXAM: CHEST  2 VIEW  COMPARISON:  01/31/2014  FINDINGS: The patient is rotated to the Right on today's radiograph, reducing diagnostic sensitivity and specificity. Abnormal airspace opacities observed in the right mid lung and right lung base. Indistinct pulmonary vasculature with cardiopericardial silhouette enlarged compared to the prior exam.  Power injectable Port-A-Cath tip:  Lower SVC.  Emphysema noted.  No pleural effusion identified.  IMPRESSION: 1. Newly enlarged cardiopericardial silhouette with indistinct pulmonary vasculature favoring pulmonary venous hypertension/congestive heart failure. However, there is also confluent airspace opacity in the right lower lobe and potentially right middle lobe which could reflect asymmetric edema or pneumonia. 2. Emphysema.   Electronically Signed   By: Sherryl Barters M.D.   On: 02/28/2014 17:44   Ir Removal [employer] Access W/ Port W/o Fl Mod Sed  03/04/2014   CLINICAL DATA:  63 year old with bacteremia and request for Port-A-Cath removal.  EXAM: REMOVAL OF SUBCUTANEOUS PORT-A-CATH  Physician: Stephan Minister. Anselm Pancoast, MD  FLUOROSCOPY TIME:  None  MEDICATIONS: 1 mg Versed, 50 mcg fentanyl.  ANESTHESIA/SEDATION: Patient was monitored by the ICU nurse during the procedure.  PROCEDURE: The procedure was explained to the patient. The risks and benefits of the procedure were discussed and the patient's questions were addressed. Informed consent was obtained from the patient. The right anterior chest was prepped and draped in sterile fashion. Maximal barrier sterile technique was utilized including caps, mask, sterile gowns, sterile gloves, sterile drape, hand hygiene  and skin antiseptic. The port skin site appeared to be healthy. The old incision area was anesthetized with 1% lidocaine. An incision was made at the old incision site. The port and catheter were easily removed. There was a thin yellow film around the port catheter but no other purulent fluid in the pocket. The pocket was thoroughly irrigated with saline. Pocket was closed using interrupted 2-0 Ethilon sutures. Dressing was placed over the incision site. The port was sent for culture.  FINDINGS: Small amount of yellow material around the catheter. Otherwise, the pocket appeared to be healthy.  COMPLICATIONS: None  IMPRESSION: Successful removal of the right chest Port-A-Cath.  Skin sutures will need to be removed in 10-14 days.   Electronically Signed   By: Markus Daft M.D.   On: 03/04/2014 18:30   Dg Chest Port 1 View  03/02/2014   CLINICAL DATA:  Shortness of breath, infiltrate.  EXAM: PORTABLE CHEST - 1 VIEW  COMPARISON:  03/01/2014  FINDINGS: Degraded by rotation. Prominent cardiomediastinal contours. Aortic tortuosity. Right chest wall Port-A-Cath with tip projecting over the mid SVC. Mild peripheral opacity right lower lung, improving. Mild increased left infrahilar opacity. Background interstitial prominence/ COPD. Small right pleural effusion. No pneumothorax. Osteopenia.  IMPRESSION: Improving aeration along the periphery of the right lung. Mild residual right lung base opacity and mild increased left infrahilar opacity; atelectasis versus infiltrate.  Small right pleural effusion.  Interstitial prominence/COPD.  Mild interstitial edema not excluded.   Electronically Signed   By: Carlos Levering M.D.   On: 03/02/2014 05:57   Dg Chest Port 1 View  03/01/2014   CLINICAL DATA:  Shortness of breath.  EXAM: PORTABLE CHEST - 1 VIEW  COMPARISON:  March 09, 2014.  FINDINGS: Stable cardiomediastinal silhouette. Right internal jugular Port-A-Cath is unchanged in position. No pneumothorax or significant pleural  effusion is noted. Stable airspace opacity is noted laterally in the right midlung concerning for  pneumonia or subsegmental atelectasis. Stable central pulmonary vascular congestion is noted as well as probable bilateral pulmonary edema.  IMPRESSION: Stable central pulmonary vascular congestion and pulmonary edema is noted. Stable opacity is noted laterally in right midlung consistent with atelectasis or pneumonia.   Electronically Signed   By: Sabino Dick M.D.   On: 03/01/2014 08:25   Dg Chest Port 1 View  03/01/2014   CLINICAL DATA:  Pneumonia.  EXAM: PORTABLE CHEST - 1 VIEW  COMPARISON:  PA and lateral chest 02/28/2014 and 01/31/2014. PET CT scan 12/21/2013.  FINDINGS: Port-A-Cath remains in place. The lungs are emphysematous. Airspace opacity in the right mid lung zone persists but appears mildly improved. Cardiomegaly and pulmonary vascular congestion are again seen. PEG tube is noted.  IMPRESSION: Airspace opacity right mid lung zone likely due to pneumonia appears mildly improved.  Cardiomegaly and vascular congestion.  Emphysema.   Electronically Signed   By: Inge Rise M.D.   On: 03/01/2014 07:27     Microbiology: Recent Results (from the past 240 hour(s))  CULTURE, BLOOD (ROUTINE X 2)     Status: None   Collection Time    02/28/14  4:36 PM      Result Value Ref Range Status   Specimen Description BLOOD RIGHT HAND   Final   Special Requests BOTTLES DRAWN AEROBIC AND ANAEROBIC 3 ML   Final   Culture  Setup Time     Final   Value: 02/28/2014 18:52     Performed at [facility]   Culture     Final   Value: STAPHYLOCOCCUS AUREUS     Note: SUSCEPTIBILITIES PERFORMED ON PREVIOUS CULTURE WITHIN THE LAST 5 DAYS.     Note: Gram Stain Report Called to,Read Back By and Verified With:  LISA FREY @[id number]  03/01/14 SMIAS     Performed at [facility]   Report Status 03/03/2014 FINAL   Final  CULTURE, BLOOD (ROUTINE X 2)     Status: None   Collection Time    02/28/14  4:36 PM       Result Value Ref Range Status   Specimen Description BLOOD RIGHT PORT   Final   Special Requests BOTTLES DRAWN AEROBIC AND ANAEROBIC 5 ML   Final   Culture  Setup Time     Final   Value: 02/28/2014 18:53     Performed at [facility]   Culture     Final   Value: STAPHYLOCOCCUS AUREUS     Note: RIFAMPIN AND GENTAMICIN SHOULD NOT BE USED AS SINGLE DRUGS FOR TREATMENT OF STAPH INFECTIONS.     Note: Gram Stain Report Called to,Read Back By and Verified With:  Clydene Fake [phone] 03/01/14 SMIAS     Performed at [facility]   Report Status 03/03/2014 FINAL   Final   Organism ID, Bacteria STAPHYLOCOCCUS AUREUS   Final  MRSA PCR SCREENING     Status: None   Collection Time    02/28/14  9:33 PM      Result Value Ref Range Status   MRSA by PCR NEGATIVE  NEGATIVE Final   Comment:            The GeneXpert MRSA Assay (FDA     approved for NASAL specimens     only), is one component of a     comprehensive MRSA colonization     surveillance program. It is not     intended to diagnose MRSA     infection  nor to guide or     monitor treatment for     MRSA infections.  CULTURE, BLOOD (ROUTINE X 2)     Status: None   Collection Time    03/02/14 10:30 AM      Result Value Ref Range Status   Specimen Description BLOOD PAC   Final   Special Requests BOTTLES DRAWN AEROBIC AND ANAEROBIC 10CC   Final   Culture  Setup Time     Final   Value: 03/02/2014 13:24     Performed at [facility]   Culture     Final   Value: NO GROWTH 5 DAYS     Performed at [facility]   Report Status 03/08/2014 FINAL   Final  CULTURE, BLOOD (ROUTINE X 2)     Status: None   Collection Time    03/02/14 10:40 AM      Result Value Ref Range Status   Specimen Description BLOOD RIGHT HAND   Final   Special Requests BOTTLES DRAWN AEROBIC AND ANAEROBIC [facility]   Final   Culture  Setup Time     Final   Value: 03/02/2014 13:24     Performed at [facility]   Culture     Final   Value:  NO GROWTH 5 DAYS     Performed at [facility]   Report Status 03/08/2014 FINAL   Final  CATH TIP CULTURE     Status: None   Collection Time    03/04/14  5:52 PM      Result Value Ref Range Status   Specimen Description CATH TIP   Final   Special Requests Normal   Final   Culture     Final   Value: >100 COLONIES STAPHYLOCOCCUS AUREUS     Note: RIFAMPIN AND GENTAMICIN SHOULD NOT BE USED AS SINGLE DRUGS FOR TREATMENT OF STAPH INFECTIONS.     Performed at [facility]   Report Status 03/08/2014 FINAL   Final   Organism ID, Bacteria STAPHYLOCOCCUS AUREUS   Final  CLOSTRIDIUM DIFFICILE BY PCR     Status: None   Collection Time    03/05/14  3:58 PM      Result Value Ref Range Status   C difficile by pcr NEGATIVE  NEGATIVE Final   Comment: Performed at [facility]: Basic Metabolic Panel:  Recent Labs Lab 03/03/14 0555 03/03/14 1750 03/04/14 0418 03/05/14 0335 03/06/14 0519 03/07/14 0555  NA 137  --  135* 136* 145 141  K 3.3*  --  3.6* 3.7 3.4* 3.1*  CL 98  --  97 98 106 102  CO2 21  --  21 23 26 27   GLUCOSE 344* 62* 357* 308* 205* 98  BUN 45*  --  41* 41* 43* 34*  CREATININE 1.50*  --  1.28 1.14 1.08 0.95  CALCIUM 8.9  --  8.9 9.3 9.7 9.1  MG  --   --   --   --  1.9  --    Liver Function Tests: No results found for this basename: AST, ALT, ALKPHOS, BILITOT, PROT, ALBUMIN,  in the last 168 hours No results found for this basename: LIPASE, AMYLASE,  in the last 168 hours No results found for this basename: AMMONIA,  in the last 168 hours CBC:  Recent Labs Lab 03/03/14 0555 03/04/14 0418 03/07/14 0555  WBC 6.4 5.2 4.4  HGB 9.6* 9.6* 10.2*  HCT 27.5* 28.3* 31.1*  MCV 84.4 86.5 88.9  PLT 157 148* 158   Cardiac Enzymes:  Recent Labs Lab 03/02/14 1030 03/02/14 1556  TROPONINI <0.30 <0.30   BNP: No components found with this basename: POCBNP,  CBG:  Recent Labs Lab 03/07/14 2011 03/08/14 0003 03/08/14 0434 03/08/14 0749  03/08/14 1159  GLUCAP 140* 139* 162* 124* 130*    Time coordinating discharge:  Greater than 30 minutes  Signed:  Arend Bahl, DO [facility] Pager: [phone] 03/08/2014, 2:08 PM

## 2014-03-08 NOTE — Telephone Encounter (Signed)
Called patient's cousin, [name], to inform him per my conversation with [name]'s RN, [name], that Michael Keith should call her at about 1:00 to check on the status of his DC.  At that time she can provide clarification if his transfer to [facility] will be by ambulance or whether he should pick him up.  He verbalized understanding.  [name], RN, BSN, [place] at [place] [phone]

## 2014-03-09 ENCOUNTER — Non-Acute Institutional Stay (SKILLED_NURSING_FACILITY): Payer: Medicare Other | Admitting: Internal Medicine

## 2014-03-09 ENCOUNTER — Other Ambulatory Visit: Payer: Self-pay | Admitting: *Deleted

## 2014-03-09 DIAGNOSIS — J439 Emphysema, unspecified: Secondary | ICD-10-CM

## 2014-03-09 DIAGNOSIS — A4101 Sepsis due to Methicillin susceptible Staphylococcus aureus: Secondary | ICD-10-CM

## 2014-03-09 DIAGNOSIS — I428 Other cardiomyopathies: Secondary | ICD-10-CM

## 2014-03-09 DIAGNOSIS — E1129 Type 2 diabetes mellitus with other diabetic kidney complication: Secondary | ICD-10-CM

## 2014-03-09 DIAGNOSIS — J438 Other emphysema: Secondary | ICD-10-CM

## 2014-03-09 DIAGNOSIS — A419 Sepsis, unspecified organism: Secondary | ICD-10-CM

## 2014-03-09 MED ORDER — HYDROCODONE-ACETAMINOPHEN 7.5-325 MG/15ML PO SOLN: 15.0000 mL | Freq: Four times a day (QID) | ORAL | Status: AC | PRN

## 2014-03-09 NOTE — Progress Notes (Signed)
  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  Node negative, distant metastasis negative.  Indication for treatment:  curative       Radiation treatment dates:   01/17/2014-03/08/2014  Site/dose:   Upper esophagus, lower neck, upper mediastinum. 60 Gy in 30 fractions to tumor, 48 Gy in 30 fractions to regional nodes  Beams/energy:   Helical IMRT / 6 MV photons  Narrative: The patient tolerated radiation treatment relatively well.    Due to co-morbidities, he spent many days as an inpatient and only received a fraction of the planned chemotherapy cycles.  Plan: The patient has completed radiation treatment. The patient will return to radiation oncology clinic for routine followup in one month. I advised them to call or return sooner if they have any questions or concerns related to their recovery or treatment.  -----------------------------------  Eppie Gibson, MD

## 2014-03-09 NOTE — Telephone Encounter (Signed)
[facility] 

## 2014-03-10 ENCOUNTER — Encounter: Payer: Self-pay | Admitting: Nutrition

## 2014-03-11 DIAGNOSIS — E1129 Type 2 diabetes mellitus with other diabetic kidney complication: Secondary | ICD-10-CM | POA: Insufficient documentation

## 2014-03-11 NOTE — Progress Notes (Signed)
HISTORY & PHYSICAL  DATE: 03/09/2014   FACILITY: [facility]  LEVEL OF CARE: SNF (31)  ALLERGIES:  No Known Allergies  CHIEF COMPLAINT:  Manage bacteremia, cardiomyopathy and COPD  HISTORY OF PRESENT ILLNESS: 63 year old African American male was hospitalized secondary to staphylococcus aureus bacteremia with sepsis. After hospitalization he is admitted to this facility for short-term rehabilitation and IV antibiotics.  BACTEREMIA: Patient's blood cultures grew MSSA. His port A cath was removed. He is on cefazolin for 4 weeks per ID recommendation. He is tolerating the antibiotics without any problems. He denies fever, chills or night sweats.  CARDIOMYOPATHY: The patient's cardiomyopathy remains stable. Patient denies increasing lower extremity swelling, shortness of breath, chest pain, palpitations, orthopnea or PNDs. No complications reported from the medications currently being used. EF 15%.  COPD: the COPD remains stable.  Pt denies sob, cough, wheezing or declining exercise tolerance.  No complications from the medications presently being used.  PAST MEDICAL HISTORY :  Past Medical History  Diagnosis Date  . Chronic pancreatitis     CT scan shows improving pancreatitis  . Nonischemic cardiomyopathy 09/2009    Catheterization, April, 2011, questionable occlusion of the most apical portion of the LAD, LV dysfunction out of proportion to coronary disease  . Chronic systolic CHF (congestive heart failure)     April, 2011 mild troponin elevation at that time  . Mitral regurgitation     mild, echo April 2011  . Tobacco abuse   . Alcohol use     heavy until Jan 2011, patient was counseled concerning alcohol in April 2011  . COPD (chronic obstructive pulmonary disease)   . Fluid overload 03/2010    October, 2011  . Cough Jan 2012    may be from enalapril, January, 2012  . Ejection fraction < 50%     EF 35-40%, echo, April, 2011  . CAD (coronary  artery disease)     Catheterization, April, 2011, questionable occlusion of the most apical portion of the LAD, LV dysfunction out of proportion to coronary disease  . Squamous cell esophageal cancer   . Severe protein-calorie malnutrition 01/31/2014    PAST SURGICAL HISTORY: Past Surgical History  Procedure Laterality Date  . Cardiac catheterization  April 2011    questionable occlusion of the most apical portion of the LAD / LV dysfunction out of proportion to coronary disease  . Wrist surgey      left wrist  . Laparoscopic gastrostomy N/A 02/02/2014    Procedure: LAPAROSCOPIC GASTROSTOMY TUBE PLACEMENT;  Surgeon: Pedro Earls, MD;  Location: [facility];  Service: General;  Laterality: N/A;    SOCIAL HISTORY:  reports that he quit smoking about 7 weeks ago. His smoking use included Cigarettes. He has a 15 pack-year smoking history. He has never used smokeless tobacco. He reports that he does not drink alcohol or use illicit drugs.  FAMILY HISTORY:  Family History  Problem Relation Age of Onset  . Diabetes Cousin   . Cancer Mother     CURRENT MEDICATIONS: Reviewed per MAR/see medication list  REVIEW OF SYSTEMS:  See HPI otherwise 14 point ROS is negative.  PHYSICAL EXAMINATION  VS:  See VS section  GENERAL: no acute distress, thin body habitus EYES: conjunctivae normal, sclerae normal, normal eye lids MOUTH/THROAT: lips without lesions,no lesions in the mouth,tongue is without lesions,uvula elevates in midline NECK: supple, trachea midline, no neck masses, no thyroid tenderness, no thyromegaly LYMPHATICS: no LAN in  the neck, no supraclavicular LAN RESPIRATORY: breathing is even & unlabored, BS CTAB CARDIAC: RRR, no murmur,no extra heart sounds, no edema GI:  ABDOMEN: abdomen soft, normal BS, no masses, no tenderness, has peg  LIVER/SPLEEN: no hepatomegaly, no splenomegaly MUSCULOSKELETAL: HEAD: normal to inspection  EXTREMITIES: LEFT UPPER EXTREMITY: full range of motion,  normal strength & tone RIGHT UPPER EXTREMITY:  full range of motion, normal strength & tone LEFT LOWER EXTREMITY:  Moderate range of motion, normal strength & tone RIGHT LOWER EXTREMITY: Moderate range of motion, normal strength & tone PSYCHIATRIC: the patient is alert & oriented to person, affect & behavior appropriate  LABS/RADIOLOGY:  Labs reviewed: Basic Metabolic Panel:  Recent Labs  01/31/14 1825  02/07/14 0440  03/06/14 0519 03/07/14 0555 03/08/14 1230  NA 137  < > 137  < > 145 141 135*  K 4.4  < > 3.0*  < > 3.4* 3.1* 3.5*  CL 102  < > 105  < > 106 102 100  CO2 23  < > 18*  < > 26 27 26   GLUCOSE 178*  < > 142*  < > 205* 98 143*  BUN 51*  < > 10  < > 43* 34* 29*  CREATININE 1.65*  < > 1.03  < > 1.08 0.95 0.87  CALCIUM 8.9  < > 8.2*  < > 9.7 9.1 8.4  MG 2.4  --  1.5  --  1.9  --   --   PHOS 3.8  --   --   --   --   --   --   < > = values in this interval not displayed. Liver Function Tests:  Recent Labs  02/27/14 1620 02/28/14 1636 03/01/14 0630  AST 18 22 65*  ALT 10 11 34  ALKPHOS 68 71 109  BILITOT 0.4 0.6 1.0  PROT 7.2 7.7 7.3  ALBUMIN 3.0* 3.1* 2.6*   CBC:  Recent Labs  02/22/14 1016 02/27/14 1620 02/28/14 1636  03/03/14 0555 03/04/14 0418 03/07/14 0555  WBC 3.5* 2.7* 6.4  < > 6.4 5.2 4.4  NEUTROABS 2.1 1.6* 5.5  --   --   --   --   HGB 8.1* 7.5* 7.4*  < > 9.6* 9.6* 10.2*  HCT 24.5* 22.8* 22.2*  < > 27.5* 28.3* 31.1*  MCV 91.0 89.8 89.2  < > 84.4 86.5 88.9  PLT 183 185 185  < > 157 148* 158  < > = values in this interval not displayed.  Cardiac Enzymes:  Recent Labs  03/01/14 1218 03/02/14 1030 03/02/14 1556  TROPONINI 0.64* <0.30 <0.30   CBG:  Recent Labs  03/08/14 0434 03/08/14 0749 03/08/14 1159  GLUCAP 162* 124* 130*   02-28-14  hemoglobin A1c 6.8  Transthoracic Echocardiography  Patient:    Michael Keith, Kinner MR #:       [id number] Study Date: 03/01/2014 Gender:     M Age:        22 Height:     167.6 cm Weight:     46.7  kg BSA:        1.46 m^2 Pt. Status: Room:       [facility] [address], [place]  [place] Al[place] [id number]  [place], Saadat A  SONOGRAPHER  Donata Clay  PERFORMING   [facility], Inpatient  cc:  ------------------------------------------------------------------- LV EF: 15%  ------------------------------------------------------------------- Indications:  Dyspnea 786.09.  ------------------------------------------------------------------- History:   PMH:  Elevated troponin.  ------------------------------------------------------------------- Study Conclusions  - Left ventricle: The cavity size was mildly dilated. Wall   thickness was normal. The estimated ejection fraction was 15%.   Severe global hypokinesis. The study is not technically   sufficient to allow evaluation of LV diastolic function, however,   there is likely significant diastolic dysfunction with high LV   filling pressure. - Mitral valve: Mildly thickened leaflets . There was severe   functional mitral regurgitation. - Left atrium: The atrium was mildly dilated. - Right atrium: Moderately dilated. - Tricuspid valve: There was severe regurgitation. - Pulmonary arteries: PA peak pressure: 75 mm Hg (S). - Systemic veins: The IVC measures <2.1 cm, but does not collapse   more than 50%, suggesting an RA pressure of 8 mmHg.  Impressions:  - Compared to the prior echo in 2013, the EF has now dropped to 15%   from 30%, with signs of significantly elevated left and right   heart filling pressures and severe functional MR and TR.  Transthoracic echocardiography.  M-mode, complete 2D, spectral Doppler, and color Doppler.  Birthdate:  Patient birthdate: 1951-05-27.  Age:  Patient is 63 yr old.  Sex:  Gender: male. BMI: 16.6 kg/m^2.  Blood pressure:     88/58  Patient status: Inpatient.  Study date:  Study date: 03/01/2014. Study time: 02:57 PM.   Location:  ICU/CCU  -------------------------------------------------------------------  ------------------------------------------------------------------- Left ventricle:  The cavity size was mildly dilated. Wall thickness was normal. The estimated ejection fraction was 15%. Severe global hypokinesis. The study is not technically sufficient to allow evaluation of LV diastolic function, however, there is likely significant diastolic dysfunction with high LV filling pressure.   ------------------------------------------------------------------- Aorta:  Aortic root: The aortic root was normal in size. Ascending aorta: The ascending aorta was normal in size.  ------------------------------------------------------------------- Mitral valve:   Mildly thickened leaflets .  Doppler:  There was severe functional mitral regurgitation.  ------------------------------------------------------------------- Left atrium:  The atrium was mildly dilated.  ------------------------------------------------------------------- Atrial septum:  Poorly visualized.  ------------------------------------------------------------------- Right ventricle:  The cavity size was normal. Wall thickness was normal. Systolic function was normal.  ------------------------------------------------------------------- Pulmonic valve:    The valve appears to be grossly normal. Doppler:  There was mild regurgitation.  ------------------------------------------------------------------- Tricuspid valve:   Doppler:  There was severe regurgitation.  ------------------------------------------------------------------- Pulmonary artery:   The main pulmonary artery was normal-sized.  ------------------------------------------------------------------- Right atrium:  Moderately dilated.  ------------------------------------------------------------------- Pericardium:  There was no pericardial  effusion.  ------------------------------------------------------------------- Systemic veins:  The IVC measures <2.1 cm, but does not collapse more than 50%, suggesting an RA pressure of 8 mmHg.  ------------------------------------------------------------------- Measurements   Left ventricle                              Value        Reference  LV ID, ED, PLAX chordal             (H)     52.4  mm     43 - 52  LV ID, ES, PLAX chordal             (H)     48.9  mm     23 - 38  LV fx shortening, PLAX chordal      (L)     7     %      >=29  LV PW  thickness, ED                         9.35  mm     ---------  IVS/LV PW ratio, ED                         0.83         <=7.3  LV end-diastolic volume, 1-p X1G            162   ml     ---------  LV end-systolic volume, 1-p G2I             90    ml     ---------  LV ejection fraction, 1-p A4C               18    %      ---------  LV end-diastolic volume/bsa, 1-p            111   ml/m^2 ---------  R4W  LV end-systolic volume/bsa, 1-p N4O         61    ml/m^2 ---------    Ventricular septum                          Value        Reference  IVS thickness, ED                           7.77  mm     ---------    Aorta                                       Value        Reference  Aortic root ID, ED                          28    mm     ---------    Left atrium                                 Value        Reference  LA ID, A-P, ES                              32    mm     ---------  LA ID/bsa, A-P                              2.19  cm/m^2 <=2.2    Mitral valve                                Value        Reference  Mitral maximal regurg velocity,             496   cm/s   ---------  PISA  Mitral regurg VTI, PISA                     149   cm     ---------  Mitral ERO, PISA                            0.08  cm^2   ---------  Mitral regurg volume, PISA                  12    ml     ---------    Pulmonary arteries                          Value         Reference  PA pressure, S, DP                  (H)     75    mm Hg  <=30    Tricuspid valve                             Value        Reference  Tricuspid regurg peak velocity              357   cm/s   ---------  Tricuspid peak RV-RA gradient               67    mm Hg  ---------  Tricuspid maximal regurg velocity,          409   cm/s   ---------  PISA    Systemic veins                              Value        Reference  Estimated CVP                               8     mm Hg  ---------    Right ventricle                             Value        Reference  RV pressure, S, DP                  (H)     75    mm Hg  <=30  ABDOMEN - 2 VIEW   COMPARISON:  None.   FINDINGS: Gastrostomy tube is in place. No free intraperitoneal gas on the decubitus image. Diffuse colonic distension is noted. Phleboliths project over the pelvis.   IMPRESSION: Diffuse colonic distention.  No free intraperitoneal gas.   PORTABLE CHEST - 1 VIEW   COMPARISON:  03/01/2014   FINDINGS: Degraded by rotation. Prominent cardiomediastinal contours. Aortic tortuosity. Right chest wall Port-A-Cath with tip projecting over the mid SVC. Mild peripheral opacity right lower lung, improving. Mild increased left infrahilar opacity. Background interstitial prominence/ COPD. Small right pleural effusion. No pneumothorax. Osteopenia.   IMPRESSION: Improving aeration along the periphery of the right lung. Mild residual right lung base opacity and mild increased left infrahilar opacity; atelectasis versus infiltrate.   Small right pleural effusion.   Interstitial prominence/COPD.  Mild interstitial edema not excluded.     ASSESSMENT/PLAN:  Staphylococcus aureus bacteremia-continue IV cefazolin for 4 weeks. 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  CPT CODE: 11886  [address], [place] [phone]

## 2014-03-14 ENCOUNTER — Non-Acute Institutional Stay (SKILLED_NURSING_FACILITY): Payer: Medicare Other | Admitting: Internal Medicine

## 2014-03-14 ENCOUNTER — Other Ambulatory Visit: Payer: Self-pay | Admitting: *Deleted

## 2014-03-14 DIAGNOSIS — F411 Generalized anxiety disorder: Secondary | ICD-10-CM

## 2014-03-14 MED ORDER — LORAZEPAM 0.5 MG PO TABS: ORAL_TABLET | ORAL | Status: DC

## 2014-03-14 NOTE — Telephone Encounter (Signed)
[facility] 

## 2014-03-16 ENCOUNTER — Telehealth (HOSPITAL_COMMUNITY): Payer: Self-pay | Admitting: Dental General Practice

## 2014-03-16 ENCOUNTER — Inpatient Hospital Stay (HOSPITAL_COMMUNITY)
Admission: EM | Admit: 2014-03-16 | Discharge: 2014-03-31 | DRG: 871 | Disposition: E | Payer: Medicare Other | Attending: Internal Medicine | Admitting: Internal Medicine

## 2014-03-16 ENCOUNTER — Encounter (HOSPITAL_COMMUNITY): Payer: Self-pay | Admitting: Emergency Medicine

## 2014-03-16 ENCOUNTER — Emergency Department (HOSPITAL_COMMUNITY): Payer: Medicare Other

## 2014-03-16 DIAGNOSIS — Z66 Do not resuscitate: Secondary | ICD-10-CM | POA: Diagnosis not present

## 2014-03-16 DIAGNOSIS — Z9221 Personal history of antineoplastic chemotherapy: Secondary | ICD-10-CM

## 2014-03-16 DIAGNOSIS — Z7982 Long term (current) use of aspirin: Secondary | ICD-10-CM | POA: Diagnosis not present

## 2014-03-16 DIAGNOSIS — K7200 Acute and subacute hepatic failure without coma: Secondary | ICD-10-CM | POA: Diagnosis present

## 2014-03-16 DIAGNOSIS — A4101 Sepsis due to Methicillin susceptible Staphylococcus aureus: Secondary | ICD-10-CM

## 2014-03-16 DIAGNOSIS — R570 Cardiogenic shock: Secondary | ICD-10-CM | POA: Diagnosis not present

## 2014-03-16 DIAGNOSIS — E872 Acidosis, unspecified: Secondary | ICD-10-CM | POA: Diagnosis present

## 2014-03-16 DIAGNOSIS — D696 Thrombocytopenia, unspecified: Secondary | ICD-10-CM | POA: Diagnosis present

## 2014-03-16 DIAGNOSIS — I428 Other cardiomyopathies: Secondary | ICD-10-CM | POA: Diagnosis present

## 2014-03-16 DIAGNOSIS — K861 Other chronic pancreatitis: Secondary | ICD-10-CM | POA: Diagnosis present

## 2014-03-16 DIAGNOSIS — D630 Anemia in neoplastic disease: Secondary | ICD-10-CM | POA: Diagnosis present

## 2014-03-16 DIAGNOSIS — N189 Chronic kidney disease, unspecified: Secondary | ICD-10-CM | POA: Diagnosis present

## 2014-03-16 DIAGNOSIS — Y849 Medical procedure, unspecified as the cause of abnormal reaction of the patient, or of later complication, without mention of misadventure at the time of the procedure: Secondary | ICD-10-CM | POA: Diagnosis present

## 2014-03-16 DIAGNOSIS — R1310 Dysphagia, unspecified: Secondary | ICD-10-CM | POA: Diagnosis present

## 2014-03-16 DIAGNOSIS — Z931 Gastrostomy status: Secondary | ICD-10-CM

## 2014-03-16 DIAGNOSIS — E1169 Type 2 diabetes mellitus with other specified complication: Secondary | ICD-10-CM | POA: Diagnosis present

## 2014-03-16 DIAGNOSIS — D649 Anemia, unspecified: Secondary | ICD-10-CM | POA: Diagnosis present

## 2014-03-16 DIAGNOSIS — A4901 Methicillin susceptible Staphylococcus aureus infection, unspecified site: Secondary | ICD-10-CM | POA: Diagnosis present

## 2014-03-16 DIAGNOSIS — J150 Pneumonia due to Klebsiella pneumoniae: Secondary | ICD-10-CM | POA: Diagnosis present

## 2014-03-16 DIAGNOSIS — R627 Adult failure to thrive: Secondary | ICD-10-CM | POA: Diagnosis present

## 2014-03-16 DIAGNOSIS — T80211A Bloodstream infection due to central venous catheter, initial encounter: Secondary | ICD-10-CM | POA: Diagnosis present

## 2014-03-16 DIAGNOSIS — Z833 Family history of diabetes mellitus: Secondary | ICD-10-CM

## 2014-03-16 DIAGNOSIS — I5043 Acute on chronic combined systolic (congestive) and diastolic (congestive) heart failure: Secondary | ICD-10-CM | POA: Diagnosis present

## 2014-03-16 DIAGNOSIS — C159 Malignant neoplasm of esophagus, unspecified: Secondary | ICD-10-CM | POA: Diagnosis present

## 2014-03-16 DIAGNOSIS — R945 Abnormal results of liver function studies: Secondary | ICD-10-CM

## 2014-03-16 DIAGNOSIS — Z923 Personal history of irradiation: Secondary | ICD-10-CM | POA: Diagnosis not present

## 2014-03-16 DIAGNOSIS — R34 Anuria and oliguria: Secondary | ICD-10-CM | POA: Diagnosis present

## 2014-03-16 DIAGNOSIS — A419 Sepsis, unspecified organism: Secondary | ICD-10-CM | POA: Diagnosis present

## 2014-03-16 DIAGNOSIS — N059 Unspecified nephritic syndrome with unspecified morphologic changes: Secondary | ICD-10-CM | POA: Diagnosis present

## 2014-03-16 DIAGNOSIS — E43 Unspecified severe protein-calorie malnutrition: Secondary | ICD-10-CM | POA: Diagnosis present

## 2014-03-16 DIAGNOSIS — I471 Supraventricular tachycardia, unspecified: Secondary | ICD-10-CM

## 2014-03-16 DIAGNOSIS — C153 Malignant neoplasm of upper third of esophagus: Secondary | ICD-10-CM | POA: Diagnosis present

## 2014-03-16 DIAGNOSIS — I469 Cardiac arrest, cause unspecified: Secondary | ICD-10-CM

## 2014-03-16 DIAGNOSIS — I059 Rheumatic mitral valve disease, unspecified: Secondary | ICD-10-CM | POA: Diagnosis present

## 2014-03-16 DIAGNOSIS — I472 Ventricular tachycardia, unspecified: Secondary | ICD-10-CM

## 2014-03-16 DIAGNOSIS — J9600 Acute respiratory failure, unspecified whether with hypoxia or hypercapnia: Secondary | ICD-10-CM | POA: Diagnosis present

## 2014-03-16 DIAGNOSIS — J441 Chronic obstructive pulmonary disease with (acute) exacerbation: Secondary | ICD-10-CM | POA: Diagnosis present

## 2014-03-16 DIAGNOSIS — Z79899 Other long term (current) drug therapy: Secondary | ICD-10-CM | POA: Diagnosis not present

## 2014-03-16 DIAGNOSIS — I248 Other forms of acute ischemic heart disease: Secondary | ICD-10-CM | POA: Diagnosis present

## 2014-03-16 DIAGNOSIS — N170 Acute kidney failure with tubular necrosis: Secondary | ICD-10-CM | POA: Diagnosis present

## 2014-03-16 DIAGNOSIS — Z794 Long term (current) use of insulin: Secondary | ICD-10-CM

## 2014-03-16 DIAGNOSIS — K8020 Calculus of gallbladder without cholecystitis without obstruction: Secondary | ICD-10-CM | POA: Diagnosis present

## 2014-03-16 DIAGNOSIS — J189 Pneumonia, unspecified organism: Secondary | ICD-10-CM

## 2014-03-16 DIAGNOSIS — T50995A Adverse effect of other drugs, medicaments and biological substances, initial encounter: Secondary | ICD-10-CM | POA: Diagnosis present

## 2014-03-16 DIAGNOSIS — R7989 Other specified abnormal findings of blood chemistry: Secondary | ICD-10-CM

## 2014-03-16 DIAGNOSIS — B179 Acute viral hepatitis, unspecified: Secondary | ICD-10-CM | POA: Diagnosis present

## 2014-03-16 DIAGNOSIS — I2789 Other specified pulmonary heart diseases: Secondary | ICD-10-CM | POA: Diagnosis present

## 2014-03-16 DIAGNOSIS — I5022 Chronic systolic (congestive) heart failure: Secondary | ICD-10-CM | POA: Diagnosis present

## 2014-03-16 DIAGNOSIS — IMO0002 Reserved for concepts with insufficient information to code with codable children: Secondary | ICD-10-CM

## 2014-03-16 DIAGNOSIS — Z87891 Personal history of nicotine dependence: Secondary | ICD-10-CM | POA: Diagnosis not present

## 2014-03-16 DIAGNOSIS — J9621 Acute and chronic respiratory failure with hypoxia: Secondary | ICD-10-CM

## 2014-03-16 DIAGNOSIS — I5023 Acute on chronic systolic (congestive) heart failure: Secondary | ICD-10-CM

## 2014-03-16 DIAGNOSIS — R0602 Shortness of breath: Secondary | ICD-10-CM | POA: Diagnosis present

## 2014-03-16 DIAGNOSIS — J9620 Acute and chronic respiratory failure, unspecified whether with hypoxia or hypercapnia: Secondary | ICD-10-CM

## 2014-03-16 DIAGNOSIS — I4729 Other ventricular tachycardia: Secondary | ICD-10-CM | POA: Diagnosis not present

## 2014-03-16 DIAGNOSIS — R197 Diarrhea, unspecified: Secondary | ICD-10-CM | POA: Diagnosis present

## 2014-03-16 DIAGNOSIS — I509 Heart failure, unspecified: Secondary | ICD-10-CM | POA: Diagnosis present

## 2014-03-16 DIAGNOSIS — E1129 Type 2 diabetes mellitus with other diabetic kidney complication: Secondary | ICD-10-CM | POA: Diagnosis present

## 2014-03-16 DIAGNOSIS — I2510 Atherosclerotic heart disease of native coronary artery without angina pectoris: Secondary | ICD-10-CM | POA: Diagnosis present

## 2014-03-16 DIAGNOSIS — Z809 Family history of malignant neoplasm, unspecified: Secondary | ICD-10-CM | POA: Diagnosis not present

## 2014-03-16 DIAGNOSIS — A4159 Other Gram-negative sepsis: Secondary | ICD-10-CM | POA: Diagnosis present

## 2014-03-16 DIAGNOSIS — R6520 Severe sepsis without septic shock: Secondary | ICD-10-CM

## 2014-03-16 DIAGNOSIS — R0902 Hypoxemia: Secondary | ICD-10-CM

## 2014-03-16 DIAGNOSIS — J449 Chronic obstructive pulmonary disease, unspecified: Secondary | ICD-10-CM

## 2014-03-16 DIAGNOSIS — I2489 Other forms of acute ischemic heart disease: Secondary | ICD-10-CM | POA: Diagnosis present

## 2014-03-16 DIAGNOSIS — N179 Acute kidney failure, unspecified: Secondary | ICD-10-CM | POA: Diagnosis present

## 2014-03-16 LAB — URINALYSIS, ROUTINE W REFLEX MICROSCOPIC
BILIRUBIN URINE: NEGATIVE
Glucose, UA: NEGATIVE mg/dL
HGB URINE DIPSTICK: NEGATIVE
KETONES UR: NEGATIVE mg/dL
Leukocytes, UA: NEGATIVE
Nitrite: NEGATIVE
PROTEIN: 30 mg/dL — AB
SPECIFIC GRAVITY, URINE: 1.035 — AB (ref 1.005–1.030)
UROBILINOGEN UA: 0.2 mg/dL (ref 0.0–1.0)
pH: 5 (ref 5.0–8.0)

## 2014-03-16 LAB — CBC WITH DIFFERENTIAL/PLATELET
Basophils Absolute: 0 10*3/uL (ref 0.0–0.1)
Basophils Absolute: 0 10*3/uL (ref 0.0–0.1)
Basophils Relative: 0 % (ref 0–1)
Basophils Relative: 0 % (ref 0–1)
Eosinophils Absolute: 0 10*3/uL (ref 0.0–0.7)
Eosinophils Absolute: 0 10*3/uL (ref 0.0–0.7)
Eosinophils Relative: 0 % (ref 0–5)
Eosinophils Relative: 0 % (ref 0–5)
HCT: 21.4 % — ABNORMAL LOW (ref 39.0–52.0)
HEMATOCRIT: 22.6 % — AB (ref 39.0–52.0)
HEMOGLOBIN: 7.1 g/dL — AB (ref 13.0–17.0)
Hemoglobin: 7.5 g/dL — ABNORMAL LOW (ref 13.0–17.0)
LYMPHS ABS: 0.8 10*3/uL (ref 0.7–4.0)
Lymphocytes Relative: 10 % — ABNORMAL LOW (ref 12–46)
Lymphocytes Relative: 7 % — ABNORMAL LOW (ref 12–46)
Lymphs Abs: 0.5 10*3/uL — ABNORMAL LOW (ref 0.7–4.0)
MCH: 30.2 pg (ref 26.0–34.0)
MCH: 30.6 pg (ref 26.0–34.0)
MCHC: 33.2 g/dL (ref 30.0–36.0)
MCHC: 33.2 g/dL (ref 30.0–36.0)
MCV: 91.1 fL (ref 78.0–100.0)
MCV: 92.2 fL (ref 78.0–100.0)
MONO ABS: 0.4 10*3/uL (ref 0.1–1.0)
MONO ABS: 0.4 10*3/uL (ref 0.1–1.0)
MONOS PCT: 5 % (ref 3–12)
Monocytes Relative: 6 % (ref 3–12)
NRBC: 1 /100{WBCs} — AB
Neutro Abs: 6.4 10*3/uL (ref 1.7–7.7)
Neutro Abs: 6.2 10*3/uL (ref 1.7–7.7)
Neutrophils Relative %: 85 % — ABNORMAL HIGH (ref 43–77)
Neutrophils Relative %: 87 % — ABNORMAL HIGH (ref 43–77)
Platelets: 20 10*3/uL — CL (ref 150–400)
Platelets: 19 10*3/uL — CL (ref 150–400)
RBC: 2.48 MIL/uL — AB (ref 4.22–5.81)
RBC: 2.32 MIL/uL — ABNORMAL LOW (ref 4.22–5.81)
RDW: 17.2 % — ABNORMAL HIGH (ref 11.5–15.5)
RDW: 17.2 % — ABNORMAL HIGH (ref 11.5–15.5)
WBC: 7.6 10*3/uL (ref 4.0–10.5)
WBC: 7.1 10*3/uL (ref 4.0–10.5)

## 2014-03-16 LAB — BLOOD GAS, ARTERIAL
Acid-base deficit: 8.6 mmol/L — ABNORMAL HIGH (ref 0.0–2.0)
Bicarbonate: 14.9 mEq/L — ABNORMAL LOW (ref 20.0–24.0)
Drawn by: 235321
FIO2: 1 %
O2 Saturation: 99.3 %
PATIENT TEMPERATURE: 97.5
PH ART: 7.411 (ref 7.350–7.450)
TCO2: 14.2 mmol/L (ref 0–100)
pCO2 arterial: 23.7 mmHg — ABNORMAL LOW (ref 35.0–45.0)
pO2, Arterial: 214 mmHg — ABNORMAL HIGH (ref 80.0–100.0)

## 2014-03-16 LAB — COMPREHENSIVE METABOLIC PANEL
ALK PHOS: 194 U/L — AB (ref 39–117)
ALT: 101 U/L — ABNORMAL HIGH (ref 0–53)
ANION GAP: 26 — AB (ref 5–15)
AST: 842 U/L — ABNORMAL HIGH (ref 0–37)
Albumin: 2.7 g/dL — ABNORMAL LOW (ref 3.5–5.2)
BILIRUBIN TOTAL: 1.2 mg/dL (ref 0.3–1.2)
BUN: 45 mg/dL — AB (ref 6–23)
CHLORIDE: 94 meq/L — AB (ref 96–112)
CO2: 15 mEq/L — ABNORMAL LOW (ref 19–32)
Calcium: 8.2 mg/dL — ABNORMAL LOW (ref 8.4–10.5)
Creatinine, Ser: 1.34 mg/dL (ref 0.50–1.35)
GFR, EST AFRICAN AMERICAN: 63 mL/min — AB (ref >90)
GFR, EST NON AFRICAN AMERICAN: 54 mL/min — AB (ref >90)
GLUCOSE: 94 mg/dL (ref 70–99)
Potassium: 4.6 mEq/L (ref 3.7–5.3)
Sodium: 135 mEq/L — ABNORMAL LOW (ref 137–147)
Total Protein: 6.5 g/dL (ref 6.0–8.3)

## 2014-03-16 LAB — PROCALCITONIN: Procalcitonin: 1.82 ng/mL

## 2014-03-16 LAB — PRO B NATRIURETIC PEPTIDE: Pro B Natriuretic peptide (BNP): 29933 pg/mL — ABNORMAL HIGH (ref 0–125)

## 2014-03-16 LAB — POC OCCULT BLOOD, ED: FECAL OCCULT BLD: NEGATIVE

## 2014-03-16 LAB — LACTATE DEHYDROGENASE: LDH: 864 U/L — ABNORMAL HIGH (ref 94–250)

## 2014-03-16 LAB — EXPECTORATED SPUTUM ASSESSMENT W REFEX TO RESP CULTURE

## 2014-03-16 LAB — RAPID URINE DRUG SCREEN, HOSP PERFORMED
AMPHETAMINES: NOT DETECTED
Barbiturates: NOT DETECTED
Benzodiazepines: NOT DETECTED
Cocaine: NOT DETECTED
OPIATES: NOT DETECTED
Tetrahydrocannabinol: NOT DETECTED

## 2014-03-16 LAB — STREP PNEUMONIAE URINARY ANTIGEN: Strep Pneumo Urinary Antigen: NEGATIVE

## 2014-03-16 LAB — HIV ANTIBODY (ROUTINE TESTING W REFLEX): HIV: NONREACTIVE

## 2014-03-16 LAB — I-STAT CG4 LACTIC ACID, ED: Lactic Acid, Venous: 10.42 mmol/L — ABNORMAL HIGH (ref 0.5–2.2)

## 2014-03-16 LAB — HEPATITIS PANEL, ACUTE
HCV Ab: NEGATIVE
Hep A IgM: NONREACTIVE
Hep B C IgM: NONREACTIVE
Hepatitis B Surface Ag: NEGATIVE

## 2014-03-16 LAB — INFLUENZA PANEL BY PCR (TYPE A & B)
H1N1FLUPCR: NOT DETECTED
INFLAPCR: NEGATIVE
INFLBPCR: NEGATIVE

## 2014-03-16 LAB — URINE MICROSCOPIC-ADD ON

## 2014-03-16 LAB — I-STAT TROPONIN, ED: Troponin i, poc: 0.19 ng/mL (ref 0.00–0.08)

## 2014-03-16 LAB — CLOSTRIDIUM DIFFICILE BY PCR: Toxigenic C. Difficile by PCR: NEGATIVE

## 2014-03-16 LAB — EXPECTORATED SPUTUM ASSESSMENT W GRAM STAIN, RFLX TO RESP C

## 2014-03-16 MED ORDER — OSMOLITE 1.5 CAL PO LIQD
1000.0000 mL | ORAL | Status: DC
  Administered 2014-03-16: 1000 mL
  Filled 2014-03-16 (×5): qty 1000

## 2014-03-16 MED ORDER — PIPERACILLIN-TAZOBACTAM 3.375 G IVPB 30 MIN
3.3750 g | Freq: Once | INTRAVENOUS | Status: AC
  Administered 2014-03-16: 3.375 g via INTRAVENOUS
  Filled 2014-03-16: qty 50

## 2014-03-16 MED ORDER — ACETAMINOPHEN 650 MG RE SUPP: 650.0000 mg | Freq: Four times a day (QID) | RECTAL | Status: DC | PRN

## 2014-03-16 MED ORDER — PIPERACILLIN-TAZOBACTAM 3.375 G IVPB
3.3750 g | Freq: Three times a day (TID) | INTRAVENOUS | Status: DC
  Administered 2014-03-16 – 2014-03-21 (×14): 3.375 g via INTRAVENOUS
  Filled 2014-03-16 (×14): qty 50

## 2014-03-16 MED ORDER — SODIUM CHLORIDE 0.9 % IV SOLN: INTRAVENOUS | Status: DC

## 2014-03-16 MED ORDER — NICOTINE 7 MG/24HR TD PT24
7.0000 mg | MEDICATED_PATCH | Freq: Every day | TRANSDERMAL | Status: DC
  Administered 2014-03-16 – 2014-03-20 (×5): 7 mg via TRANSDERMAL
  Filled 2014-03-16 (×6): qty 1

## 2014-03-16 MED ORDER — VITAL HIGH PROTEIN PO LIQD: 1000.0000 mL | ORAL | Status: DC

## 2014-03-16 MED ORDER — OSMOLITE 1.5 CAL PO LIQD
1000.0000 mL | ORAL | Status: DC
  Filled 2014-03-16: qty 1000

## 2014-03-16 MED ORDER — SODIUM CHLORIDE 0.9 % IV SOLN
500.0000 mg | Freq: Two times a day (BID) | INTRAVENOUS | Status: DC
  Administered 2014-03-16 – 2014-03-17 (×2): 500 mg via INTRAVENOUS
  Filled 2014-03-16 (×2): qty 500

## 2014-03-16 MED ORDER — SUCRALFATE 1 GM/10ML PO SUSP
1.0000 g | Freq: Three times a day (TID) | ORAL | Status: DC
  Administered 2014-03-16 – 2014-03-20 (×15): 1 g
  Filled 2014-03-16 (×17): qty 10

## 2014-03-16 MED ORDER — INSULIN NPH (HUMAN) (ISOPHANE) 100 UNIT/ML ~~LOC~~ SUSP
10.0000 [IU] | Freq: Every day | SUBCUTANEOUS | Status: DC
  Administered 2014-03-16 – 2014-03-17 (×2): 10 [IU] via SUBCUTANEOUS
  Filled 2014-03-16: qty 10

## 2014-03-16 MED ORDER — IPRATROPIUM BROMIDE 0.02 % IN SOLN
0.5000 mg | RESPIRATORY_TRACT | Status: DC | PRN
  Administered 2014-03-16 – 2014-03-17 (×2): 0.5 mg via RESPIRATORY_TRACT
  Filled 2014-03-16 (×2): qty 2.5

## 2014-03-16 MED ORDER — IOHEXOL 350 MG/ML SOLN
80.0000 mL | Freq: Once | INTRAVENOUS | Status: AC | PRN
  Administered 2014-03-16: 80 mL via INTRAVENOUS

## 2014-03-16 MED ORDER — LEVALBUTEROL HCL 0.63 MG/3ML IN NEBU
0.6300 mg | INHALATION_SOLUTION | RESPIRATORY_TRACT | Status: DC | PRN
  Administered 2014-03-16 – 2014-03-17 (×2): 0.63 mg via RESPIRATORY_TRACT
  Filled 2014-03-16 (×2): qty 3

## 2014-03-16 MED ORDER — ADULT MULTIVITAMIN W/MINERALS CH
1.0000 | ORAL_TABLET | Freq: Every day | ORAL | Status: DC
Start: 2014-03-16 — End: 2014-03-21
  Administered 2014-03-16 – 2014-03-20 (×5): 1
  Filled 2014-03-16 (×6): qty 1

## 2014-03-16 MED ORDER — IPRATROPIUM-ALBUTEROL 0.5-2.5 (3) MG/3ML IN SOLN
3.0000 mL | Freq: Once | RESPIRATORY_TRACT | Status: AC
  Administered 2014-03-16: 3 mL via RESPIRATORY_TRACT
  Filled 2014-03-16: qty 3

## 2014-03-16 MED ORDER — ACETAMINOPHEN 325 MG PO TABS
650.0000 mg | ORAL_TABLET | Freq: Four times a day (QID) | ORAL | Status: DC | PRN
  Administered 2014-03-16 – 2014-03-18 (×3): 650 mg via ORAL
  Filled 2014-03-16 (×3): qty 2

## 2014-03-16 MED ORDER — NICOTINE 21 MG/24HR TD PT24: 21.0000 mg | MEDICATED_PATCH | Freq: Every day | TRANSDERMAL | Status: DC

## 2014-03-16 MED ORDER — HYDROCODONE-ACETAMINOPHEN 7.5-325 MG/15ML PO SOLN
15.0000 mL | Freq: Four times a day (QID) | ORAL | Status: DC | PRN
  Administered 2014-03-17: 15 mL
  Filled 2014-03-16: qty 15

## 2014-03-16 MED ORDER — VITAMIN B-1 100 MG PO TABS
100.0000 mg | ORAL_TABLET | Freq: Every day | ORAL | Status: DC
  Administered 2014-03-16 – 2014-03-20 (×5): 100 mg
  Filled 2014-03-16 (×6): qty 1

## 2014-03-16 MED ORDER — SODIUM CHLORIDE 0.9 % IV SOLN: INTRAVENOUS | Status: AC

## 2014-03-16 MED ORDER — ONDANSETRON HCL 4 MG/2ML IJ SOLN: 4.0000 mg | Freq: Four times a day (QID) | INTRAMUSCULAR | Status: DC | PRN

## 2014-03-16 MED ORDER — CHLORHEXIDINE GLUCONATE 0.12 % MT SOLN
15.0000 mL | Freq: Two times a day (BID) | OROMUCOSAL | Status: DC
  Administered 2014-03-16 – 2014-03-21 (×9): 15 mL via OROMUCOSAL
  Filled 2014-03-16 (×9): qty 15

## 2014-03-16 MED ORDER — PANTOPRAZOLE SODIUM 40 MG IV SOLR
40.0000 mg | INTRAVENOUS | Status: DC
  Administered 2014-03-16 – 2014-03-20 (×5): 40 mg via INTRAVENOUS
  Filled 2014-03-16 (×6): qty 40

## 2014-03-16 MED ORDER — VANCOMYCIN HCL IN DEXTROSE 1-5 GM/200ML-% IV SOLN
1000.0000 mg | Freq: Once | INTRAVENOUS | Status: AC
  Administered 2014-03-16: 1000 mg via INTRAVENOUS
  Filled 2014-03-16: qty 200

## 2014-03-16 MED ORDER — SODIUM CHLORIDE 0.9 % IJ SOLN
3.0000 mL | Freq: Two times a day (BID) | INTRAMUSCULAR | Status: DC
  Administered 2014-03-16 – 2014-03-17 (×2): 3 mL via INTRAVENOUS
  Administered 2014-03-20: 10 mL via INTRAVENOUS

## 2014-03-16 MED ORDER — FOLIC ACID 1 MG PO TABS
1.0000 mg | ORAL_TABLET | Freq: Every day | ORAL | Status: DC
  Administered 2014-03-16 – 2014-03-20 (×5): 1 mg
  Filled 2014-03-16 (×6): qty 1

## 2014-03-16 MED ORDER — METOCLOPRAMIDE HCL 5 MG PO TABS: 5.0000 mg | ORAL_TABLET | Freq: Three times a day (TID) | ORAL | Status: DC | PRN

## 2014-03-16 MED ORDER — CETYLPYRIDINIUM CHLORIDE 0.05 % MT LIQD
7.0000 mL | Freq: Two times a day (BID) | OROMUCOSAL | Status: DC
  Administered 2014-03-16 – 2014-03-20 (×7): 7 mL via OROMUCOSAL

## 2014-03-16 MED ORDER — STERILE WATER FOR IRRIGATION IR SOLN
360.0000 mL | Freq: Three times a day (TID) | Status: DC
  Administered 2014-03-16 – 2014-03-20 (×11): 360 mL

## 2014-03-16 MED ORDER — ONDANSETRON HCL 4 MG PO TABS: 4.0000 mg | ORAL_TABLET | Freq: Four times a day (QID) | ORAL | Status: DC | PRN

## 2014-03-16 MED ORDER — TERAZOSIN HCL 1 MG PO CAPS
1.0000 mg | ORAL_CAPSULE | Freq: Every day | ORAL | Status: DC
  Administered 2014-03-16 – 2014-03-20 (×5): 1 mg
  Filled 2014-03-16 (×6): qty 1

## 2014-03-16 MED ORDER — SODIUM CHLORIDE 0.9 % IV SOLN
INTRAVENOUS | Status: DC
  Administered 2014-03-16: 09:00:00 via INTRAVENOUS

## 2014-03-16 NOTE — Telephone Encounter (Signed)
Spoke with Michael Keith in reference to Michael Keith. I offered him an appointment for 04/07/14. Mr. Michael Keith stated he had to speak with Mr. Michael Keith and he will be seeing him this afternoon. He also made reference that Mr. Michael Keith was already scheduled for Dr. Isidore Keith on 04/08/14. Mr. Michael Keith will call back and let us know if Mr. Michael Keith can schedule for 04/07/14.    DS

## 2014-03-16 NOTE — ED Notes (Signed)
MD made aware of difficulties obtaining second line and 2nd set of bld cultures. Another RN to attempt when patient returns from CT. MD verifies he does want  2nd set of cultures before administration of antibiotics.

## 2014-03-16 NOTE — ED Notes (Signed)
Pt aware  Of the need for a urine sample. Urinal at bedside

## 2014-03-16 NOTE — Progress Notes (Addendum)
INITIAL NUTRITION ASSESSMENT  Pt meets criteria for severe MALNUTRITION in the context of chronic illness as evidenced by severe wasting in upper arms, hands, and temporal region.  DOCUMENTATION CODES Per approved criteria  -Severe malnutrition in the context of chronic illness -Underweight   INTERVENTION: - Resume home TF regimen via PEG of Osmolite 1.5 at 144m/hr x 10 hours (8pm-6am) - Initiate adult enteral protocol - RD to continue to monitor   NUTRITION DIAGNOSIS: Inadequate oral intake related to inability to eat as evidenced by NPO.   Goal: TF to meet >90% of estimated nutritional needs  Monitor:  Weights, labs, TF tolerance  Reason for Assessment: Malnutrition screening tool, home TF  63y.o. male  Admitting Dx: Severe sepsis  ASSESSMENT: Pt with [facility] of CHF, [facility] for pneumonia and COPD exacerbation, d/c 1 week ago. Admitted for shortness of breath, found to have sepsis.   - Pt's TF regimen was was Osmolite 1.5 at 115/hr x 10 hours via PEG PTA (8pm-6am) - This provided 1725 calories, 72g protein, and 8784mfree water (meets 100% of estimated nutritional needs) - Pt with visible severe wasting in upper arms, hands, and temporal region with moderate wasting in clavicles - Pt asleep on non-rebreather, alone in room  - Weight up 10 pounds in the past month - Obtained verbal order from CCM MD to resume home TF and for RD to manage TF  AST and ALT elevated Alk phos elevated    Height: Ht Readings from Last 1 Encounters:  03/18/2014 5' 6" (1.676 m)    Weight: Wt Readings from Last 1 Encounters:  03/28/2014 114 lb 6.7 oz (51.9 kg)    Ideal Body Weight: 142 lbs   % Ideal Body Weight: 80%  Wt Readings from Last 10 Encounters:  03/20/2014 114 lb 6.7 oz (51.9 kg)  03/14/14 113 lb (51.256 kg)  03/11/14 113 lb (51.256 kg)  03/08/14 113 lb 12.1 oz (51.6 kg)  02/28/14 103 lb 14.4 oz  (47.129 kg)  02/21/14 104 lb 1.6 oz (47.219 kg)  02/15/14 109 lb 1 oz (49.47 kg)  02/07/14 110 lb 14.4 oz (50.304 kg)  02/07/14 110 lb 14.4 oz (50.304 kg)  01/31/14 101 lb 3.2 oz (45.904 kg)    Usual Body Weight: 110 lbs   % Usual Body Weight: 104%  BMI:  Body mass index is 18.48 kg/(m^2). Underweight  Estimated Nutritional Needs: Kcal: 1700-1900 Protein: 70-90g Fluid: 1.7-1.9L/day   Skin: intact   Diet Order: NPO  EDUCATION NEEDS: -No education needs identified at this time  No intake or output data in the 24 hours ending 03/01/2014 1356  Last BM: 9/16  Labs:   Recent Labs Lab 03/15/2014 0324  NA 135*  K 4.6  CL 94*  CO2 15*  BUN 45*  CREATININE 1.34  CALCIUM 8.2*  GLUCOSE 94    CBG (last 3)  No results found for this basename: GLUCAP,  in the last 72 hours  Scheduled Meds: . antiseptic oral rinse  7 mL Mouth Rinse q12n4p  . chlorhexidine  15 mL Mouth Rinse BID  . folic acid  1 mg Per Tube Daily  . insulin NPH Human  10 Units Subcutaneous Q2000  . multivitamin with minerals  1 tablet Per Tube Daily  . nicotine  7 mg Transdermal Daily  . pantoprazole (PROTONIX) IV  40 mg Intravenous Q24H  . piperacillin-tazobactam (ZOSYN)  IV  3.375 g Intravenous 3  times per day  . sodium chloride  3 mL Intravenous Q12H  . sterile water for irrigation  360 mL Irrigation 3 times per day  . sucralfate  1 g Per Tube TID  . terazosin  1 mg Per Tube QHS  . thiamine  100 mg Per Tube Daily  . vancomycin  500 mg Intravenous Q12H    Continuous Infusions: . sodium chloride 40 mL/hr at 03/24/2014 1345  . feeding supplement (OSMOLITE 1.5 CAL)      Past Medical History  Diagnosis Date  . Chronic pancreatitis     CT scan shows improving pancreatitis  . Nonischemic cardiomyopathy 09/2009    Catheterization, April, 2011, questionable occlusion of the most apical portion of the LAD, LV dysfunction out of proportion to coronary disease  . Chronic systolic CHF (congestive heart  failure)     April, 2011 mild troponin elevation at that time  . Mitral regurgitation     mild, echo April 2011  . Tobacco abuse   . Alcohol use     heavy until Jan 2011, patient was counseled concerning alcohol in April 2011  . COPD (chronic obstructive pulmonary disease)   . Fluid overload 03/2010    October, 2011  . Cough Jan 2012    may be from enalapril, January, 2012  . Ejection fraction < 50%     EF 35-40%, echo, April, 2011  . CAD (coronary artery disease)     Catheterization, April, 2011, questionable occlusion of the most apical portion of the LAD, LV dysfunction out of proportion to coronary disease  . Squamous cell esophageal cancer   . Severe protein-calorie malnutrition 01/31/2014    Past Surgical History  Procedure Laterality Date  . Cardiac catheterization  April 2011    questionable occlusion of the most apical portion of the LAD / LV dysfunction out of proportion to coronary disease  . Wrist surgey      left wrist  . Laparoscopic gastrostomy N/A 02/02/2014    Procedure: LAPAROSCOPIC GASTROSTOMY TUBE PLACEMENT;  Surgeon: Pedro Earls, MD;  Location: [facility];  Service: General;  Laterality: N/A;    Carlis Stable MS, [place], LDN [phone] Pager [phone] Weekend/After Hours Pager

## 2014-03-16 NOTE — ED Provider Notes (Addendum)
CSN: [id number]     Arrival date & time 03/09/2014  0246 History   First MD Initiated Contact with Patient 03/13/2014 [phone]     Chief Complaint  Patient presents with  . Shortness of Breath  . Pneumonia     (Consider location/radiation/quality/duration/timing/severity/associated sxs/prior Treatment) The history is provided by the patient.  Michael Keith is a 63 y.o. male hx of CHF, esophageal cancer s/p peg here with shortness of breath. Recently admitted for HCAP and COPD exacerbation. Patient d/c a week ago. Has progressive worsening shortness of breath for last week. Tonight, shortness of breath got worse. Has productive cough.   Level V caveat- condition of patient.    Past Medical History  Diagnosis Date  . Chronic pancreatitis     CT scan shows improving pancreatitis  . Nonischemic cardiomyopathy 09/2009    Catheterization, April, 2011, questionable occlusion of the most apical portion of the LAD, LV dysfunction out of proportion to coronary disease  . Chronic systolic CHF (congestive heart failure)     April, 2011 mild troponin elevation at that time  . Mitral regurgitation     mild, echo April 2011  . Tobacco abuse   . Alcohol use     heavy until Jan 2011, patient was counseled concerning alcohol in April 2011  . COPD (chronic obstructive pulmonary disease)   . Fluid overload 03/2010    October, 2011  . Cough Jan 2012    may be from enalapril, January, 2012  . Ejection fraction < 50%     EF 35-40%, echo, April, 2011  . CAD (coronary artery disease)     Catheterization, April, 2011, questionable occlusion of the most apical portion of the LAD, LV dysfunction out of proportion to coronary disease  . Squamous cell esophageal cancer   . Severe protein-calorie malnutrition 01/31/2014   Past Surgical History  Procedure Laterality Date  . Cardiac catheterization  April 2011    questionable occlusion of the most apical portion of the LAD / LV dysfunction out of proportion to  coronary disease  . Wrist surgey      left wrist  . Laparoscopic gastrostomy N/A 02/02/2014    Procedure: LAPAROSCOPIC GASTROSTOMY TUBE PLACEMENT;  Surgeon: Pedro Earls, MD;  Location: [facility];  Service: General;  Laterality: N/A;   Family History  Problem Relation Age of Onset  . Diabetes Cousin   . Cancer Mother    History  Substance Use Topics  . Smoking status: Former Smoker -- 0.50 packs/day for 30 years    Types: Cigarettes    Quit date: 01/19/2014  . Smokeless tobacco: Never Used     Comment: smoking 3 cigarettes per day  . Alcohol Use: No     Comment: heavy untill Jan 2011    Review of Systems  Respiratory: Positive for shortness of breath.   All other systems reviewed and are negative.     Allergies  Review of patient's allergies indicates no known allergies.  Home Medications   Prior to Admission medications   Medication Sig Start Date End Date Taking? Authorizing Provider  aspirin EC 81 MG tablet 81 mg by PEG Tube route every morning.    Yes Historical Provider, MD  ceFAZolin (ANCEF) 2-3 GM-% SOLR Inject 50 mLs (2 g total) into the vein every 8 (eight) hours. With last day on 03/31/14 03/08/14  Yes Orson Eva, MD  folic acid (FOLVITE) 1 MG tablet Place 1 tablet (1 mg total) into feeding tube daily. 03/08/14  Yes Orson Eva, MD  insulin NPH Human (NOVOLIN N) 100 UNIT/ML injection Inject 0.1 mLs (10 Units total) into the skin daily at 8 pm. 03/08/14  Yes Orson Eva, MD  Multiple Vitamin (MULTIVITAMIN WITH MINERALS) TABS tablet 1 tablet by PEG Tube route daily.    Yes Historical Provider, MD  nicotine (NICODERM CQ - DOSED IN MG/24 HOURS) 21 mg/24hr patch Place 21 mg onto the skin daily.   Yes Historical Provider, MD  Nutritional Supplements (FEEDING SUPPLEMENT, OSMOLITE 1.5 CAL,) LIQD 1,000 mLs by PEG Tube route continuous. 115 ml/hr  For 10 hrs  (8pm-6am)   Yes Historical Provider, MD  omeprazole-sodium bicarbonate (ZEGERID) 40-1100 MG per capsule Take 1 capsule by mouth  See admin instructions. Via Peg-tube once daily   Yes Historical Provider, MD  sucralfate (CARAFATE) 1 GM/10ML suspension 1 g by PEG Tube route 3 (three) times daily. FOR GASTRIC PROTECTION   Yes Historical Provider, MD  terazosin (HYTRIN) 1 MG capsule 1 mg by PEG Tube route at bedtime.    Yes Historical Provider, MD  thiamine (VITAMIN B-1) 100 MG tablet 100 mg by PEG Tube route daily.    Yes Historical Provider, MD  Water For Irrigation, Sterile (STERILE WATER FOR IRRIGATION) Irrigate with 360 mLs as directed every 8 (eight) hours.   Yes Historical Provider, MD  HYDROcodone-acetaminophen (HYCET) 7.5-325 mg/15 ml solution Place 15 mLs into feeding tube every 6 (six) hours as needed (FOR PAIN RELATED TO MALIGNANT NEOPLASM OF ESOPHAGUS). 03/09/14   Blanchie Serve, MD  lidocaine-prilocaine (EMLA) cream Apply 1 application topically as needed (port access.). 01/20/14   Wyatt Portela, MD  metoCLOPramide (REGLAN) 5 MG tablet Take 5 mg by mouth every 8 (eight) hours as needed for nausea.    Historical Provider, MD  nitroGLYCERIN (NITROSTAT) 0.4 MG SL tablet Place 0.4 mg under the tongue every 5 (five) minutes x 3 doses as needed for chest pain.  01/27/14   Carlena Bjornstad, MD  ondansetron (ZOFRAN-ODT) 4 MG disintegrating tablet 4 mg by PEG Tube route every 8 (eight) hours as needed for nausea or vomiting.     Historical Provider, MD  PRESCRIPTION MEDICATION 120 mLs by PEG Tube route 3 (three) times daily. Med plus    Historical Provider, MD   BP 101/53  Pulse 106  Temp(Src) 97.8 F (36.6 C) (Oral)  Resp 24  Ht 5' 6.14" (1.68 m)  Wt 113 lb (51.256 kg)  BMI 18.16 kg/m2  SpO2 97% Physical Exam  Nursing note and vitals reviewed. Constitutional: He is oriented to person, place, and time.  Mod respiratory distress   HENT:  Head: Normocephalic.  Mouth/Throat: Oropharynx is clear and moist.  Eyes: Conjunctivae are normal. Pupils are equal, round, and reactive to light.  Neck: Normal range of motion. Neck  supple.  Cardiovascular: Regular rhythm.   Tachy   Pulmonary/Chest:  Minimal wheezing, mod air movement   Abdominal: Soft. Bowel sounds are normal. He exhibits no distension. There is no tenderness. There is no rebound.  Musculoskeletal: Normal range of motion. He exhibits no edema and no tenderness.  Neurological: He is alert and oriented to person, place, and time.  Skin: Skin is warm and dry.  Psychiatric: He has a normal mood and affect. His behavior is normal. Judgment and thought content normal.    ED Course  Procedures (including critical care time)  CRITICAL CARE Performed by: Darl Householder, Guy Toney   Total critical care time: 30 min   Critical care time was exclusive  of separately billable procedures and treating other patients.  Critical care was necessary to treat or prevent imminent or life-threatening deterioration.  Critical care was time spent personally by me on the following activities: development of treatment plan with patient and/or surrogate as well as nursing, discussions with consultants, evaluation of patient's response to treatment, examination of patient, obtaining history from patient or surrogate, ordering and performing treatments and interventions, ordering and review of laboratory studies, ordering and review of radiographic studies, pulse oximetry and re-evaluation of patient's condition.   Labs Review Labs Reviewed  CBC WITH DIFFERENTIAL - Abnormal; Notable for the following:    RBC 2.48 (*)    Hemoglobin 7.5 (*)    HCT 22.6 (*)    RDW 17.2 (*)    Platelets 20 (*)    Neutrophils Relative % 85 (*)    Lymphocytes Relative 10 (*)    All other components within normal limits  COMPREHENSIVE METABOLIC PANEL - Abnormal; Notable for the following:    Sodium 135 (*)    Chloride 94 (*)    CO2 15 (*)    BUN 45 (*)    Calcium 8.2 (*)    Albumin 2.7 (*)    AST 842 (*)    ALT 101 (*)    Alkaline Phosphatase 194 (*)    GFR calc non Af Amer 54 (*)    GFR calc Af  Amer 63 (*)    Anion gap 26 (*)    All other components within normal limits  BLOOD GAS, ARTERIAL - Abnormal; Notable for the following:    pCO2 arterial 23.7 (*)    pO2, Arterial 214.0 (*)    Bicarbonate 14.9 (*)    Acid-base deficit 8.6 (*)    All other components within normal limits  PRO B NATRIURETIC PEPTIDE - Abnormal; Notable for the following:    Pro B Natriuretic peptide (BNP) 02409.7 (*)    All other components within normal limits  I-STAT CG4 LACTIC ACID, ED - Abnormal; Notable for the following:    Lactic Acid, Venous 10.42 (*)    All other components within normal limits  I-STAT TROPOININ, ED - Abnormal; Notable for the following:    Troponin i, poc 0.19 (*)    All other components within normal limits  CULTURE, BLOOD (ROUTINE X 2)  CULTURE, BLOOD (ROUTINE X 2)  URINE CULTURE  URINALYSIS, ROUTINE W REFLEX MICROSCOPIC  HEPATITIS PANEL, ACUTE  CBC WITH DIFFERENTIAL  LACTATE DEHYDROGENASE  POC OCCULT BLOOD, ED  TYPE AND SCREEN    Imaging Review Ct Angio Chest Pe W/cm &/or Wo Cm  03/02/2014   CLINICAL DATA:  Worsening shortness of breath. Recently diagnosed esophageal cancer, status post chemotherapy and radiation therapy.  EXAM: CT ANGIOGRAPHY CHEST WITH CONTRAST  TECHNIQUE: Multidetector CT imaging of the chest was performed using the standard protocol during bolus administration of intravenous contrast. Multiplanar CT image reconstructions and MIPs were obtained to evaluate the vascular anatomy.  CONTRAST:  23mL OMNIPAQUE IOHEXOL 350 MG/ML SOLN  COMPARISON:  PET/CT performed 12/21/2013  FINDINGS: There is no evidence of pulmonary embolus.  There appears to be a spiculated 2.8 cm pleural-based mass within the right middle lobe, new from the prior study. There is mild associated architectural distortion. This may simply reflect scarring, but malignancy cannot be excluded. Small bilateral pleural effusions are seen, with associated atelectasis.  Emphysematous change is noted  bilaterally, most prominent at the upper lung lobes. No pneumothorax is seen.  Biatrial enlargement is noted.  There is suggestion of an enlarged subcarinal node, measuring 1.6 cm in short axis. No additional mediastinal lymphadenopathy is seen. No pericardial effusion is identified. The great vessels are grossly unremarkable in appearance. A right PICC is noted ending about the mid to distal SVC.  The previously noted proximal esophageal mass is no longer well seen, though this is nonspecific as the esophagus is decompressed at this level. The thyroid gland is grossly unremarkable in appearance. No axillary lymphadenopathy is seen.  There is a new nonspecific 1.9 cm subcutaneous soft tissue nodule at the upper inner quadrant of the right breast. Would correlate for associated skin findings.  There is mild reflux of contrast into the hepatic veins and IVC. The visualized portions of the liver and spleen are grossly unremarkable. Trace ascites is noted within the upper abdomen.  No acute osseous abnormalities are seen.  Review of the MIP images confirms the above findings.  IMPRESSION: 1. No evidence of pulmonary embolus. 2. Apparent spiculated 2.8 cm pleural-based mass within the right middle lung lobe, new from the prior study. Mild associated architectural distortion noted. This may simply reflect scarring, though malignancy cannot be excluded. Follow-up PET/CT would be helpful for further evaluation. 3. Small bilateral pleural effusions with associated atelectasis. 4. Emphysematous change bilaterally, most prominent at the upper lung lobes. 5. Biatrial enlargement noted. 6. Suggestion of enlarged subcarinal node measuring 1.6 cm in short axis, of uncertain significance. 7. Previously noted proximal esophageal mass is no longer well seen, though this is nonspecific as the esophagus is decompressed at this level. This could also be further assessed on PET/CT. 8. New nonspecific 1.9 cm subcutaneous soft tissue nodule  at the upper inner quadrant of the right breast. Would correlate for associated skin findings. 9. Trace ascites within the upper abdomen.   Electronically Signed   By: Garald Balding M.D.   On: 03/04/2014 06:06   Dg Chest Port 1 View  03/08/2014   CLINICAL DATA:  Shortness of breath.  EXAM: PORTABLE CHEST - 1 VIEW  COMPARISON:  Chest radiograph performed 02/28/2014  FINDINGS: The lungs are hyperexpanded, with flattening of the hemidiaphragms, compatible with COPD. A small right pleural effusion is noted. Right-sided airspace opacification has largely resolved, with minimal residual right basilar opacity seen. No pneumothorax is seen. The left costophrenic angle is incompletely imaged on this study.  The cardiomediastinal silhouette is mildly enlarged. No acute osseous abnormalities are seen. A right PICC is noted ending about the mid to distal SVC.  IMPRESSION: 1. Right-sided airspace opacification has largely resolved, with minimal residual right basilar airspace opacity seen. Small right pleural effusion noted. 2. Findings of COPD. 3. Mild cardiomegaly.   Electronically Signed   By: Garald Balding M.D.   On: 03/04/2014 03:51     EKG Interpretation None      MDM   Final diagnoses:  None    Michael Keith is a 63 y.o. male here with shortness of breath. Pulse Ox was 78% but was unable to get good waveform. ABG reassuring. Will do sepsis workup. Will likely need ct angio if CXR doesn't show obvious pneumonia.   4 AM Lactate 10. ABx ordered. Activated code sepsis. CXR clear, ct angio ordered.   6:38 AM Labs showed elevated LFTs, anemic to 7.5 with Platelet 20. Patient also in CHF and has demand ischemia. Has multi organ involvement and I am concerned for possible hemolysis. I called hospitalist, Dr. Hal Hope, who recommend ICU consult. Discussed with ICU, who recommend hospitalist  admission and they will consult. Hospitalist want repeat CBC, [facility].      Wandra Arthurs, MD 03/04/2014 0601  Wandra Arthurs, MD 03/10/2014 [phone]

## 2014-03-16 NOTE — ED Notes (Signed)
Pt has had pneumonia for 2 weeks,  He has increased sob,  Pt is from [facility] nursing  home

## 2014-03-16 NOTE — H&P (Signed)
[facility] History and Physical  Michael Keith QZE:[id number] DOB: [birth year] DOA: 03/19/2014  Referring physician: ER physician PCP: Philis Fendt, MD   Chief Complaint: Shortness of breath  HPI:  63 -year-old male with past medical history of esophageal cancer, status post PEG placement, history of COPD, chronic systolic CHF last 2D echo showing ejection fraction of 15%, recent admission for HCAP, MSSA bacteremia was supposed to finish ancef through 03/31/2014 who presented to [facility] ED 03/13/2014 with worsening shortness of breath over past 24 hours prior to this admission associated with productive cough. Patient is not a good historian at this time, seems to be low lethargic.  In ED, blood pressure was 101/53, HR 49 - 112, RR 22 - 44, T max 97.8 F, oxygen saturation 89% on 2 L nasal cannula oxygen support. Blood work revealed hemoglobin of 7.5, platelets of 20, sodium 135, CO2 15. Chest x-ray showed right-sided airspace opacification which has largely resolved but there is minimal residual right basilar airspace opacity and small pleural effusion. CT angio chest ruled out pulmonary embolism. There was however a spiculated 2.8 cm pleural-based mass within the right middle lung lobe new from prior study which can reflect scarring or malignancy. Also seen were emphysematous changes bilaterally and new nonspecific 1.9 cm subcutaneous soft tissue nodule at the upper inner quadrant of the right breast.   Assessment & Plan    Principal problem: Acute respiratory failure with hypoxia / healthcare acquired pneumonia / new right side focal pneumonia versus mass, possible embolic process / COPD exacerbation   Patient has had ecent hospitalization and treatment for healthcare acquired pneumonia. Patient was on cefazolin on discharge. We will switch antibiotics to broad spectrum, vancomycin and Zosyn to treat for possible new right side focal pneumonia.  Pulmonary embolism ruled out with CT angio  chest.  Pneumonia order set placed. Followup blood culture results, Legionella and strep pneumonia, influenza.  Appreciate PCCM consult and recommendations.   Oxygen support via nasal cannula to keep oxygen saturation above 90%.  Started bronchodilator treatments every 4 hours as needed for shortness of breath.  Active problems: Severe sepsis secondary to probable new right side focal pneumonia  Severe sepsis criteria met with vital signs on admission which included hypotension, tachycardia, tachypnea, oxygen saturation of 89% with nasal cannula oxygen support, lactic acid of 10.42. Evidence of probable pneumonia based on chest x-ray and CT chest.   Treatment with vancomycin and Zosyn. Followup blood cultures, respiratory culture results. Follow up stool for C.diff. Acute on chronic systolic and diastolic CHF  Patient had 2-D echo on recent admission and it showed ejection fraction of 15%. Blood pressure on soft side so will use IV fluids at low rate for 12 hours and reassess if patient still needs IV fluids.  BNP on this admission is 29,000. If blood pressure improves he may receive Lasix bolus. Blood pressure this time is 99/75. Anemia of chronic disease  Secondary to history of malignancy. Hemoglobin is 7.1.   We'll continue to monitor CBC and transfuse if hemoglobin less than 7. Thrombocytopenia  Secondary to history of malignancy.  Platelet count 20. Continue to monitor CBC. Use SCDs for DVT prophylaxis. Metabolic acidosis / lactic acidosis with anion gap  Resp compensation. Etiology unclear but worrisome for infection and sepsis. Esophageal cancer  Status post PEG placement. Will modify oncology of patient's admission. Severe protein calorie malnutrition:   Due to history of esophageal ca  Has PEG in place.   DVT prophylaxis:   SCDs  bilaterally due to to thrombocytopenia  Radiological Exams on Admission:  Ct Angio Chest Pe W/cm &/or Wo Cm 03/10/2014  1. No  evidence of pulmonary embolus. 2. Apparent spiculated 2.8 cm pleural-based mass within the right middle lung lobe, new from the prior study. Mild associated architectural distortion noted. This may simply reflect scarring, though malignancy cannot be excluded. Follow-up PET/CT would be helpful for further evaluation. 3. Small bilateral pleural effusions with associated atelectasis. 4. Emphysematous change bilaterally, most prominent at the upper lung lobes. 5. Biatrial enlargement noted. 6. Suggestion of enlarged subcarinal node measuring 1.6 cm in short axis, of uncertain significance. 7. Previously noted proximal esophageal mass is no longer well seen, though this is nonspecific as the esophagus is decompressed at this level. This could also be further assessed on PET/CT. 8. New nonspecific 1.9 cm subcutaneous soft tissue nodule at the upper inner quadrant of the right breast. Would correlate for associated skin findings. 9. Trace ascites within the upper abdomen.     Dg Chest Port 1 View 03/15/2014   1. Right-sided airspace opacification has largely resolved, with minimal residual right basilar airspace opacity seen. Small right pleural effusion noted. 2. Findings of COPD. 3. Mild cardiomegaly.      Code Status: Full Family Communication: Family not at the bedside this morning. Disposition Plan: Admit for further evaluation to ICU.  Leisa Lenz, MD  [facility] Pager [phone]  Review of Systems:  Patient lethargic, unable to give a history.  Past Medical History  Diagnosis Date  . Chronic pancreatitis     CT scan shows improving pancreatitis  . Nonischemic cardiomyopathy 09/2009    Catheterization, April, 2011, questionable occlusion of the most apical portion of the LAD, LV dysfunction out of proportion to coronary disease  . Chronic systolic CHF (congestive heart failure)     April, 2011 mild troponin elevation at that time  . Mitral regurgitation     mild, echo April 2011  .  Tobacco abuse   . Alcohol use     heavy until Jan 2011, patient was counseled concerning alcohol in April 2011  . COPD (chronic obstructive pulmonary disease)   . Fluid overload 03/2010    October, 2011  . Cough Jan 2012    may be from enalapril, January, 2012  . Ejection fraction < 50%     EF 35-40%, echo, April, 2011  . CAD (coronary artery disease)     Catheterization, April, 2011, questionable occlusion of the most apical portion of the LAD, LV dysfunction out of proportion to coronary disease  . Squamous cell esophageal cancer   . Severe protein-calorie malnutrition 01/31/2014   Past Surgical History  Procedure Laterality Date  . Cardiac catheterization  April 2011    questionable occlusion of the most apical portion of the LAD / LV dysfunction out of proportion to coronary disease  . Wrist surgey      left wrist  . Laparoscopic gastrostomy N/A 02/02/2014    Procedure: LAPAROSCOPIC GASTROSTOMY TUBE PLACEMENT;  Surgeon: Pedro Earls, MD;  Location: [facility];  Service: General;  Laterality: N/A;   Social History:  reports that he quit smoking about 8 weeks ago. His smoking use included Cigarettes. He has a 15 pack-year smoking history. He has never used smokeless tobacco. He reports that he does not drink alcohol or use illicit drugs.  No Known Allergies  Family History:  Family History  Problem Relation Age of Onset  . Diabetes Cousin   . Cancer Mother  Prior to Admission medications   Medication Sig Start Date End Date Taking? Authorizing Provider  aspirin EC 81 MG tablet 81 mg by PEG Tube route every morning.    Yes Historical Provider, MD  ceFAZolin (ANCEF) 2-3 GM-% SOLR Inject 50 mLs (2 g total) into the vein every 8 (eight) hours. With last day on 03/31/14 03/08/14  Yes Orson Eva, MD  folic acid (FOLVITE) 1 MG tablet Place 1 tablet (1 mg total) into feeding tube daily. 03/08/14  Yes Orson Eva, MD  insulin NPH Human (NOVOLIN N) 100 UNIT/ML injection Inject 0.1 mLs (10  Units total) into the skin daily at 8 pm. 03/08/14  Yes Orson Eva, MD  Multiple Vitamin (MULTIVITAMIN WITH MINERALS) TABS tablet 1 tablet by PEG Tube route daily.    Yes Historical Provider, MD  nicotine (NICODERM CQ - DOSED IN MG/24 HOURS) 21 mg/24hr patch Place 21 mg onto the skin daily.   Yes Historical Provider, MD  Nutritional Supplements (FEEDING SUPPLEMENT, OSMOLITE 1.5 CAL,) LIQD 1,000 mLs by PEG Tube route continuous. 115 ml/hr  For 10 hrs  (8pm-6am)   Yes Historical Provider, MD  omeprazole-sodium bicarbonate (ZEGERID) 40-1100 MG per capsule Take 1 capsule by mouth See admin instructions. Via Peg-tube once daily   Yes Historical Provider, MD  sucralfate (CARAFATE) 1 GM/10ML suspension 1 g by PEG Tube route 3 (three) times daily. FOR GASTRIC PROTECTION   Yes Historical Provider, MD  terazosin (HYTRIN) 1 MG capsule 1 mg by PEG Tube route at bedtime.    Yes Historical Provider, MD  thiamine (VITAMIN B-1) 100 MG tablet 100 mg by PEG Tube route daily.    Yes Historical Provider, MD  Water For Irrigation, Sterile (STERILE WATER FOR IRRIGATION) Irrigate with 360 mLs as directed every 8 (eight) hours.   Yes Historical Provider, MD  HYDROcodone-acetaminophen (HYCET) 7.5-325 mg/15 ml solution Place 15 mLs into feeding tube every 6 (six) hours as needed (FOR PAIN RELATED TO MALIGNANT NEOPLASM OF ESOPHAGUS). 03/09/14   Blanchie Serve, MD  lidocaine-prilocaine (EMLA) cream Apply 1 application topically as needed (port access.). 01/20/14   Wyatt Portela, MD  metoCLOPramide (REGLAN) 5 MG tablet Take 5 mg by mouth every 8 (eight) hours as needed for nausea.    Historical Provider, MD  nitroGLYCERIN (NITROSTAT) 0.4 MG SL tablet Place 0.4 mg under the tongue every 5 (five) minutes x 3 doses as needed for chest pain.  01/27/14   Carlena Bjornstad, MD  ondansetron (ZOFRAN-ODT) 4 MG disintegrating tablet 4 mg by PEG Tube route every 8 (eight) hours as needed for nausea or vomiting.     Historical Provider, MD   PRESCRIPTION MEDICATION 120 mLs by PEG Tube route 3 (three) times daily. Med plus    Historical Provider, MD   Physical Exam: Filed Vitals:   03/05/2014 0638 03/22/2014 0700 03/03/2014 0800 03/19/2014 1020  BP: 112/63 105/70 106/73 126/99  Pulse: 96 44 44   Temp:      TempSrc:      Resp:  41 40 29  Height:      Weight:      SpO2: 100% 100% 89% 95%    Physical Exam  Constitutional: pt seems lethargic, did not respond to verbal stimuli but briefly woke up when called his name; frail and cachectic HENT: Normocephalic. No tonsillar erythema or exudates Eyes: Conjunctivae are normal. PERRLA, no scleral icterus.  Neck: Neck supple. No JVD. No tracheal deviation. No thyromegaly.  CVS: Tachycardic, S1/S2 +, no murmurs, no  gallops, no carotid bruit.  Pulmonary: Diminished breath sounds, poor respiratory effort by patient. No wheezing Abdominal: Soft. BS +,  no distension, tenderness, rebound or guarding.  take in place  Musculoskeletal: Normal range of motion. No edema and no tenderness.  Lymphadenopathy: No lymphadenopathy noted, cervical, inguinal. Neuro: No focal neurologic deficits. Skin: Skin is warm and dry. No rash noted.   Labs on Admission:  Basic Metabolic Panel:  Recent Labs Lab 03/02/2014 0324  NA 135*  K 4.6  CL 94*  CO2 15*  GLUCOSE 94  BUN 45*  CREATININE 1.34  CALCIUM 8.2*   Liver Function Tests:  Recent Labs Lab 03/03/2014 0324  AST 842*  ALT 101*  ALKPHOS 194*  BILITOT 1.2  PROT 6.5  ALBUMIN 2.7*   No results found for this basename: LIPASE, AMYLASE,  in the last 168 hours No results found for this basename: AMMONIA,  in the last 168 hours CBC:  Recent Labs Lab 03/26/2014 0324 03/30/2014 0712  WBC 7.6 7.1  NEUTROABS 6.4 6.2  HGB 7.5* 7.1*  HCT 22.6* 21.4*  MCV 91.1 92.2  PLT 20* 19*   Cardiac Enzymes: No results found for this basename: CKTOTAL, CKMB, CKMBINDEX, TROPONINI,  in the last 168 hours BNP: No components found with this basename: POCBNP,   CBG: No results found for this basename: GLUCAP,  in the last 168 hours  If 7PM-7AM, please contact night-coverage [email or web address] Password [facility] 03/19/2014, 10:33 AM

## 2014-03-16 NOTE — Consult Note (Signed)
PULMONARY / CRITICAL CARE MEDICINE   Name: Michael Keith MRN: [id number] DOB: 10/22/1950    ADMISSION DATE:  03/10/2014 CONSULTATION DATE: 9/16  REFERRING MD :  [facility]  CHIEF COMPLAINT: SOB  INITIAL PRESENTATION:Sob acute, lactic acid 10.  STUDIES:  9/16 2 d >>  SIGNIFICANT EVENTS: 9/16 admit to ICU with metabolic acidosis.    HISTORY OF PRESENT ILLNESS:   63 yo AAM with known squamous cell esophogeal cancer, last chemo July 2015 and Rtx June 2015, just discharged from hospital 03/08/14. Noted to have MSSA bacteriemia with removal of port and placement of PICC. Discharged on cefazolin IV with planned stop date 03/31/14.  He presents to [facility] ED  9/16 with acute SOB, negative PE protocol CT of chest but with new spiculaed mass vs nodular PNA. He has a metabolic acidosis with respiratory compensation (lactic acid 10.41). He has had antimicrobials changed to broad spectrum. He is current hemodynamically stable and PCCM asked to consult.  PAST MEDICAL HISTORY :  Past Medical History  Diagnosis Date  . Chronic pancreatitis     CT scan shows improving pancreatitis  . Nonischemic cardiomyopathy 09/2009    Catheterization, April, 2011, questionable occlusion of the most apical portion of the LAD, LV dysfunction out of proportion to coronary disease  . Chronic systolic CHF (congestive heart failure)     April, 2011 mild troponin elevation at that time  . Mitral regurgitation     mild, echo April 2011  . Tobacco abuse   . Alcohol use     heavy until Jan 2011, patient was counseled concerning alcohol in April 2011  . COPD (chronic obstructive pulmonary disease)   . Fluid overload 03/2010    October, 2011  . Cough Jan 2012    may be from enalapril, January, 2012  . Ejection fraction < 50%     EF 35-40%, echo, April, 2011  . CAD (coronary artery disease)     Catheterization, April, 2011, questionable occlusion of the most apical portion of the LAD, LV dysfunction out of proportion to  coronary disease  . Squamous cell esophageal cancer   . Severe protein-calorie malnutrition 01/31/2014   Past Surgical History  Procedure Laterality Date  . Cardiac catheterization  April 2011    questionable occlusion of the most apical portion of the LAD / LV dysfunction out of proportion to coronary disease  . Wrist surgey      left wrist  . Laparoscopic gastrostomy N/A 02/02/2014    Procedure: LAPAROSCOPIC GASTROSTOMY TUBE PLACEMENT;  Surgeon: Pedro Earls, MD;  Location: [facility];  Service: General;  Laterality: N/A;   Prior to Admission medications   Medication Sig Start Date End Date Taking? Authorizing Provider  aspirin EC 81 MG tablet 81 mg by PEG Tube route every morning.    Yes Historical Provider, MD  ceFAZolin (ANCEF) 2-3 GM-% SOLR Inject 50 mLs (2 g total) into the vein every 8 (eight) hours. With last day on 03/31/14 03/08/14  Yes Orson Eva, MD  folic acid (FOLVITE) 1 MG tablet Place 1 tablet (1 mg total) into feeding tube daily. 03/08/14  Yes Orson Eva, MD  insulin NPH Human (NOVOLIN N) 100 UNIT/ML injection Inject 0.1 mLs (10 Units total) into the skin daily at 8 pm. 03/08/14  Yes Orson Eva, MD  Multiple Vitamin (MULTIVITAMIN WITH MINERALS) TABS tablet 1 tablet by PEG Tube route daily.    Yes Historical Provider, MD  nicotine (NICODERM CQ - DOSED IN MG/24 HOURS) 21 mg/24hr  patch Place 21 mg onto the skin daily.   Yes Historical Provider, MD  Nutritional Supplements (FEEDING SUPPLEMENT, OSMOLITE 1.5 CAL,) LIQD 1,000 mLs by PEG Tube route continuous. 115 ml/hr  For 10 hrs  (8pm-6am)   Yes Historical Provider, MD  omeprazole-sodium bicarbonate (ZEGERID) 40-1100 MG per capsule Take 1 capsule by mouth See admin instructions. Via Peg-tube once daily   Yes Historical Provider, MD  sucralfate (CARAFATE) 1 GM/10ML suspension 1 g by PEG Tube route 3 (three) times daily. FOR GASTRIC PROTECTION   Yes Historical Provider, MD  terazosin (HYTRIN) 1 MG capsule 1 mg by PEG Tube route at bedtime.     Yes Historical Provider, MD  thiamine (VITAMIN B-1) 100 MG tablet 100 mg by PEG Tube route daily.    Yes Historical Provider, MD  Water For Irrigation, Sterile (STERILE WATER FOR IRRIGATION) Irrigate with 360 mLs as directed every 8 (eight) hours.   Yes Historical Provider, MD  HYDROcodone-acetaminophen (HYCET) 7.5-325 mg/15 ml solution Place 15 mLs into feeding tube every 6 (six) hours as needed (FOR PAIN RELATED TO MALIGNANT NEOPLASM OF ESOPHAGUS). 03/09/14   Blanchie Serve, MD  lidocaine-prilocaine (EMLA) cream Apply 1 application topically as needed (port access.). 01/20/14   Wyatt Portela, MD  metoCLOPramide (REGLAN) 5 MG tablet Take 5 mg by mouth every 8 (eight) hours as needed for nausea.    Historical Provider, MD  nitroGLYCERIN (NITROSTAT) 0.4 MG SL tablet Place 0.4 mg under the tongue every 5 (five) minutes x 3 doses as needed for chest pain.  01/27/14   Carlena Bjornstad, MD  ondansetron (ZOFRAN-ODT) 4 MG disintegrating tablet 4 mg by PEG Tube route every 8 (eight) hours as needed for nausea or vomiting.     Historical Provider, MD  PRESCRIPTION MEDICATION 120 mLs by PEG Tube route 3 (three) times daily. Med plus    Historical Provider, MD   No Known Allergies  FAMILY HISTORY:  Family History  Problem Relation Age of Onset  . Diabetes Cousin   . Cancer Mother    SOCIAL HISTORY:  reports that he quit smoking about 8 weeks ago. His smoking use included Cigarettes. He has a 15 pack-year smoking history. He has never used smokeless tobacco. He reports that he does not drink alcohol or use illicit drugs.  REVIEW OF SYSTEMS:   10 point review of system taken, please see HPI for positives and negatives.   SUBJECTIVE:  C/o global pain, new onset diarrhea  VITAL SIGNS: Temp:  [97.5 F (36.4 C)-97.8 F (36.6 C)] 97.8 F (36.6 C) (09/16 0524) Pulse Rate:  [49-112] 96 (09/16 0638) Resp:  [22-44] 24 (09/16 0559) BP: (101-139)/(53-96) 112/63 mmHg (09/16 0638) SpO2:  [89 %-100 %] 100 %  (09/16 3546) Weight:  [113 lb (51.256 kg)] 113 lb (51.256 kg) (09/16 0331) HEMODYNAMICS:   VENTILATOR SETTINGS:   INTAKE / OUTPUT: No intake or output data in the 24 hours ending 03/10/2014 0748  PHYSICAL EXAMINATION: General:  Frail wasted AAM, awake and follows commands. NAD at rest. Neuro:  Intact HEENT:  Edentulous, no lan  Cardiovascular: HSR RRR Lungs: Decreased bs bases Abdomen:  Peg in place, soft + bs Musculoskeletal:  intact Skin:  warm  LABS:  CBC  Recent Labs Lab 03/30/2014 0324  WBC 7.6  HGB 7.5*  HCT 22.6*  PLT 20*   Coag's No results found for this basename: APTT, INR,  in the last 168 hours BMET  Recent Labs Lab 03/06/2014 0324  NA 135*  K 4.6  CL 94*  CO2 15*  BUN 45*  CREATININE 1.34  GLUCOSE 94   Electrolytes  Recent Labs Lab 03/08/2014 0324  CALCIUM 8.2*   Sepsis Markers  Recent Labs Lab 03/06/2014 0356  LATICACIDVEN 10.42*   ABG  Recent Labs Lab 03/27/2014 0332  PHART 7.411  PCO2ART 23.7*  PO2ART 214.0*   Liver Enzymes  Recent Labs Lab 03/02/2014 0324  AST 842*  ALT 101*  ALKPHOS 194*  BILITOT 1.2  ALBUMIN 2.7*   Cardiac Enzymes  Recent Labs Lab 03/13/2014 0324  PROBNP 29933.0*   Glucose No results found for this basename: GLUCAP,  in the last 168 hours  Imaging No results found.   ASSESSMENT / PLAN:  PULMONARY OETT NA A: New R focal PNA vs mass, ? Potential embolic process COPD P:   Agree with treatment w abx, follow lung opacity for resolution. Certainly at risk for metastatic disease, but appearance also suggestive of embolic PNA BD's and O2 as needed DC Nicoderm patch   CARDIOVASCULAR CVL rt picc 9/6>> A:  Hx CHF with admit bnp 30 k Low EF by last 2 d (03-01-14) ef 15% with global LV hypokinesis Suspected sepsis without shock, ? source ? SBE check 2 d to rule out P:  Fluids as needed Check procalcitonin Check c diff now Will check TTE to look for vegetations; TEE likely not possible due to his  esophageal CA  RENAL A:   Metabolic acidosis / lactic acidosis with anion gap. Resp compensation. Etiology unclear but worrisome for infxn and sepsis. Consider also hepatic injury, meds P:   Follow lactic acid for clearance Check UDS Hydration despite CHF  GASTROINTESTINAL A:   Esophogeal cancer PEG tube P:   Continue tube feeds  HEMATOLOGIC A:   Known squamous cell esophogeal cancer post chemo/RTX New : possible spiculated 2.8 cm pleural-based mass within the right middle lobe vs infectious process P:  Consider hemonc eval May need Biopsy depending on resolution with abx  INFECTIOUS A:   Known staph aureus bacteremia on Cefazolin as outpatient. New diarrhea  At risk for endocarditis P:   BCx2 9/16 >> UC 9/16>> Sputum 9/16 c dif>>  vanc 9/16>> zoysn 9/16>>  abx as above Check TTE  ENDOCRINE A:  DM P:   SSI Per IM  NEUROLOGIC A:   Intact with RASS 2 P:   RASS goal: 1 No acute issues  TODAY'S SUMMARY: Lactic acidosis with associated dyspnea, currently compensated. ? Sepsis with source unclear but concerned about possible C diff or endocarditis with embolic PNA.   I have personally obtained a history, examined the patient, evaluated laboratory and imaging results, formulated the assessment and plan and placed orders.  CRITICAL CARE: The patient is critically ill with multiple organ systems failure and requires high complexity decision making for assessment and support, frequent evaluation and titration of therapies, application of advanced monitoring technologies and extensive interpretation of multiple databases. Critical Care Time devoted to patient care services described in this note is 60 minutes.   Baltazar Apo, MD, PhD 03/26/2014, 10:27 AM [facility] [phone] or if no answer [phone]

## 2014-03-16 NOTE — ED Notes (Signed)
Pt repositioned and pillows provided he is now resting. Lights dimmed for comfort.

## 2014-03-16 NOTE — Progress Notes (Signed)
ANTIBIOTIC CONSULT NOTE - INITIAL  Pharmacy Consult for Vancomycin Indication: Sepsis  No Known Allergies  Patient Measurements: Height: 5' 6.14" (168 cm) Weight: 113 lb (51.256 kg) IBW/kg (Calculated) : 64.13  Vital Signs: Temp: 97.8 F (36.6 C) (09/16 0524) Temp src: Oral (09/16 0524) BP: 112/63 mmHg (09/16 9211) Pulse Rate: 96 (09/16 0638) Intake/Output from previous day:   Intake/Output from this shift:    Labs:  Recent Labs  03/29/2014 0324  WBC 7.6  HGB 7.5*  PLT 20*  CREATININE 1.34   Estimated Creatinine Clearance: 40.4 ml/min (by C-G formula based on Cr of 1.34). No results found for this basename: VANCOTROUGH, Corlis Leak, VANCORANDOM, [place], GENTPEAK, GENTRANDOM, TOBRATROUGH, TOBRAPEAK, TOBRARND, AMIKACINPEAK, AMIKACINTROU, AMIKACIN,  in the last 72 hours   Microbiology: Recent Results (from the past 720 hour(s))  CULTURE, BLOOD (ROUTINE X 2)     Status: None   Collection Time    02/28/14  4:36 PM      Result Value Ref Range Status   Specimen Description BLOOD RIGHT HAND   Final   Special Requests BOTTLES DRAWN AEROBIC AND ANAEROBIC 3 ML   Final   Culture  Setup Time     Final   Value: 02/28/2014 18:52     Performed at [facility]   Culture     Final   Value: STAPHYLOCOCCUS AUREUS     Note: SUSCEPTIBILITIES PERFORMED ON PREVIOUS CULTURE WITHIN THE LAST 5 DAYS.     Note: Gram Stain Report Called to,Read Back By and Verified With:  LISA FREY @[id number]  03/01/14 SMIAS     Performed at [facility]   Report Status 03/03/2014 FINAL   Final  CULTURE, BLOOD (ROUTINE X 2)     Status: None   Collection Time    02/28/14  4:36 PM      Result Value Ref Range Status   Specimen Description BLOOD RIGHT PORT   Final   Special Requests BOTTLES DRAWN AEROBIC AND ANAEROBIC 5 ML   Final   Culture  Setup Time     Final   Value: 02/28/2014 18:53     Performed at [facility]   Culture     Final   Value: STAPHYLOCOCCUS AUREUS     Note: RIFAMPIN  AND GENTAMICIN SHOULD NOT BE USED AS SINGLE DRUGS FOR TREATMENT OF STAPH INFECTIONS.     Note: Gram Stain Report Called to,Read Back By and Verified With:  Clydene Fake [phone] 03/01/14 SMIAS     Performed at [facility]   Report Status 03/03/2014 FINAL   Final   Organism ID, Bacteria STAPHYLOCOCCUS AUREUS   Final  MRSA PCR SCREENING     Status: None   Collection Time    02/28/14  9:33 PM      Result Value Ref Range Status   MRSA by PCR NEGATIVE  NEGATIVE Final   Comment:            The GeneXpert MRSA Assay (FDA     approved for NASAL specimens     only), is one component of a     comprehensive MRSA colonization     surveillance program. It is not     intended to diagnose MRSA     infection nor to guide or     monitor treatment for     MRSA infections.  CULTURE, BLOOD (ROUTINE X 2)     Status: None   Collection Time    03/02/14 10:30 AM  Result Value Ref Range Status   Specimen Description BLOOD PAC   Final   Special Requests BOTTLES DRAWN AEROBIC AND ANAEROBIC 10CC   Final   Culture  Setup Time     Final   Value: 03/02/2014 13:24     Performed at [facility]   Culture     Final   Value: NO GROWTH 5 DAYS     Performed at [facility]   Report Status 03/08/2014 FINAL   Final  CULTURE, BLOOD (ROUTINE X 2)     Status: None   Collection Time    03/02/14 10:40 AM      Result Value Ref Range Status   Specimen Description BLOOD RIGHT HAND   Final   Special Requests BOTTLES DRAWN AEROBIC AND ANAEROBIC [facility]   Final   Culture  Setup Time     Final   Value: 03/02/2014 13:24     Performed at [facility]   Culture     Final   Value: NO GROWTH 5 DAYS     Performed at [facility]   Report Status 03/08/2014 FINAL   Final  CATH TIP CULTURE     Status: None   Collection Time    03/04/14  5:52 PM      Result Value Ref Range Status   Specimen Description CATH TIP   Final   Special Requests Normal   Final   Culture     Final   Value: >100  COLONIES STAPHYLOCOCCUS AUREUS     Note: RIFAMPIN AND GENTAMICIN SHOULD NOT BE USED AS SINGLE DRUGS FOR TREATMENT OF STAPH INFECTIONS.     Performed at [facility]   Report Status 03/08/2014 FINAL   Final   Organism ID, Bacteria STAPHYLOCOCCUS AUREUS   Final  CLOSTRIDIUM DIFFICILE BY PCR     Status: None   Collection Time    03/05/14  3:58 PM      Result Value Ref Range Status   C difficile by pcr NEGATIVE  NEGATIVE Final   Comment: Performed at [place] History: Past Medical History  Diagnosis Date  . Chronic pancreatitis     CT scan shows improving pancreatitis  . Nonischemic cardiomyopathy 09/2009    Catheterization, April, 2011, questionable occlusion of the most apical portion of the LAD, LV dysfunction out of proportion to coronary disease  . Chronic systolic CHF (congestive heart failure)     April, 2011 mild troponin elevation at that time  . Mitral regurgitation     mild, echo April 2011  . Tobacco abuse   . Alcohol use     heavy until Jan 2011, patient was counseled concerning alcohol in April 2011  . COPD (chronic obstructive pulmonary disease)   . Fluid overload 03/2010    October, 2011  . Cough Jan 2012    may be from enalapril, January, 2012  . Ejection fraction < 50%     EF 35-40%, echo, April, 2011  . CAD (coronary artery disease)     Catheterization, April, 2011, questionable occlusion of the most apical portion of the LAD, LV dysfunction out of proportion to coronary disease  . Squamous cell esophageal cancer   . Severe protein-calorie malnutrition 01/31/2014    Medications:  Infusions:  Anti-infectives   Start     Dose/Rate Route Frequency Ordered Stop   03/06/2014 0400  vancomycin (VANCOCIN) IVPB 1000 mg/200 mL premix     1,000 mg 200  mL/hr over 60 Minutes Intravenous  Once 03/03/2014 0358 03/19/2014 0717   03/27/2014 0400  piperacillin-tazobactam (ZOSYN) IVPB 3.375 g     3.375 g 100 mL/hr over 30 Minutes Intravenous  Once  03/03/2014 0358 03/27/2014 0557     Assessment: 63yoM presenting with shortness of breath and productive cough.  PMH esophageal cancer s/p PEG.  Discharged a week ago after treatment for HCAP and AECOPD; at that visit was started on vanc/Zosyn x 3 days, then to continue cefazolin 2g IV q8hr through Oct 1 for suspected CRBSI MSSA bacteremia.  Now noted to have anemia/thrombocytopenia from possible hemolysis. Also with CHF exacerbation and transaminitis, likely from demand ischemia.  9/16 >> vancomycin >> 9/16 >> Zosyn >>     Tmax: Afebrile WBCs: wnl Renal: Elevated since previous discharge; CrCl 40 ml/min (baseline appears to be  ~60 ml/min  9/16 blood: pending x 2 9/16 urine: pending No sputum Cx    Goal of Therapy:  Vancomycin trough level 15-20 mcg/ml Eradication of infection; appropriate renal dosing of antibiotics  Plan:   Vancomycin 500 mg IV q12 starting at 1800 today  Zosyn 3.375 mg IV q8h EI starting at 1300 today  Measure antibiotic drug levels at steady state  Follow up culture results  Vancomycin is associated with both acute kidney injury and thrombocytopenia; monitor patient parameters closely.  Reuel Boom, PharmD Pager: [phone] 03/20/2014, 7:56 AM

## 2014-03-16 NOTE — Telephone Encounter (Signed)
Mr. Michael Keith returned call to [facility] about Mr. Howley. He will check with him to see if he is available to come in for an appt on 04/07/14. He stated paient is currently schedule with Dr. Isidore Moos on 04/08/14. Mr. Michael Keith will call back to let us know.

## 2014-03-17 ENCOUNTER — Telehealth: Payer: Self-pay | Admitting: *Deleted

## 2014-03-17 DIAGNOSIS — I369 Nonrheumatic tricuspid valve disorder, unspecified: Secondary | ICD-10-CM

## 2014-03-17 LAB — COMPREHENSIVE METABOLIC PANEL
ALBUMIN: 2.3 g/dL — AB (ref 3.5–5.2)
ALK PHOS: 153 U/L — AB (ref 39–117)
ALT: 120 U/L — AB (ref 0–53)
ANION GAP: 14 (ref 5–15)
AST: 1466 U/L — ABNORMAL HIGH (ref 0–37)
BILIRUBIN TOTAL: 0.7 mg/dL (ref 0.3–1.2)
BUN: 56 mg/dL — ABNORMAL HIGH (ref 6–23)
CHLORIDE: 100 meq/L (ref 96–112)
CO2: 25 mEq/L (ref 19–32)
Calcium: 8 mg/dL — ABNORMAL LOW (ref 8.4–10.5)
Creatinine, Ser: 1.97 mg/dL — ABNORMAL HIGH (ref 0.50–1.35)
GFR calc Af Amer: 40 mL/min — ABNORMAL LOW (ref >90)
GFR calc non Af Amer: 34 mL/min — ABNORMAL LOW (ref >90)
Glucose, Bld: 141 mg/dL — ABNORMAL HIGH (ref 70–99)
POTASSIUM: 3.9 meq/L (ref 3.7–5.3)
SODIUM: 139 meq/L (ref 137–147)
Total Protein: 5.8 g/dL — ABNORMAL LOW (ref 6.0–8.3)

## 2014-03-17 LAB — CBC
HCT: 18.8 % — ABNORMAL LOW (ref 39.0–52.0)
Hemoglobin: 6.3 g/dL — CL (ref 13.0–17.0)
MCH: 30.6 pg (ref 26.0–34.0)
MCHC: 33.5 g/dL (ref 30.0–36.0)
MCV: 91.3 fL (ref 78.0–100.0)
PLATELETS: 9 10*3/uL — AB (ref 150–400)
RBC: 2.06 MIL/uL — AB (ref 4.22–5.81)
RDW: 17.6 % — ABNORMAL HIGH (ref 11.5–15.5)
WBC: 5.2 10*3/uL (ref 4.0–10.5)

## 2014-03-17 LAB — GLUCOSE, CAPILLARY
GLUCOSE-CAPILLARY: 129 mg/dL — AB (ref 70–99)
Glucose-Capillary: 131 mg/dL — ABNORMAL HIGH (ref 70–99)
Glucose-Capillary: 138 mg/dL — ABNORMAL HIGH (ref 70–99)
Glucose-Capillary: 149 mg/dL — ABNORMAL HIGH (ref 70–99)
Glucose-Capillary: 163 mg/dL — ABNORMAL HIGH (ref 70–99)
Glucose-Capillary: 172 mg/dL — ABNORMAL HIGH (ref 70–99)

## 2014-03-17 LAB — URINE CULTURE
Colony Count: NO GROWTH
Culture: NO GROWTH

## 2014-03-17 LAB — LACTIC ACID, PLASMA: Lactic Acid, Venous: 2.4 mmol/L — ABNORMAL HIGH (ref 0.5–2.2)

## 2014-03-17 LAB — LEGIONELLA ANTIGEN, URINE: Legionella Antigen, Urine: NEGATIVE

## 2014-03-17 LAB — PROCALCITONIN: Procalcitonin: 3.96 ng/mL

## 2014-03-17 LAB — EXPECTORATED SPUTUM ASSESSMENT W REFEX TO RESP CULTURE

## 2014-03-17 LAB — PREPARE RBC (CROSSMATCH)

## 2014-03-17 LAB — EXPECTORATED SPUTUM ASSESSMENT W GRAM STAIN, RFLX TO RESP C

## 2014-03-17 MED ORDER — IPRATROPIUM BROMIDE 0.02 % IN SOLN: 0.5000 mg | RESPIRATORY_TRACT | Status: DC | PRN

## 2014-03-17 MED ORDER — SODIUM CHLORIDE 0.9 % IV SOLN: Freq: Once | INTRAVENOUS | Status: DC

## 2014-03-17 MED ORDER — LEVALBUTEROL HCL 0.63 MG/3ML IN NEBU
0.6300 mg | INHALATION_SOLUTION | RESPIRATORY_TRACT | Status: DC | PRN
  Administered 2014-03-21: 0.63 mg via RESPIRATORY_TRACT
  Filled 2014-03-17: qty 3

## 2014-03-17 MED ORDER — SODIUM CHLORIDE 0.9 % IJ SOLN: 10.0000 mL | INTRAMUSCULAR | Status: DC | PRN

## 2014-03-17 MED ORDER — VANCOMYCIN HCL 500 MG IV SOLR
500.0000 mg | INTRAVENOUS | Status: DC
  Administered 2014-03-18 – 2014-03-19 (×2): 500 mg via INTRAVENOUS
  Filled 2014-03-17 (×2): qty 500

## 2014-03-17 MED ORDER — SODIUM CHLORIDE 0.9 % IJ SOLN
10.0000 mL | Freq: Two times a day (BID) | INTRAMUSCULAR | Status: DC
  Administered 2014-03-17 – 2014-03-21 (×5): 10 mL

## 2014-03-17 NOTE — Telephone Encounter (Signed)
Received call from patient's cousin, Michael Keith.  He wanted to be sure I was aware of his readmission to [facility], expressed concern about this set back.  He reported that Michael Keith has been gaining weight "up to 118 lbs" which he finds to be encouraging. I will continue to f/u with Michael Keith, maintain communication with Michael Keith.  [name], RN, BSN, [place] [facility] [place] [phone]

## 2014-03-17 NOTE — Progress Notes (Signed)
PULMONARY / CRITICAL CARE MEDICINE   Name: Michael Keith MRN: [id number] DOB: 12-09-1950    ADMISSION DATE:  03/23/2014 CONSULTATION DATE: 9/16  REFERRING MD :  [facility]  CHIEF COMPLAINT: SOB  INITIAL PRESENTATION:Sob acute, lactic acid 10.  STUDIES:  9/16 2 d >>  SIGNIFICANT EVENTS: 9/16 admit to ICU with metabolic acidosis.    HISTORY OF PRESENT ILLNESS:   63 yo AAM with known squamous cell esophogeal cancer, last chemo July 2015 and Rtx June 2015, just discharged from hospital 03/08/14. Noted to have MSSA bacteriemia with removal of port and placement of PICC. Discharged on cefazolin IV with planned stop date 03/31/14.  He presents to [facility] ED  9/16 with acute SOB, negative PE protocol CT of chest but with new spiculaed mass vs nodular PNA. He has a metabolic acidosis with respiratory compensation (lactic acid 10.41). He has had antimicrobials changed to broad spectrum. He is current hemodynamically stable and PCCM asked to consult.  SUBJECTIVE:  Wants to be intubated if needed  VITAL SIGNS: Temp:  [97.1 F (36.2 C)-97.9 F (36.6 C)] 97.6 F (36.4 C) (09/17 0400) Pulse Rate:  [29-103] 35 (09/17 0600) Resp:  [19-33] 31 (09/17 0600) BP: (94-126)/(44-99) 99/44 mmHg (09/17 0600) SpO2:  [72 %-100 %] 93 % (09/17 0605) FiO2 (%):  [100 %] 100 % (09/17 0000) Weight:  [114 lb 6.7 oz (51.9 kg)-118 lb (53.524 kg)] 118 lb (53.524 kg) (09/17 0400) HEMODYNAMICS:   VENTILATOR SETTINGS: Vent Mode:  [-]  FiO2 (%):  [100 %] 100 % INTAKE / OUTPUT:  Intake/Output Summary (Last 24 hours) at 03/17/14 0815 Last data filed at 03/17/14 6962  Gross per 24 hour  Intake   2773 ml  Output    550 ml  Net   2223 ml    PHYSICAL EXAMINATION: General:  Frail wasted AAM, awake and follows commands. NAD at rest. Somewhat stunned. Neuro:  Intact HEENT:  Edentulous, no lan  Cardiovascular: HSR RRR Lungs: Decreased bs bases, + congestion Abdomen:  Peg in place, soft + bs Musculoskeletal:  intact Skin:   Warm, co pain in feet, mild lower ext edema  LABS:  CBC  Recent Labs Lab 03/30/2014 0324 03/03/2014 0712 03/17/14 0419  WBC 7.6 7.1 5.2  HGB 7.5* 7.1* 6.3*  HCT 22.6* 21.4* 18.8*  PLT 20* 19* 9*   Coag's No results found for this basename: APTT, INR,  in the last 168 hours BMET  Recent Labs Lab 03/01/2014 0324 03/17/14 0419  NA 135* 139  K 4.6 3.9  CL 94* 100  CO2 15* 25  BUN 45* 56*  CREATININE 1.34 1.97*  GLUCOSE 94 141*   Electrolytes  Recent Labs Lab 03/24/2014 0324 03/17/14 0419  CALCIUM 8.2* 8.0*   Sepsis Markers  Recent Labs Lab 03/08/2014 0356 03/08/2014 0712 03/17/14 0419  LATICACIDVEN 10.42*  --   --   PROCALCITON  --  1.82 3.96   ABG  Recent Labs Lab 03/08/2014 0332  PHART 7.411  PCO2ART 23.7*  PO2ART 214.0*   Liver Enzymes  Recent Labs Lab 03/20/2014 0324 03/17/14 0419  AST 842* 1466*  ALT 101* 120*  ALKPHOS 194* 153*  BILITOT 1.2 0.7  ALBUMIN 2.7* 2.3*   Cardiac Enzymes  Recent Labs Lab 03/10/2014 0324  PROBNP 29933.0*   Glucose  Recent Labs Lab 03/24/2014 1919 03/17/14 0027 03/17/14 0415  GLUCAP 131* 163* 149*    Imaging Ct Angio Chest Pe W/cm &/or Wo Cm  03/15/2014   CLINICAL DATA:  Worsening  shortness of breath. Recently diagnosed esophageal cancer, status post chemotherapy and radiation therapy.  EXAM: CT ANGIOGRAPHY CHEST WITH CONTRAST  TECHNIQUE: Multidetector CT imaging of the chest was performed using the standard protocol during bolus administration of intravenous contrast. Multiplanar CT image reconstructions and MIPs were obtained to evaluate the vascular anatomy.  CONTRAST:  34mL OMNIPAQUE IOHEXOL 350 MG/ML SOLN  COMPARISON:  PET/CT performed 12/21/2013  FINDINGS: There is no evidence of pulmonary embolus.  There appears to be a spiculated 2.8 cm pleural-based mass within the right middle lobe, new from the prior study. There is mild associated architectural distortion. This may simply reflect scarring, but malignancy  cannot be excluded. Small bilateral pleural effusions are seen, with associated atelectasis.  Emphysematous change is noted bilaterally, most prominent at the upper lung lobes. No pneumothorax is seen.  Biatrial enlargement is noted. There is suggestion of an enlarged subcarinal node, measuring 1.6 cm in short axis. No additional mediastinal lymphadenopathy is seen. No pericardial effusion is identified. The great vessels are grossly unremarkable in appearance. A right PICC is noted ending about the mid to distal SVC.  The previously noted proximal esophageal mass is no longer well seen, though this is nonspecific as the esophagus is decompressed at this level. The thyroid gland is grossly unremarkable in appearance. No axillary lymphadenopathy is seen.  There is a new nonspecific 1.9 cm subcutaneous soft tissue nodule at the upper inner quadrant of the right breast. Would correlate for associated skin findings.  There is mild reflux of contrast into the hepatic veins and IVC. The visualized portions of the liver and spleen are grossly unremarkable. Trace ascites is noted within the upper abdomen.  No acute osseous abnormalities are seen.  Review of the MIP images confirms the above findings.  IMPRESSION: 1. No evidence of pulmonary embolus. 2. Apparent spiculated 2.8 cm pleural-based mass within the right middle lung lobe, new from the prior study. Mild associated architectural distortion noted. This may simply reflect scarring, though malignancy cannot be excluded. Follow-up PET/CT would be helpful for further evaluation. 3. Small bilateral pleural effusions with associated atelectasis. 4. Emphysematous change bilaterally, most prominent at the upper lung lobes. 5. Biatrial enlargement noted. 6. Suggestion of enlarged subcarinal node measuring 1.6 cm in short axis, of uncertain significance. 7. Previously noted proximal esophageal mass is no longer well seen, though this is nonspecific as the esophagus is  decompressed at this level. This could also be further assessed on PET/CT. 8. New nonspecific 1.9 cm subcutaneous soft tissue nodule at the upper inner quadrant of the right breast. Would correlate for associated skin findings. 9. Trace ascites within the upper abdomen.   Electronically Signed   By: Garald Balding M.D.   On: 03/05/2014 06:06   Dg Chest Port 1 View  03/23/2014   CLINICAL DATA:  Shortness of breath.  EXAM: PORTABLE CHEST - 1 VIEW  COMPARISON:  Chest radiograph performed 02/28/2014  FINDINGS: The lungs are hyperexpanded, with flattening of the hemidiaphragms, compatible with COPD. A small right pleural effusion is noted. Right-sided airspace opacification has largely resolved, with minimal residual right basilar opacity seen. No pneumothorax is seen. The left costophrenic angle is incompletely imaged on this study.  The cardiomediastinal silhouette is mildly enlarged. No acute osseous abnormalities are seen. A right PICC is noted ending about the mid to distal SVC.  IMPRESSION: 1. Right-sided airspace opacification has largely resolved, with minimal residual right basilar airspace opacity seen. Small right pleural effusion noted. 2. Findings of COPD.  3. Mild cardiomegaly.   Electronically Signed   By: Garald Balding M.D.   On: 03/30/2014 03:51     ASSESSMENT / PLAN:  PULMONARY OETT NA A: New R focal PNA vs mass, ? Potential embolic process COPD P:   Agree with treatment w abx, follow lung opacity for resolution. Certainly at risk for metastatic disease, but appearance also suggestive of embolic PNA BD's and O2 as needed DC Nicoderm patch   CARDIOVASCULAR CVL rt picc 9/6>> A:  Hx CHF with admit bnp 30 k Low EF by last TTE (03-01-14) ef 15% with global LV hypokinesis Suspected sepsis without shock, ? source ? SBE  P:  Fluids as needed Will check TTE to look for vegetations; TEE likely not possible due to his esophageal CA  RENAL A:   Metabolic acidosis / lactic acidosis with  anion gap. Resp compensation. Etiology unclear but worrisome for infxn and sepsis. Consider also hepatic injury, meds > improving with volume P:   Hydration despite CHF  GASTROINTESTINAL A:   Esophogeal cancer PEG tube P:   Continue tube feeds  HEMATOLOGIC A:   Known squamous cell esophogeal cancer post chemo/RTX New : possible spiculated 2.8 cm pleural-based mass within the right middle lobe vs infectious process Anemia   Recent Labs  03/20/2014 0712 03/17/14 0419  HGB 7.1* 6.3*   P:  Consider hemonc eval May need Biopsy depending on resolution of RLL lesion with abx 9/17 transfusion per protocol   INFECTIOUS A:   Known staph aureus bacteremia on Cefazolin as outpatient. New diarrhea, C diff negative At risk for endocarditis P:   BCx2 9/16 >> UC 9/16>> Sputum 9/16>> 9/16 c dif>>neg 9/17 procal>>3.97  Abx vanc 9/16>> zoysn 9/16>>  abx as above Check TTE  ENDOCRINE A:  DM P:   SSI Per IM  NEUROLOGIC A:   Intact with RASS 1-2 P:   RASS goal: 1 No acute issues  TODAY'S SUMMARY: Remains full code but is going to discuss with family. O2 sats are low but suspect not accurate. C diff is negative. He does have esophogeal cancer and has completed rtx/chemo. Will transfer to [facility] as of 9/16 am. PCCM can be available as needed.  Richardson Landry Minor ACNP Maryanna Shape PCCM Pager [phone] till 3 pm If no answer page [phone] 03/17/2014, 8:25 AM  Baltazar Apo, MD, PhD 03/17/2014, 12:09 PM [facility] [phone] or if no answer [phone]

## 2014-03-17 NOTE — Progress Notes (Signed)
Patient ID: Michael Keith, male   DOB: October 26, 1950, 64 y.o.   MRN: [id number] Ethilon sutures removed in their entirety from prev rt upper chest wall PAC site. No immediate complications. Site clean and dry.

## 2014-03-17 NOTE — Progress Notes (Signed)
Clinical Social Work Department BRIEF PSYCHOSOCIAL ASSESSMENT 03/17/2014  Patient:  Michael Keith, Michael Keith     Account Number:  [id number]     Admit date:  03/09/2014  Clinical Social Worker:  Ulyess Blossom  Date/Time:  03/17/2014 03:00 PM  Referred by:  Physician  Date Referred:  03/17/2014 Referred for  SNF Placement   Other Referral:   Interview type:  Patient Other interview type:    PSYCHOSOCIAL DATA Living Status:  FACILITY Admitted from facility:  [facility] Level of care:  [place] Primary support name:  Michael Keith Primary support relationship to patient:  FAMILY Degree of support available:   adequate    CURRENT CONCERNS Current Concerns  Post-Acute Placement   Other Concerns:    SOCIAL WORK ASSESSMENT / PLAN CSW received referral that pt admitted from [facility].    CSW met with pt at bedside. Pt sleeping upon CSW arrival, but woke upon CSW entering the room. CSW familiar with pt from previous admission. CSW introduced self and explained role. Pt confirmed that he was a resident at [facility]. CSW inquired with pt if [place] has been a better experience than pt previous SNF experience and pt indicated it had been. Pt answers questions appropriately, but did not feel well enough to get deeply engaged in conversation. Pt states that pt cousin continues to be supportive. CSW provided support and discussed with pt that CSW will continue to follow while pt in the hospital to provide support and assist with pt disposition planning.    CSW completed FL2 and will continue to follow to provide support and assist with disposition planning as appropriate.   Assessment/plan status:  Psychosocial Support/Ongoing Assessment of Needs Other assessment/ plan:   discharge planning   Information/referral to community resources:   Referral back to [facility] when appropriate    PATIENT'S/FAMILY'S RESPONSE TO PLAN OF CARE: Pt  alert and oriented x 4. Pt sleeping upon arrival and drowsy during conversation. Pt expresses that [facility] has been a better experience as pt just transferred to [facility] recently after have been at another SNF where pt was not satisfied.    Michael Keith, MSW, [place] Work [phone]

## 2014-03-17 NOTE — Progress Notes (Signed)
CRITICAL VALUE ALERT  Critical value received:  Hgb 6.3, Plts-9  Date of notification:  03/17/14  Time of notification:  0506  Critical value read back:Yes.    Nurse who received alert:  S.Young,RN  MD notified (1st page):  Josephine Cables  Time of first page:  0520  MD notified (2nd page):  Time of second page:  Responding MD:  Josephine Cables  Time MD responded:  3299

## 2014-03-17 NOTE — Progress Notes (Signed)
Pharmacist Heart Failure Core Measure Documentation  Assessment: Michael Keith has an EF documented as 15% on 03/01/14 by 2D ECHO.  Rationale: Heart failure patients with left ventricular systolic dysfunction (LVSD) and an EF < 40% should be prescribed an angiotensin converting enzyme inhibitor (ACEI) or angiotensin receptor blocker (ARB) at discharge unless a contraindication is documented in the medical record.  This patient is not currently on an ACEI or ARB for HF.  This note is being placed in the record in order to provide documentation that a contraindication to the use of these agents is present for this encounter.  ACE Inhibitor or Angiotensin Receptor Blocker is contraindicated (specify all that apply)  []   ACEI allergy AND ARB allergy []   Angioedema []   Moderate or severe aortic stenosis []   Hyperkalemia []   Hypotension []   Renal artery stenosis [x]   Worsening renal function, preexisting renal disease or dysfunction   Hershal Coria 03/17/2014 9:22 AM

## 2014-03-17 NOTE — Progress Notes (Signed)
Echo Lab  2D Echocardiogram completed.  Dupree Givler L Lyvonne Cassell, RDCS 03/17/2014 10:56 AM

## 2014-03-17 NOTE — Progress Notes (Signed)
Peripherally Inserted Central Catheter/Midline Placement  The IV Nurse has discussed with the patient and/or persons authorized to consent for the patient, the purpose of this procedure and the potential benefits and risks involved with this procedure.  The benefits include less needle sticks, lab draws from the catheter and patient may be discharged home with the catheter.  Risks include, but not limited to, infection, bleeding, blood clot (thrombus formation), and puncture of an artery; nerve damage and irregular heat beat.  Alternatives to this procedure were also discussed.  PICC/Midline Placement Documentation  PICC / Midline Double Lumen 03/17/14 PICC Right 39 cm 0 cm (Active)  Indication for Insertion or Continuance of Line Poor Vasculature-patient has had multiple peripheral attempts or PIVs lasting less than 24 hours 03/17/2014  3:00 PM  Exposed Catheter (cm) 0 cm 03/17/2014  3:00 PM  Dressing Change Due 03/24/14 03/17/2014  3:00 PM       Jule Economy Horton 03/17/2014, 3:52 PM

## 2014-03-17 NOTE — Progress Notes (Signed)
ANTIBIOTIC CONSULT NOTE - Follow Up  Pharmacy Consult for Vancomycin Indication: Sepsis  No Known Allergies  Patient Measurements: Height: 5\' 6"  (167.6 cm) Weight: 118 lb (53.524 kg) IBW/kg (Calculated) : 63.8  Vital Signs: Temp: 97.6 F (36.4 C) (09/17 0400) Temp src: Oral (09/17 0400) BP: 99/44 mmHg (09/17 0600) Pulse Rate: 35 (09/17 0600) Intake/Output from previous day: 09/16 0701 - 09/17 0700 In: 2773 [I.V.:373; NG/GT:1150; IV Piggyback:350] Out: 550 [Urine:550] Intake/Output from this shift:    Labs:  Recent Labs  03/26/2014 0324 03/02/2014 0712 03/17/14 0419  WBC 7.6 7.1 5.2  HGB 7.5* 7.1* 6.3*  PLT 20* 19* 9*  CREATININE 1.34  --  1.97*   Estimated Creatinine Clearance: 28.7 ml/min (by C-G formula based on Cr of 1.97). No results found for this basename: VANCOTROUGH, VANCOPEAK, VANCORANDOM, GENTTROUGH, GENTPEAK, GENTRANDOM, TOBRATROUGH, TOBRAPEAK, TOBRARND, AMIKACINPEAK, AMIKACINTROU, AMIKACIN,  in the last 72 hours   Assessment: 63yoM presenting with shortness of breath and productive cough.  PMH esophageal cancer s/p PEG.  Discharged a week ago after treatment for HCAP and AECOPD; at that visit was started on vanc/Zosyn x 3 days, then to continue cefazolin 2g IV q8hr through Oct 1 for suspected CRBSI MSSA bacteremia.  Now noted to have anemia/thrombocytopenia from possible hemolysis. Also with CHF exacerbation and transaminitis, likely from demand ischemia.  9/3 >> cefazolin >> 9/16 9/16 >> vancomycin >> 9/16 >> Zosyn >>   Tmax: Afebrile WBCs: wnl Renal: AKI, SCr 1.97, CrCl~28CG, ~38N Both LA and PCT elevated.  9/16 blood: pending x 2 9/16 urine: pending 9/16 Cdiff PCR neg 9/16 sputum ordered  Today is day #2 vancomycin 500 mg IV q12h and Zosyn 3.375 g IV q8hr EI for sepsis.  SCr worsened today, warranting an adjustment to vancomycin dose.  Goal of Therapy:  Vancomycin trough level 15-20 mcg/ml Eradication of infection; appropriate renal dosing of  antibiotics  Plan:  1.  Adjust vancomycin to 500 mg IV q24h. 2.  Continue Zosyn 3.375g IV q8h (4 hour infusion time).  3.  F/u SCr, culture results, trough level.  Hershal Coria, PharmD, BCPS Pager: [phone] 03/17/2014 9:18 AM

## 2014-03-17 NOTE — Progress Notes (Signed)
Pt noted to have decreased urine output during the shift. Pt bladder scanned with results showing 136cc in bladder with Foley catheter in place. Penis edematous. Pt currently has no complaints of having the urge to urinate but states that he has pain around the insertion site of the catheter with urinating.

## 2014-03-18 ENCOUNTER — Encounter: Payer: Self-pay | Admitting: *Deleted

## 2014-03-18 ENCOUNTER — Inpatient Hospital Stay (HOSPITAL_COMMUNITY): Payer: Medicare Other

## 2014-03-18 DIAGNOSIS — R7402 Elevation of levels of lactic acid dehydrogenase (LDH): Secondary | ICD-10-CM

## 2014-03-18 DIAGNOSIS — D696 Thrombocytopenia, unspecified: Secondary | ICD-10-CM | POA: Diagnosis present

## 2014-03-18 DIAGNOSIS — D649 Anemia, unspecified: Secondary | ICD-10-CM | POA: Diagnosis present

## 2014-03-18 DIAGNOSIS — B179 Acute viral hepatitis, unspecified: Secondary | ICD-10-CM | POA: Diagnosis present

## 2014-03-18 DIAGNOSIS — R740 Nonspecific elevation of levels of transaminase and lactic acid dehydrogenase [LDH]: Secondary | ICD-10-CM

## 2014-03-18 DIAGNOSIS — Z8501 Personal history of malignant neoplasm of esophagus: Secondary | ICD-10-CM

## 2014-03-18 DIAGNOSIS — N179 Acute kidney failure, unspecified: Secondary | ICD-10-CM | POA: Diagnosis present

## 2014-03-18 LAB — TYPE AND SCREEN
ABO/RH(D): A POS
Antibody Screen: NEGATIVE
UNIT DIVISION: 0
Unit division: 0

## 2014-03-18 LAB — LACTIC ACID, PLASMA: Lactic Acid, Venous: 1.2 mmol/L (ref 0.5–2.2)

## 2014-03-18 LAB — GLUCOSE, CAPILLARY
GLUCOSE-CAPILLARY: 102 mg/dL — AB (ref 70–99)
GLUCOSE-CAPILLARY: 54 mg/dL — AB (ref 70–99)
GLUCOSE-CAPILLARY: 112 mg/dL — AB (ref 70–99)
Glucose-Capillary: 91 mg/dL (ref 70–99)
Glucose-Capillary: 102 mg/dL — ABNORMAL HIGH (ref 70–99)
Glucose-Capillary: 46 mg/dL — ABNORMAL LOW (ref 70–99)
Glucose-Capillary: 93 mg/dL (ref 70–99)
Glucose-Capillary: 93 mg/dL (ref 70–99)

## 2014-03-18 LAB — BASIC METABOLIC PANEL
Anion gap: 13 (ref 5–15)
BUN: 62 mg/dL — AB (ref 6–23)
CO2: 26 mEq/L (ref 19–32)
Calcium: 8.4 mg/dL (ref 8.4–10.5)
Chloride: 100 mEq/L (ref 96–112)
Creatinine, Ser: 2.44 mg/dL — ABNORMAL HIGH (ref 0.50–1.35)
GFR, EST AFRICAN AMERICAN: 31 mL/min — AB (ref >90)
GFR, EST NON AFRICAN AMERICAN: 26 mL/min — AB (ref >90)
Glucose, Bld: 78 mg/dL (ref 70–99)
POTASSIUM: 3.7 meq/L (ref 3.7–5.3)
SODIUM: 139 meq/L (ref 137–147)

## 2014-03-18 LAB — CBC
HCT: 25.2 % — ABNORMAL LOW (ref 39.0–52.0)
HCT: 24.6 % — ABNORMAL LOW (ref 39.0–52.0)
Hemoglobin: 8.6 g/dL — ABNORMAL LOW (ref 13.0–17.0)
Hemoglobin: 8.4 g/dL — ABNORMAL LOW (ref 13.0–17.0)
MCH: 30.2 pg (ref 26.0–34.0)
MCH: 30.2 pg (ref 26.0–34.0)
MCHC: 34.1 g/dL (ref 30.0–36.0)
MCHC: 34.1 g/dL (ref 30.0–36.0)
MCV: 88.4 fL (ref 78.0–100.0)
MCV: 88.5 fL (ref 78.0–100.0)
Platelets: 5 10*3/uL — CL (ref 150–400)
Platelets: 68 10*3/uL — ABNORMAL LOW (ref 150–400)
RBC: 2.85 MIL/uL — ABNORMAL LOW (ref 4.22–5.81)
RBC: 2.78 MIL/uL — ABNORMAL LOW (ref 4.22–5.81)
RDW: 17.3 % — AB (ref 11.5–15.5)
RDW: 17.6 % — ABNORMAL HIGH (ref 11.5–15.5)
WBC: 4.1 10*3/uL (ref 4.0–10.5)
WBC: 4 10*3/uL (ref 4.0–10.5)

## 2014-03-18 LAB — HEPATIC FUNCTION PANEL
ALT: 41 U/L (ref 0–53)
AST: 581 U/L — ABNORMAL HIGH (ref 0–37)
Albumin: 2.4 g/dL — ABNORMAL LOW (ref 3.5–5.2)
Alkaline Phosphatase: 150 U/L — ABNORMAL HIGH (ref 39–117)
BILIRUBIN INDIRECT: 0.6 mg/dL (ref 0.3–0.9)
Bilirubin, Direct: 0.7 mg/dL — ABNORMAL HIGH (ref 0.0–0.3)
TOTAL PROTEIN: 6.3 g/dL (ref 6.0–8.3)
Total Bilirubin: 1.3 mg/dL — ABNORMAL HIGH (ref 0.3–1.2)

## 2014-03-18 LAB — MAGNESIUM: MAGNESIUM: 2.4 mg/dL (ref 1.5–2.5)

## 2014-03-18 LAB — HEPATITIS PANEL, ACUTE
HCV AB: NEGATIVE
HEP A IGM: NONREACTIVE
HEP B C IGM: NONREACTIVE
HEP B S AG: NEGATIVE

## 2014-03-18 LAB — PHOSPHORUS: PHOSPHORUS: 3 mg/dL (ref 2.3–4.6)

## 2014-03-18 LAB — SAVE SMEAR

## 2014-03-18 LAB — PROCALCITONIN: PROCALCITONIN: 3.83 ng/mL

## 2014-03-18 MED ORDER — SODIUM CHLORIDE 0.9 % IV SOLN
Freq: Once | INTRAVENOUS | Status: AC
  Administered 2014-03-18: 11:00:00 via INTRAVENOUS

## 2014-03-18 MED ORDER — OSMOLITE 1.5 CAL PO LIQD: 1000.0000 mL | ORAL | Status: DC

## 2014-03-18 MED ORDER — SODIUM CHLORIDE 0.9 % IV SOLN
INTRAVENOUS | Status: AC
  Administered 2014-03-18: 09:00:00 via INTRAVENOUS
  Administered 2014-03-19: 75 mL via INTRAVENOUS

## 2014-03-18 MED ORDER — OSMOLITE 1.5 CAL PO LIQD
1000.0000 mL | ORAL | Status: DC
  Administered 2014-03-18 – 2014-03-20 (×3): 1000 mL
  Filled 2014-03-18 (×4): qty 1000

## 2014-03-18 MED ORDER — DEXTROSE 50 % IV SOLN
INTRAVENOUS | Status: AC
  Filled 2014-03-18: qty 50

## 2014-03-18 MED ORDER — DEXTROSE 50 % IV SOLN
25.0000 mL | Freq: Once | INTRAVENOUS | Status: AC | PRN
  Administered 2014-03-18: 25 mL via INTRAVENOUS

## 2014-03-18 MED ORDER — DEXTROSE 50 % IV SOLN
INTRAVENOUS | Status: AC
  Administered 2014-03-18: 13:00:00
  Filled 2014-03-18: qty 50

## 2014-03-18 NOTE — Progress Notes (Addendum)
PROGRESS NOTE    Michael Keith EHU:[id number] DOB: September 15, 1950 DOA: 03/15/2014 PCP: Philis Fendt, MD Oncology: Dr. 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 Discharged on cefazolin IV with plan to stop on 03/31/14. He presented to the ED 9/16 with acute dyspnea, negative PE protocol CT of chest but with new spiculated mass versus modular pneumonia, metabolic acidosis with respiratory compensation (lactate 10.4). Critical care team admitted him to ICU and transferred care to hospitalist service on 03/18/14. Upon discharge 03/07/14: Hemoglobin 10.2, platelet 158 and creatinine 0.87. Has subsequently developed acute renal failure, severe anemia and thrombocytopenia. Oncology and nephrology consulted.   Assessment/Plan:  1. New right middle lobe mass, focal pneumonia (concern for septic emboli) versus malignancy. On IV vancomycin and Zosyn since 9/15. We'll need followup imaging to ensure resolution. Continue bronchodilators and oxygen as needed. Mild hemoptysis likely from this in context of severe thrombocytopenia. Sputum culture shows greater than negative rods. Blood cultures x2: Negative to date 2. Severe Sepsis, present on admission: secondary to #1. Mx as above. Urine Cx, CDiff PCR negative. 3. Acute renal failure, oliguric: DD-sepsis, hemodynamics/ATN from intermittent hypotension, contrast and vancomycin. Foley in place-strict I&O, check urine sodium and creatinine, gently hydrate with IV fluids and monitor creatinine. Vancomycin dose adjusted  to renal function by pharmacy. Nephrology consulted. No history of NSAID/ACE/ARB use. 4. Severe anemia: Likely related to sepsis/anemia of critical illness. No overt bleeding. Status post PRBC 9/17 and hemoglobin improved from 6.3 > 8.6. Transfuse as needed to keep hemoglobin greater than 7 g per DL. 5. Severe thrombocytopenia: Likely secondary to critical illness, severe sepsis, medications (Ancef). Hematology consulted and have reviewed peripheral smear which did not show clear-cut schistocytes. Hematology feels that HIT less likely and no evidence to suggest DIC, TTP or HUS. Recommend continued supportive care, change Ancef which may be contributing to thrombocytopenia, transfuse platelets to keep counts greater than 10 K. or for worsening bleeding. 6. Abnormal LFT's: AST and ALT were near normal 9/1. AST 1466 and ALT 120 on 9/17. Hepatitic picture.? Etiology -? Shock. Check acute hepatitis panel and abdominal ultrasound which will look at both the liver and kidneys. Follow LFTs closely. Improving. 7. History of chronic systolic CHF/non-ischemic cardiomyopathy with last EF 15% and global LV hypokinesis: A repeat echo 9/17 showed LVEF 15%, diffuse LV hypokinesis, grade 2 diastolic dysfunction moderate to severe TR, severe pulmonary hypertension and no mention about vegetations. TEE likely not possible due to his esophageal cancer. Some concern for SBE. Currently euvolemic or even on the dry side. 8. Recent MSSA bacteremia: Port-A-Cath removed. Was discharged on Ancef via PICC line but currently changed to vancomycin and Zosyn. Concern for SBE. TTE without vegetations. Unable to do TEE do to esophageal cancer. Request ID opinion. 9. Anion gap metabolic acidosis/lactic acidosis with anion gap: ? Secondary to sepsis. Anion gap normalized.  10. Esophageal cancer: Oncology input appreciated. Outpatient followup. 11. Nutrition /severe protein calorie malnutrition: PEG tube feeding. 12. Type II DM with  hypoglycemia: DC NPH insulin and continue SSI alone. Tube feeding and monitor closely. 13. Adult Failure to Thrive:  Code Status:  Full Family Communication: Discussed with patients cousin (pt consented) Mr [name] on 9/18- updated care and answered questions. Disposition Plan: Remains in step down unit.   Consultants:  Critical care medicine-signed off 9/17  Hematology/oncology  Nephrology  Procedures:  Right upper extremity PICC-placed on previous admission  Foley catheter  Antibiotics:  IV Zosyn 9/15 >  IV vancomycin 9/15 >   Subjective:  cough with intermittent mild blood in sputum. Denies dyspnea. Denies pain.  Objective: Filed Vitals:   03/18/14 0800 03/18/14 1035 03/18/14 1100 03/18/14 1200  BP:  96/50 123/65 125/75  Pulse:  93 98   Temp: 98.1 F (36.7 C) 97.6 F (36.4 C) 96.7 F (35.9 C) 97.1 F (36.2 C)  TempSrc: Oral Oral Axillary Axillary  Resp:   25   Height:      Weight:      SpO2:   97% 95%    Intake/Output Summary (Last 24 hours) at 03/18/14 1247 Last data filed at 03/18/14 1212  Gross per 24 hour  Intake 1606.75 ml  Output    416 ml  Net 1190.75 ml   Filed Weights   03/24/2014 1200 03/17/14 0400 03/18/14 0500  Weight: 51.9 kg (114 lb 6.7 oz) 53.524 kg (118 lb) 56.246 kg (124 lb)     Exam:  General exam:  pleasant middle-aged moderately built, cachectic chronically ill-looking male lying comfortably supine in bed Respiratory system:  diminished breath sounds bilaterally, especially in the bases with scattered few basal crackles. No wheezing or rhonchi . No increased work of breathing. Cardiovascular system: S1 & S2 heard, RRR. No JVD, murmurs, gallops, clicks or pedal edema. Telemetry: Sinus rhythm with frequent PVCs  Gastrointestinal system: Abdomen is nondistended, soft and nontender. Normal bowel sounds heard. PEG tube site intact.  Central nervous system: Alert and oriented. No focal neurological deficits. Extremities:  Symmetric 5 x 5 power. Right upper extremity PICC site without acute findings  Skin: Dry and scaly.    Data Reviewed: Basic Metabolic Panel:  Recent Labs Lab 03/09/2014 0324 03/17/14 0419 03/18/14 0400  NA 135* 139 139  K 4.6 3.9 3.7  CL 94* 100 100  CO2 15* 25 26  GLUCOSE 94 141* 78  BUN 45* 56* 62*  CREATININE 1.34 1.97* 2.44*  CALCIUM 8.2* 8.0* 8.4  MG  --   --  2.4  PHOS  --   --  3.0   Liver Function Tests:  Recent Labs Lab 03/03/2014 0324 03/17/14 0419  AST 842* 1466*  ALT 101* 120*  ALKPHOS 194* 153*  BILITOT 1.2 0.7  PROT 6.5 5.8*  ALBUMIN 2.7* 2.3*   No results found for this basename: LIPASE, AMYLASE,  in the last 168 hours No results found for this basename: AMMONIA,  in the last 168 hours CBC:  Recent Labs Lab 03/15/2014 0324 03/17/2014 0712 03/17/14 0419 03/18/14 0400  WBC 7.6 7.1 5.2 4.1  NEUTROABS 6.4 6.2  --   --   HGB 7.5* 7.1* 6.3* 8.6*  HCT 22.6* 21.4* 18.8* 25.2*  MCV 91.1 92.2 91.3 88.4  PLT 20* 19* 9* 5*   Cardiac Enzymes: No results found for this basename: CKTOTAL, CKMB, CKMBINDEX, TROPONINI,  in the last 168 hours BNP (last 3 results)  Recent Labs  02/28/14 1636 03/26/2014 0324  PROBNP 29729.0* 29933.0*   CBG:  Recent Labs Lab 03/17/14 1605 03/17/14 2006 03/18/14 0313 03/18/14 0401 03/18/14 0911  GLUCAP 138* 102* 54* 91 102*    Recent Results (from the  past 240 hour(s))  CULTURE, BLOOD (ROUTINE X 2)     Status: None   Collection Time    03/10/2014  3:24 AM      Result Value Ref Range Status   Specimen Description BLOOD RIGHT UPPER ARM   Final   Special Requests BOTTLES DRAWN AEROBIC AND ANAEROBIC 5CC   Final   Culture  Setup Time     Final   Value: 03/07/2014 08:27     Performed at [facility]   Culture     Final   Value:        BLOOD CULTURE RECEIVED NO GROWTH TO DATE CULTURE WILL BE HELD FOR 5 DAYS BEFORE ISSUING A FINAL NEGATIVE REPORT     Performed at [facility]   Report Status PENDING    Incomplete  CULTURE, BLOOD (ROUTINE X 2)     Status: None   Collection Time    03/04/2014  5:15 AM      Result Value Ref Range Status   Specimen Description BLOOD LEFT FOREARM   Final   Special Requests BOTTLES DRAWN AEROBIC ONLY 2ML   Final   Culture  Setup Time     Final   Value: 03/18/2014 08:29     Performed at [facility]   Culture     Final   Value:        BLOOD CULTURE RECEIVED NO GROWTH TO DATE CULTURE WILL BE HELD FOR 5 DAYS BEFORE ISSUING A FINAL NEGATIVE REPORT     Note: Culture results may be compromised due to an inadequate volume of blood received in culture bottles.     Performed at [facility]   Report Status PENDING   Incomplete  URINE CULTURE     Status: None   Collection Time    03/12/2014  6:29 AM      Result Value Ref Range Status   Specimen Description URINE, CATHETERIZED   Final   Special Requests NONE   Final   Culture  Setup Time     Final   Value: 03/08/2014 11:58     Performed at [facility]     Final   Value: NO GROWTH     Performed at [facility]   Culture     Final   Value: NO GROWTH     Performed at [facility]   Report Status 03/17/2014 FINAL   Final  CLOSTRIDIUM DIFFICILE BY PCR     Status: None   Collection Time    03/23/2014  8:47 AM      Result Value Ref Range Status   C difficile by pcr NEGATIVE  NEGATIVE Final   Comment: Performed at [place], EXPECTORATED SPUTUM-ASSESSMENT     Status: None   Collection Time    03/11/2014  8:24 PM      Result Value Ref Range Status   Specimen Description SPUTUM   Final   Special Requests NONE   Final   Sputum evaluation     Final   Value: MICROSCOPIC FINDINGS SUGGEST THAT THIS SPECIMEN IS NOT REPRESENTATIVE OF LOWER RESPIRATORY SECRETIONS. PLEASE RECOLLECT.     CALLED TO YOUNG,S/2W @2100  ON 03/22/2014 BY KARCZEWSKI,S.   Report Status 03/09/2014 FINAL   Final  CULTURE, EXPECTORATED SPUTUM-ASSESSMENT     Status: None   Collection  Time    03/17/14 11:16 AM      Result Value Ref Range Status  Specimen Description SPUTUM   Final   Special Requests Immunocompromised   Final   Sputum evaluation     Final   Value: THIS SPECIMEN IS ACCEPTABLE. RESPIRATORY CULTURE REPORT TO FOLLOW.   Report Status 03/17/2014 FINAL   Final  CULTURE, RESPIRATORY (NON-EXPECTORATED)     Status: None   Collection Time    03/17/14 11:16 AM      Result Value Ref Range Status   Specimen Description SPUTUM   Final   Special Requests NONE   Final   Gram Stain     Final   Value: FEW WBC PRESENT,BOTH PMN AND MONONUCLEAR     FEW SQUAMOUS EPITHELIAL CELLS PRESENT     NO ORGANISMS SEEN     Performed at [facility]   Culture     Final   Value: RARE GRAM NEGATIVE RODS     Performed at [facility]   Report Status PENDING   Incomplete         Studies: Dg Chest Port 1 View  03/18/2014   CLINICAL DATA:  Pneumonia  EXAM: PORTABLE CHEST - 1 VIEW  COMPARISON:  03/11/2014  FINDINGS: Cardiac shadow is mildly enlarged. A right-sided PICC line is again noted with the catheter tip at cavoatrial junction. This is stable from the prior exam. Right pleural effusion as well as focal infiltrate is again identified similar to that seen on recent exam. Mild central vascular prominence is noted. No bony abnormality is seen.  IMPRESSION: Stable changes in the right lung base.  Mild vascular congestion.   Electronically Signed   By: Inez Catalina M.D.   On: 03/18/2014 07:52        Scheduled Meds: . sodium chloride   Intravenous Once  . antiseptic oral rinse  7 mL Mouth Rinse q12n4p  . chlorhexidine  15 mL Mouth Rinse BID  . folic acid  1 mg Per Tube Daily  . insulin NPH Human  10 Units Subcutaneous Q2000  . multivitamin with minerals  1 tablet Per Tube Daily  . nicotine  7 mg Transdermal Daily  . pantoprazole (PROTONIX) IV  40 mg Intravenous Q24H  . piperacillin-tazobactam (ZOSYN)  IV  3.375 g Intravenous 3 times per day  . sodium chloride   10-40 mL Intracatheter Q12H  . sodium chloride  3 mL Intravenous Q12H  . sterile water for irrigation  360 mL Irrigation 3 times per day  . sucralfate  1 g Per Tube TID  . terazosin  1 mg Per Tube QHS  . thiamine  100 mg Per Tube Daily  . vancomycin  500 mg Intravenous Q24H   Continuous Infusions: . sodium chloride Stopped (03/18/14 1040)  . feeding supplement (OSMOLITE 1.5 CAL) 1,000 mL (03/18/14 1244)    Principal Problem:   Severe sepsis    Time spent: 65 minutes.    Vernell Leep, MD, FACP, FHM. [facility] Pager [phone]  If 7PM-7AM, please contact night-coverage [email or web address] Password [email or web address] 03/18/2014, 12:47 PM    LOS: 2 days

## 2014-03-18 NOTE — Progress Notes (Signed)
Inpatient Diabetes Program Recommendations  AACE/ADA: New Consensus Statement on Inpatient Glycemic Control (2013)  Target Ranges:  Prepandial:   less than 140 mg/dL      Peak postprandial:   less than 180 mg/dL (1-2 hours)      Critically ill patients:  140 - 180 mg/dL   Results for Michael Keith, Michael Keith (MRN [id number]) as of 03/18/2014 10:30  Ref. Range 03/17/2014 16:05 03/17/2014 20:06 03/18/2014 03:13 03/18/2014 04:01 03/18/2014 09:11  Glucose-Capillary Latest Range: 70-99 mg/dL 138 (H) 102 (H) 54 (L) 91 102 (H)    Inpatient Diabetes Program Recommendations Insulin - Basal: Decrease NPH to 8 units QD Correction (SSI): Add Novolog sensitive Q4H.  Note: Will continue to follow. Thank you. Lorenda Peck, RD, LDN, CDE Inpatient Diabetes Coordinator [phone]

## 2014-03-18 NOTE — Progress Notes (Signed)
CRITICAL VALUE ALERT  Critical value received:  cbg 46  Date of notification:  03-18-14   Time of notification:  12:30   Critical value read back: yes  Nurse who received alert:  Shelton Silvas, RN   MD notified (1st page):  Dr. Algis Liming   Time of first page:  12:50 p.m. (after 1/2 amp of D50 per protocol)   MD notified (2nd page):  Time of second page:  Responding MD:  [place]  Time MD responded:   12:55 p.m.

## 2014-03-18 NOTE — Progress Notes (Signed)
NUTRITION FOLLOW UP  Intervention:   - Change TF via PEG to Osmolite 1.5 start at 58m/hr increase by 159mevery 4 hours to goal of 5064mr. Goal rate will provide 1800 calories, 75g protein, and 914m69mee water and meet 100% of estimated nutritional needs - Continue adult enteral protocol - RD to continue to monitor   Nutrition Dx:   Inadequate oral intake related to inability to eat as evidenced by NPO - ongoing    Goal:   TF to meet >90% of estimated nutritional needs - not met   Monitor:   Weights, labs, TF tolerance  Assessment:   Pt with history of CHF, esophageal cancer s/p PEG placement[facility] and COPD exacerbation, d/c 1 week ago. Admitted for shortness of breath, found to have sepsis.   9/16: - Pt's TF regimen was was Osmolite 1.5 at 115/hr x 10 hours via PEG PTA (8pm-6am)  - This provided 1725 calories, 72g protein, and 876ml57me water (meets 100% of estimated nutritional needs)  - Pt with visible severe wasting in upper arms, hands, and temporal region with moderate wasting in clavicles  - Pt asleep on non-rebreather, alone in room  - Weight up 10 pounds in the past month  - Obtained verbal order from CCM MD to resume home TF and for RD to manage TF  9/18: - Per RN, pt having problems with low blood sugar during the day, will switch TF to continuous - TF not run last night due to TF orders not showing up in RN worklist   Height: Ht Readings from Last 1 Encounters:  03/19/2014 _0  (1.676 m)    Weight Status:   Wt Readings from Last 1 Encounters:  03/18/14 124 lb (56.246 kg)  Admit wt         113 lb (51.2 kg) Net I/Os: +3.9L  Re-estimated needs:  Kcal: 1700-1900  Protein: 70-90g  Fluid: 1.7-1.9L/day   Skin: intact   Diet Order: NPO   Intake/Output Summary (Last 24 hours) at 03/18/14 1302 Last data filed at 03/18/14 1212  Gross per 24 hour  Intake 1606.75 ml  Output    416 ml  Net 1190.75  ml    Last BM: 9/17   Labs:   Recent Labs Lab 03/20/2014 0324 03/17/14 0419 03/18/14 0400  NA 135* 139 139  K 4.6 3.9 3.7  CL 94* 100 100  CO2 15* 25 26  BUN 45* 56* 62*  CREATININE 1.34 1.97* 2.44*  CALCIUM 8.2* 8.0* 8.4  MG  --   --  2.4  PHOS  --   --  3.0  GLUCOSE 94 141* 78    CBG (last 3)   Recent Labs  03/18/14 0911 03/18/14 1224 03/18/14 1249  GLUCAP 102* 46* 112*    Scheduled Meds: . sodium chloride   Intravenous Once  . antiseptic oral rinse  7 mL Mouth Rinse q12n4p  . chlorhexidine  15 mL Mouth Rinse BID  . folic acid  1 mg Per Tube Daily  . insulin NPH Human  10 Units Subcutaneous Q2000  . multivitamin with minerals  1 tablet Per Tube Daily  . nicotine  7 mg Transdermal Daily  . pantoprazole (PROTONIX) IV  40 mg Intravenous Q24H  . piperacillin-tazobactam (ZOSYN)  IV  3.375 g Intravenous 3 times per day  . sodium chloride  10-40 mL Intracatheter Q12H  . sodium chloride  3 mL Intravenous Q12H  . sterile  water for irrigation  360 mL Irrigation 3 times per day  . sucralfate  1 g Per Tube TID  . terazosin  1 mg Per Tube QHS  . thiamine  100 mg Per Tube Daily  . vancomycin  500 mg Intravenous Q24H    Continuous Infusions: . sodium chloride Stopped (03/18/14 1040)  . feeding supplement (OSMOLITE 1.5 CAL) 1,000 mL (03/18/14 1244)     Carlis Stable MS, RD, LDN [phone] Pager [phone] Weekend/After Hours Pager

## 2014-03-18 NOTE — Progress Notes (Signed)
To provide support and encouragement, visited patient in [facility] ICU.  He was minimally interactive but thanked me for my visit.  [name], RN, BSN, [place] [facility] [place] [phone]

## 2014-03-18 NOTE — Progress Notes (Signed)
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 He S/P chemo/XRT completed in 12/2013. He was hospitalized recently MSSA bacteriemia with removal of port and placement of PICC. Discharged on cefazolin IV with planned stop date 03/31/14. He presents to [facility] ED 9/16 with acute SOB, negative PE protocol CT of chest but with new spiculaed mass vs possible pneumonia. She was found to be predominantly septic and was treated with broad-spectrum antibiotics.  Upon discharge on 03/07/2014, his Hgb was 10.2, platelet was 158. On 9/16 upon readmit, his platelets were  20 and hgb 7.5  Now his platelets dropped to 5 and hgb 8.6.  Clinically he has improved but did develop episodic hemoptysis but no other bleeding.  Objective:  Vital signs in last 24 hours: Temp:  [96.7 F (35.9 C)-98.5 F (36.9 C)] 97.1 F (36.2 C) (09/18 1200) Pulse Rate:  [41-98] 98 (09/18 1100) Resp:  [19-37] 25 (09/18 1100) BP: (92-141)/(47-94) 125/75 mmHg (09/18 1200) SpO2:  [93 %-100 %] 95 % (09/18 1200) Weight:  [124 lb (56.246 kg)] 124 lb (56.246 kg) (09/18 0500) Weight change: 9 lb 9.3 oz (4.346 kg) Last BM Date: 03/17/14  Intake/Output from previous day: 09/17 0701 - 09/18 0700 In: 1720.5 [I.V.:263; Blood:637.5; IV Piggyback:100] Out: 291 [Urine:290; Stool:1]  Mouth: mucous membranes moist, pharynx normal without lesions Resp: clear to auscultation bilaterally Cardio: regular rate and rhythm, S1, S2 normal, no murmur, click, rub or gallop GI: soft, non-tender; bowel sounds normal; no masses,  no organomegaly Extremities: extremities normal, atraumatic, no cyanosis or edema  PICC-without erythema  Lab Results:  Recent Labs  03/17/14 0419 03/18/14 0400  WBC 5.2 4.1  HGB 6.3* 8.6*  HCT 18.8* 25.2*  PLT 9* 5*    BMET  Recent Labs  03/17/14 0419 03/18/14 0400  NA 139 139  K 3.9 3.7  CL 100 100  CO2 25 26  GLUCOSE 141* 78  BUN 56* 62*  CREATININE  1.97* 2.44*  CALCIUM 8.0* 8.4    Studies/Results: Dg Chest Port 1 View  03/18/2014   CLINICAL DATA:  Pneumonia  EXAM: PORTABLE CHEST - 1 VIEW  COMPARISON:  03/20/2014  FINDINGS: Cardiac shadow is mildly enlarged. A right-sided PICC line is again noted with the catheter tip at cavoatrial junction. This is stable from the prior exam. Right pleural effusion as well as focal infiltrate is again identified similar to that seen on recent exam. Mild central vascular prominence is noted. No bony abnormality is seen.  IMPRESSION: Stable changes in the right lung base.  Mild vascular congestion.   Electronically Signed   By: Inez Catalina M.D.   On: 03/18/2014 07:52    EXAM:  CT ANGIOGRAPHY CHEST WITH CONTRAST  TECHNIQUE:  Multidetector CT imaging of the chest was performed using the  standard protocol during bolus administration of intravenous  contrast. Multiplanar CT image reconstructions and MIPs were  obtained to evaluate the vascular anatomy.  CONTRAST: 85mL OMNIPAQUE IOHEXOL 350 MG/ML SOLN  COMPARISON: PET/CT performed 12/21/2013  FINDINGS:  There is no evidence of pulmonary embolus.  There appears to be a spiculated 2.8 cm pleural-based mass within  the right middle lobe, new from the prior study. There is mild  associated architectural distortion. This may simply reflect  scarring, but malignancy cannot be excluded. Small bilateral pleural  effusions are seen, with associated atelectasis.  Emphysematous change is noted bilaterally, most prominent at the  upper lung lobes. No pneumothorax  is seen.  Biatrial enlargement is noted. There is suggestion of an enlarged  subcarinal node, measuring 1.6 cm in short axis. No additional  mediastinal lymphadenopathy is seen. No pericardial effusion is  identified. The great vessels are grossly unremarkable in  appearance. A right PICC is noted ending about the mid to distal  SVC.  The previously noted proximal esophageal mass is no longer well   seen, though this is nonspecific as the esophagus is decompressed at  this level. The thyroid gland is grossly unremarkable in appearance.  No axillary lymphadenopathy is seen.  There is a new nonspecific 1.9 cm subcutaneous soft tissue nodule at  the upper inner quadrant of the right breast. Would correlate for  associated skin findings.  There is mild reflux of contrast into the hepatic veins and IVC. The  visualized portions of the liver and spleen are grossly  unremarkable. Trace ascites is noted within the upper abdomen.  No acute osseous abnormalities are seen.  Review of the MIP images confirms the above findings.  IMPRESSION:  1. No evidence of pulmonary embolus.  2. Apparent spiculated 2.8 cm pleural-based mass within the right  middle lung lobe, new from the prior study. Mild associated  architectural distortion noted. This may simply reflect scarring,  though malignancy cannot be excluded. Follow-up PET/CT would be  helpful for further evaluation.  3. Small bilateral pleural effusions with associated atelectasis.  4. Emphysematous change bilaterally, most prominent at the upper  lung lobes.  5. Biatrial enlargement noted.  6. Suggestion of enlarged subcarinal node measuring 1.6 cm in short  axis, of uncertain significance.  7. Previously noted proximal esophageal mass is no longer well seen,  though this is nonspecific as the esophagus is decompressed at this  level. This could also be further assessed on PET/CT.  8. New nonspecific 1.9 cm subcutaneous soft tissue nodule at the  upper inner quadrant of the right breast. Would correlate for  associated skin findings.  9. Trace ascites within the upper abdomen.      Medications: I have reviewed the patient's current medications.  Assessment/Plan:  63 year old on with the following issues:  1. 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  At his last chemotherapy given in July of 2015. His imaging studies do not suggest any local recurrence of this disease.  2. Anemia and thrombocytopenia: His peripheral smear was profoundly reviewed today and did not show clear-cut schistocytes. There are very few of several fragments consistent with sepsis. Little polychromasia was noted.  The differential diagnosis of these findings likely represents acute illness and severe sepsis, medications (specifically Ancef) and less likely HIT. I see no evidence to suggest DIC or TTP or HUS. His LDH is high but this is likely related to his sepsis and acute illness. I see no clear-cut evidence of microangiopathy at this point.  My recommendation at this point is continue supportive care to treat his underlying condition and potentially change Ancef if possible it might be contributing to his thrombocytopenia. I agree with platelet transfusions to keep his platelet count above 44,034 and certainly higher if he develops more hemoptysis.  3. Right middle lobe mass measuring 2.8 cm: The differential diagnosis would include resolving pneumonia versus malignancy. This will certainly needs to be evaluated but I do not think is contributing to his illness at this point and certainly could be addressed as an outpatient correlate on and his inpatient course once  other issues stabilize.  I will ask my partners to check on Mr. Fentress over the weekend and next week as needed. I will be out of the office till 03/29/2014.      LOS: 2 days   [facility] 03/18/2014, 12:35 PM

## 2014-03-18 NOTE — Clinical Documentation Improvement (Signed)
Presents with Sepsis, Pneumonia, Acute on Chronic Systolic and Diastolic CHF. Patient with abnormal lab values.   Creatinine's have increased from 1.34 on admission to 2.44  Patient was hypotensive: 100/36, 84/47, 99/44, 88/67  Please provide a diagnosis associated with the above clinical data and document findings in next progress note and discharge summary.  Acute Renal Failure/Acute Kidney Injury Acute Renal Failure with ATN Chronic Renal Failure Other Condition  Thank You, Zoila Shutter ,RN Clinical Documentation Specialist:  [facility] [employer]

## 2014-03-18 NOTE — Progress Notes (Signed)
CRITICAL VALUE ALERT  Critical value received:  plts 5  Date of notification:  9/18  Time of notification:  0430  Critical value read back:Yes.    Nurse who received alert:  Darrin Nipper, RN  MD notified (1st page):  Elink  Time of first page:  [phone]

## 2014-03-18 NOTE — Consult Note (Signed)
Reason for Consult:AKI Referring Physician: Algis Liming, MD  Michael Keith is an 63 y.o. male.  HPI: 63yo M with an extensive PMH most notable for Squamous cell carcinoma of the esophagus s/p  XRT concomitantly with Carboplatin and Taxol (completed chemo in July 2015 and completed XRT 03/08/14) as well as PEG placement.  He also has chronic systolic CHF with EF of only 15% who was admitted 8/31-03/08/14 with MSSA bacteremia from his Porta-cath and was discharged on Ancef until 03/31/14.  He presented to [facility] ED on 03/10/2014 with worsening SOB and productive cough.  He was found to have airspace disease in the RLL field and was admitted with HCAP and SIRS.  He was also noted to have a new spiculated lesion on his RML and no evidence of PE with CT angio performed on 03/30/2014.   We were asked to see the patient due to the development of AKI over the last 2 days following his CT angio and admission.  The trend in Scr is seen below:   Trend in Creatinine:  Creatinine, Ser  Date/Time Value Ref Range Status  03/18/2014  4:00 AM 2.44* 0.50 - 1.35 mg/dL Final  03/17/2014  4:19 AM 1.97* 0.50 - 1.35 mg/dL Final  03/15/2014  3:24 AM 1.34  0.50 - 1.35 mg/dL Final  03/08/2014 12:30 PM 0.87  0.50 - 1.35 mg/dL Final  03/07/2014  5:55 AM 0.95  0.50 - 1.35 mg/dL Final  03/06/2014  5:19 AM 1.08  0.50 - 1.35 mg/dL Final  03/05/2014  3:35 AM 1.14  0.50 - 1.35 mg/dL Final  03/04/2014  4:18 AM 1.28  0.50 - 1.35 mg/dL Final  03/03/2014  5:55 AM 1.50* 0.50 - 1.35 mg/dL Final  03/02/2014  5:45 AM 1.41* 0.50 - 1.35 mg/dL Final  03/01/2014  6:30 AM 1.17  0.50 - 1.35 mg/dL Final  02/28/2014  7:18 PM 1.17  0.50 - 1.35 mg/dL Final  02/28/2014  4:36 PM 1.15  0.50 - 1.35 mg/dL Final  02/27/2014  4:20 PM 1.01  0.50 - 1.35 mg/dL Final  02/22/2014 10:16 AM 1.0  0.7 - 1.3 mg/dL Final  02/07/2014  4:40 AM 1.03  0.50 - 1.35 mg/dL Final  02/05/2014  5:05 AM 0.92  0.50 - 1.35 mg/dL Final  02/04/2014  6:07 AM 0.98  0.50 - 1.35 mg/dL Final  02/03/2014  4:05 AM 1.19  0.50  - 1.35 mg/dL Final  02/02/2014  8:50 PM 1.18  0.50 - 1.35 mg/dL Final  02/02/2014  4:00 AM 1.18  0.50 - 1.35 mg/dL Final  02/01/2014  4:12 AM 1.49* 0.50 - 1.35 mg/dL Final  01/31/2014  6:25 PM 1.65* 0.50 - 1.35 mg/dL Final  01/25/2014 12:20 PM 1.8* 0.7 - 1.3 mg/dL Final  01/18/2014  8:59 AM 2.1* 0.7 - 1.3 mg/dL Final  01/12/2014  2:24 PM 2.6* 0.7 - 1.3 mg/dL Final  12/09/2013  1:33 PM 2.2* 0.7 - 1.3 mg/dL Final  09/20/2013  2:41 PM 1.6* 0.4 - 1.5 mg/dL Final  05/02/2010  5:05 AM 1.15  0.4 - 1.5 mg/dL Final  05/01/2010  4:55 AM 0.93  0.4 - 1.5 mg/dL Final  04/30/2010  4:00 AM 0.90  0.4 - 1.5 mg/dL Final  04/29/2010  5:30 AM 1.00  0.4 - 1.5 mg/dL Final  04/28/2010  5:00 AM 1.00  0.4 - 1.5 mg/dL Final  04/27/2010  3:50 AM 1.11  0.4 - 1.5 mg/dL Final  04/26/2010  2:15 PM 0.84  0.4 - 1.5 mg/dL Final  04/26/2010  6:42 AM 0.93  0.4 - 1.5 mg/dL Final  04/25/2010 12:19 PM 0.98  0.4 - 1.5 mg/dL Final  04/02/2010  6:47 PM 1.07  0.4 - 1.5 mg/dL Final  10/26/2009  8:19 PM 0.78  0.4 - 1.5 mg/dL Final  10/26/2009  6:31 AM 0.75  0.4 - 1.5 mg/dL Final  10/25/2009  3:30 AM 0.73  0.4 - 1.5 mg/dL Final  10/24/2009  7:08 PM 0.70  0.4 - 1.5 mg/dL Final  10/04/2009  6:00 AM 0.60  0.4 - 1.5 mg/dL Final  10/03/2009  4:45 AM 0.64  0.4 - 1.5 mg/dL Final  10/02/2009  5:19 AM 0.55  0.4 - 1.5 mg/dL Final  10/01/2009  6:30 AM 0.52  0.4 - 1.5 mg/dL Final  09/30/2009  4:13 PM 0.58  0.4 - 1.5 mg/dL Final  09/29/2009 11:54 PM 0.6  0.4 - 1.5 mg/dL Final    PMH:   Past Medical History  Diagnosis Date  . Chronic pancreatitis     CT scan shows improving pancreatitis  . Nonischemic cardiomyopathy 09/2009    Catheterization, April, 2011, questionable occlusion of the most apical portion of the LAD, LV dysfunction out of proportion to coronary disease  . Chronic systolic CHF (congestive heart failure)     April, 2011 mild troponin elevation at that time  . Mitral regurgitation     mild, echo April 2011  . Tobacco abuse   . Alcohol use     heavy  until Jan 2011, patient was counseled concerning alcohol in April 2011  . COPD (chronic obstructive pulmonary disease)   . Fluid overload 03/2010    October, 2011  . Cough Jan 2012    may be from enalapril, January, 2012  . Ejection fraction < 50%     EF 35-40%, echo, April, 2011  . CAD (coronary artery disease)     Catheterization, April, 2011, questionable occlusion of the most apical portion of the LAD, LV dysfunction out of proportion to coronary disease  . Squamous cell esophageal cancer   . Severe protein-calorie malnutrition 01/31/2014    PSH:   Past Surgical History  Procedure Laterality Date  . Cardiac catheterization  April 2011    questionable occlusion of the most apical portion of the LAD / LV dysfunction out of proportion to coronary disease  . Wrist surgey      left wrist  . Laparoscopic gastrostomy N/A 02/02/2014    Procedure: LAPAROSCOPIC GASTROSTOMY TUBE PLACEMENT;  Surgeon: Pedro Earls, MD;  Location: [facility];  Service: General;  Laterality: N/A;    Allergies: No Known Allergies  Medications:   Prior to Admission medications   Medication Sig Start Date End Date Taking? Authorizing Provider  aspirin EC 81 MG tablet 81 mg by PEG Tube route every morning.    Yes Historical Provider, MD  ceFAZolin (ANCEF) 2-3 GM-% SOLR Inject 50 mLs (2 g total) into the vein every 8 (eight) hours. With last day on 03/31/14 03/08/14  Yes Orson Eva, MD  folic acid (FOLVITE) 1 MG tablet Place 1 tablet (1 mg total) into feeding tube daily. 03/08/14  Yes Orson Eva, MD  insulin NPH Human (NOVOLIN N) 100 UNIT/ML injection Inject 0.1 mLs (10 Units total) into the skin daily at 8 pm. 03/08/14  Yes Orson Eva, MD  Multiple Vitamin (MULTIVITAMIN WITH MINERALS) TABS tablet 1 tablet by PEG Tube route daily.    Yes Historical Provider, MD  nicotine (NICODERM CQ - DOSED IN MG/24 HOURS) 21 mg/24hr patch Place  21 mg onto the skin daily.   Yes Historical Provider, MD  Nutritional Supplements (FEEDING  SUPPLEMENT, OSMOLITE 1.5 CAL,) LIQD 1,000 mLs by PEG Tube route continuous. 115 ml/hr  For 10 hrs  (8pm-6am)   Yes Historical Provider, MD  omeprazole-sodium bicarbonate (ZEGERID) 40-1100 MG per capsule Take 1 capsule by mouth See admin instructions. Via Peg-tube once daily   Yes Historical Provider, MD  sucralfate (CARAFATE) 1 GM/10ML suspension 1 g by PEG Tube route 3 (three) times daily. FOR GASTRIC PROTECTION   Yes Historical Provider, MD  terazosin (HYTRIN) 1 MG capsule 1 mg by PEG Tube route at bedtime.    Yes Historical Provider, MD  thiamine (VITAMIN B-1) 100 MG tablet 100 mg by PEG Tube route daily.    Yes Historical Provider, MD  Water For Irrigation, Sterile (STERILE WATER FOR IRRIGATION) Irrigate with 360 mLs as directed every 8 (eight) hours.   Yes Historical Provider, MD  HYDROcodone-acetaminophen (HYCET) 7.5-325 mg/15 ml solution Place 15 mLs into feeding tube every 6 (six) hours as needed (FOR PAIN RELATED TO MALIGNANT NEOPLASM OF ESOPHAGUS). 03/09/14   Blanchie Serve, MD  lidocaine-prilocaine (EMLA) cream Apply 1 application topically as needed (port access.). 01/20/14   Wyatt Portela, MD  metoCLOPramide (REGLAN) 5 MG tablet Take 5 mg by mouth every 8 (eight) hours as needed for nausea.    Historical Provider, MD  nitroGLYCERIN (NITROSTAT) 0.4 MG SL tablet Place 0.4 mg under the tongue every 5 (five) minutes x 3 doses as needed for chest pain.  01/27/14   Carlena Bjornstad, MD  ondansetron (ZOFRAN-ODT) 4 MG disintegrating tablet 4 mg by PEG Tube route every 8 (eight) hours as needed for nausea or vomiting.     Historical Provider, MD  PRESCRIPTION MEDICATION 120 mLs by PEG Tube route 3 (three) times daily. Med plus    Historical Provider, MD    Inpatient medications: . sodium chloride   Intravenous Once  . sodium chloride   Intravenous Once  . antiseptic oral rinse  7 mL Mouth Rinse q12n4p  . chlorhexidine  15 mL Mouth Rinse BID  . feeding supplement (OSMOLITE 1.5 CAL)  1,000 mL Per Tube  Q24H  . folic acid  1 mg Per Tube Daily  . insulin NPH Human  10 Units Subcutaneous Q2000  . multivitamin with minerals  1 tablet Per Tube Daily  . nicotine  7 mg Transdermal Daily  . pantoprazole (PROTONIX) IV  40 mg Intravenous Q24H  . piperacillin-tazobactam (ZOSYN)  IV  3.375 g Intravenous 3 times per day  . sodium chloride  10-40 mL Intracatheter Q12H  . sodium chloride  3 mL Intravenous Q12H  . sterile water for irrigation  360 mL Irrigation 3 times per day  . sucralfate  1 g Per Tube TID  . terazosin  1 mg Per Tube QHS  . thiamine  100 mg Per Tube Daily  . vancomycin  500 mg Intravenous Q24H    Discontinued Meds:   Medications Discontinued During This Encounter  Medication Reason  . LORazepam (ATIVAN) 0.5 MG tablet Patient has not taken in last 30 days  . 0.9 %  sodium chloride infusion   . 0.9 %  sodium chloride infusion   . nicotine (NICODERM CQ - dosed in mg/24 hours) patch 21 mg   . feeding supplement (VITAL HIGH PROTEIN) liquid 1,000 mL   . feeding supplement (OSMOLITE 1.5 CAL) liquid 1,000 mL   . vancomycin (VANCOCIN) 500 mg in sodium chloride 0.9 %  100 mL IVPB   . ipratropium (ATROVENT) nebulizer solution 0.5 mg   . levalbuterol (XOPENEX) nebulizer solution 0.63 mg   . feeding supplement (OSMOLITE 1.5 CAL) liquid 1,000 mL     Social History:  reports that he quit smoking about 8 weeks ago. His smoking use included Cigarettes. He has a 15 pack-year smoking history. He has never used smokeless tobacco. He reports that he does not drink alcohol or use illicit drugs.  Family History:   Family History  Problem Relation Age of Onset  . Diabetes Cousin   . Cancer Mother     Pertinent items are noted in HPI. Weight change: 4.346 kg (9 lb 9.3 oz)  Intake/Output Summary (Last 24 hours) at 03/18/14 1045 Last data filed at 03/18/14 1035  Gross per 24 hour  Intake 1960.5 ml  Output    291 ml  Net 1669.5 ml   BP 96/50  Pulse 93  Temp(Src) 97.6 F (36.4 C) (Oral)   Resp 22  Ht 5\' 6"  (1.676 m)  Wt 56.246 kg (124 lb)  BMI 20.02 kg/m2  SpO2 98% Filed Vitals:   03/18/14 0500 03/18/14 0730 03/18/14 0800 03/18/14 1035  BP: 106/83 123/72  96/50  Pulse: 41 88  93  Temp:   98.1 F (36.7 C) 97.6 F (36.4 C)  TempSrc:   Oral Oral  Resp: 21 22    Height:      Weight: 56.246 kg (124 lb)     SpO2: 99% 98%       General appearance: cooperative, appears older than stated age, fatigued and no distress Head: atraumatic, hyperpigmentation on forehead/radiation changes Eyes: negative findings: lids and lashes normal, conjunctivae and sclerae normal and corneas clear Neck: no adenopathy, no carotid bruit, no JVD, supple, symmetrical, trachea midline and thyroid not enlarged, symmetric, no tenderness/mass/nodules Resp: occassional rhonchi at his bases Cardio: regular rate and rhythm and no rub GI: +BS, soft, NT, PEG in place Extremities: extremities normal, atraumatic, no cyanosis or edema  Labs: Basic Metabolic Panel:  Recent Labs Lab 03/18/2014 0324 03/17/14 0419 03/18/14 0400  NA 135* 139 139  K 4.6 3.9 3.7  CL 94* 100 100  CO2 15* 25 26  GLUCOSE 94 141* 78  BUN 45* 56* 62*  CREATININE 1.34 1.97* 2.44*  ALBUMIN 2.7* 2.3*  --   CALCIUM 8.2* 8.0* 8.4  PHOS  --   --  3.0   Liver Function Tests:  Recent Labs Lab 03/27/2014 0324 03/17/14 0419  AST 842* 1466*  ALT 101* 120*  ALKPHOS 194* 153*  BILITOT 1.2 0.7  PROT 6.5 5.8*  ALBUMIN 2.7* 2.3*   No results found for this basename: LIPASE, AMYLASE,  in the last 168 hours No results found for this basename: AMMONIA,  in the last 168 hours CBC:  Recent Labs Lab 03/14/2014 0324 03/26/2014 0712 03/17/14 0419 03/18/14 0400  WBC 7.6 7.1 5.2 4.1  NEUTROABS 6.4 6.2  --   --   HGB 7.5* 7.1* 6.3* 8.6*  HCT 22.6* 21.4* 18.8* 25.2*  MCV 91.1 92.2 91.3 88.4  PLT 20* 19* 9* 5*   PT/INR: @LABRCNTIP (inr:5) Cardiac Enzymes: )No results found for this basename: CKTOTAL, CKMB, CKMBINDEX, TROPONINI,  in  the last 168 hours CBG:  Recent Labs Lab 03/17/14 1605 03/17/14 2006 03/18/14 0313 03/18/14 0401 03/18/14 0911  GLUCAP 138* 102* 54* 91 102*    Iron Studies: No results found for this basename: IRON, TIBC, TRANSFERRIN, FERRITIN,  in the last 168 hours  Xrays/Other Studies: Dg Chest Port 1 View  03/18/2014   CLINICAL DATA:  Pneumonia  EXAM: PORTABLE CHEST - 1 VIEW  COMPARISON:  03/10/2014  FINDINGS: Cardiac shadow is mildly enlarged. A right-sided PICC line is again noted with the catheter tip at cavoatrial junction. This is stable from the prior exam. Right pleural effusion as well as focal infiltrate is again identified similar to that seen on recent exam. Mild central vascular prominence is noted. No bony abnormality is seen.  IMPRESSION: Stable changes in the right lung base.  Mild vascular congestion.   Electronically Signed   By: Inez Catalina M.D.   On: 03/18/2014 07:52     Assessment/Plan: 1.  AKI- most consistent with contrast-induced nephropathy but also possible ischemic ATN in setting of acute illness and anemia/hypotension. 1. Avoid any more IV contrast 2. Keep SBP >100 3. Renal dose meds 4. Cont to monitor UOP and Scr daily 2. Anemia of chronic disease/malignancy 1. Transfuse per Heme/Onc 3. SIRS- per primary team. On vanc/zosyn, follow vanc trough levels closely 4. Elevated LFT's- likely from shock liver due to #3 5. Systolic CHF- stable 6. HCAP- per primary 7. MSSA bacteremia from porta-cath- was on Ancef now on vanc/zosyn 8. Thrombocytopenia- work up per Heme/Onc (?DIC) 9. Esophageal cancer- s/p chemo and XRT- no local recurrence 10. RML spiculated mass- w/u per Heme/Onc 11. Protein malnutrition   Tajanae Guilbault A 03/18/2014, 10:45 AM

## 2014-03-18 NOTE — Progress Notes (Signed)
Patient ID: Michael Keith, male   DOB: [birth year], 63 y.o.   MRN: [id number]           PROGRESS NOTE  DATE: 03/14/2014         FACILITY:  [facility]  LEVEL OF CARE: SNF (31)  Acute Visit  CHIEF COMPLAINT:  Manage restlessness at night.       HISTORY OF PRESENT ILLNESS: I was requested by the staff to assess the patient regarding above problem(s):  Patient is complaining of restlessness and agitation at night and requests medication to help him calm down.  He cannot identify precipitating or alleviating factors.  There are no other associated signs and symptoms.    PAST MEDICAL HISTORY : Reviewed.  No changes/see problem list  CURRENT MEDICATIONS: Reviewed per MAR/see medication list  REVIEW OF SYSTEMS:  GENERAL: no change in appetite, no fatigue, no weight changes, no fever, chills or weakness RESPIRATORY: no cough, SOB, DOE,, wheezing, hemoptysis CARDIAC: no chest pain, edema or palpitations GI: no abdominal pain, diarrhea, constipation, heart burn, nausea or vomiting  PHYSICAL EXAMINATION  VS: see VS section  GENERAL: no acute distress, normal body habitus NECK: supple, trachea midline, no neck masses, no thyroid tenderness, no thyromegaly RESPIRATORY: breathing is even & unlabored, BS CTAB CARDIAC: RRR, no murmur,no extra heart sounds, no edema GI: abdomen soft, normal BS, no masses, no tenderness, no hepatomegaly, no splenomegaly PSYCHIATRIC: the patient is alert & oriented to person, affect & behavior appropriate  ASSESSMENT/PLAN:  Anxiety.  New problem.  Start lorazepam 0.5 mg q.h.s.     CPT CODE: 77824           Gayani Y Dasanayaka, [place] [phone]

## 2014-03-18 NOTE — Consult Note (Signed)
[name]  Total days of antibiotics 4        Day 4 vanco        Day 4 piptazo               Reason for Consult: sepsis and recent hx of mssa bacteremia    Referring Physician: [email or web address]  Principal Problem:   Severe sepsis Active Problems:   Nonischemic cardiomyopathy   Chronic systolic CHF (congestive heart failure)   Squamous cell esophageal cancer   Severe protein-calorie malnutrition   Type II or unspecified type diabetes mellitus with renal manifestations, not stated as uncontrolled   Acute renal failure   Anemia, unspecified   Thrombocytopenia, unspecified   Acute hepatitis    HPI: Michael Keith is a 63 y.o. male  Squamous cell esophageal cancer S/P chemo/XRT completed in 12/2013 s/p PEG, NICM with EF 15%, who was admitted on 8/31 for MSSA bacteremia and pneumonia. He had his portacath removed, with recommendations treating with cefazolin for 4 weeks total using 03/05/14 as day 1 (day after port removal).He was readmitted for on 9/16 for acute onset of worsening shortness of breath and productive cough. He was found to haveblood pressure was 101/53, HR 49 - 112, RR 22 - 44, T max 97.8 F, oxygen saturation 89% on 2 L nasal cannula oxygen support. With multiple lab abnormalities including new thrombocytopenia of plt of 20k down from 150 at BL. hemoglobin of 7.5, down from 10. New aki with cr 1.34 up from 0.87. AST 842 and ALT 101. LA 10.4. He was switched to vanco and piptazo due to concern for HCAP causing sepsis.  Chest x-ray showed airspace disease in the RLL field . CT angio chest ruled out pulmonary embolism but found a spiculated 2.8 cm pleural-based mass within the right middle lung lobe new from prior study which can reflect scarring or malignancy. Also seen were emphysematous changes bilaterally and new nonspecific 1.9 cm subcutaneous soft tissue nodule at the upper inner quadrant of the right breast. Since admit, his Cr function continues to worsen as  well as plt remaining low at 5K. Lactic acidosis improved. AKI is thought to be due in part to contrast induced nephropathy and possibly ischemic ATN in setting of acute illness/hypotension/anemia. He remains afebrile during this admission and currently day #4 of vancomycin and piptazo.    Past Medical History  Diagnosis Date  . Chronic pancreatitis     CT scan shows improving pancreatitis  . Nonischemic cardiomyopathy 09/2009    Catheterization, April, 2011, questionable occlusion of the most apical portion of the LAD, LV dysfunction out of proportion to coronary disease  . Chronic systolic CHF (congestive heart failure)     April, 2011 mild troponin elevation at that time  . Mitral regurgitation     mild, echo April 2011  . Tobacco abuse   . Alcohol use     heavy until Jan 2011, patient was counseled concerning alcohol in April 2011  . COPD (chronic obstructive pulmonary disease)   . Fluid overload 03/2010    October, 2011  . Cough Jan 2012    may be from enalapril, January, 2012  . Ejection fraction < 50%     EF 35-40%, echo, April, 2011  . CAD (coronary artery disease)     Catheterization, April, 2011, questionable occlusion of the most apical portion of the LAD, LV dysfunction out of proportion to coronary disease  . Squamous cell esophageal cancer   .  Severe protein-calorie malnutrition 01/31/2014    Allergies: No Known Allergies    MEDICATIONS: . antiseptic oral rinse  7 mL Mouth Rinse q12n4p  . chlorhexidine  15 mL Mouth Rinse BID  . folic acid  1 mg Per Tube Daily  . multivitamin with minerals  1 tablet Per Tube Daily  . nicotine  7 mg Transdermal Daily  . pantoprazole (PROTONIX) IV  40 mg Intravenous Q24H  . piperacillin-tazobactam (ZOSYN)  IV  3.375 g Intravenous 3 times per day  . sodium chloride  10-40 mL Intracatheter Q12H  . sodium chloride  3 mL Intravenous Q12H  . sterile water for irrigation  360 mL Irrigation 3 times per day  . sucralfate  1 g Per Tube TID   . terazosin  1 mg Per Tube QHS  . thiamine  100 mg Per Tube Daily  . vancomycin  500 mg Intravenous Q24H    History  Substance Use Topics  . Smoking status: Former Smoker -- 0.50 packs/day for 30 years    Types: Cigarettes    Quit date: 01/19/2014  . Smokeless tobacco: Never Used     Comment: smoking 3 cigarettes per day  . Alcohol Use: No     Comment: heavy untill Jan 2011  - quit smoking 2 months ago, prior 15PYHx.  Family History  Problem Relation Age of Onset  . Diabetes Cousin   . Cancer Mother     Review of Systems  Constitutional: Negative for fever, chills, diaphoresis, activity change, appetite change, fatigue and unexpected weight change.  HENT: Negative for congestion, sore throat, rhinorrhea, sneezing, trouble swallowing and sinus pressure.  Eyes: Negative for photophobia and visual disturbance.  Respiratory: Negative for cough, chest tightness, shortness of breath, wheezing and stridor.  Cardiovascular: Negative for chest pain, palpitations and leg swelling.  Gastrointestinal: Negative for nausea, vomiting, abdominal pain, diarrhea, constipation, blood in stool, abdominal distention and anal bleeding.  Genitourinary: Negative for dysuria, hematuria, flank pain and difficulty urinating.  Musculoskeletal: Negative for myalgias, back pain, joint swelling, arthralgias and gait problem.  Skin: Negative for color change, pallor, rash and wound.  Neurological: Negative for dizziness, tremors, weakness and light-headedness.  Hematological: Negative for adenopathy. Does not bruise/bleed easily.  Psychiatric/Behavioral: Negative for behavioral problems, confusion, sleep disturbance, dysphoric mood, decreased concentration and agitation.     OBJECTIVE: Temp:  [96.7 F (35.9 C)-98.5 F (36.9 C)] 97.4 F (36.3 C) (09/18 1400) Pulse Rate:  [41-98] 97 (09/18 1400) Resp:  [19-37] 25 (09/18 1100) BP: (92-141)/(47-94) 134/81 mmHg (09/18 1400) SpO2:  [93 %-100 %] 95 % (09/18  1200) Weight:  [124 lb (56.246 kg)] 124 lb (56.246 kg) (09/18 0500)   Constitutional: He is oriented to person, place, and time. He appears well-developed and well-nourished. No distress.  HENT:  Mouth/Throat: Oropharynx is clear and moist. No oropharyngeal exudate.  Cardiovascular: Normal rate, regular rhythm and normal heart sounds. Exam reveals no gallop and no friction rub.  No murmur heard.  Pulmonary/Chest: Effort normal and breath sounds normal. No respiratory distress. He has no wheezes.  Abdominal: Soft. Bowel sounds are normal. He exhibits no distension. There is no tenderness. PEG in place non tender and no surrounding erythema Ext: Picc line is c/d/i Lymphadenopathy:  He has no cervical adenopathy.  Neurological: He is alert and oriented to person, place, and time.  Skin: Skin is warm and dry. No rash noted. No erythema.  Psychiatric: He has a normal mood and affect. His behavior is normal.  LABS: Results for orders placed during the hospital encounter of 03/14/2014 (from the past 48 hour(s))  GLUCOSE, CAPILLARY     Status: Abnormal   Collection Time    03/30/2014  7:19 PM      Result Value Ref Range   Glucose-Capillary 131 (*) 70 - 99 mg/dL   Comment 1 Notify RN    CULTURE, EXPECTORATED SPUTUM-ASSESSMENT     Status: None   Collection Time    03/09/2014  8:24 PM      Result Value Ref Range   Specimen Description SPUTUM     Special Requests NONE     Sputum evaluation       Value: MICROSCOPIC FINDINGS SUGGEST THAT THIS SPECIMEN IS NOT REPRESENTATIVE OF LOWER RESPIRATORY SECRETIONS. PLEASE RECOLLECT.     CALLED TO YOUNG,S/2W @2100  ON 03/05/2014 BY KARCZEWSKI,S.   Report Status 03/27/2014 FINAL    GLUCOSE, CAPILLARY     Status: Abnormal   Collection Time    03/17/14 12:27 AM      Result Value Ref Range   Glucose-Capillary 163 (*) 70 - 99 mg/dL  GLUCOSE, CAPILLARY     Status: Abnormal   Collection Time    03/17/14  4:15 AM      Result Value Ref Range   Glucose-Capillary  149 (*) 70 - 99 mg/dL  COMPREHENSIVE METABOLIC PANEL     Status: Abnormal   Collection Time    03/17/14  4:19 AM      Result Value Ref Range   Sodium 139  137 - 147 mEq/L   Potassium 3.9  3.7 - 5.3 mEq/L   Chloride 100  96 - 112 mEq/L   CO2 25  19 - 32 mEq/L   Glucose, Bld 141 (*) 70 - 99 mg/dL   BUN 56 (*) 6 - 23 mg/dL   Creatinine, Ser 1.97 (*) 0.50 - 1.35 mg/dL   Calcium 8.0 (*) 8.4 - 10.5 mg/dL   Total Protein 5.8 (*) 6.0 - 8.3 g/dL   Albumin 2.3 (*) 3.5 - 5.2 g/dL   AST 1466 (*) 0 - 37 U/L   ALT 120 (*) 0 - 53 U/L   Alkaline Phosphatase 153 (*) 39 - 117 U/L   Total Bilirubin 0.7  0.3 - 1.2 mg/dL   GFR calc non Af Amer 34 (*) >90 mL/min   GFR calc Af Amer 40 (*) >90 mL/min   Comment: (NOTE)     The eGFR has been calculated using the CKD EPI equation.     This calculation has not been validated in all clinical situations.     eGFR's persistently <90 mL/min signify possible Chronic Kidney     Disease.   Anion gap 14  5 - 15  CBC     Status: Abnormal   Collection Time    03/17/14  4:19 AM      Result Value Ref Range   WBC 5.2  4.0 - 10.5 K/uL   RBC 2.06 (*) 4.22 - 5.81 MIL/uL   Hemoglobin 6.3 (*) 13.0 - 17.0 g/dL   Comment: REPEATED TO VERIFY     CRITICAL RESULT CALLED TO, READ BACK BY AND VERIFIED WITH:     S. YOUNG RN AT 0505 ON 09.17.15 BY SHUEA   HCT 18.8 (*) 39.0 - 52.0 %   MCV 91.3  78.0 - 100.0 fL   MCH 30.6  26.0 - 34.0 pg   MCHC 33.5  30.0 - 36.0 g/dL   RDW 17.6 (*) 11.5 - 15.5 %  Platelets 9 (*) 150 - 400 K/uL   Comment: SPECIMEN CHECKED FOR CLOTS     REPEATED TO VERIFY     PLATELET COUNT CONFIRMED BY SMEAR     CRITICAL RESULT CALLED TO, READ BACK BY AND VERIFIED WITH:     S. YOUNG RN AT 0505 ON 09.17.15 BY SHUEA     DELTA CHECK NOTED  PROCALCITONIN     Status: None   Collection Time    03/17/14  4:19 AM      Result Value Ref Range   Procalcitonin 3.96     Comment:            Interpretation:     PCT > 2 ng/mL:     Systemic infection (sepsis) is  likely,     unless other causes are known.     (NOTE)             ICU PCT Algorithm               Non ICU PCT Algorithm        ----------------------------     ------------------------------             PCT < 0.25 ng/mL                 PCT < 0.1 ng/mL         Stopping of antibiotics            Stopping of antibiotics           strongly encouraged.               strongly encouraged.        ----------------------------     ------------------------------           PCT level decrease by               PCT < 0.25 ng/mL           >= 80% from peak PCT           OR PCT 0.25 - 0.5 ng/mL          Stopping of antibiotics                                                 encouraged.         Stopping of antibiotics               encouraged.        ----------------------------     ------------------------------           PCT level decrease by              PCT >= 0.25 ng/mL           < 80% from peak PCT            AND PCT >= 0.5 ng/mL            Continuing antibiotics                                                  encouraged.           Continuing antibiotics  encouraged.        ----------------------------     ------------------------------         PCT level increase compared          PCT > 0.5 ng/mL             with peak PCT AND              PCT >= 0.5 ng/mL             Escalation of antibiotics                                              strongly encouraged.          Escalation of antibiotics            strongly encouraged.  PREPARE RBC (CROSSMATCH)     Status: None   Collection Time    03/17/14  6:00 AM      Result Value Ref Range   Order Confirmation ORDER PROCESSED BY BLOOD BANK    GLUCOSE, CAPILLARY     Status: Abnormal   Collection Time    03/17/14  8:13 AM      Result Value Ref Range   Glucose-Capillary 129 (*) 70 - 99 mg/dL   Comment 1 Documented in Chart     Comment 2 Notify RN    LACTIC ACID, PLASMA     Status: Abnormal   Collection Time    03/17/14  9:12 AM       Result Value Ref Range   Lactic Acid, Venous 2.4 (*) 0.5 - 2.2 mmol/L  CULTURE, EXPECTORATED SPUTUM-ASSESSMENT     Status: None   Collection Time    03/17/14 11:16 AM      Result Value Ref Range   Specimen Description SPUTUM     Special Requests Immunocompromised     Sputum evaluation       Value: THIS SPECIMEN IS ACCEPTABLE. RESPIRATORY CULTURE REPORT TO FOLLOW.   Report Status 03/17/2014 FINAL    CULTURE, RESPIRATORY (NON-EXPECTORATED)     Status: None   Collection Time    03/17/14 11:16 AM      Result Value Ref Range   Specimen Description SPUTUM     Special Requests NONE     Gram Stain       Value: FEW WBC PRESENT,BOTH PMN AND MONONUCLEAR     FEW SQUAMOUS EPITHELIAL CELLS PRESENT     NO ORGANISMS SEEN     Performed at [facility]   Culture       Value: RARE GRAM NEGATIVE RODS     Performed at [facility]   Report Status PENDING    GLUCOSE, CAPILLARY     Status: Abnormal   Collection Time    03/17/14 12:15 PM      Result Value Ref Range   Glucose-Capillary 172 (*) 70 - 99 mg/dL  GLUCOSE, CAPILLARY     Status: Abnormal   Collection Time    03/17/14  4:05 PM      Result Value Ref Range   Glucose-Capillary 138 (*) 70 - 99 mg/dL   Comment 1 Documented in Chart     Comment 2 Notify RN    GLUCOSE, CAPILLARY     Status: Abnormal   Collection Time    03/17/14  8:06 PM  Result Value Ref Range   Glucose-Capillary 102 (*) 70 - 99 mg/dL  GLUCOSE, CAPILLARY     Status: Abnormal   Collection Time    03/18/14  3:13 AM      Result Value Ref Range   Glucose-Capillary 54 (*) 70 - 99 mg/dL   Comment 1 Notify RN    PROCALCITONIN     Status: None   Collection Time    03/18/14  4:00 AM      Result Value Ref Range   Procalcitonin 3.83     Comment:            Interpretation:     PCT > 2 ng/mL:     Systemic infection (sepsis) is likely,     unless other causes are known.     (NOTE)             ICU PCT Algorithm               Non ICU PCT Algorithm         ----------------------------     ------------------------------             PCT < 0.25 ng/mL                 PCT < 0.1 ng/mL         Stopping of antibiotics            Stopping of antibiotics           strongly encouraged.               strongly encouraged.        ----------------------------     ------------------------------           PCT level decrease by               PCT < 0.25 ng/mL           >= 80% from peak PCT           OR PCT 0.25 - 0.5 ng/mL          Stopping of antibiotics                                                 encouraged.         Stopping of antibiotics               encouraged.        ----------------------------     ------------------------------           PCT level decrease by              PCT >= 0.25 ng/mL           < 80% from peak PCT            AND PCT >= 0.5 ng/mL            Continuing antibiotics                                                  encouraged.           Continuing antibiotics  encouraged.        ----------------------------     ------------------------------         PCT level increase compared          PCT > 0.5 ng/mL             with peak PCT AND              PCT >= 0.5 ng/mL             Escalation of antibiotics                                              strongly encouraged.          Escalation of antibiotics            strongly encouraged.  CBC     Status: Abnormal   Collection Time    03/18/14  4:00 AM      Result Value Ref Range   WBC 4.1  4.0 - 10.5 K/uL   RBC 2.85 (*) 4.22 - 5.81 MIL/uL   Hemoglobin 8.6 (*) 13.0 - 17.0 g/dL   Comment: POST TRANSFUSION SPECIMEN     DELTA CHECK NOTED   HCT 25.2 (*) 39.0 - 52.0 %   MCV 88.4  78.0 - 100.0 fL   MCH 30.2  26.0 - 34.0 pg   MCHC 34.1  30.0 - 36.0 g/dL   RDW 17.3 (*) 11.5 - 15.5 %   Platelets 5 (*) 150 - 400 K/uL   Comment: REPEATED TO VERIFY     DELTA CHECK NOTED     CRITICAL RESULT CALLED TO, READ BACK BY AND VERIFIED WITH:     Lauretta Grill AT 5456 ON 09.18.2015 BY  NBROOKS  BASIC METABOLIC PANEL     Status: Abnormal   Collection Time    03/18/14  4:00 AM      Result Value Ref Range   Sodium 139  137 - 147 mEq/L   Potassium 3.7  3.7 - 5.3 mEq/L   Chloride 100  96 - 112 mEq/L   CO2 26  19 - 32 mEq/L   Glucose, Bld 78  70 - 99 mg/dL   BUN 62 (*) 6 - 23 mg/dL   Creatinine, Ser 2.44 (*) 0.50 - 1.35 mg/dL   Calcium 8.4  8.4 - 10.5 mg/dL   GFR calc non Af Amer 26 (*) >90 mL/min   GFR calc Af Amer 31 (*) >90 mL/min   Comment: (NOTE)     The eGFR has been calculated using the CKD EPI equation.     This calculation has not been validated in all clinical situations.     eGFR's persistently <90 mL/min signify possible Chronic Kidney     Disease.   Anion gap 13  5 - 15  PHOSPHORUS     Status: None   Collection Time    03/18/14  4:00 AM      Result Value Ref Range   Phosphorus 3.0  2.3 - 4.6 mg/dL  MAGNESIUM     Status: None   Collection Time    03/18/14  4:00 AM      Result Value Ref Range   Magnesium 2.4  1.5 - 2.5 mg/dL  LACTIC ACID, PLASMA     Status: None   Collection Time    03/18/14  4:00 AM  Result Value Ref Range   Lactic Acid, Venous 1.2  0.5 - 2.2 mmol/L  SAVE SMEAR     Status: None   Collection Time    03/18/14  4:00 AM      Result Value Ref Range   Smear Review SMEAR STAINED AND AVAILABLE FOR REVIEW    GLUCOSE, CAPILLARY     Status: None   Collection Time    03/18/14  4:01 AM      Result Value Ref Range   Glucose-Capillary 91  70 - 99 mg/dL   Comment 1 Notify RN    PREPARE PLATELET PHERESIS     Status: None   Collection Time    03/18/14  9:00 AM      Result Value Ref Range   Unit Number [id number]     Blood Component Type PLTPHER LR2     Unit division 00     Status of Unit ISSUED     Transfusion Status OK TO TRANSFUSE     Unit Number [id number]     Blood Component Type PLTPHER LR1     Unit division 00     Status of Unit ISSUED     Transfusion Status OK TO TRANSFUSE    GLUCOSE, CAPILLARY     Status: Abnormal     Collection Time    03/18/14  9:11 AM      Result Value Ref Range   Glucose-Capillary 102 (*) 70 - 99 mg/dL   Comment 1 Documented in Chart     Comment 2 Notify RN    GLUCOSE, CAPILLARY     Status: Abnormal   Collection Time    03/18/14 12:24 PM      Result Value Ref Range   Glucose-Capillary 46 (*) 70 - 99 mg/dL   Comment 1 Documented in Chart     Comment 2 Notify RN    GLUCOSE, CAPILLARY     Status: Abnormal   Collection Time    03/18/14 12:49 PM      Result Value Ref Range   Glucose-Capillary 112 (*) 70 - 99 mg/dL   Comment 1 Documented in Chart     Comment 2 Notify RN      MICRO: 9/16 blood cx NGTD 9/16 urine cx NGTD 9/16 sputum inadequate 9/16 cdiff negative  IMAGING: Dg Chest Port 1 View  03/18/2014   CLINICAL DATA:  Pneumonia  EXAM: PORTABLE CHEST - 1 VIEW  COMPARISON:  03/18/2014  FINDINGS: Cardiac shadow is mildly enlarged. A right-sided PICC line is again noted with the catheter tip at cavoatrial junction. This is stable from the prior exam. Right pleural effusion as well as focal infiltrate is again identified similar to that seen on recent exam. Mild central vascular prominence is noted. No bony abnormality is seen.  IMPRESSION: Stable changes in the right lung base.  Mild vascular congestion.   Electronically Signed   By: Inez Catalina M.D.   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 Also found to have anemia, thrombocytopenia, elevated Cr concerning for side effects of cefazolin.  - continue with piptazo to cover possible HCAP as source of sepsis - would discontinue vancomycin and change to daptomycin at 63m/kg QOD, check baseline CK in order to avoid any further  nephrotoxicity - he will need treatment for mssa bacteremia (from previous  admission) until oct 2nd - 4 wk mark from port a cath removal. - thrombocytopenia and anemia could possibly be related to cephalosporin so it makes sense to discontinue - transaminitis likely related to hypotension from sepsis - would watch for spontaneous bleeding given plt 5K, hopefully will start to improve since cefazolin has been discontinued. - AKI, multifactorial = management per nephrology. - he has further hypothermia or fever, would repeat blood culture, and consider pulling picc line.  Elzie Rings [facility] [phone]

## 2014-03-19 ENCOUNTER — Inpatient Hospital Stay (HOSPITAL_COMMUNITY): Payer: Medicare Other

## 2014-03-19 DIAGNOSIS — R0602 Shortness of breath: Secondary | ICD-10-CM

## 2014-03-19 DIAGNOSIS — K7200 Acute and subacute hepatic failure without coma: Secondary | ICD-10-CM

## 2014-03-19 DIAGNOSIS — R7989 Other specified abnormal findings of blood chemistry: Secondary | ICD-10-CM

## 2014-03-19 DIAGNOSIS — N289 Disorder of kidney and ureter, unspecified: Secondary | ICD-10-CM

## 2014-03-19 LAB — COMPREHENSIVE METABOLIC PANEL
ALBUMIN: 2.4 g/dL — AB (ref 3.5–5.2)
ALT: 35 U/L (ref 0–53)
AST: 403 U/L — AB (ref 0–37)
Alkaline Phosphatase: 162 U/L — ABNORMAL HIGH (ref 39–117)
Anion gap: 15 (ref 5–15)
BUN: 55 mg/dL — ABNORMAL HIGH (ref 6–23)
CALCIUM: 8.5 mg/dL (ref 8.4–10.5)
CO2: 24 mEq/L (ref 19–32)
CREATININE: 2.49 mg/dL — AB (ref 0.50–1.35)
Chloride: 101 mEq/L (ref 96–112)
GFR calc Af Amer: 30 mL/min — ABNORMAL LOW (ref >90)
GFR calc non Af Amer: 26 mL/min — ABNORMAL LOW (ref >90)
Glucose, Bld: 71 mg/dL (ref 70–99)
Potassium: 3.7 mEq/L (ref 3.7–5.3)
SODIUM: 140 meq/L (ref 137–147)
TOTAL PROTEIN: 6.4 g/dL (ref 6.0–8.3)
Total Bilirubin: 1.2 mg/dL (ref 0.3–1.2)

## 2014-03-19 LAB — PREPARE PLATELET PHERESIS
UNIT DIVISION: 0
Unit division: 0

## 2014-03-19 LAB — GLUCOSE, CAPILLARY
GLUCOSE-CAPILLARY: 86 mg/dL (ref 70–99)
GLUCOSE-CAPILLARY: 58 mg/dL — AB (ref 70–99)
GLUCOSE-CAPILLARY: 124 mg/dL — AB (ref 70–99)
GLUCOSE-CAPILLARY: 115 mg/dL — AB (ref 70–99)
GLUCOSE-CAPILLARY: 197 mg/dL — AB (ref 70–99)
Glucose-Capillary: 143 mg/dL — ABNORMAL HIGH (ref 70–99)
Glucose-Capillary: 188 mg/dL — ABNORMAL HIGH (ref 70–99)

## 2014-03-19 LAB — CULTURE, RESPIRATORY

## 2014-03-19 LAB — CULTURE, RESPIRATORY W GRAM STAIN

## 2014-03-19 LAB — CBC
HCT: 24.8 % — ABNORMAL LOW (ref 39.0–52.0)
Hemoglobin: 8.5 g/dL — ABNORMAL LOW (ref 13.0–17.0)
MCH: 30.6 pg (ref 26.0–34.0)
MCHC: 34.3 g/dL (ref 30.0–36.0)
MCV: 89.2 fL (ref 78.0–100.0)
Platelets: 41 10*3/uL — ABNORMAL LOW (ref 150–400)
RBC: 2.78 MIL/uL — ABNORMAL LOW (ref 4.22–5.81)
RDW: 17.8 % — AB (ref 11.5–15.5)
WBC: 3.6 10*3/uL — ABNORMAL LOW (ref 4.0–10.5)

## 2014-03-19 MED ORDER — SODIUM CHLORIDE 0.9 % IV SOLN: INTRAVENOUS | Status: DC

## 2014-03-19 MED ORDER — SODIUM CHLORIDE 0.9 % IV SOLN
450.0000 mg | INTRAVENOUS | Status: DC
  Administered 2014-03-20: 450 mg via INTRAVENOUS
  Filled 2014-03-19: qty 9

## 2014-03-19 MED ORDER — HYDROCODONE-ACETAMINOPHEN 7.5-325 MG/15ML PO SOLN
15.0000 mL | Freq: Four times a day (QID) | ORAL | Status: DC | PRN
  Administered 2014-03-19: 15 mL
  Filled 2014-03-19 (×2): qty 15

## 2014-03-19 MED ORDER — DEXTROSE 50 % IV SOLN
INTRAVENOUS | Status: AC
  Administered 2014-03-19: 25 mL
  Filled 2014-03-19: qty 50

## 2014-03-19 NOTE — Plan of Care (Signed)
Problem: Phase I Progression Outcomes Goal: Tolerating diet Outcome: Progressing Tube feeding at goal

## 2014-03-19 NOTE — Progress Notes (Signed)
Hypoglycemic Event  CBG: 58 Treatment: D50 IV 25 mL   Symptoms: None  Follow-up CBG: Time: CBG Result:124  Possible Reasons for Event: Other: tube feeding off for ultrasound since 0100  Comments/MD notifiedyes    Michael Keith Michael Keith  Remember to initiate Hypoglycemia Order Set & complete

## 2014-03-19 NOTE — Progress Notes (Signed)
PROGRESS NOTE    Michael Keith BLT:[id number] DOB: [birth year] DOA: 03/29/2014 PCP: Philis Fendt, MD Oncology: Dr. 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 Discharged on cefazolin IV with plan to stop on 03/31/14. He presented to the ED 9/16 with acute dyspnea, negative PE protocol CT of chest but with new spiculated mass versus modular pneumonia, metabolic acidosis with respiratory compensation (lactate 10.4). Critical care team admitted him to ICU and transferred care to hospitalist service on 03/18/14. Upon discharge 03/07/14: Hemoglobin 10.2, platelet 158 and creatinine 0.87. Has subsequently developed acute renal failure, severe anemia and thrombocytopenia. Oncology, nephrology & ID consulted.   Assessment/Plan:  1. New right middle lobe mass, focal pneumonia (concern for septic emboli)/HCAP versus malignancy. On IV vancomycin and Zosyn since 9/15. We'll need followup imaging to ensure resolution. Mild hemoptysis has improved/resolved. Sputum culture shows GNR. Blood cultures x2: Negative to date. ID input appreciated- continue Zosyn. DC Vancomycin- switching to Daptomycin 9/20. Urine Legionella & Strep Ag negative. Flu panel: neg. HIV: neg 2. Severe Sepsis, present on admission: secondary to #1. Mx as above. Urine Cx, CDiff PCR negative. Sepsis features resolved 3. Acute renal failure, oliguric: Most likely from CIN Vs ATN. Foley in place-strict I&O, check urine sodium and creatinine, and monitor creatinine. Vancomycin- DC'ed. Nephrology  follow up appreciated. No history of NSAID/ACE/ARB use. Creatinine has plateaued. Reduce IVF. 4. Severe anemia: Likely related to sepsis/anemia of critical illness. No overt bleeding. Status post PRBC 9/17 and hemoglobin improved from 6.3 > 8.6. Transfuse as needed to keep hemoglobin greater than 7 g per DL. Stable. 5. Severe thrombocytopenia: Likely secondary to critical illness, severe sepsis, medications (Ancef). Hematology consulted and have reviewed peripheral smear which did not show clear-cut schistocytes. Hematology feels that HIT less likely and no evidence to suggest DIC, TTP or HUS. Recommend continued supportive care, change Ancef which may be contributing to thrombocytopenia, transfuse platelets to keep counts greater than 10 K. or for worsening bleeding. Improved after 2 units platelet transfusion on 9/18 6. Abnormal LFT's: AST and ALT were near normal 9/1. AST 1466 and ALT 120 on 9/17. Possibly shock liver. Improving. Hepatitis panel neg. liver unremarkable on abdominal ultrasound 7. History of chronic systolic CHF/non-ischemic cardiomyopathy with last EF 15% and global LV hypokinesis: A repeat echo 9/17 showed LVEF 15%, diffuse LV hypokinesis, grade 2 diastolic dysfunction moderate to severe TR, severe pulmonary hypertension and no mention about vegetations. TEE likely not possible due to his esophageal cancer. Some concern for SBE. Currently euvolemic or even on the dry side. Unable to use beta blocker secondary to soft blood pressures and ACE inhibitor/ARB secondary to acute renal failure. 8. Recent MSSA bacteremia: Port-A-Cath removed. Was discharged on Ancef via PICC line but currently changed to vancomycin and Zosyn. Concern for SBE. TTE without vegetations. Unable to do TEE do to esophageal cancer. Request ID opinion. Changing Abx to Daptomycin with stop date 04/01/2014  9. Anion gap metabolic acidosis/lactic acidosis with anion gap: ? Secondary to sepsis. Anion gap normalized.   10. Esophageal cancer: Oncology input appreciated. Outpatient followup. 11. Nutrition /severe protein calorie malnutrition: PEG  tube feeding. 12. Type II DM with hypoglycemia: DC'ed NPH insulin and continue SSI alone. Tube feeding and monitor closely. Had hypoglycemia this am while tube feeds were held for US abdomen. 13. Adult Failure to Thrive:  14. Cholelithiasis: Seen on ultrasound. No clinical features suggestive of acute cholecystitis.    Code Status:  Full Family Communication: Discussed with patients cousin (pt consented) Mr [name] on 9/18- updated care and answered questions. Disposition Plan: Remains in step down unit.   Consultants:  Critical care medicine-signed off 9/17  Hematology/oncology  Nephrology  Infectious Disease  Procedures:  Right upper extremity PICC-placed on previous admission  Foley catheter  Antibiotics:  IV Zosyn 9/15 >  IV vancomycin 9/15 >9/19   Subjective: Has cough but nonproductive today. Intermittent dyspnea. Feels sore at PEG tube site.   Objective: Filed Vitals:   03/19/14 0600 03/19/14 0800 03/19/14 1000 03/19/14 1200  BP: 132/74 106/57 133/82 119/78  Pulse: 82 85 45 91  Temp:  97.4 F (36.3 C)    TempSrc:  Oral    Resp: 22 29 24 23   Height:      Weight:      SpO2: 98% 100% 97% 97%    Intake/Output Summary (Last 24 hours) at 03/19/14 1330 Last data filed at 03/19/14 1200  Gross per 24 hour  Intake   2888 ml  Output    850 ml  Net   2038 ml   Filed Weights   03/17/14 0400 03/18/14 0500 03/19/14 0500  Weight: 53.524 kg (118 lb) 56.246 kg (124 lb) 57.017 kg (125 lb 11.2 oz)     Exam:  General exam:  pleasant middle-aged moderately built, cachectic chronically ill-looking male lying comfortably supine in bed Respiratory system:  diminished breath sounds bilaterally, especially in the bases with scattered few basal crackles. No wheezing or rhonchi . No increased work of breathing. Cardiovascular system: S1  & S2 heard, RRR. No JVD, murmurs, gallops, clicks or pedal edema. Telemetry: Sinus rhythm with frequent PVCs & bigemini Gastrointestinal system: Abdomen is nondistended, soft and nontender. Normal bowel sounds heard. PEG tube site intact.  Central nervous system: Alert and oriented. No focal neurological deficits. Extremities: Symmetric 5 x 5 power. Right upper extremity PICC site without acute findings  Skin: Dry and scaly.    Data Reviewed: Basic Metabolic Panel:  Recent Labs Lab 03/15/2014 0324 03/17/14 0419 03/18/14 0400 03/19/14 0603  NA 135* 139 139 140  K 4.6 3.9 3.7 3.7  CL 94* 100 100 101  CO2 15* 25 26 24   GLUCOSE 94 141* 78 71  BUN 45* 56* 62* 55*  CREATININE 1.34 1.97* 2.44* 2.49*  CALCIUM 8.2* 8.0* 8.4 8.5  MG  --   --  2.4  --   PHOS  --   --  3.0  --    Liver Function Tests:  Recent Labs Lab 03/02/2014 0324 03/17/14 0419 03/18/14 1410 03/19/14 0603  AST 842* 1466* 581* 403*  ALT 101* 120* 41 35  ALKPHOS 194* 153* 150* 162*  BILITOT 1.2 0.7 1.3* 1.2  PROT 6.5 5.8* 6.3 6.4  ALBUMIN 2.7* 2.3* 2.4* 2.4*   No results found for this basename: LIPASE, AMYLASE,  in the last 168 hours No results found for this basename: AMMONIA,  in the last 168 hours CBC:  Recent Labs Lab 03/13/2014 0324 03/02/2014 0712 03/17/14 0419 03/18/14 0400 03/18/14 2014 03/19/14 0603  WBC 7.6 7.1 5.2 4.1 4.0 3.6*  NEUTROABS 6.4 6.2  --   --   --   --  HGB 7.5* 7.1* 6.3* 8.6* 8.4* 8.5*  HCT 22.6* 21.4* 18.8* 25.2* 24.6* 24.8*  MCV 91.1 92.2 91.3 88.4 88.5 89.2  PLT 20* 19* 9* 5* 68* 41*   Cardiac Enzymes: No results found for this basename: CKTOTAL, CKMB, CKMBINDEX, TROPONINI,  in the last 168 hours BNP (last 3 results)  Recent Labs  02/28/14 1636 03/04/2014 0324  PROBNP 29729.0* 29933.0*   CBG:  Recent Labs Lab 03/19/14 0014 03/19/14 0411 03/19/14 0728 03/19/14 0829 03/19/14 1245  GLUCAP 143* 86 58* 124* 115*    Recent Results (from the past 240 hour(s))   CULTURE, BLOOD (ROUTINE X 2)     Status: None   Collection Time    03/15/2014  3:24 AM      Result Value Ref Range Status   Specimen Description BLOOD RIGHT UPPER ARM   Final   Special Requests BOTTLES DRAWN AEROBIC AND ANAEROBIC 5CC   Final   Culture  Setup Time     Final   Value: 03/06/2014 08:27     Performed at [facility]   Culture     Final   Value:        BLOOD CULTURE RECEIVED NO GROWTH TO DATE CULTURE WILL BE HELD FOR 5 DAYS BEFORE ISSUING A FINAL NEGATIVE REPORT     Performed at [facility]   Report Status PENDING   Incomplete  CULTURE, BLOOD (ROUTINE X 2)     Status: None   Collection Time    03/12/2014  5:15 AM      Result Value Ref Range Status   Specimen Description BLOOD LEFT FOREARM   Final   Special Requests BOTTLES DRAWN AEROBIC ONLY 2ML   Final   Culture  Setup Time     Final   Value: 03/13/2014 08:29     Performed at [facility]   Culture     Final   Value:        BLOOD CULTURE RECEIVED NO GROWTH TO DATE CULTURE WILL BE HELD FOR 5 DAYS BEFORE ISSUING A FINAL NEGATIVE REPORT     Note: Culture results may be compromised due to an inadequate volume of blood received in culture bottles.     Performed at [facility]   Report Status PENDING   Incomplete  URINE CULTURE     Status: None   Collection Time    03/19/2014  6:29 AM      Result Value Ref Range Status   Specimen Description URINE, CATHETERIZED   Final   Special Requests NONE   Final   Culture  Setup Time     Final   Value: 03/20/2014 11:58     Performed at [facility]     Final   Value: NO GROWTH     Performed at [facility]   Culture     Final   Value: NO GROWTH     Performed at [facility]   Report Status 03/17/2014 FINAL   Final  CLOSTRIDIUM DIFFICILE BY PCR     Status: None   Collection Time    03/15/2014  8:47 AM      Result Value Ref Range Status   C difficile by pcr NEGATIVE  NEGATIVE Final   Comment: Performed at [place], EXPECTORATED SPUTUM-ASSESSMENT     Status: None   Collection Time    03/03/2014  8:24 PM      Result Value  Ref Range Status   Specimen Description SPUTUM   Final   Special Requests NONE   Final   Sputum evaluation     Final   Value: MICROSCOPIC FINDINGS SUGGEST THAT THIS SPECIMEN IS NOT REPRESENTATIVE OF LOWER RESPIRATORY SECRETIONS. PLEASE RECOLLECT.     CALLED TO YOUNG,S/2W @2100  ON 03/08/2014 BY KARCZEWSKI,S.   Report Status 03/03/2014 FINAL   Final  CULTURE, EXPECTORATED SPUTUM-ASSESSMENT     Status: None   Collection Time    03/17/14 11:16 AM      Result Value Ref Range Status   Specimen Description SPUTUM   Final   Special Requests Immunocompromised   Final   Sputum evaluation     Final   Value: THIS SPECIMEN IS ACCEPTABLE. RESPIRATORY CULTURE REPORT TO FOLLOW.   Report Status 03/17/2014 FINAL   Final  CULTURE, RESPIRATORY (NON-EXPECTORATED)     Status: None   Collection Time    03/17/14 11:16 AM      Result Value Ref Range Status   Specimen Description SPUTUM   Final   Special Requests NONE   Final   Gram Stain     Final   Value: FEW WBC PRESENT,BOTH PMN AND MONONUCLEAR     FEW SQUAMOUS EPITHELIAL CELLS PRESENT     NO ORGANISMS SEEN     Performed at [facility]   Culture     Final   Value: RARE GRAM NEGATIVE RODS     Performed at [facility]   Report Status PENDING   Incomplete         Studies: US Abdomen Complete  03/19/2014   CLINICAL DATA:  Abnormal LFTs and acute kidney injury.  EXAM: ULTRASOUND ABDOMEN COMPLETE  COMPARISON:  Chest CT 03/08/2014. PET-CT 12/21/2013. Abdominal ultrasound 09/30/2009.  FINDINGS: Gallbladder:  Multiple echogenic, shadowing foci are seen in the gallbladder consistent with stones, largest measuring 8 mm. Gallbladder wall was thickened, measuring up to 6 mm. Sonographic Murphy's sign was negative.  Common bile duct:  Diameter: 8 mm (7 mm on prior ultrasound)  Liver:  No focal lesion identified.  Within normal limits in parenchymal echogenicity.  IVC:  No abnormality visualized.  Pancreas:  Slight prominence of the pancreatic duct measuring 4-5 mm in diameter, grossly similar to prior PET-CT.  Spleen:  Size and appearance within normal limits.  Right Kidney:  Length: 9.4 cm. Echogenicity within normal limits. No mass or hydronephrosis visualized.  Left Kidney:  Length: 10.4 cm. Echogenicity within normal limits. No mass or hydronephrosis visualized.  Abdominal aorta:  No aneurysm visualized. Aortic calcification. Distal abdominal aorta and bifurcation obscured by bowel gas.  Other findings:  Ascites and left pleural effusion or partially visualized.  IMPRESSION: 1. Cholelithiasis. The gallbladder wall is thickened, which may be secondary to hypoalbuminemia and a degree of generalized volume overload given ascites and pleural effusions. Acute cholecystitis is not excluded, however sonographic Murphy's sign was negative. 2. Slight prominence of the common bile duct and pancreatic duct, not significantly changed from prior studies. 3. Unremarkable appearance of the liver.   Electronically Signed   By: Logan Bores   On: 03/19/2014 10:00   Dg Chest Port 1 View  03/18/2014   CLINICAL DATA:  Pneumonia  EXAM: PORTABLE CHEST - 1 VIEW  COMPARISON:  03/07/2014  FINDINGS: Cardiac shadow is mildly enlarged. A right-sided PICC line is again noted with the catheter tip at cavoatrial junction. This is stable from the prior exam. Right pleural effusion as well as focal infiltrate  is again identified similar to that seen on recent exam. Mild central vascular prominence is noted. No bony abnormality is seen.  IMPRESSION: Stable changes in the right lung base.  Mild vascular congestion.   Electronically Signed   By: Inez Catalina M.D.   On: 03/18/2014 07:52        Scheduled Meds: . antiseptic oral rinse  7 mL Mouth Rinse q12n4p  . chlorhexidine  15 mL Mouth Rinse BID  . [START ON 03/20/2014] DAPTOmycin (CUBICIN)  IV   450 mg Intravenous Q48H  . folic acid  1 mg Per Tube Daily  . multivitamin with minerals  1 tablet Per Tube Daily  . nicotine  7 mg Transdermal Daily  . pantoprazole (PROTONIX) IV  40 mg Intravenous Q24H  . piperacillin-tazobactam (ZOSYN)  IV  3.375 g Intravenous 3 times per day  . sodium chloride  10-40 mL Intracatheter Q12H  . sodium chloride  3 mL Intravenous Q12H  . sterile water for irrigation  360 mL Irrigation 3 times per day  . sucralfate  1 g Per Tube TID  . terazosin  1 mg Per Tube QHS  . thiamine  100 mg Per Tube Daily   Continuous Infusions: . sodium chloride 20 mL/hr (03/19/14 0836)  . sodium chloride 10 mL/hr (03/19/14 1000)  . feeding supplement (OSMOLITE 1.5 CAL) 1,000 mL (03/19/14 1034)    Principal Problem:   Severe sepsis Active Problems:   Nonischemic cardiomyopathy   Chronic systolic CHF (congestive heart failure)   Squamous cell esophageal cancer   Severe protein-calorie malnutrition   Type II or unspecified type diabetes mellitus with renal manifestations, not stated as uncontrolled   Acute renal failure   Anemia, unspecified   Thrombocytopenia, unspecified   Acute hepatitis    Time spent: 45 minutes.    Vernell Leep, MD, FACP, FHM. [facility] Pager [phone]  If 7PM-7AM, please contact night-coverage [email or web address] Password [email or web address] 03/19/2014, 1:30 PM    LOS: 3 days

## 2014-03-19 NOTE — Evaluation (Signed)
Occupational Therapy Evaluation Patient Details Name: Michael Keith MRN: [id number] DOB: 09-15-1950 Today's Date: 03/19/2014    History of Present Illness 63 -year-old male with past medical history of esophageal cancer, status post PEG placement, history of COPD, chronic systolic CHF last 2D echo showing ejection fraction of 15%, recent admission for HCAP, MSSA bacteremia was supposed to finish ancef through 03/31/2014 who presented to [facility] ED 03/20/2014 with worsening shortness of breath over past 24 hours prior to this admission associated with productive cough.  Acute respiratory failure with hypoxia / healthcare acquired pneumonia / new right side focal pneumonia versus mass, possible embolic process / COPD exacerbation. Severe sepsis secondary to probable new right side focal pneumonia. Was here in hospital one week ago     Clinical Impression   This 63 yo male admitted and found to have above presents to acute OT with decreased endurance, decreased balance, increased pain, and decreased mobility all affecting his ability to care for himself. He will benefit from acute OT with follow up at at SNF to get to an Independent/Mod I level.    Follow Up Recommendations  SNF    Equipment Recommendations   (TBD at next venue)       Precautions / Restrictions Precautions Precautions: Fall Precaution Comments: monitor sats/NPO Restrictions Weight Bearing Restrictions: No      Mobility Bed Mobility Overal bed mobility: Needs Assistance Bed Mobility: Sit to Supine       Sit to supine: Min assist      Transfers Overall transfer level: Needs assistance Equipment used: 1 person hand held assist Transfers: Sit to/from Stand Sit to Stand: Min assist              Balance Overall balance assessment: Needs assistance Sitting-balance support: No upper extremity supported;Feet supported Sitting balance-Leahy Scale: Fair     Standing balance support: Bilateral upper extremity  supported Standing balance-Leahy Scale: Poor                              ADL Overall ADL's : Needs assistance/impaired Eating/Feeding: Independent;Sitting   Grooming: Set up;Sitting   Upper Body Bathing: Set up;Sitting   Lower Body Bathing: Maximal assistance (with min A sit<>stand)   Upper Body Dressing : Minimal assistance;Sitting   Lower Body Dressing: Maximal assistance (with min A sit<>stand)   Toilet Transfer: Minimal assistance;Squat-pivot;BSC   Toileting- Clothing Manipulation and Hygiene: Moderate assistance (with min A sit<>stand)         General ADL Comments: All activities take increasd time due to DOE--pt on 4liters of O2 (he says he is normally on only 2 liters)               Pertinent Vitals/Pain Pain Assessment: Faces Pain Location: peg tube site--RN made aware Pain Intervention(s): Monitored during session     Hand Dominance Right   Extremity/Trunk Assessment Upper Extremity Assessment Upper Extremity Assessment: Overall WFL for tasks assessed           Communication Communication Communication:  (difficult to talk due to DOE even with talking)   Cognition Arousal/Alertness: Awake/alert Behavior During Therapy: WFL for tasks assessed/performed Overall Cognitive Status: Within Functional Limits for tasks assessed                                Home Living Family/patient expects to be discharged to:: Skilled nursing facility  Prior Functioning/Environment          Comments: plans to return to a SNF for rehab.    OT Diagnosis: Generalized weakness;Acute pain   OT Problem List: Decreased strength;Decreased activity tolerance;Impaired balance (sitting and/or standing);Pain;Cardiopulmonary status limiting activity   OT Treatment/Interventions: Self-care/ADL training;Patient/family education;Balance training;DME and/or AE instruction;Energy  conservation;Therapeutic activities    OT Goals(Current goals can be found in the care plan section) Acute Rehab OT Goals Patient Stated Goal: back to rehab then home OT Goal Formulation: With patient Time For Goal Achievement: 04/02/14 Potential to Achieve Goals: Good  OT Frequency: Min 2X/week   Barriers to D/C: Decreased caregiver support             End of Session Equipment Utilized During Treatment: Oxygen (4 liters) Nurse Communication:  (pt had a bowell movement)  Activity Tolerance: Patient limited by fatigue Patient left: in bed;with call bell/phone within reach   Time: 1437-1500 OT Time Calculation (min): 23 min Charges:  OT General Charges $OT Visit: 1 Procedure OT Evaluation $Initial OT Evaluation Tier I: 1 Procedure OT Treatments $Self Care/Home Management : 8-22 mins  Almon Register [phone] 03/19/2014, 3:35 PM

## 2014-03-19 NOTE — Progress Notes (Addendum)
[facility] for Infectious Disease    Date of Admission:  03/10/2014   Total days of antibiotics 5        Day 5 vanco        Day 5 piptazo           ID: Michael Keith is a 63 y.o. male with SCC eso cancer who is being treated for MSSA bacteremia presents with sepsis concerning for HCAP, but also low plt,hct from sepsis and possibly cefazolin  Principal Problem:   Severe sepsis Active Problems:   Nonischemic cardiomyopathy   Chronic systolic CHF (congestive heart failure)   Squamous cell esophageal cancer   Severe protein-calorie malnutrition   Type II or unspecified type diabetes mellitus with renal manifestations, not stated as uncontrolled   Acute renal failure   Anemia, unspecified   Thrombocytopenia, unspecified   Acute hepatitis    Subjective: Afebrile, sitting up on edge of bed. Still has mild cough  Medications:  . antiseptic oral rinse  7 mL Mouth Rinse q12n4p  . chlorhexidine  15 mL Mouth Rinse BID  . folic acid  1 mg Per Tube Daily  . multivitamin with minerals  1 tablet Per Tube Daily  . nicotine  7 mg Transdermal Daily  . pantoprazole (PROTONIX) IV  40 mg Intravenous Q24H  . piperacillin-tazobactam (ZOSYN)  IV  3.375 g Intravenous 3 times per day  . sodium chloride  10-40 mL Intracatheter Q12H  . sodium chloride  3 mL Intravenous Q12H  . sterile water for irrigation  360 mL Irrigation 3 times per day  . sucralfate  1 g Per Tube TID  . terazosin  1 mg Per Tube QHS  . thiamine  100 mg Per Tube Daily    Objective: Vital signs in last 24 hours: Temp:  [96.7 F (35.9 C)-98.7 F (37.1 C)] 97.4 F (36.3 C) (09/19 0800) Pulse Rate:  [41-97] 45 (09/19 1000) Resp:  [22-46] 24 (09/19 1000) BP: (86-154)/(51-90) 133/82 mmHg (09/19 1000) SpO2:  [90 %-100 %] 97 % (09/19 1000) Weight:  [125 lb 11.2 oz (57.017 kg)] 125 lb 11.2 oz (57.017 kg) (09/19 0500) Physical Exam  Constitutional: He is oriented to person, place, and time. He appears well-developed and  well-nourished. No distress.  HENT:  Mouth/Throat: Oropharynx is clear and moist. No oropharyngeal exudate.  Cardiovascular: Normal rate, regular rhythm and normal heart sounds. Exam reveals no gallop and no friction rub.  No murmur heard.  Pulmonary/Chest: Effort normal and breath sounds normal. No respiratory distress. He has no wheezes.  Abdominal: Soft. Bowel sounds are normal. +PEG. mildly distension. There is no tenderness.  Ext: no clubbing or edema  Lab Results  Recent Labs  03/18/14 0400 03/18/14 2014 03/19/14 0603  WBC 4.1 4.0 3.6*  HGB 8.6* 8.4* 8.5*  HCT 25.2* 24.6* 24.8*  NA 139  --  140  K 3.7  --  3.7  CL 100  --  101  CO2 26  --  24  BUN 62*  --  55*  CREATININE 2.44*  --  2.49*   Liver Panel  Recent Labs  03/18/14 1410 03/19/14 0603  PROT 6.3 6.4  ALBUMIN 2.4* 2.4*  AST 581* 403*  ALT 41 35  ALKPHOS 150* 162*  BILITOT 1.3* 1.2  BILIDIR 0.7*  --   IBILI 0.6  --     Microbiology: 9/17 sputum cx : few gnr, id pending  Studies/Results: US Abdomen Complete  03/19/2014   CLINICAL DATA:  Abnormal LFTs and acute kidney injury.  EXAM: ULTRASOUND ABDOMEN COMPLETE  COMPARISON:  Chest CT 03/10/2014. PET-CT 12/21/2013. Abdominal ultrasound 09/30/2009.  FINDINGS: Gallbladder:  Multiple echogenic, shadowing foci are seen in the gallbladder consistent with stones, largest measuring 8 mm. Gallbladder wall was thickened, measuring up to 6 mm. Sonographic Murphy's sign was negative.  Common bile duct:  Diameter: 8 mm (7 mm on prior ultrasound)  Liver:  No focal lesion identified. Within normal limits in parenchymal echogenicity.  IVC:  No abnormality visualized.  Pancreas:  Slight prominence of the pancreatic duct measuring 4-5 mm in diameter, grossly similar to prior PET-CT.  Spleen:  Size and appearance within normal limits.  Right Kidney:  Length: 9.4 cm. Echogenicity within normal limits. No mass or hydronephrosis visualized.  Left Kidney:  Length: 10.4 cm.  Echogenicity within normal limits. No mass or hydronephrosis visualized.  Abdominal aorta:  No aneurysm visualized. Aortic calcification. Distal abdominal aorta and bifurcation obscured by bowel gas.  Other findings:  Ascites and left pleural effusion or partially visualized.  IMPRESSION: 1. Cholelithiasis. The gallbladder wall is thickened, which may be secondary to hypoalbuminemia and a degree of generalized volume overload given ascites and pleural effusions. Acute cholecystitis is not excluded, however sonographic Murphy's sign was negative. 2. Slight prominence of the common bile duct and pancreatic duct, not significantly changed from prior studies. 3. Unremarkable appearance of the liver.   Electronically Signed   By: Logan Bores   On: 03/19/2014 10:00   Dg Chest Port 1 View  03/18/2014   CLINICAL DATA:  Pneumonia  EXAM: PORTABLE CHEST - 1 VIEW  COMPARISON:  03/11/2014  FINDINGS: Cardiac shadow is mildly enlarged. A right-sided PICC line is again noted with the catheter tip at cavoatrial junction. This is stable from the prior exam. Right pleural effusion as well as focal infiltrate is again identified similar to that seen on recent exam. Mild central vascular prominence is noted. No bony abnormality is seen.  IMPRESSION: Stable changes in the right lung base.  Mild vascular congestion.   Electronically Signed   By: Inez Catalina M.D.   On: 03/18/2014 07:52    Assessment/Plan:  Sepsis- continue on piptazo, possibly related to HCAP, sputum cx identifying GNR.  MSSA bacteremia = he is to be on appropriate antibiotics. We will switch him to daptomycin starting tomorrow since he is likely therapeutic from vanco. Would dose at 8mg /kg QOD.  Will discontinue vancomycin today, to minimize nephrotoxity. Treat until oct 2nd.  AKI likely contrast induce and possibly hypotension from sepsis = defer to nephrology, continue to monitor cr and uop. Will dose adjust antibiotics  Thrombocytopenia =  improving.  Baxter Flattery [facility] for Infectious Diseases Cell: [phone] Pager: [phone]  03/19/2014, 11:17 AM

## 2014-03-19 NOTE — Progress Notes (Signed)
ANTIBIOTIC CONSULT NOTE - Follow Up  Pharmacy Consult for Daptomycin (from vancomycin)/zosyn Indication: Sepsis  No Known Allergies  Patient Measurements: Height: 5\' 6"  (167.6 cm) Weight: 125 lb 11.2 oz (57.017 kg) IBW/kg (Calculated) : 63.8  Vital Signs: Temp: 97.4 F (36.3 C) (09/19 0800) Temp src: Oral (09/19 0800) BP: 133/82 mmHg (09/19 1000) Pulse Rate: 45 (09/19 1000) Intake/Output from previous day: 09/18 0701 - 09/19 0700 In: 3423.1 [I.V.:1497.1; Blood:871; NG/GT:332.5; IV Piggyback:212.5] Out: 800 [Urine:800] Intake/Output from this shift: Total I/O In: 137.5 [I.V.:137.5] Out: 75 [Urine:75]  Labs:  Recent Labs  03/17/14 0419 03/18/14 0400 03/18/14 2014 03/19/14 0603  WBC 5.2 4.1 4.0 3.6*  HGB 6.3* 8.6* 8.4* 8.5*  PLT 9* 5* 68* 41*  CREATININE 1.97* 2.44*  --  2.49*   Estimated Creatinine Clearance: 24.2 ml/min (by C-G formula based on Cr of 2.49). No results found for this basename: VANCOTROUGH, VANCOPEAK, VANCORANDOM, GENTTROUGH, GENTPEAK, GENTRANDOM, TOBRATROUGH, TOBRAPEAK, TOBRARND, AMIKACINPEAK, AMIKACINTROU, AMIKACIN,  in the last 72 hours   Assessment: 63yoM presenting with shortness of breath and productive cough.  PMH esophageal cancer s/p PEG.  Discharged a week ago after treatment for HCAP and AECOPD; at that visit was started on vanc/Zosyn x 3 days, then to continue cefazolin 2g IV q8hr through Oct 1 for suspected CRBSI MSSA bacteremia.  Now noted to have anemia/thrombocytopenia from possible hemolysis. Also with CHF exacerbation and transaminitis, likely from demand ischemia. ID asked pharmacy to change vancomycin to daptomycin for gram + bacteremia (PNA appears to be gram - and remains on zosyn)  9/3 >> cefazolin >> 9/16 9/16 >> vancomycin >> 9/19 9/16 >> zosyn >>  9/19 >> daptomycin >>  Tmax: Afebrile WBCs: Low Renal: AKI, SCr 2.49, CrCl~65ml/min (CG), ~108ml/min (N) Both LA and PCT elevated.  9/16 blood: NGTD 9/16 urine: NG 9/16 Cdiff  PCR neg 9/17 sputum: rare GNR  Goal of Therapy:  Vancomycin trough level 15-20 mcg/ml Eradication of infection; appropriate renal dosing of antibiotics  Plan:   Daptomycin 450mg  (~8mg /kg) q48hr for CrCl < 38ml/min (start in am)  CPK in am and consider qweekly levels  Monitor renal fx and change to q24h once/if improves to >44ml/min  Continue zosyn 3.375gm q8h over 4h infusion  Doreene Eland, PharmD, BCPS.   Pager: [phone] 03/19/2014 11:24 AM

## 2014-03-19 NOTE — Progress Notes (Signed)
Patient ID: Michael Keith, male   DOB: [birth year], 63 y.o.   MRN: [id number] S:feels "bloated" O:BP 119/78  Pulse 91  Temp(Src) 97.4 F (36.3 C) (Oral)  Resp 23  Ht 5\' 6"  (1.676 m)  Wt 57.017 kg (125 lb 11.2 oz)  BMI 20.30 kg/m2  SpO2 97%  Intake/Output Summary (Last 24 hours) at 03/19/14 1257 Last data filed at 03/19/14 1200  Gross per 24 hour  Intake   3206 ml  Output    850 ml  Net   2356 ml   Intake/Output: I/O last 3 completed shifts: In: 4193.6 [I.V.:1510.1; Blood:1206; Other:870; NG/GT:332.5; IV Piggyback:275] Out: 800 [Urine:800]  Intake/Output this shift:  Total I/O In: 322.5 [I.V.:197.5; NG/GT:100; IV Piggyback:25] Out: 175 [Urine:175] Weight change: 0.771 kg (1 lb 11.2 oz) Gen:AAM who appears older than stated age in NAD CVS:no rub Resp:occ rhonchi ASN:[id number], +BS, NT Ext:no edema   Recent Labs Lab 03/17/2014 0324 03/17/14 0419 03/18/14 0400 03/18/14 1410 03/19/14 0603  NA 135* 139 139  --  140  K 4.6 3.9 3.7  --  3.7  CL 94* 100 100  --  101  CO2 15* 25 26  --  24  GLUCOSE 94 141* 78  --  71  BUN 45* 56* 62*  --  55*  CREATININE 1.34 1.97* 2.44*  --  2.49*  ALBUMIN 2.7* 2.3*  --  2.4* 2.4*  CALCIUM 8.2* 8.0* 8.4  --  8.5  PHOS  --   --  3.0  --   --   AST 842* 1466*  --  581* 403*  ALT 101* 120*  --  41 35   Liver Function Tests:  Recent Labs Lab 03/17/14 0419 03/18/14 1410 03/19/14 0603  AST 1466* 581* 403*  ALT 120* 41 35  ALKPHOS 153* 150* 162*  BILITOT 0.7 1.3* 1.2  PROT 5.8* 6.3 6.4  ALBUMIN 2.3* 2.4* 2.4*   No results found for this basename: LIPASE, AMYLASE,  in the last 168 hours No results found for this basename: AMMONIA,  in the last 168 hours CBC:  Recent Labs Lab 03/18/2014 0324 03/08/2014 0712 03/17/14 0419 03/18/14 0400 03/18/14 2014 03/19/14 0603  WBC 7.6 7.1 5.2 4.1 4.0 3.6*  NEUTROABS 6.4 6.2  --   --   --   --   HGB 7.5* 7.1* 6.3* 8.6* 8.4* 8.5*  HCT 22.6* 21.4* 18.8* 25.2* 24.6* 24.8*  MCV 91.1 92.2 91.3  88.4 88.5 89.2  PLT 20* 19* 9* 5* 68* 41*   Cardiac Enzymes: No results found for this basename: CKTOTAL, CKMB, CKMBINDEX, TROPONINI,  in the last 168 hours CBG:  Recent Labs Lab 03/18/14 1941 03/19/14 0014 03/19/14 0411 03/19/14 0728 03/19/14 0829  GLUCAP 93 143* 86 58* 124*    Iron Studies: No results found for this basename: IRON, TIBC, TRANSFERRIN, FERRITIN,  in the last 72 hours Studies/Results: US Abdomen Complete  03/19/2014   CLINICAL DATA:  Abnormal LFTs and acute kidney injury.  EXAM: ULTRASOUND ABDOMEN COMPLETE  COMPARISON:  Chest CT 03/04/2014. PET-CT 12/21/2013. Abdominal ultrasound 09/30/2009.  FINDINGS: Gallbladder:  Multiple echogenic, shadowing foci are seen in the gallbladder consistent with stones, largest measuring 8 mm. Gallbladder wall was thickened, measuring up to 6 mm. Sonographic Murphy's sign was negative.  Common bile duct:  Diameter: 8 mm (7 mm on prior ultrasound)  Liver:  No focal lesion identified. Within normal limits in parenchymal echogenicity.  IVC:  No abnormality visualized.  Pancreas:  Slight prominence of the  pancreatic duct measuring 4-5 mm in diameter, grossly similar to prior PET-CT.  Spleen:  Size and appearance within normal limits.  Right Kidney:  Length: 9.4 cm. Echogenicity within normal limits. No mass or hydronephrosis visualized.  Left Kidney:  Length: 10.4 cm. Echogenicity within normal limits. No mass or hydronephrosis visualized.  Abdominal aorta:  No aneurysm visualized. Aortic calcification. Distal abdominal aorta and bifurcation obscured by bowel gas.  Other findings:  Ascites and left pleural effusion or partially visualized.  IMPRESSION: 1. Cholelithiasis. The gallbladder wall is thickened, which may be secondary to hypoalbuminemia and a degree of generalized volume overload given ascites and pleural effusions. Acute cholecystitis is not excluded, however sonographic Murphy's sign was negative. 2. Slight prominence of the common bile duct  and pancreatic duct, not significantly changed from prior studies. 3. Unremarkable appearance of the liver.   Electronically Signed   By: Logan Bores   On: 03/19/2014 10:00   Dg Chest Port 1 View  03/18/2014   CLINICAL DATA:  Pneumonia  EXAM: PORTABLE CHEST - 1 VIEW  COMPARISON:  03/14/2014  FINDINGS: Cardiac shadow is mildly enlarged. A right-sided PICC line is again noted with the catheter tip at cavoatrial junction. This is stable from the prior exam. Right pleural effusion as well as focal infiltrate is again identified similar to that seen on recent exam. Mild central vascular prominence is noted. No bony abnormality is seen.  IMPRESSION: Stable changes in the right lung base.  Mild vascular congestion.   Electronically Signed   By: Inez Catalina M.D.   On: 03/18/2014 07:52   . antiseptic oral rinse  7 mL Mouth Rinse q12n4p  . chlorhexidine  15 mL Mouth Rinse BID  . [START ON 03/20/2014] DAPTOmycin (CUBICIN)  IV  450 mg Intravenous Q48H  . folic acid  1 mg Per Tube Daily  . multivitamin with minerals  1 tablet Per Tube Daily  . nicotine  7 mg Transdermal Daily  . pantoprazole (PROTONIX) IV  40 mg Intravenous Q24H  . piperacillin-tazobactam (ZOSYN)  IV  3.375 g Intravenous 3 times per day  . sodium chloride  10-40 mL Intracatheter Q12H  . sodium chloride  3 mL Intravenous Q12H  . sterile water for irrigation  360 mL Irrigation 3 times per day  . sucralfate  1 g Per Tube TID  . terazosin  1 mg Per Tube QHS  . thiamine  100 mg Per Tube Daily    BMET    Component Value Date/Time   NA 140 03/19/2014 0603   NA 136 02/22/2014 1016   K 3.7 03/19/2014 0603   K 4.6 02/22/2014 1016   CL 101 03/19/2014 0603   CO2 24 03/19/2014 0603   CO2 24 02/22/2014 1016   GLUCOSE 71 03/19/2014 0603   GLUCOSE 133 02/22/2014 1016   BUN 55* 03/19/2014 0603   BUN 18.9 02/22/2014 1016   CREATININE 2.49* 03/19/2014 0603   CREATININE 1.0 02/22/2014 1016   CALCIUM 8.5 03/19/2014 0603   CALCIUM 8.9 02/22/2014 1016    GFRNONAA 26* 03/19/2014 0603   GFRAA 30* 03/19/2014 0603   CBC    Component Value Date/Time   WBC 3.6* 03/19/2014 0603   WBC 3.5* 02/22/2014 1016   RBC 2.78* 03/19/2014 0603   RBC 2.69* 02/22/2014 1016   RBC 3.79* 10/03/2009 0445   HGB 8.5* 03/19/2014 0603   HGB 8.1* 02/22/2014 1016   HCT 24.8* 03/19/2014 0603   HCT 24.5* 02/22/2014 1016   PLT 41* 03/19/2014  0603   PLT 183 02/22/2014 1016   MCV 89.2 03/19/2014 0603   MCV 91.0 02/22/2014 1016   MCH 30.6 03/19/2014 0603   MCH 30.1 02/22/2014 1016   MCHC 34.3 03/19/2014 0603   MCHC 33.1 02/22/2014 1016   RDW 17.8* 03/19/2014 0603   RDW 14.6 02/22/2014 1016   LYMPHSABS 0.5* 03/22/2014 0712   LYMPHSABS 0.6* 02/22/2014 1016   MONOABS 0.4 03/06/2014 0712   MONOABS 0.7 02/22/2014 1016   EOSABS 0.0 03/01/2014 0712   EOSABS 0.2 02/22/2014 1016   BASOSABS 0.0 03/15/2014 0712   BASOSABS 0.0 02/22/2014 1016     Assessment/Plan:  1. AKI- most consistent with contrast-induced nephropathy but also possible ischemic ATN in setting of acute illness and anemia/hypotension.  Rate of rise of Scr has slowed.  Cont to follow.  No indication for dialysis at this point.  1. Avoid any more IV contrast 2. Keep SBP >100 3. Renal dose meds 4. Cont to monitor UOP and Scr daily 2. Anemia of chronic disease/malignancy  1. Transfuse per Heme/Onc 3. SIRS- per primary team. On vanc/zosyn, follow vanc trough levels closely 4. Elevated LFT's- likely from shock liver due to #3 5. Systolic CHF- stable 6. HCAP- per primary 7. MSSA bacteremia from porta-cath- was on Ancef now on zosyn/dapto 8. Thrombocytopenia- work up per Heme/Onc (?DIC or ancef) 9. Esophageal cancer- s/p chemo and XRT- no local recurrence 10. RML spiculated mass- w/u per Heme/Onc 11. Protein malnutrition- tube feed thru PEG tube per primary svc 12.   [place] A

## 2014-03-19 NOTE — Progress Notes (Signed)
PT Cancellation Note  Patient Details Name: Michael Keith MRN: [id number] DOB: 20-Jan-1951   Cancelled Treatment:     Pt very politely refused me this afternoon. Had been up in chair and just finished working with OT earlier. " I have had quit a bit today". I would like to walk tomorrow. Will check back with pt tomorrow.    Clide Dales 03/19/2014, 4:12 PM Clide Dales, PT Pager: [phone] 03/19/2014

## 2014-03-19 NOTE — Progress Notes (Signed)
Michael Keith is doing pretty well this morning. He had platelet transfusion yesterday. Looks like he got 2 units of for his platelets. His platelet count went up to 68,000. Today, split count is 41,000.  His tube feeds are doing okay. He has a feeding tube in place.  He's had no fever. His blood pressure has fluctuated.  He's had a little bit of a cough. He's not coughing up any kind of blood.  Today, his hemoglobin is 8.5. White cell count 3.6. His liver function tests were a little bit. AST is 430. Alkaline phosphatase is 162. He has some renal insufficiency with a BUN of 25 creatinine of 2.49.  I'm not sure as to why his liver functions is also elevated. On the 17th, his AST was over 1400. Again, this is coming down. On his CT angiogram, the visualized portions of the liver looked okay.  On his physical exam, his vital signs look okay. Temperature 97.4. Pulse 94. Blood pressure is 130/74. His lungs sound clear. Cardiac exam regular in rhythm. Abdomen soft. It is slightly distended. Bowel sounds are active but decreased. His feeding tube is intact. I cannot palpate his liver. Extremities shows no clubbing, cyanosis or edema. There is some muscle atrophy in his lower extremities.  We will continue to follow him. Hopefully, the thrombocytopenia is reflective of infection. He is on antibiotics.  We will continue to follow along.  I appreciate all the great care he is getting down in the ICU.!!!  Michael Keith  1 Michael Keith 4:16

## 2014-03-20 ENCOUNTER — Inpatient Hospital Stay (HOSPITAL_COMMUNITY): Payer: Medicare Other

## 2014-03-20 DIAGNOSIS — D696 Thrombocytopenia, unspecified: Secondary | ICD-10-CM

## 2014-03-20 DIAGNOSIS — J150 Pneumonia due to Klebsiella pneumoniae: Secondary | ICD-10-CM

## 2014-03-20 DIAGNOSIS — N179 Acute kidney failure, unspecified: Secondary | ICD-10-CM

## 2014-03-20 LAB — COMPREHENSIVE METABOLIC PANEL
ALBUMIN: 2.3 g/dL — AB (ref 3.5–5.2)
ALK PHOS: 160 U/L — AB (ref 39–117)
ALT: 28 U/L (ref 0–53)
AST: 261 U/L — AB (ref 0–37)
Anion gap: 14 (ref 5–15)
BUN: 51 mg/dL — ABNORMAL HIGH (ref 6–23)
CALCIUM: 7.5 mg/dL — AB (ref 8.4–10.5)
CO2: 25 mEq/L (ref 19–32)
Chloride: 100 mEq/L (ref 96–112)
Creatinine, Ser: 2.34 mg/dL — ABNORMAL HIGH (ref 0.50–1.35)
GFR calc Af Amer: 32 mL/min — ABNORMAL LOW (ref >90)
GFR calc non Af Amer: 28 mL/min — ABNORMAL LOW (ref >90)
Glucose, Bld: 190 mg/dL — ABNORMAL HIGH (ref 70–99)
POTASSIUM: 4.5 meq/L (ref 3.7–5.3)
Sodium: 139 mEq/L (ref 137–147)
TOTAL PROTEIN: 6.2 g/dL (ref 6.0–8.3)
Total Bilirubin: 0.9 mg/dL (ref 0.3–1.2)

## 2014-03-20 LAB — CBC
HCT: 24.9 % — ABNORMAL LOW (ref 39.0–52.0)
HEMOGLOBIN: 8.3 g/dL — AB (ref 13.0–17.0)
MCH: 30.4 pg (ref 26.0–34.0)
MCHC: 33.3 g/dL (ref 30.0–36.0)
MCV: 91.2 fL (ref 78.0–100.0)
PLATELETS: 21 10*3/uL — AB (ref 150–400)
RBC: 2.73 MIL/uL — ABNORMAL LOW (ref 4.22–5.81)
RDW: 18.3 % — ABNORMAL HIGH (ref 11.5–15.5)
WBC: 3.3 10*3/uL — ABNORMAL LOW (ref 4.0–10.5)

## 2014-03-20 LAB — GLUCOSE, CAPILLARY
GLUCOSE-CAPILLARY: 176 mg/dL — AB (ref 70–99)
GLUCOSE-CAPILLARY: 178 mg/dL — AB (ref 70–99)
Glucose-Capillary: 190 mg/dL — ABNORMAL HIGH (ref 70–99)
Glucose-Capillary: 185 mg/dL — ABNORMAL HIGH (ref 70–99)
Glucose-Capillary: 142 mg/dL — ABNORMAL HIGH (ref 70–99)
Glucose-Capillary: 191 mg/dL — ABNORMAL HIGH (ref 70–99)

## 2014-03-20 LAB — CK: Total CK: 28 U/L (ref 7–232)

## 2014-03-20 MED ORDER — METOPROLOL TARTRATE 1 MG/ML IV SOLN
5.0000 mg | Freq: Once | INTRAVENOUS | Status: AC
  Administered 2014-03-20: 5 mg via INTRAVENOUS
  Filled 2014-03-20: qty 5

## 2014-03-20 NOTE — Progress Notes (Signed)
[facility] for Infectious Disease    Date of Admission:  03/28/2014   Total days of antibiotics 6        Day 1 dapto ( previously 5 d of vanco)        Day 6 piptazo           ID: Michael Keith is a 63 y.o. male with SCC eso cancer who is being treated for MSSA bacteremia presents with sepsis concerning for HCAP, but also low plt,hct from sepsis and possibly cefazolin  Principal Problem:   Severe sepsis Active Problems:   Nonischemic cardiomyopathy   Chronic systolic CHF (congestive heart failure)   Squamous cell esophageal cancer   Severe protein-calorie malnutrition   Type II or unspecified type diabetes mellitus with renal manifestations, not stated as uncontrolled   Acute renal failure   Anemia, unspecified   Thrombocytopenia, unspecified   Acute hepatitis    Subjective: Afebrile,  Still has mild cough, possibly blood tinged  Medications:  . antiseptic oral rinse  7 mL Mouth Rinse q12n4p  . chlorhexidine  15 mL Mouth Rinse BID  . DAPTOmycin (CUBICIN)  IV  450 mg Intravenous Q48H  . folic acid  1 mg Per Tube Daily  . multivitamin with minerals  1 tablet Per Tube Daily  . nicotine  7 mg Transdermal Daily  . pantoprazole (PROTONIX) IV  40 mg Intravenous Q24H  . piperacillin-tazobactam (ZOSYN)  IV  3.375 g Intravenous 3 times per day  . sodium chloride  10-40 mL Intracatheter Q12H  . sodium chloride  3 mL Intravenous Q12H  . sterile water for irrigation  360 mL Irrigation 3 times per day  . sucralfate  1 g Per Tube TID  . terazosin  1 mg Per Tube QHS  . thiamine  100 mg Per Tube Daily    Objective: Vital signs in last 24 hours: Temp:  [96.8 F (36 C)-98.3 F (36.8 C)] 97.8 F (36.6 C) (09/20 0755) Pulse Rate:  [29-92] 56 (09/20 0800) Resp:  [19-34] 20 (09/20 0800) BP: (119-141)/(59-87) 126/59 mmHg (09/20 0800) SpO2:  [96 %-100 %] 96 % (09/20 0800) Weight:  [128 lb 14.4 oz (58.469 kg)] 128 lb 14.4 oz (58.469 kg) (09/20 0500) Did not examine  Lab  Results  Recent Labs  03/19/14 0603 03/20/14 0430  WBC 3.6* 3.3*  HGB 8.5* 8.3*  HCT 24.8* 24.9*  NA 140 139  K 3.7 4.5  CL 101 100  CO2 24 25  BUN 55* 51*  CREATININE 2.49* 2.34*   Liver Panel  Recent Labs  03/18/14 1410 03/19/14 0603 03/20/14 0430  PROT 6.3 6.4 6.2  ALBUMIN 2.4* 2.4* 2.3*  AST 581* 403* 261*  ALT 41 35 28  ALKPHOS 150* 162* 160*  BILITOT 1.3* 1.2 0.9  BILIDIR 0.7*  --   --   IBILI 0.6  --   --     Microbiology: 9/17 sputum cx :kleb pneumonaie  Studies/Results: US Abdomen Complete  03/19/2014   CLINICAL DATA:  Abnormal LFTs and acute kidney injury.  EXAM: ULTRASOUND ABDOMEN COMPLETE  COMPARISON:  Chest CT 03/15/2014. PET-CT 12/21/2013. Abdominal ultrasound 09/30/2009.  FINDINGS: Gallbladder:  Multiple echogenic, shadowing foci are seen in the gallbladder consistent with stones, largest measuring 8 mm. Gallbladder wall was thickened, measuring up to 6 mm. Sonographic Murphy's sign was negative.  Common bile duct:  Diameter: 8 mm (7 mm on prior ultrasound)  Liver:  No focal lesion identified. Within normal limits in parenchymal  echogenicity.  IVC:  No abnormality visualized.  Pancreas:  Slight prominence of the pancreatic duct measuring 4-5 mm in diameter, grossly similar to prior PET-CT.  Spleen:  Size and appearance within normal limits.  Right Kidney:  Length: 9.4 cm. Echogenicity within normal limits. No mass or hydronephrosis visualized.  Left Kidney:  Length: 10.4 cm. Echogenicity within normal limits. No mass or hydronephrosis visualized.  Abdominal aorta:  No aneurysm visualized. Aortic calcification. Distal abdominal aorta and bifurcation obscured by bowel gas.  Other findings:  Ascites and left pleural effusion or partially visualized.  IMPRESSION: 1. Cholelithiasis. The gallbladder wall is thickened, which may be secondary to hypoalbuminemia and a degree of generalized volume overload given ascites and pleural effusions. Acute cholecystitis is not  excluded, however sonographic Murphy's sign was negative. 2. Slight prominence of the common bile duct and pancreatic duct, not significantly changed from prior studies. 3. Unremarkable appearance of the liver.   Electronically Signed   By: Logan Bores   On: 03/19/2014 10:00   Dg Chest Port 1 View  03/20/2014   CLINICAL DATA:  Follow-up pneumonia  EXAM: PORTABLE CHEST - 1 VIEW  COMPARISON:  03/18/2014; 03/10/2014; 04/25/2010 chest CT - 03/29/2014; PET-CT - 12/21/2013  FINDINGS: Grossly unchanged enlarged cardiac silhouette and mediastinal contours. Stable position of support apparatus. No change to slight worsening of ill-defined heterogeneous opacities within the peripheral aspect of the right mid and lower lung. Unchanged small bilateral effusions. Left basilar / retrocardiac opacities have minimally increased in the interval. Mild pulmonary venous congestion without frank evidence of edema. No pneumothorax. Unchanged bones.  IMPRESSION: 1. No change to slight worsening of ill-defined heterogeneous airspace opacities within the right mid and lower lung, possibly atelectasis though worrisome for residual infection. A follow-up chest radiograph in 4 to 6 weeks after treatment is recommended to ensure resolution. 2. Slight worsening of left basilar/retrocardiac opacities, likely atelectasis. 3. Unchanged small bilateral effusions.   Electronically Signed   By: Sandi Mariscal M.D.   On: 03/20/2014 10:24    Assessment/Plan:  Sepsis 2/2 kleb pneumoniae plus SE of prior antibiotics- continue on piptazo for kleb pneumonia. Currently on day 6 of 7. Would discontinue on tuesday  MSSA bacteremia = keep on daptomycin at 8mg /kg QOD.  Treat until oct 2nd. Will need weekly CK checked in addition to BMP as an outpatient  AKI likely contrast induce and possibly hypotension from sepsis = defer to nephrology, continue to monitor cr and uop. Will dose adjust antibiotics  Thrombocytopenia = remains low again, still likely  side effect from cefazolin. Anticipate that this should normalize over the next 3-4 days. Continue to monitor for spontaneous bleeds, tsf if plt =10K or if evidence of bleeding at current level of [place], [facility] for Infectious Diseases Cell: [phone] Pager: [phone]  03/20/2014, 10:36 AM

## 2014-03-20 NOTE — Progress Notes (Signed)
Patient ID: Michael Keith, male   DOB: 04-21-1951, 63 y.o.   MRN: [id number] S:No new complaints this am and reports that the foley cath was taken out this morning O:BP 126/59  Pulse 56  Temp(Src) 97.8 F (36.6 C) (Oral)  Resp 20  Ht 5\' 6"  (1.676 m)  Wt 58.469 kg (128 lb 14.4 oz)  BMI 20.82 kg/m2  SpO2 96%  Intake/Output Summary (Last 24 hours) at 03/20/14 0998 Last data filed at 03/20/14 0900  Gross per 24 hour  Intake   2690 ml  Output    695 ml  Net   1995 ml   Intake/Output: I/O last 3 completed shifts: In: 3987.5 [I.V.:1527.5; Other:920; NG/GT:1240; IV Piggyback:300] Out: 1070 [Urine:1070]  Intake/Output this shift:  Total I/O In: 140 [I.V.:40; NG/GT:100] Out: 150 [Urine:150] Weight change: 1.452 kg (3 lb 3.2 oz) PJA:SNKNL, ill-appearing AAM who appears older than stated age CVS:no rub Resp:scattered rhonchi ZJQ:BHALPFXTK, +BS, soft, NT Ext:no edema   Recent Labs Lab 03/27/2014 0324 03/17/14 0419 03/18/14 0400 03/18/14 1410 03/19/14 0603 03/20/14 0430  NA 135* 139 139  --  140 139  K 4.6 3.9 3.7  --  3.7 4.5  CL 94* 100 100  --  101 100  CO2 15* 25 26  --  24 25  GLUCOSE 94 141* 78  --  71 190*  BUN 45* 56* 62*  --  55* 51*  CREATININE 1.34 1.97* 2.44*  --  2.49* 2.34*  ALBUMIN 2.7* 2.3*  --  2.4* 2.4* 2.3*  CALCIUM 8.2* 8.0* 8.4  --  8.5 7.5*  PHOS  --   --  3.0  --   --   --   AST 842* 1466*  --  581* 403* 261*  ALT 101* 120*  --  41 35 28   Liver Function Tests:  Recent Labs Lab 03/18/14 1410 03/19/14 0603 03/20/14 0430  AST 581* 403* 261*  ALT 41 35 28  ALKPHOS 150* 162* 160*  BILITOT 1.3* 1.2 0.9  PROT 6.3 6.4 6.2  ALBUMIN 2.4* 2.4* 2.3*   No results found for this basename: LIPASE, AMYLASE,  in the last 168 hours No results found for this basename: AMMONIA,  in the last 168 hours CBC:  Recent Labs Lab 03/13/2014 0324 03/06/2014 0712 03/17/14 0419 03/18/14 0400 03/18/14 2014 03/19/14 0603 03/20/14 0430  WBC 7.6 7.1 5.2 4.1 4.0 3.6*  3.3*  NEUTROABS 6.4 6.2  --   --   --   --   --   HGB 7.5* 7.1* 6.3* 8.6* 8.4* 8.5* 8.3*  HCT 22.6* 21.4* 18.8* 25.2* 24.6* 24.8* 24.9*  MCV 91.1 92.2 91.3 88.4 88.5 89.2 91.2  PLT 20* 19* 9* 5* 68* 41* 21*   Cardiac Enzymes:  Recent Labs Lab 03/20/14 0430  CKTOTAL 28   CBG:  Recent Labs Lab 03/19/14 1546 03/19/14 2044 03/20/14 0207 03/20/14 0432 03/20/14 0749  GLUCAP 197* 188* 176* 178* 190*    Iron Studies: No results found for this basename: IRON, TIBC, TRANSFERRIN, FERRITIN,  in the last 72 hours Studies/Results: US Abdomen Complete  03/19/2014   CLINICAL DATA:  Abnormal LFTs and acute kidney injury.  EXAM: ULTRASOUND ABDOMEN COMPLETE  COMPARISON:  Chest CT 03/05/2014. PET-CT 12/21/2013. Abdominal ultrasound 09/30/2009.  FINDINGS: Gallbladder:  Multiple echogenic, shadowing foci are seen in the gallbladder consistent with stones, largest measuring 8 mm. Gallbladder wall was thickened, measuring up to 6 mm. Sonographic Murphy's sign was negative.  Common bile duct:  Diameter:  8 mm (7 mm on prior ultrasound)  Liver:  No focal lesion identified. Within normal limits in parenchymal echogenicity.  IVC:  No abnormality visualized.  Pancreas:  Slight prominence of the pancreatic duct measuring 4-5 mm in diameter, grossly similar to prior PET-CT.  Spleen:  Size and appearance within normal limits.  Right Kidney:  Length: 9.4 cm. Echogenicity within normal limits. No mass or hydronephrosis visualized.  Left Kidney:  Length: 10.4 cm. Echogenicity within normal limits. No mass or hydronephrosis visualized.  Abdominal aorta:  No aneurysm visualized. Aortic calcification. Distal abdominal aorta and bifurcation obscured by bowel gas.  Other findings:  Ascites and left pleural effusion or partially visualized.  IMPRESSION: 1. Cholelithiasis. The gallbladder wall is thickened, which may be secondary to hypoalbuminemia and a degree of generalized volume overload given ascites and pleural effusions.  Acute cholecystitis is not excluded, however sonographic Murphy's sign was negative. 2. Slight prominence of the common bile duct and pancreatic duct, not significantly changed from prior studies. 3. Unremarkable appearance of the liver.   Electronically Signed   By: Logan Bores   On: 03/19/2014 10:00   . antiseptic oral rinse  7 mL Mouth Rinse q12n4p  . chlorhexidine  15 mL Mouth Rinse BID  . DAPTOmycin (CUBICIN)  IV  450 mg Intravenous Q48H  . folic acid  1 mg Per Tube Daily  . multivitamin with minerals  1 tablet Per Tube Daily  . nicotine  7 mg Transdermal Daily  . pantoprazole (PROTONIX) IV  40 mg Intravenous Q24H  . piperacillin-tazobactam (ZOSYN)  IV  3.375 g Intravenous 3 times per day  . sodium chloride  10-40 mL Intracatheter Q12H  . sodium chloride  3 mL Intravenous Q12H  . sterile water for irrigation  360 mL Irrigation 3 times per day  . sucralfate  1 g Per Tube TID  . terazosin  1 mg Per Tube QHS  . thiamine  100 mg Per Tube Daily    BMET    Component Value Date/Time   NA 139 03/20/2014 0430   NA 136 02/22/2014 1016   K 4.5 03/20/2014 0430   K 4.6 02/22/2014 1016   CL 100 03/20/2014 0430   CO2 25 03/20/2014 0430   CO2 24 02/22/2014 1016   GLUCOSE 190* 03/20/2014 0430   GLUCOSE 133 02/22/2014 1016   BUN 51* 03/20/2014 0430   BUN 18.9 02/22/2014 1016   CREATININE 2.34* 03/20/2014 0430   CREATININE 1.0 02/22/2014 1016   CALCIUM 7.5* 03/20/2014 0430   CALCIUM 8.9 02/22/2014 1016   GFRNONAA 28* 03/20/2014 0430   GFRAA 32* 03/20/2014 0430   CBC    Component Value Date/Time   WBC 3.3* 03/20/2014 0430   WBC 3.5* 02/22/2014 1016   RBC 2.73* 03/20/2014 0430   RBC 2.69* 02/22/2014 1016   RBC 3.79* 10/03/2009 0445   HGB 8.3* 03/20/2014 0430   HGB 8.1* 02/22/2014 1016   HCT 24.9* 03/20/2014 0430   HCT 24.5* 02/22/2014 1016   PLT 21* 03/20/2014 0430   PLT 183 02/22/2014 1016   MCV 91.2 03/20/2014 0430   MCV 91.0 02/22/2014 1016   MCH 30.4 03/20/2014 0430   MCH 30.1 02/22/2014 1016   MCHC  33.3 03/20/2014 0430   MCHC 33.1 02/22/2014 1016   RDW 18.3* 03/20/2014 0430   RDW 14.6 02/22/2014 1016   LYMPHSABS 0.5* 03/02/2014 0712   LYMPHSABS 0.6* 02/22/2014 1016   MONOABS 0.4 03/18/2014 0712   MONOABS 0.7 02/22/2014 1016   EOSABS  0.0 03/17/2014 0712   EOSABS 0.2 02/22/2014 1016   BASOSABS 0.0 03/20/2014 0712   BASOSABS 0.0 02/22/2014 1016     Assessment/Plan:  1. AKI- most consistent with contrast-induced nephropathy but also possible ischemic ATN in setting of acute illness and anemia/hypotension. Scr has started to improve slowly. Cont to follow. No indication for dialysis at this point (although pt is a marginal candidate at best for long-term dialysis given his poor functional and nutritional status and uncertain esophageal cancer prognosis).  May be helpful to get a better handle on his Esophageal cancer prognosis from Oncology and consider palliative care consult to help set goals/limits of care.  1. Avoid any more IV contrast 2. Keep SBP >100 3. Renal dose meds 4. Cont to monitor UOP and Scr daily 2. Anemia of chronic disease/malignancy  1. Transfuse per Heme/Onc 3. SIRS- per primary team. On vanc/zosyn, follow vanc trough levels closely 4. Elevated LFT's- likely from shock liver due to #3 5. Systolic CHF- stable 6. HCAP- per primary (resp cultures + for Klebsiella), ID following 7. MSSA bacteremia from porta-cath- was on Ancef now on zosyn/dapto 8. Thrombocytopenia- work up per Heme/Onc (?DIC vs ancef vs malignancy) 9. Esophageal cancer- s/p chemo and XRT- no local recurrence 10. RML spiculated mass- w/u per Heme/Onc 11. Protein malnutrition- tube feed thru PEG tube per primary svc 12.   [place] A

## 2014-03-20 NOTE — Evaluation (Signed)
Physical Therapy Evaluation Patient Details Name: Michael Keith MRN: [id number] DOB: 05/05/1951 Today's Date: 03/20/2014   History of Present Illness  63 -year-old male with past medical history of esophageal cancer, status post PEG placement, history of COPD, chronic systolic CHF last 2D echo showing ejection fraction of 15%, recent admission for HCAP, MSSA bacteremia was supposed to finish ancef through 03/31/2014 who presented to [facility] ED 03/20/2014 with worsening shortness of breath over past 24 hours prior to this admission associated with productive cough.  Acute respiratory failure with hypoxia / healthcare acquired pneumonia / new right side focal pneumonia versus mass, possible embolic process / COPD exacerbation. Severe sepsis secondary to probable new right side focal pneumonia. Was here in hospital one week ago    Clinical Impression  *Pt admitted with severe sepsis, CHF, acute respiratory failure**. Pt currently with functional limitations due to the deficits listed below (see PT Problem List). ** Pt will benefit from skilled PT to increase their independence and safety with mobility to allow discharge to the venue listed below.   Pt took a few steps from bed to recliner with min assist. Activity tolerance limited by SOB. He performed BLE strengthening exercises well.   **    Follow Up Recommendations SNF    Equipment Recommendations  None recommended by PT    Recommendations for Other Services OT consult     Precautions / Restrictions Precautions Precautions: Fall Precaution Comments: monitor sats/NPO Restrictions Weight Bearing Restrictions: No      Mobility  Bed Mobility Overal bed mobility: Needs Assistance Bed Mobility: Supine to Sit     Supine to sit: Min assist     General bed mobility comments: assist to raise trunk  Transfers Overall transfer level: Needs assistance Equipment used: 1 person hand held assist Transfers: Sit to/from Stand Sit to Stand: Min  guard         General transfer comment: min/guard for balance  Ambulation/Gait Ambulation/Gait assistance: Min assist Ambulation Distance (Feet): 3 Feet Assistive device: 1 person hand held assist Gait Pattern/deviations: Decreased step length - left;Decreased step length - right Gait velocity: decreased   General Gait Details: min A for balance  Stairs            Wheelchair Mobility    Modified Rankin (Stroke Patients Only)       Balance   Sitting-balance support: No upper extremity supported;Feet supported Sitting balance-Leahy Scale: Good       Standing balance-Leahy Scale: Fair                               Pertinent Vitals/Pain Pain Assessment: No/denies pain    Home Living Family/patient expects to be discharged to:: Skilled nursing facility                      Prior Function           Comments: plans to return to a SNF for rehab.     Hand Dominance   Dominant Hand: Right    Extremity/Trunk Assessment   Upper Extremity Assessment: Overall WFL for tasks assessed           Lower Extremity Assessment: Overall WFL for tasks assessed      Cervical / Trunk Assessment: Normal  Communication   Communication: No difficulties (difficult to talk due to DOE even with talking)  Cognition Arousal/Alertness: Awake/alert Behavior During Therapy: WFL for tasks assessed/performed Overall  Cognitive Status: Within Functional Limits for tasks assessed                      General Comments      Exercises General Exercises - Lower Extremity Ankle Circles/Pumps: AROM;Both;10 reps Quad Sets: AROM;Both;5 reps;Supine Gluteal Sets: AROM;Both;5 reps Short Arc Quad: AROM;Both;10 reps;Supine Heel Slides: AROM;Both;10 reps;Supine Hip ABduction/ADduction: AROM;Both;10 reps;Supine      Assessment/Plan    PT Assessment Patient needs continued PT services  PT Diagnosis Difficulty walking;Generalized weakness   PT  Problem List Decreased activity tolerance;Decreased balance;Decreased mobility  PT Treatment Interventions DME instruction;Gait training;Stair training;Functional mobility training;Therapeutic activities;Therapeutic exercise;Patient/family education   PT Goals (Current goals can be found in the Care Plan section) Acute Rehab PT Goals Patient Stated Goal: back to rehab then home PT Goal Formulation: With patient Time For Goal Achievement: 04/03/14 Potential to Achieve Goals: Good    Frequency Min 3X/week   Barriers to discharge        Co-evaluation               End of Session Equipment Utilized During Treatment: Gait belt;Oxygen Activity Tolerance: Patient limited by fatigue Patient left: in chair;with call bell/phone within reach Nurse Communication: Mobility status         Time: 1236-1300 PT Time Calculation (min): 24 min   Charges:   PT Evaluation $Initial PT Evaluation Tier I: 1 Procedure PT Treatments $Therapeutic Exercise: 8-22 mins $Therapeutic Activity: 8-22 mins   PT G Codes:          Blondell Reveal Kistler 03/20/2014, 1:08 PM [phone]

## 2014-03-20 NOTE — Progress Notes (Deleted)
Patient c/o abdominal gas and bloating, abdomen slightly distended and bowel sounds audible.Gastric residue 150cc of undigested tube feed pushed back.MD made aware.MD also informed of CXR results. Will continue to minitor

## 2014-03-20 NOTE — Progress Notes (Deleted)
Report called to RN 3 [place] pending transfer

## 2014-03-20 NOTE — Progress Notes (Signed)
PROGRESS NOTE    Michael Keith OJJ:[id number] DOB: [birth year] DOA: 03/03/2014 PCP: Philis Fendt, MD Oncology: Dr. 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 Discharged on cefazolin IV with plan to stop on 03/31/14. He presented to the ED 9/16 with acute dyspnea, negative PE protocol CT of chest but with new spiculated mass versus modular pneumonia, metabolic acidosis with respiratory compensation (lactate 10.4). Critical care team admitted him to ICU and transferred care to hospitalist service on 03/18/14. Upon discharge 03/07/14: Hemoglobin 10.2, platelet 158 and creatinine 0.87. Has subsequently developed acute renal failure, severe anemia and thrombocytopenia. Oncology, nephrology & ID consulted.   Assessment/Plan:  1. New right middle lobe mass, focal pneumonia (concern for septic emboli)/HCAP -Klebsiella versus malignancy. Initially on IV vancomycin and Zosyn. We'll need followup imaging to ensure resolution. Mild hemoptysis has improved/resolved. Sputum culture shows Klebsiella. Blood cultures x2: Negative to date. ID input appreciated- continue Zosyn. DC Vancomycin- switching to Daptomycin 9/20. Urine Legionella & Strep Ag negative. Flu panel: neg. HIV: neg. Chest x-ray without significant change. 2. Severe Sepsis, present on admission: secondary to #1. Mx as above. Urine Cx, CDiff PCR negative. Sepsis features resolved 3. Acute renal failure, oliguric: Most likely from CIN Vs ATN. Vancomycin- DC'ed. Nephrology follow up appreciated. No  history of NSAID/ACE/ARB use. Creatinine has plateaued. Reduce IVF. Foley catheter DC'd. 4. Severe anemia: Likely related to sepsis/anemia of critical illness. No overt bleeding. Status post PRBC 9/17 and hemoglobin improved from 6.3 > 8.6. Transfuse as needed to keep hemoglobin greater than 7 g per DL. Stable. 5. Severe thrombocytopenia: Likely secondary to critical illness, severe sepsis, medications (Ancef). Hematology consulted and have reviewed peripheral smear which did not show clear-cut schistocytes. Hematology feels that HIT less likely and no evidence to suggest DIC, TTP or HUS. Recommend continued supportive care, changed Ancef which may be contributing to thrombocytopenia, transfuse platelets to keep counts greater than 10 K. or for worsening bleeding. Improved after 2 units platelet transfusion on 9/18 but gradually dropping. Hold transfusion today. 6. Abnormal LFT's: AST and ALT were near normal 9/1. AST 1466 and ALT 120 on 9/17. Possibly shock liver. Improving. Hepatitis panel neg. Liver unremarkable on abdominal ultrasound 7. History of chronic systolic CHF/non-ischemic cardiomyopathy with last EF 15% and global LV hypokinesis/frequent PVCs and bigemini: A repeat echo 9/17 showed LVEF 15%, diffuse LV hypokinesis, grade 2 diastolic dysfunction moderate to severe TR, severe pulmonary hypertension and no mention about vegetations. TEE likely not possible due to his esophageal cancer. Some concern for SBE. Currently euvolemic or even on the dry side. Unable to use beta blocker secondary to soft blood pressures and ACE inhibitor/ARB secondary to acute renal failure. K>4 & Mg >2 8. Recent MSSA bacteremia: Port-A-Cath removed. Was discharged on Ancef via PICC line but currently changed to vancomycin and Zosyn. Concern for SBE. TTE without vegetations. Unable to do TEE do to esophageal cancer. ID followup appreciated. Changing Abx to Daptomycin with stop date 04/01/2014  9. Anion gap metabolic  acidosis/lactic acidosis with anion gap: ? Secondary to sepsis. Anion gap normalized.  10. Esophageal cancer: Oncology input appreciated.  Outpatient followup. 11. Nutrition /severe protein calorie malnutrition: PEG tube feeding. 12. Type II DM with hypoglycemia: DC'ed NPH insulin and SSI. Tube feeding and monitor closely. No further hypoglycemic episodes. 13. Adult Failure to Thrive:  14. Cholelithiasis: Seen on ultrasound. No clinical features suggestive of acute cholecystitis.    Code Status:  Full Family Communication: Discussed with patients cousin (pt consented) Michael Keith on 9/18- updated care and answered questions. Disposition Plan: Remains in step down unit.   Consultants:  Critical care medicine-signed off 9/17  Hematology/oncology  Nephrology  Infectious Disease  Procedures:  Right upper extremity PICC-placed on previous admission  Foley catheter-DC'd 9/20  Antibiotics:  IV Zosyn 9/15 >  IV vancomycin 9/15 >9/19   IV daptomycin 9/20 >  Subjective: Mostly nonproductive cough but at times with minimal blood-as per nursing. Denies dyspnea.  Objective: Filed Vitals:   03/20/14 0800 03/20/14 0900 03/20/14 1000 03/20/14 1100  BP: 126/59 127/61 127/95 122/67  Pulse: 56 58 89 78  Temp:      TempSrc:      Resp: 20 22 26 22   Height:      Weight:      SpO2: 96% 98% 99% 98%    Intake/Output Summary (Last 24 hours) at 03/20/14 1234 Last data filed at 03/20/14 1021  Gross per 24 hour  Intake   2709 ml  Output    695 ml  Net   2014 ml   Filed Weights   03/18/14 0500 03/19/14 0500 03/20/14 0500  Weight: 56.246 kg (124 lb) 57.017 kg (125 lb 11.2 oz) 58.469 kg (128 lb 14.4 oz)     Exam:  General exam:  pleasant middle-aged moderately built, cachectic chronically ill-looking male lying comfortably supine in bed Respiratory system:  diminished breath sounds bilaterally, especially in the bases with scattered few basal crackles. No wheezing or rhonchi  . No increased work of breathing. Cardiovascular system: S1 & S2 heard, RRR. No JVD, murmurs, gallops, clicks or pedal edema. Telemetry: Sinus rhythm with frequent PVCs & bigemini Gastrointestinal system: Abdomen is nondistended, soft and nontender. Normal bowel sounds heard. PEG tube site intact.  Central nervous system: Alert and oriented. No focal neurological deficits. Extremities: Symmetric 5 x 5 power. Right upper extremity PICC site without acute findings  Skin: Dry and scaly.    Data Reviewed: Basic Metabolic Panel:  Recent Labs Lab 03/07/2014 0324 03/17/14 0419 03/18/14 0400 03/19/14 0603 03/20/14 0430  NA 135* 139 139 140 139  K 4.6 3.9 3.7 3.7 4.5  CL 94* 100 100 101 100  CO2 15* 25 26 24 25   GLUCOSE 94 141* 78 71 190*  BUN 45* 56* 62* 55* 51*  CREATININE 1.34 1.97* 2.44* 2.49* 2.34*  CALCIUM 8.2* 8.0* 8.4 8.5 7.5*  MG  --   --  2.4  --   --   PHOS  --   --  3.0  --   --    Liver Function Tests:  Recent Labs Lab 03/27/2014 0324 03/17/14 0419 03/18/14 1410 03/19/14 0603 03/20/14 0430  AST 842* 1466* 581* 403* 261*  ALT 101* 120* 41 35 28  ALKPHOS 194* 153* 150* 162* 160*  BILITOT 1.2 0.7 1.3* 1.2 0.9  PROT 6.5 5.8* 6.3 6.4 6.2  ALBUMIN 2.7* 2.3* 2.4* 2.4* 2.3*   No results found for this basename: LIPASE, AMYLASE,  in the last 168 hours No results found for this basename: AMMONIA,  in the last 168 hours CBC:  Recent Labs Lab 03/10/2014 0324  03/20/2014 6712 03/17/14 0419 03/18/14 0400 03/18/14 2014 03/19/14 0603 03/20/14 0430  WBC 7.6 7.1 5.2 4.1 4.0 3.6* 3.3*  NEUTROABS 6.4 6.2  --   --   --   --   --   HGB 7.5* 7.1* 6.3* 8.6* 8.4* 8.5* 8.3*  HCT 22.6* 21.4* 18.8* 25.2* 24.6* 24.8* 24.9*  MCV 91.1 92.2 91.3 88.4 88.5 89.2 91.2  PLT 20* 19* 9* 5* 68* 41* 21*   Cardiac Enzymes:  Recent Labs Lab 03/20/14 0430  CKTOTAL 28   BNP (last 3 results)  Recent Labs  02/28/14 1636 03/20/2014 0324  PROBNP 29729.0* 29933.0*   CBG:  Recent Labs Lab  03/19/14 2044 03/20/14 0207 03/20/14 0432 03/20/14 0749 03/20/14 1130  GLUCAP 188* 176* 178* 190* 185*    Recent Results (from the past 240 hour(s))  CULTURE, BLOOD (ROUTINE X 2)     Status: None   Collection Time    03/22/2014  3:24 AM      Result Value Ref Range Status   Specimen Description BLOOD RIGHT UPPER ARM   Final   Special Requests BOTTLES DRAWN AEROBIC AND ANAEROBIC 5CC   Final   Culture  Setup Time     Final   Value: 03/10/2014 08:27     Performed at [facility]   Culture     Final   Value:        BLOOD CULTURE RECEIVED NO GROWTH TO DATE CULTURE WILL BE HELD FOR 5 DAYS BEFORE ISSUING A FINAL NEGATIVE REPORT     Performed at [facility]   Report Status PENDING   Incomplete  CULTURE, BLOOD (ROUTINE X 2)     Status: None   Collection Time    03/08/2014  5:15 AM      Result Value Ref Range Status   Specimen Description BLOOD LEFT FOREARM   Final   Special Requests BOTTLES DRAWN AEROBIC ONLY 2ML   Final   Culture  Setup Time     Final   Value: 03/08/2014 08:29     Performed at [facility]   Culture     Final   Value:        BLOOD CULTURE RECEIVED NO GROWTH TO DATE CULTURE WILL BE HELD FOR 5 DAYS BEFORE ISSUING A FINAL NEGATIVE REPORT     Note: Culture results may be compromised due to an inadequate volume of blood received in culture bottles.     Performed at [facility]   Report Status PENDING   Incomplete  URINE CULTURE     Status: None   Collection Time    03/20/2014  6:29 AM      Result Value Ref Range Status   Specimen Description URINE, CATHETERIZED   Final   Special Requests NONE   Final   Culture  Setup Time     Final   Value: 03/15/2014 11:58     Performed at [place]     Final   Value: NO GROWTH     Performed at [facility]   Culture     Final   Value: NO GROWTH     Performed at [facility]   Report Status 03/17/2014 FINAL   Final  CLOSTRIDIUM DIFFICILE BY PCR     Status:  None   Collection Time    03/02/2014  8:47 AM      Result Value Ref Range Status   C difficile by pcr  NEGATIVE  NEGATIVE Final   Comment: Performed at [place], EXPECTORATED SPUTUM-ASSESSMENT     Status: None   Collection Time    03/18/2014  8:24 PM      Result Value Ref Range Status   Specimen Description SPUTUM   Final   Special Requests NONE   Final   Sputum evaluation     Final   Value: MICROSCOPIC FINDINGS SUGGEST THAT THIS SPECIMEN IS NOT REPRESENTATIVE OF LOWER RESPIRATORY SECRETIONS. PLEASE RECOLLECT.     CALLED TO YOUNG,S/2W @2100  ON 03/28/2014 BY KARCZEWSKI,S.   Report Status 03/28/2014 FINAL   Final  CULTURE, EXPECTORATED SPUTUM-ASSESSMENT     Status: None   Collection Time    03/17/14 11:16 AM      Result Value Ref Range Status   Specimen Description SPUTUM   Final   Special Requests Immunocompromised   Final   Sputum evaluation     Final   Value: THIS SPECIMEN IS ACCEPTABLE. RESPIRATORY CULTURE REPORT TO FOLLOW.   Report Status 03/17/2014 FINAL   Final  CULTURE, RESPIRATORY (NON-EXPECTORATED)     Status: None   Collection Time    03/17/14 11:16 AM      Result Value Ref Range Status   Specimen Description SPUTUM   Final   Special Requests NONE   Final   Gram Stain     Final   Value: FEW WBC PRESENT,BOTH PMN AND MONONUCLEAR     FEW SQUAMOUS EPITHELIAL CELLS PRESENT     NO ORGANISMS SEEN     Performed at [facility]   Culture     Final   Value: RARE KLEBSIELLA PNEUMONIAE     Performed at [facility]   Report Status 03/19/2014 FINAL   Final   Organism ID, Bacteria KLEBSIELLA PNEUMONIAE   Final         Studies: US Abdomen Complete  03/19/2014   CLINICAL DATA:  Abnormal LFTs and acute kidney injury.  EXAM: ULTRASOUND ABDOMEN COMPLETE  COMPARISON:  Chest CT 03/13/2014. PET-CT 12/21/2013. Abdominal ultrasound 09/30/2009.  FINDINGS: Gallbladder:  Multiple echogenic, shadowing foci are seen in the gallbladder consistent with stones,  largest measuring 8 mm. Gallbladder wall was thickened, measuring up to 6 mm. Sonographic Murphy's sign was negative.  Common bile duct:  Diameter: 8 mm (7 mm on prior ultrasound)  Liver:  No focal lesion identified. Within normal limits in parenchymal echogenicity.  IVC:  No abnormality visualized.  Pancreas:  Slight prominence of the pancreatic duct measuring 4-5 mm in diameter, grossly similar to prior PET-CT.  Spleen:  Size and appearance within normal limits.  Right Kidney:  Length: 9.4 cm. Echogenicity within normal limits. No mass or hydronephrosis visualized.  Left Kidney:  Length: 10.4 cm. Echogenicity within normal limits. No mass or hydronephrosis visualized.  Abdominal aorta:  No aneurysm visualized. Aortic calcification. Distal abdominal aorta and bifurcation obscured by bowel gas.  Other findings:  Ascites and left pleural effusion or partially visualized.  IMPRESSION: 1. Cholelithiasis. The gallbladder wall is thickened, which may be secondary to hypoalbuminemia and a degree of generalized volume overload given ascites and pleural effusions. Acute cholecystitis is not excluded, however sonographic Murphy's sign was negative. 2. Slight prominence of the common bile duct and pancreatic duct, not significantly changed from prior studies. 3. Unremarkable appearance of the liver.   Electronically Signed   By: Logan Bores   On: 03/19/2014 10:00   Dg Chest Port 1 View  03/20/2014   CLINICAL  DATA:  Follow-up pneumonia  EXAM: PORTABLE CHEST - 1 VIEW  COMPARISON:  03/18/2014; 03/19/2014; 04/25/2010 chest CT - 03/15/2014; PET-CT - 12/21/2013  FINDINGS: Grossly unchanged enlarged cardiac silhouette and mediastinal contours. Stable position of support apparatus. No change to slight worsening of ill-defined heterogeneous opacities within the peripheral aspect of the right mid and lower lung. Unchanged small bilateral effusions. Left basilar / retrocardiac opacities have minimally increased in the interval. Mild  pulmonary venous congestion without frank evidence of edema. No pneumothorax. Unchanged bones.  IMPRESSION: 1. No change to slight worsening of ill-defined heterogeneous airspace opacities within the right mid and lower lung, possibly atelectasis though worrisome for residual infection. A follow-up chest radiograph in 4 to 6 weeks after treatment is recommended to ensure resolution. 2. Slight worsening of left basilar/retrocardiac opacities, likely atelectasis. 3. Unchanged small bilateral effusions.   Electronically Signed   By: Sandi Mariscal M.D.   On: 03/20/2014 10:24        Scheduled Meds: . antiseptic oral rinse  7 mL Mouth Rinse q12n4p  . chlorhexidine  15 mL Mouth Rinse BID  . DAPTOmycin (CUBICIN)  IV  450 mg Intravenous Q48H  . folic acid  1 mg Per Tube Daily  . multivitamin with minerals  1 tablet Per Tube Daily  . nicotine  7 mg Transdermal Daily  . pantoprazole (PROTONIX) IV  40 mg Intravenous Q24H  . piperacillin-tazobactam (ZOSYN)  IV  3.375 g Intravenous 3 times per day  . sodium chloride  10-40 mL Intracatheter Q12H  . sodium chloride  3 mL Intravenous Q12H  . sterile water for irrigation  360 mL Irrigation 3 times per day  . sucralfate  1 g Per Tube TID  . terazosin  1 mg Per Tube QHS  . thiamine  100 mg Per Tube Daily   Continuous Infusions: . sodium chloride 20 mL/hr at 03/19/14 2000  . sodium chloride 10 mL/hr at 03/19/14 2000  . feeding supplement (OSMOLITE 1.5 CAL) 1,000 mL (03/20/14 0829)    Principal Problem:   Severe sepsis Active Problems:   Nonischemic cardiomyopathy   Chronic systolic CHF (congestive heart failure)   Squamous cell esophageal cancer   Severe protein-calorie malnutrition   Type II or unspecified type diabetes mellitus with renal manifestations, not stated as uncontrolled   Acute renal failure   Anemia, unspecified   Thrombocytopenia, unspecified   Acute hepatitis    Time spent: 45 minutes.    Vernell Leep, MD, FACP, FHM. [facility] Pager [phone]  If 7PM-7AM, please contact night-coverage [email or web address] Password [email or web address] 03/20/2014, 12:34 PM    LOS: 4 days

## 2014-03-20 NOTE — Progress Notes (Signed)
Patient c/o abdominal gas and bloating, abdomen slightly distended and bowel sounds audible.Gastric residue 150cc of undigested tube feed pushed back.MD made aware.MD also informed of CXR results. Will continue to minitor

## 2014-03-20 NOTE — Progress Notes (Signed)
Michael Keith is doing okay. He did not do any physical therapy yesterday. They came by. He had been in the chair.  He is on his these right now.  His platelet count is him back down. His platelet count is 21,000 today.  If his blood counts he is to drop, we may have to consider a bone marrow test on him. One possibility is marrow involvement by malignancy. However, one might think that there would be some evidence of this on his blood smear. I'll have to check out the blood smear again.  There is no bleeding. He continues on antibiotics. He is on [place] and Zosyn. He is growing Klebsiella in his sputum. This is sensitive to the Zosyn.  I suppose his thrombocytopenia could be from this gram negative rods infection.  I would not transfuse him right now.  He is afebrile. His blood pressure is 123/80.  His exam is pretty much unchanged.  We will go ahead and continue follow him along.  I am very grateful for the outstanding care that he is getting in the ICU.

## 2014-03-21 ENCOUNTER — Inpatient Hospital Stay (HOSPITAL_COMMUNITY): Payer: Medicare Other

## 2014-03-21 DIAGNOSIS — I428 Other cardiomyopathies: Secondary | ICD-10-CM

## 2014-03-21 DIAGNOSIS — I4729 Other ventricular tachycardia: Secondary | ICD-10-CM

## 2014-03-21 DIAGNOSIS — I509 Heart failure, unspecified: Secondary | ICD-10-CM

## 2014-03-21 DIAGNOSIS — I5022 Chronic systolic (congestive) heart failure: Secondary | ICD-10-CM

## 2014-03-21 DIAGNOSIS — J9620 Acute and chronic respiratory failure, unspecified whether with hypoxia or hypercapnia: Secondary | ICD-10-CM

## 2014-03-21 DIAGNOSIS — I472 Ventricular tachycardia: Secondary | ICD-10-CM

## 2014-03-21 DIAGNOSIS — I469 Cardiac arrest, cause unspecified: Secondary | ICD-10-CM

## 2014-03-21 DIAGNOSIS — I471 Supraventricular tachycardia: Secondary | ICD-10-CM

## 2014-03-21 DIAGNOSIS — I5023 Acute on chronic systolic (congestive) heart failure: Secondary | ICD-10-CM

## 2014-03-21 LAB — COMPREHENSIVE METABOLIC PANEL
ALT: 27 U/L (ref 0–53)
ALT: 25 U/L (ref 0–53)
AST: 191 U/L — ABNORMAL HIGH (ref 0–37)
AST: 184 U/L — AB (ref 0–37)
Albumin: 2.5 g/dL — ABNORMAL LOW (ref 3.5–5.2)
Albumin: 2.5 g/dL — ABNORMAL LOW (ref 3.5–5.2)
Alkaline Phosphatase: 169 U/L — ABNORMAL HIGH (ref 39–117)
Alkaline Phosphatase: 166 U/L — ABNORMAL HIGH (ref 39–117)
Anion gap: 13 (ref 5–15)
Anion gap: 16 — ABNORMAL HIGH (ref 5–15)
BILIRUBIN TOTAL: 1.1 mg/dL (ref 0.3–1.2)
BUN: 44 mg/dL — ABNORMAL HIGH (ref 6–23)
BUN: 47 mg/dL — ABNORMAL HIGH (ref 6–23)
CHLORIDE: 104 meq/L (ref 96–112)
CO2: 24 mEq/L (ref 19–32)
CO2: 23 meq/L (ref 19–32)
Calcium: 8.6 mg/dL (ref 8.4–10.5)
Calcium: 8.8 mg/dL (ref 8.4–10.5)
Chloride: 102 mEq/L (ref 96–112)
Creatinine, Ser: 1.97 mg/dL — ABNORMAL HIGH (ref 0.50–1.35)
Creatinine, Ser: 2.01 mg/dL — ABNORMAL HIGH (ref 0.50–1.35)
GFR calc Af Amer: 40 mL/min — ABNORMAL LOW (ref >90)
GFR calc Af Amer: 39 mL/min — ABNORMAL LOW (ref >90)
GFR calc non Af Amer: 34 mL/min — ABNORMAL LOW (ref >90)
GFR, EST NON AFRICAN AMERICAN: 33 mL/min — AB (ref >90)
Glucose, Bld: 155 mg/dL — ABNORMAL HIGH (ref 70–99)
Glucose, Bld: 155 mg/dL — ABNORMAL HIGH (ref 70–99)
Potassium: 3.9 mEq/L (ref 3.7–5.3)
Potassium: 4 mEq/L (ref 3.7–5.3)
SODIUM: 143 meq/L (ref 137–147)
Sodium: 139 mEq/L (ref 137–147)
Total Bilirubin: 0.9 mg/dL (ref 0.3–1.2)
Total Protein: 6.8 g/dL (ref 6.0–8.3)
Total Protein: 6.7 g/dL (ref 6.0–8.3)

## 2014-03-21 LAB — CBC
HCT: 26.7 % — ABNORMAL LOW (ref 39.0–52.0)
HCT: 26.9 % — ABNORMAL LOW (ref 39.0–52.0)
Hemoglobin: 8.7 g/dL — ABNORMAL LOW (ref 13.0–17.0)
Hemoglobin: 8.9 g/dL — ABNORMAL LOW (ref 13.0–17.0)
MCH: 30.1 pg (ref 26.0–34.0)
MCH: 30.3 pg (ref 26.0–34.0)
MCHC: 32.6 g/dL (ref 30.0–36.0)
MCHC: 33.1 g/dL (ref 30.0–36.0)
MCV: 90.9 fL (ref 78.0–100.0)
MCV: 93 fL (ref 78.0–100.0)
PLATELETS: 16 10*3/uL — AB (ref 150–400)
Platelets: 17 10*3/uL — CL (ref 150–400)
RBC: 2.87 MIL/uL — ABNORMAL LOW (ref 4.22–5.81)
RBC: 2.96 MIL/uL — ABNORMAL LOW (ref 4.22–5.81)
RDW: 18.8 % — ABNORMAL HIGH (ref 11.5–15.5)
RDW: 18.9 % — ABNORMAL HIGH (ref 11.5–15.5)
WBC: 3.5 10*3/uL — AB (ref 4.0–10.5)
WBC: 3.5 10*3/uL — ABNORMAL LOW (ref 4.0–10.5)

## 2014-03-21 LAB — TSH: TSH: 2.83 u[IU]/mL (ref 0.350–4.500)

## 2014-03-21 LAB — MAGNESIUM: MAGNESIUM: 2.1 mg/dL (ref 1.5–2.5)

## 2014-03-21 LAB — GLUCOSE, CAPILLARY
GLUCOSE-CAPILLARY: 186 mg/dL — AB (ref 70–99)
GLUCOSE-CAPILLARY: 179 mg/dL — AB (ref 70–99)

## 2014-03-21 LAB — SAVE SMEAR

## 2014-03-21 LAB — LACTIC ACID, PLASMA: Lactic Acid, Venous: 2 mmol/L (ref 0.5–2.2)

## 2014-03-21 MED ORDER — AMIODARONE HCL IN DEXTROSE 360-4.14 MG/200ML-% IV SOLN
30.0000 mg/h | INTRAVENOUS | Status: DC
  Filled 2014-03-21: qty 200

## 2014-03-21 MED ORDER — CARVEDILOL 3.125 MG PO TABS
3.1250 mg | ORAL_TABLET | Freq: Two times a day (BID) | ORAL | Status: DC
  Administered 2014-03-21: 3.125 mg
  Filled 2014-03-21: qty 1

## 2014-03-21 MED ORDER — FUROSEMIDE 10 MG/ML IJ SOLN
20.0000 mg | Freq: Every day | INTRAMUSCULAR | Status: DC
  Administered 2014-03-21: 20 mg via INTRAVENOUS
  Filled 2014-03-21: qty 2

## 2014-03-21 MED ORDER — FENTANYL CITRATE 0.05 MG/ML IJ SOLN: 100.0000 ug | INTRAMUSCULAR | Status: DC | PRN

## 2014-03-21 MED ORDER — AMIODARONE LOAD VIA INFUSION
150.0000 mg | Freq: Once | INTRAVENOUS | Status: DC
  Filled 2014-03-21: qty 83.34

## 2014-03-21 MED ORDER — ADENOSINE 6 MG/2ML IV SOLN
INTRAVENOUS | Status: AC
  Administered 2014-03-21: 6 mg
  Filled 2014-03-21: qty 6

## 2014-03-21 MED ORDER — AMIODARONE HCL IN DEXTROSE 360-4.14 MG/200ML-% IV SOLN: 60.0000 mg/h | INTRAVENOUS | Status: DC

## 2014-03-21 MED ORDER — SODIUM CHLORIDE 0.9 % IV BOLUS (SEPSIS)
500.0000 mL | Freq: Once | INTRAVENOUS | Status: AC
  Administered 2014-03-21: 500 mL via INTRAVENOUS

## 2014-03-21 MED ORDER — FUROSEMIDE 10 MG/ML IJ SOLN
40.0000 mg | Freq: Once | INTRAMUSCULAR | Status: AC
  Administered 2014-03-21: 40 mg via INTRAVENOUS
  Filled 2014-03-21: qty 4

## 2014-03-21 MED FILL — Medication: Qty: 1 | Status: AC

## 2014-03-22 LAB — HEPARIN INDUCED THROMBOCYTOPENIA PNL
Heparin Induced Plt Ab: NEGATIVE
PATIENT O. D.: 0.17
UFH HIGH DOSE UFH H: 0 %
UFH LOW DOSE 0.5 IU/ML: 5 %
UFH Low Dose 0.1 IU/mL: 0 % Release
UFH SRA Result: NEGATIVE

## 2014-03-22 LAB — CULTURE, BLOOD (ROUTINE X 2)
CULTURE: NO GROWTH
Culture: NO GROWTH

## 2014-03-22 LAB — GLUCOSE, CAPILLARY: GLUCOSE-CAPILLARY: 126 mg/dL — AB (ref 70–99)

## 2014-03-31 NOTE — Progress Notes (Signed)
On 03/20/14, Notified K.Schorr, NP about pt HR sustaining between 180- 185 after using BSC.  Pt was administered Metoprolol10 mg during ST and administered 512ml bolus of NS during these events of tachycardia that took place between 03/20/14 at 2000 and 01. Pt administered Adenosine 6mg  at 0139. pt PEG was held at 0100. Pt states he felt uncomfortable and felt more distended. NO residuals apirated from PEG.  He began to have prolonged exhalations and labored breathing, JVD, fine crackles in bilateral lower lobes and wheezes. pt diaphoretic and afebrile. Pt responds appropriately during series of events however felt very uncomfortable. Pt was administered lasix and neb treatment.  Respirations of 28, pt states he feels better( Late Entry).

## 2014-03-31 NOTE — Procedures (Signed)
Intubation Procedure Note Michael Keith [id number] 07/19/1950  Procedure: Intubation Indications: Respiratory insufficiency  Procedure Details Consent: Unable to obtain consent because of emergent medical necessity. Time Out: Verified patient identification, verified procedure, site/side was marked, verified correct patient position, special equipment/implants available, medications/allergies/relevent history reviewed, required imaging and test results available.  Performed  Maximum sterile technique was used including antiseptics, cap, gloves, hand hygiene and mask.  MAC    Evaluation Hemodynamic Status: BP stable throughout; O2 sats: stable throughout Patient's Current Condition: stable Complications: No apparent complications Patient did tolerate procedure well. Chest X-ray ordered to verify placement.  CXR: pending.   BABCOCK,PETE 2014-04-20  Supervised by me  Rigoberto Noel. mD

## 2014-03-31 NOTE — Progress Notes (Signed)
Asystole on monitor, no heart beat auscultated, no spontaneous respirations. Pronounced by Park Breed RN and Ericka Pontiff RN.

## 2014-03-31 NOTE — Progress Notes (Signed)
Responded to Code announcement along with other chaplains. No family is present and staff said that patient had asked them not to call family when he could still speak.  Chaplains are available to support family if they do come. Please call, [phone].  Epifania Gore, PhD, [place]

## 2014-03-31 NOTE — Progress Notes (Signed)
PULMONARY / CRITICAL CARE MEDICINE   Name: Michael Keith MRN: [id number] DOB: [birth year]    ADMISSION DATE:  03/24/2014 CONSULTATION DATE: 9/16  REFERRING MD :  [facility]  CHIEF COMPLAINT: SOB  INITIAL PRESENTATION: 63 yo AAM with known squamous cell esophogeal cancer, last chemo July 2015 and Rtx June 2015, just discharged from hospital 03/08/14. Noted to have MSSA bacteriemia with removal of port and placement of PICC. Discharged on cefazolin IV with planned stop date 03/31/14. He presents to [facility] ED 9/16 with acute SOB, negative PE protocol CT of chest but with new spiculaed mass vs nodular PNA. He had a metabolic acidosis with respiratory compensation (lactic acid 10.41). He has had antimicrobials changed to broad spectrum. He improved & was transferred out of ICU. reconsulted 9/21 for runs of VT  STUDIES:  9/16 2 d >> gd 2 diastolic dysfxn, EF <51%, diffuse hypokinesis. No veges   SIGNIFICANT EVENTS: 9/16 admit to ICU with metabolic acidosis.  9/17: PCCM signed off. Acidosis improved. ID following for abx 9/21: PCCM called back... Pt having runs of VT and wide complex tachycardia. Having increased SOB. Developed acute VT arrest. Followed by brief ROSC then ~ 20 minutes of PEA. Remained in shock s/p ROSC   SUBJECTIVE:  Not feeling well   VITAL SIGNS: Temp:  [97.5 F (36.4 C)-98.5 F (36.9 C)] 97.5 F (36.4 C) (09/21 0800) Pulse Rate:  [40-167] 91 (09/21 0800) Resp:  [19-44] 19 (09/21 0800) BP: (99-152)/(67-99) 121/83 mmHg (09/21 0800) SpO2:  [88 %-100 %] 88 % (09/21 0800) FiO2 (%):  [31 %] 31 % (09/21 0401) Weight:  [58.968 kg (130 lb)] 58.968 kg (130 lb) (09/21 0400) HEMODYNAMICS:   VENTILATOR SETTINGS: Vent Mode:  [-]  FiO2 (%):  [31 %] 31 % INTAKE / OUTPUT:  Intake/Output Summary (Last 24 hours) at 04-15-2014 1012 Last data filed at 2014/04/15 0700  Gross per 24 hour  Intake   1379 ml  Output   1078 ml  Net    301 ml    PHYSICAL EXAMINATION: General:  Frail wasted  AAM, awake and follows commands. NAD at rest. Prior to arrest   Neuro:  Now unresponsive.  HEENT:  Edentulous, no lan  Cardiovascular: HSR RRR Lungs:scattered rhonchi s/p cough  Abdomen:  Peg in place, soft + bs Musculoskeletal:  intact Skin:  Warm, co pain in feet, mild lower ext edema  LABS:  CBC  Recent Labs Lab 03/20/14 0430 04-15-2014 0030 04-15-14 0505  WBC 3.3* 3.5* 3.5*  HGB 8.3* 8.9* 8.7*  HCT 24.9* 26.9* 26.7*  PLT 21* 17* 16*   Coag's No results found for this basename: APTT, INR,  in the last 168 hours BMET  Recent Labs Lab 03/20/14 0430 04-15-2014 0030 04/15/2014 0505  NA 139 139 143  K 4.5 3.9 4.0  CL 100 102 104  CO2 25 24 23   BUN 51* 44* 47*  CREATININE 2.34* 1.97* 2.01*  GLUCOSE 190* 155* 155*   Electrolytes  Recent Labs Lab 03/18/14 0400  03/20/14 0430 04-15-2014 0030 04-15-14 0505  CALCIUM 8.4  < > 7.5* 8.6 8.8  MG 2.4  --   --   --  2.1  PHOS 3.0  --   --   --   --   < > = values in this interval not displayed. Sepsis Markers  Recent Labs Lab 03/09/2014 0712 03/17/14 0419 03/17/14 0912 03/18/14 0400 15-Apr-2014 0030  LATICACIDVEN  --   --  2.4* 1.2 2.0  PROCALCITON 1.82  3.96  --  3.83  --    ABG  Recent Labs Lab 03/11/2014 0332  PHART 7.411  PCO2ART 23.7*  PO2ART 214.0*   Liver Enzymes  Recent Labs Lab 03/20/14 0430 04-16-2014 0030 04/16/14 0505  AST 261* 191* 184*  ALT 28 27 25   ALKPHOS 160* 169* 166*  BILITOT 0.9 0.9 1.1  ALBUMIN 2.3* 2.5* 2.5*   Cardiac Enzymes  Recent Labs Lab 03/05/2014 0324  PROBNP 29933.0*   Glucose  Recent Labs Lab 03/20/14 0749 03/20/14 1130 03/20/14 1539 03/20/14 2005 April 16, 2014 0112 April 16, 2014 0446  GLUCAP 190* 185* 142* 191* 186* 179*    Imaging Dg Chest Port 1 View  03/20/2014   CLINICAL DATA:  Follow-up pneumonia  EXAM: PORTABLE CHEST - 1 VIEW  COMPARISON:  03/18/2014; 03/14/2014; 04/25/2010 chest CT - 03/06/2014; PET-CT - 12/21/2013  FINDINGS: Grossly unchanged enlarged cardiac  silhouette and mediastinal contours. Stable position of support apparatus. No change to slight worsening of ill-defined heterogeneous opacities within the peripheral aspect of the right mid and lower lung. Unchanged small bilateral effusions. Left basilar / retrocardiac opacities have minimally increased in the interval. Mild pulmonary venous congestion without frank evidence of edema. No pneumothorax. Unchanged bones.  IMPRESSION: 1. No change to slight worsening of ill-defined heterogeneous airspace opacities within the right mid and lower lung, possibly atelectasis though worrisome for residual infection. A follow-up chest radiograph in 4 to 6 weeks after treatment is recommended to ensure resolution. 2. Slight worsening of left basilar/retrocardiac opacities, likely atelectasis. 3. Unchanged small bilateral effusions.   Electronically Signed   By: Sandi Mariscal M.D.   On: 03/20/2014 10:24     ASSESSMENT / PLAN:  PULMONARY OETT NA A: New R focal PNA vs mass, ? Potential embolic process COPD Acute respiratory failure s/p cardiopulmonary arrest.  P:   Full vent support F/u abg and PCXR PAD protocol   CARDIOVASCULAR CVL rt picc 9/6>> A:  Decompensated systolic (EF <98%) and diastolic (gd II) HF.  ? SBE.Marland Kitchen TTE w/out obvious veges. Not candidate for TEE d/t esophageal CA Wide complex tachycardia vs VT VT arrest. Down time almost 30 min.  Cardiogenic shock  P:  amio gtt per cards Levophed for MAP > 65 Will not code again   RENAL A:   AKI: scr stable.  P:   Trend chemistry w/ diuresis   GASTROINTESTINAL A:   Esophogeal cancer PEG tube Chronic dysphagia  P:   Continue tube feeds NPO   HEMATOLOGIC A:   Known squamous cell esophogeal cancer post chemo/RTX New : possible spiculated 2.8 cm pleural-based mass within the right middle lobe vs infectious process Anemia  Severe thrombocytopenia: stable and felt d/t cefazolin  P:  Cont trend cbc  May need Biopsy depending on  resolution of RLL lesion with abx 9/17 transfusion per protocol   INFECTIOUS A:   Known staph aureus bacteremia on Cefazolin as outpatient: At risk for endocarditis PNA: klebsiella PNA + 9/16 (chronic aspiration) P:   vanc 9/16>>9/18 zoysn 9/16>> dapto 9/20 >>>10/2 (planned stop date)  ENDOCRINE A:  DM P:   SSI Per IM  NEUROLOGIC A:   Intact with RASS 0 P:   RASS goal: -2 No acute issues  TODAY'S SUMMARY: S/p vt arrest. Followed by PEA arrest. Suspect that this is primarily his decompensated heart failure w/ little else to offer here. He remains in shock. Have notified family. Doubt he will survive the day.   I spoke to his cousin Mr Glennon Mac who seems to  be HCPOA & reported the situation, bleak prognosis. After second arrest x 4 mins, made DNR  Care during the described time interval was provided by me and/or other providers on the critical care team.  I have reviewed this patient's available data, including medical history, events of note, physical examination and test results as part of my evaluation  CC time x 60 m  Rigoberto Noel. MD

## 2014-03-31 NOTE — Progress Notes (Signed)
PROGRESS NOTE    Michael Keith ALP:[id number] DOB: 1950-09-30 DOA: 03/03/2014 PCP: Philis Fendt, MD Oncology: Dr. Zola Button  This is a late entry. Patient had been seen at 7:45 AM on 2014/04/08.  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 Discharged on cefazolin IV with plan to stop on 03/31/14. He presented to the ED 9/16 with acute dyspnea, negative PE protocol CT of chest but with new spiculated mass versus modular pneumonia, metabolic acidosis with respiratory compensation (lactate 10.4). Critical care team admitted him to ICU and transferred care to hospitalist service on 03/18/14. Upon discharge 03/07/14: Hemoglobin 10.2, platelet 158 and creatinine 0.87. Has subsequently developed acute renal failure, severe anemia and thrombocytopenia. Oncology, nephrology & ID consulted.   Assessment/Plan:  1. New right middle lobe mass, focal pneumonia (concern for septic emboli)/HCAP -Klebsiella versus malignancy. Initially on IV vancomycin and Zosyn. We'll need followup imaging to ensure resolution. Mild hemoptysis has improved/resolved. Sputum culture shows Klebsiella. Blood cultures x2: Negative to date. ID input appreciated- continue Zosyn. DC Vancomycin- switching to Daptomycin 9/20. Urine Legionella & Strep Ag negative. Flu panel: neg. HIV: neg.  2. Acute on chronic systolic CHF: More dyspneic overnight. Received a dose of Lasix with good diuresis. Continue low dose Lasix. 3. NSSVT: Patient had sustained SVT overnight which did not break with IV metoprolol x2.  Subsequently got IV adenosine and converted to sinus rhythm. 4. NSVT: Patient had 2 episodes of nonsustained VT early this morning. Potassium 4. Magnesium 2. LVEF 15%. Blood pressure seems okay. Will start low dose carvedilol which he was on as outpatient. Cardiology consulted. 5. Acute respiratory failure with hypoxia: Secondary to lung mass/pneumonia and decompensated CHF requested CCM to reconsult 6. Severe Sepsis, present on admission: secondary to #1. Mx as above. Urine Cx, CDiff PCR negative. Sepsis features resolved 7. Acute renal failure, oliguric: Most likely from CIN Vs ATN. Vancomycin- DC'ed. Nephrology follow up appreciated. No history of NSAID/ACE/ARB use. Creatinine has plateaued. Reduce IVF. Foley catheter DC'd. Renal function slowly improving. 8. Severe anemia: Likely related to sepsis/anemia of critical illness. No overt bleeding. Status post PRBC 9/17 and hemoglobin improved from 6.3 > 8.6. Transfuse as needed to keep hemoglobin greater than 7 g per DL. Stable. 9. Severe thrombocytopenia: Likely secondary to critical illness, severe sepsis, medications (Ancef). Hematology consulted and have reviewed peripheral smear which did not show clear-cut schistocytes. Hematology feels that HIT less likely and no evidence to suggest DIC, TTP or HUS. Recommend continued supportive care, changed Ancef which may be contributing to thrombocytopenia, transfuse platelets to keep counts greater than 10 K. or for worsening bleeding. Improved after 2 units platelet transfusion on 9/18 but gradually dropping. Hold transfusion today. Platelets continue to drop. 10. Abnormal LFT's: AST and ALT were near normal 9/1. AST 1466 and ALT 120 on 9/17. Possibly shock liver. Improving. Hepatitis panel neg. Liver unremarkable on abdominal ultrasound 11. History of chronic systolic CHF/non-ischemic cardiomyopathy with last EF 15% and global LV hypokinesis/frequent PVCs and bigemini: A repeat echo 9/17 showed LVEF 15%,  diffuse LV hypokinesis, grade 2 diastolic dysfunction moderate to severe TR, severe pulmonary hypertension and  no mention about vegetations. TEE likely not possible due to his esophageal cancer. Some concern for SBE. Currently euvolemic or even on the dry side. Unable to use ACE inhibitor/ARB secondary to acute renal failure. K>4 & Mg >2 12. Recent MSSA bacteremia: Port-A-Cath removed. Was discharged on Ancef via PICC line but currently changed to vancomycin and Zosyn. Concern for SBE. TTE without vegetations. Unable to do TEE do to esophageal cancer. ID followup appreciated. Changing Abx to Daptomycin with stop date 04/01/2014  13. Anion gap metabolic acidosis/lactic acidosis with anion gap: ? Secondary to sepsis. Anion gap normalized.  14. Esophageal cancer: Oncology input appreciated. Outpatient followup. 15. Nutrition /severe protein calorie malnutrition: PEG tube feeding. 16. Type II DM with hypoglycemia: DC'ed NPH insulin and SSI. Tube feeding and monitor closely. No further hypoglycemic episodes. 80. Adult Failure to Thrive:  18. Cholelithiasis: Seen on ultrasound. No clinical features suggestive of acute cholecystitis.    Code Status:  Full Family Communication: Discussed with patients cousin (pt consented) Mr [name] on 9/18- updated care and answered questions. Disposition Plan: Remains in step down unit.   Consultants:  Critical care medicine-signed off 9/17  Hematology/oncology  Nephrology  Infectious Disease  Procedures:  Right upper extremity PICC-placed on previous admission  Foley catheter-DC'd 9/20  Antibiotics:  IV Zosyn 9/15 >  IV vancomycin 9/15 >9/19   IV daptomycin 9/20 >  Subjective: Overnight events noted. Complaints of dyspnea. Bloated abdominal sensation. Denies abdominal pain. Had watery BM. No tube residuals. No chest pain. Cough but denies hemoptysis.  Objective: Filed Vitals:   04/06/2014 1000 Apr 06, 2014 1035 April 06, 2014 1100 April 06, 2014 1134    BP: 120/97  255/142   Pulse: 91 157 131   Temp:      TempSrc:      Resp: 18 37 35   Height:      Weight:    58.9 kg (129 lb 13.6 oz)  SpO2: 90% 88% 49%     Intake/Output Summary (Last 24 hours) at 04-06-14 1250 Last data filed at 06-Apr-2014 0900  Gross per 24 hour  Intake   1230 ml  Output   1078 ml  Net    152 ml   Filed Weights   03/20/14 0500 04-06-14 0400 06-Apr-2014 1134  Weight: 58.469 kg (128 lb 14.4 oz) 58.968 kg (130 lb) 58.9 kg (129 lb 13.6 oz)     Exam:  General exam:  pleasant middle-aged moderately built, cachectic chronically ill-looking male, lying propped up in bed with mild respiratory distress Respiratory system:  Diminished breath sounds bilaterally with scattered bilateral crackles. Mild increased work of breathing. Cardiovascular system: S1 & S2 heard, RRR. No JVD, murmurs, gallops, clicks or pedal edema. Telemetry: Sinus rhythm with frequent PVCs & bigemini. 2 runs of NSVT. Gastrointestinal system: Abdomen is nondistended, soft and nontender. Normal bowel sounds heard. PEG tube site intact.  Central nervous system: Alert and oriented. No focal neurological deficits. Extremities: Symmetric 5 x 5 power. Right upper extremity PICC site without acute findings  Skin: Dry and scaly.    Data Reviewed: Basic Metabolic Panel:  Recent Labs Lab 03/18/14 0400 03/19/14 0603 03/20/14 0430 2014/04/06 0030 04-06-14 0505  NA 139 140 139 139 143  K 3.7 3.7 4.5 3.9 4.0  CL 100 101 100 102 104  CO2 26 24 25 24 23   GLUCOSE 78 71 190* 155* 155*  BUN 62* 55* 51* 44* 47*  CREATININE 2.44* 2.49* 2.34* 1.97* 2.01*  CALCIUM 8.4 8.5 7.5* 8.6 8.8  MG 2.4  --   --   --  2.1  PHOS 3.0  --   --   --   --    Liver Function Tests:  Recent Labs Lab 03/18/14 1410 03/19/14 0603 03/20/14 0430 2014/04/15 0030 04/15/2014 0505  AST 581* 403* 261* 191* 184*  ALT 41 35 28 27 25   ALKPHOS 150* 162* 160* 169* 166*  BILITOT 1.3* 1.2 0.9 0.9 1.1  PROT 6.3 6.4 6.2 6.8 6.7  ALBUMIN  2.4* 2.4* 2.3* 2.5* 2.5*   No results found for this basename: LIPASE, AMYLASE,  in the last 168 hours No results found for this basename: AMMONIA,  in the last 168 hours CBC:  Recent Labs Lab 03/19/2014 0324 03/24/2014 0712  03/18/14 2014 03/19/14 0603 03/20/14 0430 2014-04-15 0030 2014/04/15 0505  WBC 7.6 7.1  < > 4.0 3.6* 3.3* 3.5* 3.5*  NEUTROABS 6.4 6.2  --   --   --   --   --   --   HGB 7.5* 7.1*  < > 8.4* 8.5* 8.3* 8.9* 8.7*  HCT 22.6* 21.4*  < > 24.6* 24.8* 24.9* 26.9* 26.7*  MCV 91.1 92.2  < > 88.5 89.2 91.2 90.9 93.0  PLT 20* 19*  < > 68* 41* 21* 17* 16*  < > = values in this interval not displayed. Cardiac Enzymes:  Recent Labs Lab 03/20/14 0430  CKTOTAL 28   BNP (last 3 results)  Recent Labs  02/28/14 1636 03/01/2014 0324  PROBNP 29729.0* 29933.0*   CBG:  Recent Labs Lab 03/20/14 1130 03/20/14 1539 03/20/14 2005 2014/04/15 0112 2014/04/15 0446  GLUCAP 185* 142* 191* 186* 179*    Recent Results (from the past 240 hour(s))  CULTURE, BLOOD (ROUTINE X 2)     Status: None   Collection Time    03/12/2014  3:24 AM      Result Value Ref Range Status   Specimen Description BLOOD RIGHT UPPER ARM   Final   Special Requests BOTTLES DRAWN AEROBIC AND ANAEROBIC 5CC   Final   Culture  Setup Time     Final   Value: 03/27/2014 08:27     Performed at [facility]   Culture     Final   Value:        BLOOD CULTURE RECEIVED NO GROWTH TO DATE CULTURE WILL BE HELD FOR 5 DAYS BEFORE ISSUING A FINAL NEGATIVE REPORT     Performed at [facility]   Report Status PENDING   Incomplete  CULTURE, BLOOD (ROUTINE X 2)     Status: None   Collection Time    03/19/2014  5:15 AM      Result Value Ref Range Status   Specimen Description BLOOD LEFT FOREARM   Final   Special Requests BOTTLES DRAWN AEROBIC ONLY 2ML   Final   Culture  Setup Time     Final   Value: 03/05/2014 08:29     Performed at [facility]   Culture     Final   Value:        BLOOD CULTURE RECEIVED  NO GROWTH TO DATE CULTURE WILL BE HELD FOR 5 DAYS BEFORE ISSUING A FINAL NEGATIVE REPORT     Note: Culture results may be compromised due to an inadequate volume of blood received in culture bottles.     Performed at [facility]   Report Status PENDING   Incomplete  URINE CULTURE     Status: None   Collection Time    03/28/2014  6:29 AM  Result Value Ref Range Status   Specimen Description URINE, CATHETERIZED   Final   Special Requests NONE   Final   Culture  Setup Time     Final   Value: 03/15/2014 11:58     Performed at [place]     Final   Value: NO GROWTH     Performed at [facility]   Culture     Final   Value: NO GROWTH     Performed at [facility]   Report Status 03/17/2014 FINAL   Final  CLOSTRIDIUM DIFFICILE BY PCR     Status: None   Collection Time    03/20/2014  8:47 AM      Result Value Ref Range Status   C difficile by pcr NEGATIVE  NEGATIVE Final   Comment: Performed at [place], EXPECTORATED SPUTUM-ASSESSMENT     Status: None   Collection Time    03/04/2014  8:24 PM      Result Value Ref Range Status   Specimen Description SPUTUM   Final   Special Requests NONE   Final   Sputum evaluation     Final   Value: MICROSCOPIC FINDINGS SUGGEST THAT THIS SPECIMEN IS NOT REPRESENTATIVE OF LOWER RESPIRATORY SECRETIONS. PLEASE RECOLLECT.     CALLED TO YOUNG,S/2W @2100  ON 03/28/2014 BY KARCZEWSKI,S.   Report Status 03/15/2014 FINAL   Final  CULTURE, EXPECTORATED SPUTUM-ASSESSMENT     Status: None   Collection Time    03/17/14 11:16 AM      Result Value Ref Range Status   Specimen Description SPUTUM   Final   Special Requests Immunocompromised   Final   Sputum evaluation     Final   Value: THIS SPECIMEN IS ACCEPTABLE. RESPIRATORY CULTURE REPORT TO FOLLOW.   Report Status 03/17/2014 FINAL   Final  CULTURE, RESPIRATORY (NON-EXPECTORATED)     Status: None   Collection Time    03/17/14 11:16 AM       Result Value Ref Range Status   Specimen Description SPUTUM   Final   Special Requests NONE   Final   Gram Stain     Final   Value: FEW WBC PRESENT,BOTH PMN AND MONONUCLEAR     FEW SQUAMOUS EPITHELIAL CELLS PRESENT     NO ORGANISMS SEEN     Performed at [facility]   Culture     Final   Value: RARE KLEBSIELLA PNEUMONIAE     Performed at [facility]   Report Status 03/19/2014 FINAL   Final   Organism ID, Bacteria KLEBSIELLA PNEUMONIAE   Final         Studies: Dg Chest Port 1 View  2014/03/29   CLINICAL DATA:  Intubation, COPD a  EXAM: PORTABLE CHEST - 1 VIEW  COMPARISON:  2014/03/29  FINDINGS: Endotracheal tube with the tip 6.3 cm above the carina.  Right-sided PICC line with the tip projecting over cavoatrial junction.  There are bilateral small pleural effusions. There is bilateral diffuse interstitial thickening. There is no pneumothorax. The patient is rotated towards the right. Stable cardiomediastinal silhouette. Unremarkable osseous structures.  IMPRESSION: 1. Endotracheal tube with the tip 6.3 cm above the carina. 2. Bilateral diffuse interstitial thickening and small pleural effusions. Differential diagnosis includes multilobar pneumonia versus pulmonary edema versus ARDS.   Electronically Signed   By: Kathreen Devoid   On: 03/29/14 11:32   Dg Chest Port 1 View  March 29, 2014  CLINICAL DATA:  Increasing shortness of breath  EXAM: PORTABLE CHEST - 1 VIEW  COMPARISON:  03/20/2014  FINDINGS: Right PICC line unchanged. Moderate cardiac enlargement stable. Hazy density at both lung bases with small pleural effusions. No change when compared to prior study.  IMPRESSION: Stable persistent bibasilar infiltrates with small effusions.   Electronically Signed   By: Skipper Cliche M.D.   On: 2014/03/23 00:42   Dg Chest Port 1 View  03/20/2014   CLINICAL DATA:  Follow-up pneumonia  EXAM: PORTABLE CHEST - 1 VIEW  COMPARISON:  03/18/2014; 03/28/2014; 04/25/2010 chest CT -  03/01/2014; PET-CT - 12/21/2013  FINDINGS: Grossly unchanged enlarged cardiac silhouette and mediastinal contours. Stable position of support apparatus. No change to slight worsening of ill-defined heterogeneous opacities within the peripheral aspect of the right mid and lower lung. Unchanged small bilateral effusions. Left basilar / retrocardiac opacities have minimally increased in the interval. Mild pulmonary venous congestion without frank evidence of edema. No pneumothorax. Unchanged bones.  IMPRESSION: 1. No change to slight worsening of ill-defined heterogeneous airspace opacities within the right mid and lower lung, possibly atelectasis though worrisome for residual infection. A follow-up chest radiograph in 4 to 6 weeks after treatment is recommended to ensure resolution. 2. Slight worsening of left basilar/retrocardiac opacities, likely atelectasis. 3. Unchanged small bilateral effusions.   Electronically Signed   By: Sandi Mariscal M.D.   On: 03/20/2014 10:24   Dg Abd Portable 1v  Mar 23, 2014   CLINICAL DATA:  Abdominal distension.  EXAM: PORTABLE ABDOMEN - 1 VIEW  COMPARISON:  02/04/2014.  FINDINGS: Percutaneous feeding tube is seen in the left upper quadrant. There is scattered gas within nondistended small bowel and minimally within the colon and rectum. Image quality is somewhat degraded by motion.  IMPRESSION: Unremarkable bowel gas pattern.   Electronically Signed   By: Lorin Picket M.D.   On: 23-Mar-2014 08:30        Scheduled Meds: . amiodarone  150 mg Intravenous Once  . antiseptic oral rinse  7 mL Mouth Rinse q12n4p  . carvedilol  3.125 mg Per Tube BID WC  . chlorhexidine  15 mL Mouth Rinse BID  . DAPTOmycin (CUBICIN)  IV  450 mg Intravenous Q48H  . pantoprazole (PROTONIX) IV  40 mg Intravenous Q24H  . piperacillin-tazobactam (ZOSYN)  IV  3.375 g Intravenous 3 times per day  . sodium chloride  10-40 mL Intracatheter Q12H  . sterile water for irrigation  360 mL Irrigation 3 times  per day  . sucralfate  1 g Per Tube TID   Continuous Infusions: . amiodarone     Followed by  . amiodarone      Principal Problem:   Severe sepsis Active Problems:   Nonischemic cardiomyopathy   Chronic systolic CHF (congestive heart failure)   Squamous cell esophageal cancer   Severe protein-calorie malnutrition   Type II or unspecified type diabetes mellitus with renal manifestations, not stated as uncontrolled   Acute renal failure   Anemia, unspecified   Thrombocytopenia, unspecified   Acute hepatitis    Time spent: 45 minutes.    Vernell Leep, MD, FACP, FHM. [facility] Pager [phone]  If 7PM-7AM, please contact night-coverage [email or web address] Password [email or web address] 23-Mar-2014, 12:50 PM    LOS: 5 days

## 2014-03-31 NOTE — Code Documentation (Addendum)
PATIENT NAME: Michael Keith RECORD NUMBER: [id number] Birthday: 07-Apr-1951  Age: 63 y.o. Admit Date: 03/14/2014  Provider: babcock/Emberlie Gotcher  Indication: VT arrest f/b PEA  Technical Description:   CPR performance duration: about 20 minutes  Was defibrillation or cardioversion used ? yes  Was external pacer placed ? no  Was patient intubated pre/post CPR ? yes  Was transvenous pacer placed ? no  Medications Administered Include      Yes/no Amiodarone yes  Atropin   Calcium   Epinephrine Yes x 3  Lidocaine   Magnesium   Norepinephrine Yes 76mcg/min   Phenylephrine   Sodium bicarbonate yes  Vasopression    Evaluation  Final Status - Was patient successfully resuscitated ? yes  If successfully resuscitated - what is current rhythm ?  SR w/ PACs If successfully resuscitated - what is current hemodynamic status ? Cardiogenic shock  Miscellaneous Information Family notified. Made pt DNR if arrests again.   BABCOCK,PETE 10/10/201511:16 AM  Rigoberto Noel MD

## 2014-03-31 NOTE — Progress Notes (Signed)
PT Cancellation Note  Patient Details Name: Michael Keith MRN: [id number] DOB: 13-May-1951   Cancelled Treatment:    Reason Eval/Treat Not Completed: Medical issues which prohibited therapy--code blue called-pt now intubated. Will hold PT and check back another day. Thanks.    Weston Anna, MPT Pager: [phone]

## 2014-03-31 NOTE — Consult Note (Signed)
Reason for Consult:  NSVT/Torsades Referring Physician:   EDITH LORD is an 63 y.o. male.  HPI:   The patient is a 63 yo AA male chronic CHF, nonischemic CM, DM, MR, COPD, ETOH use, esophageal cancer-SP chemo, MR, moderate TR, Tobacco abuse, chronic pancreatitis.  He has been unreliable for follow up.  His EF in 03/2012 was 35-40% and on 03/01/14 it was 15%.  He had a mild troponin elevation during adm(for MSSA bacteremia) in early Sept which was not thought to be ischemic mediated.  At that time he was having episodes of NSVT.   He was not on any BB, ace or ARB due to hypotension. He was admitted on 9/16 with SOB, sepsis, new right middle lobe mass. This morning at 0948hrs his rhythm converted to VT, possibly torsades.  He spontaneously converted and later was coded because ot VT then again for PEA.    Past Medical History  Diagnosis Date  . Chronic pancreatitis     CT scan shows improving pancreatitis  . Nonischemic cardiomyopathy 09/2009    Catheterization, April, 2011, questionable occlusion of the most apical portion of the LAD, LV dysfunction out of proportion to coronary disease  . Chronic systolic CHF (congestive heart failure)     April, 2011 mild troponin elevation at that time  . Mitral regurgitation     mild, echo April 2011  . Tobacco abuse   . Alcohol use     heavy until Jan 2011, patient was counseled concerning alcohol in April 2011  . COPD (chronic obstructive pulmonary disease)   . Fluid overload 03/2010    October, 2011  . Cough Jan 2012    may be from enalapril, January, 2012  . Ejection fraction < 50%     EF 35-40%, echo, April, 2011  . CAD (coronary artery disease)     Catheterization, April, 2011, questionable occlusion of the most apical portion of the LAD, LV dysfunction out of proportion to coronary disease  . Squamous cell esophageal cancer   . Severe protein-calorie malnutrition 01/31/2014    Past Surgical History  Procedure Laterality Date  . Cardiac  catheterization  April 2011    questionable occlusion of the most apical portion of the LAD / LV dysfunction out of proportion to coronary disease  . Wrist surgey      left wrist  . Laparoscopic gastrostomy N/A 02/02/2014    Procedure: LAPAROSCOPIC GASTROSTOMY TUBE PLACEMENT;  Surgeon: Pedro Earls, MD;  Location: [facility];  Service: General;  Laterality: N/A;    Family History  Problem Relation Age of Onset  . Diabetes Cousin   . Cancer Mother     Social History:  reports that he quit smoking about 2 months ago. His smoking use included Cigarettes. He has a 15 pack-year smoking history. He has never used smokeless tobacco. He reports that he does not drink alcohol or use illicit drugs.  Allergies: No Known Allergies  Medications:  Scheduled Meds: . amiodarone  150 mg Intravenous Once  . antiseptic oral rinse  7 mL Mouth Rinse q12n4p  . carvedilol  3.125 mg Per Tube BID WC  . chlorhexidine  15 mL Mouth Rinse BID  . DAPTOmycin (CUBICIN)  IV  450 mg Intravenous Q48H  . pantoprazole (PROTONIX) IV  40 mg Intravenous Q24H  . piperacillin-tazobactam (ZOSYN)  IV  3.375 g Intravenous 3 times per day  . sodium chloride  10-40 mL Intracatheter Q12H  . sterile water for irrigation  360 mL Irrigation 3 times per day  . sucralfate  1 g Per Tube TID   Continuous Infusions: . amiodarone     Followed by  . amiodarone     PRN Meds:.acetaminophen, acetaminophen, fentaNYL, fentaNYL, levalbuterol   Results for orders placed during the hospital encounter of 03/04/2014 (from the past 48 hour(s))  GLUCOSE, CAPILLARY     Status: Abnormal   Collection Time    03/19/14 12:45 PM      Result Value Ref Range   Glucose-Capillary 115 (*) 70 - 99 mg/dL   Comment 1 Documented in Chart     Comment 2 Notify RN    GLUCOSE, CAPILLARY     Status: Abnormal   Collection Time    03/19/14  3:46 PM      Result Value Ref Range   Glucose-Capillary 197 (*) 70 - 99 mg/dL   Comment 1 Documented in Chart      Comment 2 Notify RN    GLUCOSE, CAPILLARY     Status: Abnormal   Collection Time    03/19/14  8:44 PM      Result Value Ref Range   Glucose-Capillary 188 (*) 70 - 99 mg/dL  GLUCOSE, CAPILLARY     Status: Abnormal   Collection Time    03/20/14  2:07 AM      Result Value Ref Range   Glucose-Capillary 176 (*) 70 - 99 mg/dL  CBC     Status: Abnormal   Collection Time    03/20/14  4:30 AM      Result Value Ref Range   WBC 3.3 (*) 4.0 - 10.5 K/uL   Comment: RARE NRBCs   RBC 2.73 (*) 4.22 - 5.81 MIL/uL   Hemoglobin 8.3 (*) 13.0 - 17.0 g/dL   HCT 24.9 (*) 39.0 - 52.0 %   MCV 91.2  78.0 - 100.0 fL   MCH 30.4  26.0 - 34.0 pg   MCHC 33.3  30.0 - 36.0 g/dL   RDW 18.3 (*) 11.5 - 15.5 %   Platelets 21 (*) 150 - 400 K/uL   Comment: SPECIMEN CHECKED FOR CLOTS     REPEATED TO VERIFY     DELTA CHECK NOTED     PLATELET COUNT CONFIRMED BY SMEAR     CRITICAL RESULT CALLED TO, READ BACK BY AND VERIFIED WITH:     C AYERS AT 0531 ON 09.20.2015 BY NBROOKS  COMPREHENSIVE METABOLIC PANEL     Status: Abnormal   Collection Time    03/20/14  4:30 AM      Result Value Ref Range   Sodium 139  137 - 147 mEq/L   Potassium 4.5  3.7 - 5.3 mEq/L   Comment: DELTA CHECK NOTED     NO VISIBLE HEMOLYSIS   Chloride 100  96 - 112 mEq/L   CO2 25  19 - 32 mEq/L   Glucose, Bld 190 (*) 70 - 99 mg/dL   BUN 51 (*) 6 - 23 mg/dL   Creatinine, Ser 2.34 (*) 0.50 - 1.35 mg/dL   Calcium 7.5 (*) 8.4 - 10.5 mg/dL   Total Protein 6.2  6.0 - 8.3 g/dL   Albumin 2.3 (*) 3.5 - 5.2 g/dL   AST 261 (*) 0 - 37 U/L   ALT 28  0 - 53 U/L   Alkaline Phosphatase 160 (*) 39 - 117 U/L   Total Bilirubin 0.9  0.3 - 1.2 mg/dL   GFR calc non Af Amer 28 (*) >90 mL/min  GFR calc Af Amer 32 (*) >90 mL/min   Comment: (NOTE)     The eGFR has been calculated using the CKD EPI equation.     This calculation has not been validated in all clinical situations.     eGFR's persistently <90 mL/min signify possible Chronic Kidney     Disease.    Anion gap 14  5 - 15  CK     Status: None   Collection Time    03/20/14  4:30 AM      Result Value Ref Range   Total CK 28  7 - 232 U/L  GLUCOSE, CAPILLARY     Status: Abnormal   Collection Time    03/20/14  4:32 AM      Result Value Ref Range   Glucose-Capillary 178 (*) 70 - 99 mg/dL  GLUCOSE, CAPILLARY     Status: Abnormal   Collection Time    03/20/14  7:49 AM      Result Value Ref Range   Glucose-Capillary 190 (*) 70 - 99 mg/dL   Comment 1 Documented in Chart     Comment 2 Notify RN    GLUCOSE, CAPILLARY     Status: Abnormal   Collection Time    03/20/14 11:30 AM      Result Value Ref Range   Glucose-Capillary 185 (*) 70 - 99 mg/dL   Comment 1 Documented in Chart     Comment 2 Notify RN    GLUCOSE, CAPILLARY     Status: Abnormal   Collection Time    03/20/14  3:39 PM      Result Value Ref Range   Glucose-Capillary 142 (*) 70 - 99 mg/dL   Comment 1 Documented in Chart     Comment 2 Notify RN    GLUCOSE, CAPILLARY     Status: Abnormal   Collection Time    03/20/14  8:05 PM      Result Value Ref Range   Glucose-Capillary 191 (*) 70 - 99 mg/dL  CBC     Status: Abnormal   Collection Time    03/30/2014 12:30 AM      Result Value Ref Range   WBC 3.5 (*) 4.0 - 10.5 K/uL   Comment: RARE NRBCs   RBC 2.96 (*) 4.22 - 5.81 MIL/uL   Hemoglobin 8.9 (*) 13.0 - 17.0 g/dL   HCT 26.9 (*) 39.0 - 52.0 %   MCV 90.9  78.0 - 100.0 fL   MCH 30.1  26.0 - 34.0 pg   MCHC 33.1  30.0 - 36.0 g/dL   RDW 18.8 (*) 11.5 - 15.5 %   Platelets 17 (*) 150 - 400 K/uL   Comment: REPEATED TO VERIFY     CRITICAL VALUE NOTED.  VALUE IS CONSISTENT WITH PREVIOUSLY REPORTED AND CALLED VALUE.  COMPREHENSIVE METABOLIC PANEL     Status: Abnormal   Collection Time    03-30-2014 12:30 AM      Result Value Ref Range   Sodium 139  137 - 147 mEq/L   Potassium 3.9  3.7 - 5.3 mEq/L   Chloride 102  96 - 112 mEq/L   CO2 24  19 - 32 mEq/L   Glucose, Bld 155 (*) 70 - 99 mg/dL   BUN 44 (*) 6 - 23 mg/dL    Creatinine, Ser 1.97 (*) 0.50 - 1.35 mg/dL   Calcium 8.6  8.4 - 10.5 mg/dL   Total Protein 6.8  6.0 - 8.3 g/dL   Albumin 2.5 (*)  3.5 - 5.2 g/dL   AST 191 (*) 0 - 37 U/L   ALT 27  0 - 53 U/L   Alkaline Phosphatase 169 (*) 39 - 117 U/L   Total Bilirubin 0.9  0.3 - 1.2 mg/dL   GFR calc non Af Amer 34 (*) >90 mL/min   GFR calc Af Amer 40 (*) >90 mL/min   Comment: (NOTE)     The eGFR has been calculated using the CKD EPI equation.     This calculation has not been validated in all clinical situations.     eGFR's persistently <90 mL/min signify possible Chronic Kidney     Disease.   Anion gap 13  5 - 15  LACTIC ACID, PLASMA     Status: None   Collection Time    04/01/2014 12:30 AM      Result Value Ref Range   Lactic Acid, Venous 2.0  0.5 - 2.2 mmol/L  TSH     Status: None   Collection Time    01-Apr-2014 12:30 AM      Result Value Ref Range   TSH 2.830  0.350 - 4.500 uIU/mL   Comment: Performed at [place], CAPILLARY     Status: Abnormal   Collection Time    04-01-2014  1:12 AM      Result Value Ref Range   Glucose-Capillary 186 (*) 70 - 99 mg/dL  GLUCOSE, CAPILLARY     Status: Abnormal   Collection Time    04/01/2014  4:46 AM      Result Value Ref Range   Glucose-Capillary 179 (*) 70 - 99 mg/dL  CBC     Status: Abnormal   Collection Time    Apr 01, 2014  5:05 AM      Result Value Ref Range   WBC 3.5 (*) 4.0 - 10.5 K/uL   Comment: WHITE COUNT CONFIRMED ON SMEAR     RARE NRBCs   RBC 2.87 (*) 4.22 - 5.81 MIL/uL   Hemoglobin 8.7 (*) 13.0 - 17.0 g/dL   HCT 26.7 (*) 39.0 - 52.0 %   MCV 93.0  78.0 - 100.0 fL   MCH 30.3  26.0 - 34.0 pg   MCHC 32.6  30.0 - 36.0 g/dL   RDW 18.9 (*) 11.5 - 15.5 %   Platelets 16 (*) 150 - 400 K/uL   Comment: REPEATED TO VERIFY     CRITICAL VALUE NOTED.  VALUE IS CONSISTENT WITH PREVIOUSLY REPORTED AND CALLED VALUE.  COMPREHENSIVE METABOLIC PANEL     Status: Abnormal   Collection Time    Apr 01, 2014  5:05 AM      Result Value Ref Range    Sodium 143  137 - 147 mEq/L   Potassium 4.0  3.7 - 5.3 mEq/L   Chloride 104  96 - 112 mEq/L   CO2 23  19 - 32 mEq/L   Glucose, Bld 155 (*) 70 - 99 mg/dL   BUN 47 (*) 6 - 23 mg/dL   Creatinine, Ser 2.01 (*) 0.50 - 1.35 mg/dL   Calcium 8.8  8.4 - 10.5 mg/dL   Total Protein 6.7  6.0 - 8.3 g/dL   Albumin 2.5 (*) 3.5 - 5.2 g/dL   AST 184 (*) 0 - 37 U/L   ALT 25  0 - 53 U/L   Alkaline Phosphatase 166 (*) 39 - 117 U/L   Total Bilirubin 1.1  0.3 - 1.2 mg/dL   GFR calc non Af Amer 33 (*) >90 mL/min  GFR calc Af Amer 39 (*) >90 mL/min   Comment: (NOTE)     The eGFR has been calculated using the CKD EPI equation.     This calculation has not been validated in all clinical situations.     eGFR's persistently <90 mL/min signify possible Chronic Kidney     Disease.   Anion gap 16 (*) 5 - 15  SAVE SMEAR     Status: None   Collection Time    April 10, 2014  5:05 AM      Result Value Ref Range   Smear Review SMEAR STAINED AND AVAILABLE FOR REVIEW    MAGNESIUM     Status: None   Collection Time    04/10/14  5:05 AM      Result Value Ref Range   Magnesium 2.1  1.5 - 2.5 mg/dL    Dg Chest Port 1 View  Apr 10, 2014   CLINICAL DATA:  Increasing shortness of breath  EXAM: PORTABLE CHEST - 1 VIEW  COMPARISON:  03/20/2014  FINDINGS: Right PICC line unchanged. Moderate cardiac enlargement stable. Hazy density at both lung bases with small pleural effusions. No change when compared to prior study.  IMPRESSION: Stable persistent bibasilar infiltrates with small effusions.   Electronically Signed   By: Skipper Cliche M.D.   On: 04/10/2014 00:42   Dg Chest Port 1 View  03/20/2014   CLINICAL DATA:  Follow-up pneumonia  EXAM: PORTABLE CHEST - 1 VIEW  COMPARISON:  03/18/2014; 03/28/2014; 04/25/2010 chest CT - 03/22/2014; PET-CT - 12/21/2013  FINDINGS: Grossly unchanged enlarged cardiac silhouette and mediastinal contours. Stable position of support apparatus. No change to slight worsening of ill-defined heterogeneous  opacities within the peripheral aspect of the right mid and lower lung. Unchanged small bilateral effusions. Left basilar / retrocardiac opacities have minimally increased in the interval. Mild pulmonary venous congestion without frank evidence of edema. No pneumothorax. Unchanged bones.  IMPRESSION: 1. No change to slight worsening of ill-defined heterogeneous airspace opacities within the right mid and lower lung, possibly atelectasis though worrisome for residual infection. A follow-up chest radiograph in 4 to 6 weeks after treatment is recommended to ensure resolution. 2. Slight worsening of left basilar/retrocardiac opacities, likely atelectasis. 3. Unchanged small bilateral effusions.   Electronically Signed   By: Sandi Mariscal M.D.   On: 03/20/2014 10:24   Dg Abd Portable 1v  10-Apr-2014   CLINICAL DATA:  Abdominal distension.  EXAM: PORTABLE ABDOMEN - 1 VIEW  COMPARISON:  02/04/2014.  FINDINGS: Percutaneous feeding tube is seen in the left upper quadrant. There is scattered gas within nondistended small bowel and minimally within the colon and rectum. Image quality is somewhat degraded by motion.  IMPRESSION: Unremarkable bowel gas pattern.   Electronically Signed   By: Lorin Picket M.D.   On: 04/10/14 08:30    Review of Systems  Unable to perform ROS: intubated   Blood pressure 121/83, pulse 91, temperature 97.5 F (36.4 C), temperature source Oral, resp. rate 19, height 5' 6"  (1.676 m), weight 130 lb (58.968 kg), SpO2 88.00%. Physical Exam  Constitutional: He appears well-developed.  Intubated  HENT:  Head: Normocephalic and atraumatic.  Eyes: No scleral icterus.  Pupils nonreactive  Cardiovascular: Normal rate and regular rhythm.   Pulses:      Radial pulses are 1+ on the right side, and 1+ on the left side.       Dorsalis pedis pulses are 2+ on the right side, and 2+ on the left side.  Heart sounds  are hard to hear over the coarse BS.   Respiratory:  On vent.  BS very coarse.     GI: Bowel sounds are normal. He exhibits distension.  Neurological:  Intubated. Not alert.  Skin: Skin is warm and dry.  Psychiatric:  Intubated    Assessment/Plan: Active Problems:   Severe sepsis   Nonischemic cardiomyopathy  EF now 15% as of 03/01/14.  Appears euvolemic.    VT Spontaneously converted the first time and was coded twice: first for VT then PEA.  EKG prior to arrest showed a QTc of 422m with mag of 2.1 and a K of 4.0.  Amio bolus and drip started.  Levophed started.   Continue pressors.  Watch volume.  Now DNR.  POst arrest EKG: sinus brady 52, anteroseptal TWI unchaged from prio.  QTc 4833m  Prognosis is poor.    Chronic systolic CHF (congestive heart failure)   Squamous cell esophageal cancer   Severe protein-calorie malnutrition   Type II or unspecified type diabetes mellitus with renal manifestations, not stated as uncontrolled   Acute renal failure   Anemia, unspecified   Thrombocytopenia, unspecified   Acute hepatitis  HAGER, BRYAN, PAC 03/2014-03-1209:32 AM  Patient seen and examined. I agree with the assessment and plan as detailed above. See also my additional thoughts below.   At the time that I am now dictating, the patient has expired. I did see him today while he was still live. I know him from the office. Unfortunately he had a multitude of medical problems. He was treated aggressively today. He has expired from his severe cardiovascular disease.  JeDola ArgyleMD, FATex[facility]/12-Oct-20152:09 PM

## 2014-03-31 NOTE — Discharge Summary (Signed)
Death Summary  Michael Keith AQT:[id number] DOB: 1951/01/24 DOA: 04/14/14  PCP: Philis Fendt, MD   Admit date: April 14, 2014 Date of Death: 04-19-2014  Final Diagnoses:  Principal Problem:   Severe sepsis Active Problems:   Nonischemic cardiomyopathy   Chronic systolic CHF (congestive heart failure)   Squamous cell esophageal cancer   Severe protein-calorie malnutrition   Type II or unspecified type diabetes mellitus with renal manifestations, not stated as uncontrolled   Acute renal failure   Anemia, unspecified   Thrombocytopenia, unspecified   Acute hepatitis      History of present illness:  63 year old male patient with 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 Discharged on cefazolin IV with plan to stop on 03/31/14. He presented to the ED 15-Apr-2023 with acute dyspnea, negative PE protocol CT of chest but with new spiculated mass versus modular pneumonia, metabolic acidosis with respiratory compensation (lactate 10.4). Critical care team admitted him to ICU and transferred care to hospitalist service on 03/18/14. Upon discharge 03/07/14: Hemoglobin 10.2, platelet 158 and creatinine 0.87. Has subsequently developed acute renal failure, severe anemia and thrombocytopenia. Oncology, nephrology & ID consulted.   Hospital Course:  Patient remained in the step down unit through the course of his hospitalization. He received broad-spectrum antibiotic treatment for lung mass which was suspicious for healthcare associated pneumonia versus malignancy. Infectious disease consulted and were planning to narrow  antibiotics for this indication. His severe sepsis had improved. He developed acute renal failure which was multifactorial but most likely from contrast-induced nephropathy versus acute tubular necrosis. Nephrology consulted. Creatinine was starting to improve. He was severely anemic and thrombocytopenic which was felt to be secondary to sepsis and Ancef which was discontinued. He was transfused PRBC and platelets with some improvement. His hepatitic picture was attributed to shock liver and LFTs were improving. MSSA bacteremia diagnosed during previous admission was planned to be treated with daptomycin. He had ongoing tube feeds. On the night of 9/20, he he developed sustained SVT which responded to IV adenosine. On the morning of 2023/04/20, he developed 2 episodes of nonsustained VT. His potassium and magnesium levels were normal. Carvedilol was initiated. Cardiology and pulmonary critical care were consulted. Shortly thereafter, he went into VT arrest followed by PEA. He was transiently resuscitated. Critical care M.D. discussed with family and patient was transitioned to DO NOT RESUSCITATE. Patient eventually demised. He was pronounced dead by nursing at 11:34 AM. Discussed with patient's cousin Mr. [name] at bedside.   Time: 30 minutes  Signed:  Yamen Castrogiovanni  [facility] 04/19/2014, 12:51 PM

## 2014-03-31 NOTE — Progress Notes (Signed)
Event: Notified by RN that pt's HR in 160's. Pt denies c/o CP. 12-lead EKG reveals ST w/ rate of 160. Rate has been refractory to Metoprolol 5 mg IV x 2. Stat PCXR, CBC, CMP, lactic acid, TSH and NS IVF bolus 500 cc ordered. NP to bedside. Subjective: Pt denies CP though admits he is "not doing to good".  Objective: Michael Keith is a 63 y/o gentleman w/ h/o squamous cell Ca of the cervical esophagus, s/p chemotherapy (carboplatin and taxanes) & XRT in July 2015, recently hospitalized 8/31-9/8 for MSSA bacteremia with removal of port and placement of PICC, acute on chronic respiratory failure with hypoxia, HCAP, AECOPD, chronic systolic CHF, cardiomyopathy with LVEF 15%, DM 2, acute on chronic renal failure and severe protein calorie malnutrition on tube feeding. Pt admitted 03/17/2014 by CCM after he presented to ED w/ acute dyspnea. Was negative for PE but with new spiculated mass versus modular pneumonia and metabolic acidosis with respiratory compensation (lactate 10.4) At bedside pt noted mildly tachypneac (RR- 24-30) w/ 02 sats of 94-98% on VM at 30%, but otherwise in NAD. Current BP 101/70. Afebrile. BBS w/ faint crackles to LLL. Consulted w/ Dr Hal Hope who is also at bedside and has also reviewed EKG. HR currently 167. Pt given Adenosine 6 mg IVP and immediately converted to SR w/ rate of 98. BP after Adenosine 117/69. Pt tolerated well. No significant changes in CXR or labs. TSH pending. Assessment/Plan: 1. Sinus tachycardia: In setting of critical illness in pt w/ chronic systolic CHF/non-ischemic cardiomyopathy. Resolved w/ IV Adenosine. HR currently 90's to low 100's. Will continue to monitor closely in SDU.  Jeryl Columbia, NP-C [facility] [place] [phone]

## 2014-03-31 NOTE — Progress Notes (Signed)
CARE MANAGEMENT NOTE 04/01/14  Patient:  Michael Keith, Michael Keith   Account Number:  [id number]  Date Initiated:  01-Apr-2014  Documentation initiated by:  Colvin Blatt  Subjective/Objective Assessment:   pt readmitted on 03/20/2014/ past medical history of esophageal cancer, status post PEG placement, history of COPD, chronic systolic CHF last 2D echo showing ejection fraction of 15%, recent admission for HCAP, MSSA bacteremia     Action/Plan:   from [facility]   Anticipated DC Date:  03/24/2014   Anticipated DC Plan:  SKILLED NURSING FACILITY  In-house referral  Clinical Social Worker      DC Planning Services  CM consult      [facility] Choice  NA   Choice offered to / List presented to:  NA   DME arranged  NA      DME agency  NA     [place] arranged  NA      [place] agency  NA   Status of service:  In process, will continue to follow Medicare Important Message given?   (If response is "NO", the following Medicare IM given date fields will be blank) Date Medicare IM given:   Medicare IM given by:   Date Additional Medicare IM given:   Additional Medicare IM given by:    Discharge Disposition:    Per UR Regulation:  Reviewed for med. necessity/level of care/duration of stay  If discussed at [place] of Stay Meetings, dates discussed:    Comments:  03/28/2014/Lilleigh Hechavarria,RN,BSN,CCM: Readmitted on [id number] with increased dyspnea and 02 sat down to 89%/03/14/2014/code blue event due to wide complex v-tach/started on iv amiodarone/will follow for changes.

## 2014-03-31 DEATH — deceased

## 2014-04-08 ENCOUNTER — Ambulatory Visit: Payer: Self-pay | Admitting: Radiation Oncology

## 2014-04-19 ENCOUNTER — Encounter: Payer: Self-pay | Admitting: *Deleted

## 2015-12-15 ENCOUNTER — Other Ambulatory Visit: Payer: Self-pay | Admitting: Nurse Practitioner

## 2016-03-15 IMAGING — CT NM PET TUM IMG INITIAL (PI) SKULL BASE T - THIGH
8 series · 19 of 25 positions shown · non-contrast
Comparison: None.

CLINICAL DATA: Initial treatment strategy for esophageal carcinoma.

EXAM:
NUCLEAR MEDICINE PET SKULL BASE TO THIGH
TECHNIQUE: 5.4 mCi F-18 FDG was injected intravenously. Full-ring PET imaging
was performed from the skull base to thigh after the radiotracer. CT
data was obtained and used for attenuation correction and anatomic
localization.
FASTING BLOOD GLUCOSE:  Value: 96 mg/dl

[Series 3: pet sk_thigh ac · axial · 5.0mm · 4.07mm/px · z∈[-988,-160]mm · 3 of 208 slices shown]
[im 1/208]
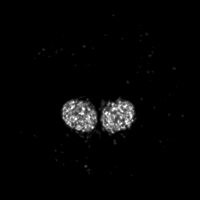
[im 70/208]
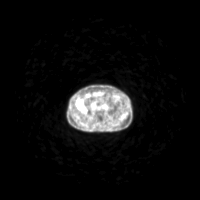
[im 208/208]
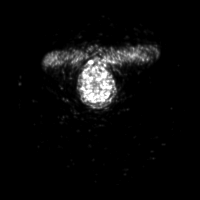

[Series 4: ct sk_thigh 5.0 b31f · axial · 5.0mm · 0.82mm/px · z∈[-988,-160]mm · 4 of 208 slices shown]
[im 1/208]
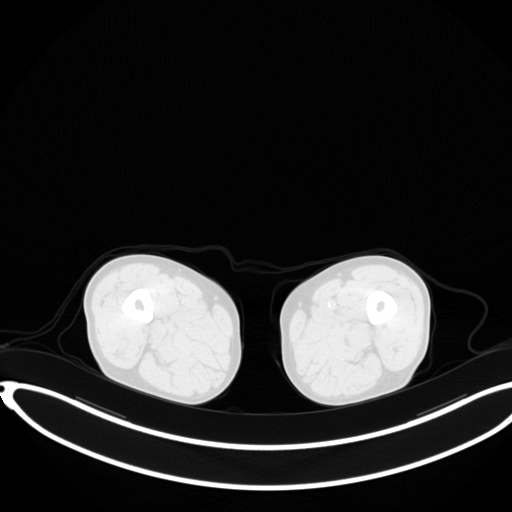
[im 52/208]
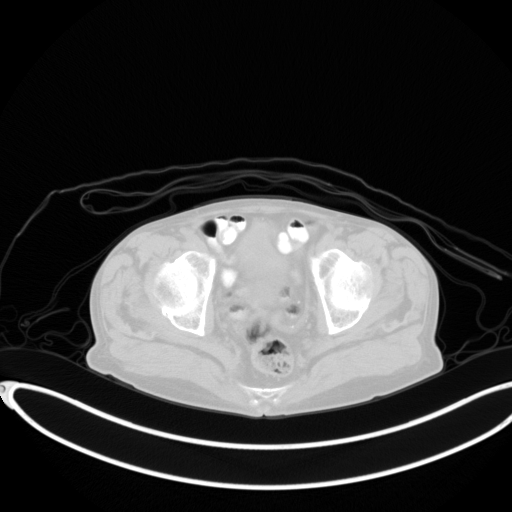
[im 156/208]
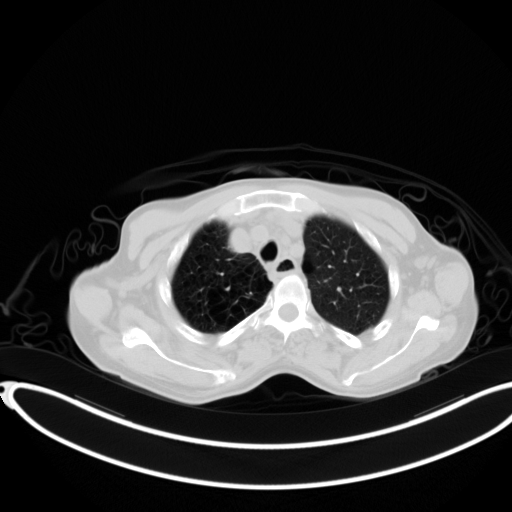
[im 208/208  brain]
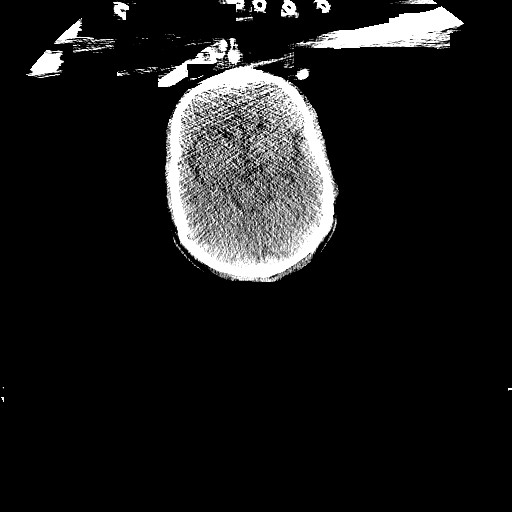

[Series 7: pet sk_thigh nac · axial · 5.0mm · 4.07mm/px · z∈[-988,-160]mm · 4 of 208 slices shown]
[im 1/208]
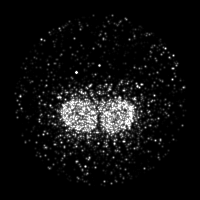
[im 104/208]
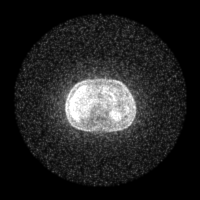
[im 156/208]
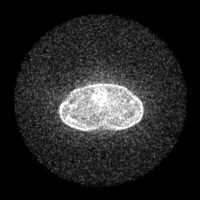
[im 208/208]
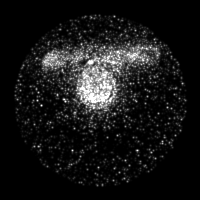

[Series 8: ct sk_thigh 5.0 b70f lung_bone · axial · 5.0mm · 0.62mm/px · 1 of 83 slices shown]
[im 83/83  bone]
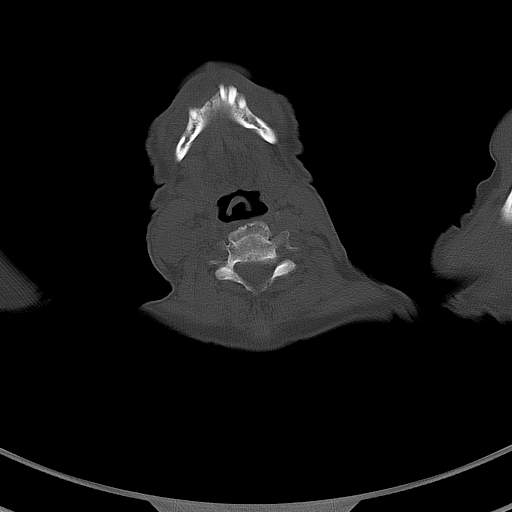

[Series 603: mip collection<mip range> · coronal · 1.72mm/px · 1 of 32 slices shown]
[im 1/32]
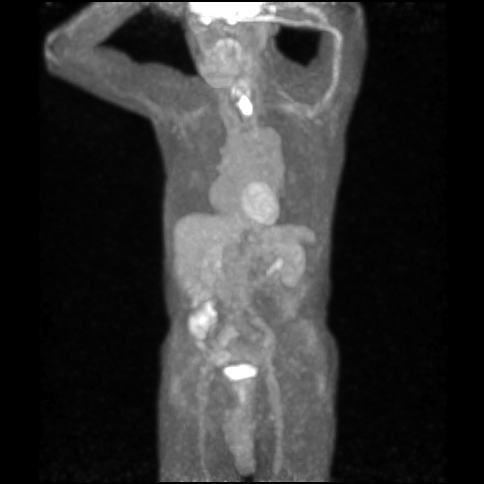

[Series 604: range-ct sk_thigh 5.0 (id)<alpha range> · 1 of 68 slices shown (1 of 2)]
[im 1/68]
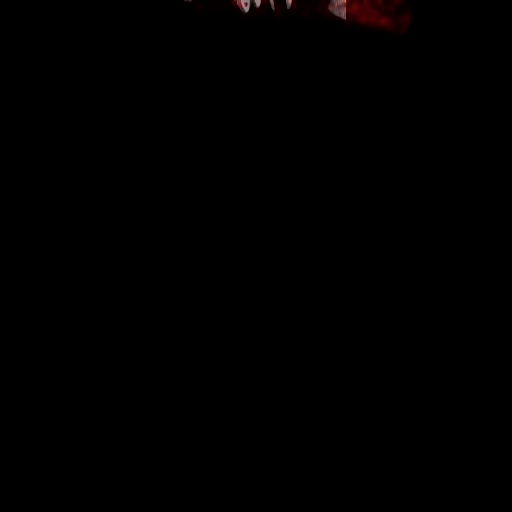

[Series 605: range-ct sk_thigh 5.0 (id)<alpha range> · 4 of 198 slices shown (2 of 2)]
[im 1/198]
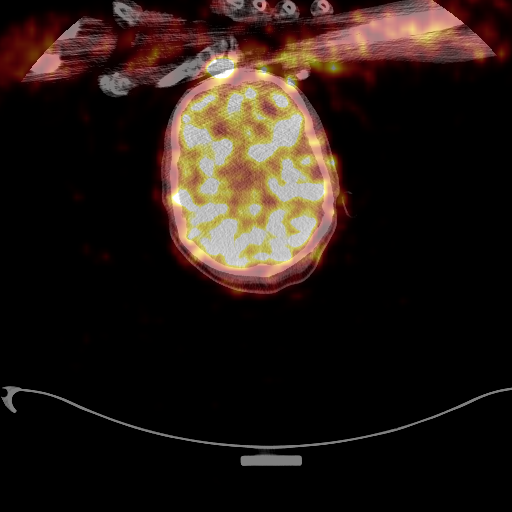
[im 50/198]
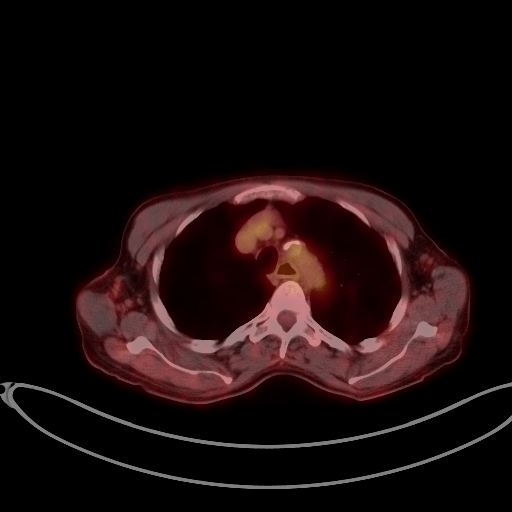
[im 99/198]
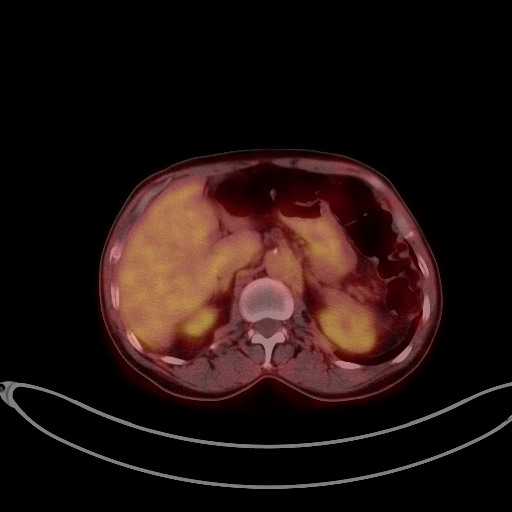
[im 198/198]
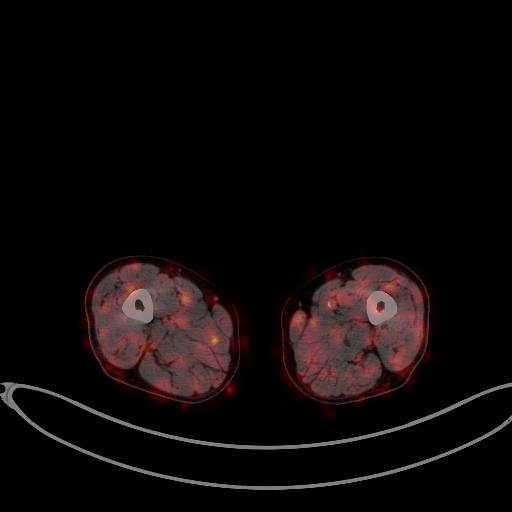

[Series 1032: results mm oncology reading · 0.34mm/px · 1 of 1 slices shown]
[im 1/1]
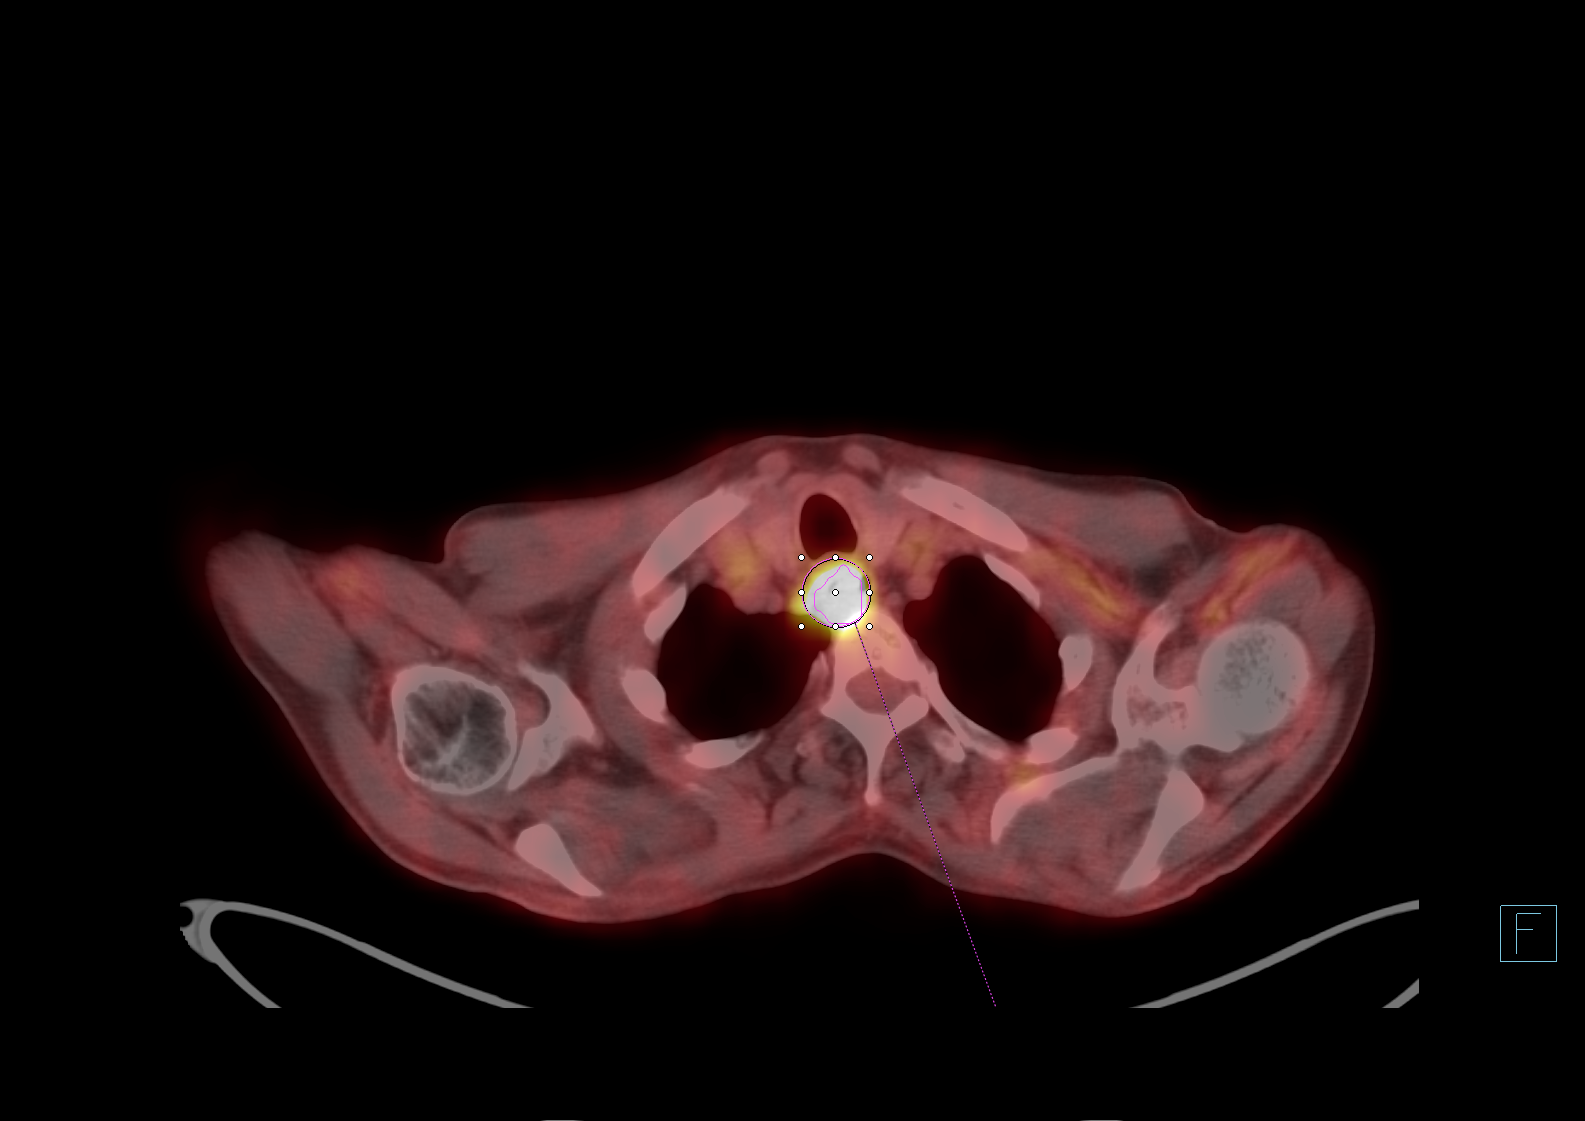

[19 of 25 positions shown; findings below may reference images not displayed]

FINDINGS: NECK

No hypermetabolic lymph nodes in the neck.

CHEST

Hypermetabolic mass involving the proximal portion of the thoracic
esophagus has an SUV max equal to 17.5. This measures approximately
1.7 x 2.1 x 3.6 cm. The heart size appears normal. No pericardial
effusion. There is calcified atherosclerotic disease involving the
abdominal aorta. No hypermetabolic mediastinal or hilar lymph nodes
identified. No hypermetabolic supraclavicular or hilar lymph nodes.

The moderate changes of centrilobular and paraseptal emphysema
noted. No hypermetabolic pulmonary nodule or mass noted.

ABDOMEN/PELVIS

No abnormal hypermetabolic activity within the liver, pancreas,
adrenal glands, or spleen. No hypermetabolic lymph nodes in the
abdomen or pelvis. Gallstones are noted. Calcified atherosclerotic
disease involves the abdominal aorta. There is moderate wall
thickening involving the urinary bladder. Mild prostate gland
enlargement noted.

SKELETON

No focal hypermetabolic activity to suggest skeletal metastasis.
IMPRESSION: 1. Proximal thoracic esophagus mass is identified exhibiting intense
FDG uptake compatible with primary esophageal malignancy.
2. No evidence for hypermetabolic lymph node metastasis or distant
metastatic disease.
3. Emphysema.
4. Atherosclerotic disease
5. Gallstones.
